# Patient Record
Sex: Female | Born: 1965
Health system: Southern US, Community
[De-identification: ages and names within clinical notes are randomized; demographics above are authoritative.]

## PROBLEM LIST (undated history)

## (undated) DIAGNOSIS — T8859XA Other complications of anesthesia, initial encounter: Secondary | ICD-10-CM

## (undated) DIAGNOSIS — F329 Major depressive disorder, single episode, unspecified: Secondary | ICD-10-CM

## (undated) DIAGNOSIS — F32A Depression, unspecified: Secondary | ICD-10-CM

## (undated) DIAGNOSIS — R519 Headache, unspecified: Secondary | ICD-10-CM

## (undated) DIAGNOSIS — I890 Lymphedema, not elsewhere classified: Secondary | ICD-10-CM

## (undated) DIAGNOSIS — J302 Other seasonal allergic rhinitis: Secondary | ICD-10-CM

## (undated) DIAGNOSIS — M1990 Unspecified osteoarthritis, unspecified site: Secondary | ICD-10-CM

## (undated) DIAGNOSIS — E119 Type 2 diabetes mellitus without complications: Secondary | ICD-10-CM

## (undated) DIAGNOSIS — T07XXXA Unspecified multiple injuries, initial encounter: Secondary | ICD-10-CM

## (undated) HISTORY — PX: TONSILLECTOMY: SUR1361

## (undated) HISTORY — PX: OTHER SURGICAL HISTORY: SHX169

## (undated) HISTORY — PX: WISDOM TOOTH EXTRACTION: SHX21

---

## 1996-06-22 HISTORY — PX: OTHER SURGICAL HISTORY: SHX169

## 1998-12-19 ENCOUNTER — Other Ambulatory Visit: Admission: RE | Admit: 1998-12-19 | Discharge: 1998-12-19 | Payer: Self-pay | Admitting: Internal Medicine

## 2001-03-16 ENCOUNTER — Other Ambulatory Visit: Admission: RE | Admit: 2001-03-16 | Discharge: 2001-03-16 | Payer: Self-pay | Admitting: *Deleted

## 2002-03-23 ENCOUNTER — Other Ambulatory Visit: Admission: RE | Admit: 2002-03-23 | Discharge: 2002-03-23 | Payer: Self-pay | Admitting: Obstetrics and Gynecology

## 2002-05-23 ENCOUNTER — Encounter: Payer: Self-pay | Admitting: Internal Medicine

## 2002-05-23 ENCOUNTER — Encounter: Admission: RE | Admit: 2002-05-23 | Discharge: 2002-05-23 | Payer: Self-pay | Admitting: Internal Medicine

## 2003-07-03 ENCOUNTER — Other Ambulatory Visit: Admission: RE | Admit: 2003-07-03 | Discharge: 2003-07-03 | Payer: Self-pay | Admitting: Obstetrics and Gynecology

## 2003-07-20 ENCOUNTER — Encounter: Admission: RE | Admit: 2003-07-20 | Discharge: 2003-07-20 | Payer: Self-pay | Admitting: Internal Medicine

## 2004-04-25 ENCOUNTER — Ambulatory Visit (HOSPITAL_COMMUNITY): Admission: RE | Admit: 2004-04-25 | Discharge: 2004-04-25 | Payer: Self-pay | Admitting: Surgery

## 2010-11-11 ENCOUNTER — Other Ambulatory Visit: Payer: Self-pay | Admitting: Obstetrics and Gynecology

## 2010-11-18 ENCOUNTER — Other Ambulatory Visit: Payer: Self-pay | Admitting: Obstetrics and Gynecology

## 2010-11-18 DIAGNOSIS — N938 Other specified abnormal uterine and vaginal bleeding: Secondary | ICD-10-CM

## 2010-11-18 DIAGNOSIS — D219 Benign neoplasm of connective and other soft tissue, unspecified: Secondary | ICD-10-CM

## 2010-11-22 ENCOUNTER — Other Ambulatory Visit: Payer: Self-pay

## 2010-11-25 ENCOUNTER — Ambulatory Visit
Admission: RE | Admit: 2010-11-25 | Discharge: 2010-11-25 | Disposition: A | Discharge status: Home / Self Care | Payer: 59 | Source: Ambulatory Visit | Attending: Obstetrics and Gynecology | Admitting: Obstetrics and Gynecology

## 2010-11-25 DIAGNOSIS — N938 Other specified abnormal uterine and vaginal bleeding: Secondary | ICD-10-CM

## 2010-11-25 DIAGNOSIS — D219 Benign neoplasm of connective and other soft tissue, unspecified: Secondary | ICD-10-CM

## 2010-11-25 MED ORDER — GADOBENATE DIMEGLUMINE 529 MG/ML IV SOLN
20.0000 mL | Freq: Once | INTRAVENOUS | Status: AC | PRN
  Administered 2010-11-25: 20 mL via INTRAVENOUS

## 2011-07-29 ENCOUNTER — Encounter (HOSPITAL_COMMUNITY): Payer: Self-pay | Admitting: Pharmacist

## 2011-07-31 ENCOUNTER — Other Ambulatory Visit: Payer: Self-pay | Admitting: Obstetrics and Gynecology

## 2011-08-04 ENCOUNTER — Encounter (HOSPITAL_COMMUNITY): Payer: Self-pay

## 2011-08-04 ENCOUNTER — Encounter (HOSPITAL_COMMUNITY)
Admission: RE | Admit: 2011-08-04 | Discharge: 2011-08-04 | Disposition: A | Discharge status: Home / Self Care | Payer: 59 | Source: Ambulatory Visit | Attending: Obstetrics and Gynecology | Admitting: Obstetrics and Gynecology

## 2011-08-04 HISTORY — DX: Unspecified osteoarthritis, unspecified site: M19.90

## 2011-08-04 HISTORY — DX: Major depressive disorder, single episode, unspecified: F32.9

## 2011-08-04 HISTORY — DX: Depression, unspecified: F32.A

## 2011-08-04 LAB — BASIC METABOLIC PANEL
BUN: 13 mg/dL (ref 6–23)
CO2: 29 mEq/L (ref 19–32)
Chloride: 102 mEq/L (ref 96–112)
Creatinine, Ser: 0.78 mg/dL (ref 0.50–1.10)
Glucose, Bld: 105 mg/dL — ABNORMAL HIGH (ref 70–99)

## 2011-08-04 LAB — CBC
HCT: 39.2 % (ref 36.0–46.0)
MCHC: 30.6 g/dL (ref 30.0–36.0)
MCV: 91 fL (ref 78.0–100.0)
RDW: 14.5 % (ref 11.5–15.5)

## 2011-08-04 NOTE — Patient Instructions (Addendum)
   Your procedure is scheduled on: Tuesday, Feb 19th  Enter through the [employer] of [facility] at: [employer] up the phone at the desk and dial [phone] and inform us of your arrival.  Please call this number if you have any problems the morning of surgery: [phone]  Remember: Do not eat food after midnight: Monday Do not drink clear liquids after: Monday Take these medicines the morning of surgery with a SIP OF WATER: None  Do not wear jewelry, make-up, or FINGER nail polish Do not wear lotions, powders, perfumes or deodorant. Do not shave 48 hours prior to surgery. Do not bring valuables to the hospital.  Leave suitcase in the car. After Surgery it may be brought to your room. For patients being admitted to the hospital, checkout time is 11:00am the day of discharge.  Home with Aunt Deggie Galbreath  cell [phone]  Patients discharged on the day of surgery will not be allowed to drive home.     Remember to use your hibiclens as instructed.Please shower with 1/2 bottle the evening before your surgery and the other 1/2 bottle the morning of surgery.

## 2011-08-04 NOTE — Anesthesia Preprocedure Evaluation (Signed)
Anesthesia Evaluation  Patient identified by MRN, date of birth, ID band Patient awake    Reviewed: Allergy & Precautions, H&P , NPO status , Patient's Chart, lab work & pertinent test results  Airway Mallampati: II TM Distance: >3 FB Neck ROM: full    Dental No notable dental hx. (+) Teeth Intact   Pulmonary neg pulmonary ROS,    Pulmonary exam normal       Cardiovascular neg cardio ROS     Neuro/Psych PSYCHIATRIC DISORDERS Depression Negative Neurological ROS     GI/Hepatic negative GI ROS, Neg liver ROS,   Endo/Other  Morbid obesity- STOP-BANG, No OSA  Renal/GU negative Renal ROS  Genitourinary negative   Musculoskeletal negative musculoskeletal ROS (+)   Abdominal (+) obese,   Peds negative pediatric ROS (+)  Hematology negative hematology ROS (+)   Anesthesia Other Findings   Reproductive/Obstetrics negative OB ROS                           Anesthesia Physical Anesthesia Plan  ASA: III  Anesthesia Plan: General   Post-op Pain Management:    Induction: Intravenous  Airway Management Planned: Oral ETT  Additional Equipment:   Intra-op Plan:   Post-operative Plan: Extubation in OR and Possible Post-op intubation/ventilation  Informed Consent: I have reviewed the patients History and Physical, chart, labs and discussed the procedure including the risks, benefits and alternatives for the proposed anesthesia with the patient or authorized representative who has indicated his/her understanding and acceptance.   Dental Advisory Given  Plan Discussed with: CRNA  Anesthesia Plan Comments: (1. Case was discussed with Dr. Malen Gauze. 2. Risk of postoperative visual loss and intubation was discussed with patient and APSF consensus conclusions on POVL was faxed to Dr. Cherly Hensen 3. Patient had been told by Dr. Cherly Hensen that her surgery may not able to be completed as contemplated. I reiterated  this by saying ventilation may not be possible given her weight.)        Anesthesia Quick Evaluation

## 2011-08-04 NOTE — Pre-Procedure Instructions (Signed)
Reviewed patient's history with Dr Arby Barrette (BMI 64).  Dr Arby Barrette to see patient.

## 2011-08-10 MED ORDER — CEFAZOLIN SODIUM-DEXTROSE 2-3 GM-% IV SOLR
2.0000 g | INTRAVENOUS | Status: AC
  Administered 2011-08-11: 2 g via INTRAVENOUS
  Filled 2011-08-10: qty 50

## 2011-08-11 ENCOUNTER — Encounter (HOSPITAL_COMMUNITY): Payer: Self-pay | Admitting: Anesthesiology

## 2011-08-11 ENCOUNTER — Encounter (HOSPITAL_COMMUNITY)
Admission: RE | Disposition: A | Discharge status: Home / Self Care | Payer: Self-pay | Source: Ambulatory Visit | Attending: Obstetrics and Gynecology

## 2011-08-11 ENCOUNTER — Ambulatory Visit (HOSPITAL_COMMUNITY)
Admission: RE | Admit: 2011-08-11 | Discharge: 2011-08-13 | Disposition: A | Discharge status: Home / Self Care | Payer: 59 | Source: Ambulatory Visit | Attending: Obstetrics and Gynecology | Admitting: Obstetrics and Gynecology

## 2011-08-11 ENCOUNTER — Other Ambulatory Visit: Payer: Self-pay | Admitting: Obstetrics and Gynecology

## 2011-08-11 ENCOUNTER — Inpatient Hospital Stay (HOSPITAL_COMMUNITY): Payer: 59 | Admitting: Anesthesiology

## 2011-08-11 ENCOUNTER — Encounter (HOSPITAL_COMMUNITY): Payer: Self-pay | Admitting: General Surgery

## 2011-08-11 DIAGNOSIS — Z90710 Acquired absence of both cervix and uterus: Secondary | ICD-10-CM

## 2011-08-11 DIAGNOSIS — M1610 Unilateral primary osteoarthritis, unspecified hip: Secondary | ICD-10-CM | POA: Insufficient documentation

## 2011-08-11 DIAGNOSIS — N920 Excessive and frequent menstruation with regular cycle: Secondary | ICD-10-CM | POA: Insufficient documentation

## 2011-08-11 DIAGNOSIS — D259 Leiomyoma of uterus, unspecified: Secondary | ICD-10-CM | POA: Insufficient documentation

## 2011-08-11 DIAGNOSIS — N83209 Unspecified ovarian cyst, unspecified side: Secondary | ICD-10-CM | POA: Insufficient documentation

## 2011-08-11 DIAGNOSIS — M1710 Unilateral primary osteoarthritis, unspecified knee: Secondary | ICD-10-CM | POA: Insufficient documentation

## 2011-08-11 DIAGNOSIS — Z01812 Encounter for preprocedural laboratory examination: Secondary | ICD-10-CM | POA: Insufficient documentation

## 2011-08-11 DIAGNOSIS — M79609 Pain in unspecified limb: Secondary | ICD-10-CM | POA: Insufficient documentation

## 2011-08-11 DIAGNOSIS — M7989 Other specified soft tissue disorders: Secondary | ICD-10-CM

## 2011-08-11 DIAGNOSIS — R209 Unspecified disturbances of skin sensation: Secondary | ICD-10-CM | POA: Insufficient documentation

## 2011-08-11 HISTORY — PX: OVARIAN CYST REMOVAL: SHX89

## 2011-08-11 SURGERY — ROBOTIC ASSISTED TOTAL HYSTERECTOMY
Anesthesia: General | Site: Abdomen | Wound class: Clean Contaminated

## 2011-08-11 MED ORDER — SODIUM CHLORIDE 0.9 % IJ SOLN
INTRAMUSCULAR | Status: DC | PRN
  Administered 2011-08-11: 10 mL via INTRAVENOUS

## 2011-08-11 MED ORDER — DIAZEPAM 5 MG/ML IJ SOLN
5.0000 mg | Freq: Once | INTRAMUSCULAR | Status: AC
  Administered 2011-08-11: 2.5 mg via INTRAVENOUS

## 2011-08-11 MED ORDER — KETOROLAC TROMETHAMINE 30 MG/ML IJ SOLN
INTRAMUSCULAR | Status: DC | PRN
  Administered 2011-08-11: 30 mg via INTRAMUSCULAR

## 2011-08-11 MED ORDER — MIDAZOLAM HCL 5 MG/5ML IJ SOLN
INTRAMUSCULAR | Status: DC | PRN
  Administered 2011-08-11: 2 mg via INTRAVENOUS

## 2011-08-11 MED ORDER — ONDANSETRON HCL 4 MG/2ML IJ SOLN: 4.0000 mg | Freq: Four times a day (QID) | INTRAMUSCULAR | Status: DC | PRN

## 2011-08-11 MED ORDER — DEXTROSE IN LACTATED RINGERS 5 % IV SOLN
INTRAVENOUS | Status: DC
  Administered 2011-08-12: 04:00:00 via INTRAVENOUS

## 2011-08-11 MED ORDER — METOCLOPRAMIDE HCL 5 MG/ML IJ SOLN: 10.0000 mg | Freq: Once | INTRAMUSCULAR | Status: DC | PRN

## 2011-08-11 MED ORDER — LIDOCAINE HCL (CARDIAC) 20 MG/ML IV SOLN
INTRAVENOUS | Status: DC | PRN
  Administered 2011-08-11: 100 mg via INTRAVENOUS

## 2011-08-11 MED ORDER — ROCURONIUM BROMIDE 50 MG/5ML IV SOLN
INTRAVENOUS | Status: AC
  Filled 2011-08-11: qty 2

## 2011-08-11 MED ORDER — PROPOFOL 10 MG/ML IV EMUL
INTRAVENOUS | Status: DC | PRN
  Administered 2011-08-11: 100 mg via INTRAVENOUS
  Administered 2011-08-11: 300 mg via INTRAVENOUS

## 2011-08-11 MED ORDER — HYDROMORPHONE HCL PF 1 MG/ML IJ SOLN
INTRAMUSCULAR | Status: AC
  Administered 2011-08-11: 0.5 mg via INTRAVENOUS
  Filled 2011-08-11: qty 1

## 2011-08-11 MED ORDER — ONDANSETRON HCL 4 MG/2ML IJ SOLN
INTRAMUSCULAR | Status: AC
  Filled 2011-08-11: qty 2

## 2011-08-11 MED ORDER — ONDANSETRON HCL 4 MG PO TABS: 4.0000 mg | ORAL_TABLET | Freq: Four times a day (QID) | ORAL | Status: DC | PRN

## 2011-08-11 MED ORDER — KETOROLAC TROMETHAMINE 30 MG/ML IJ SOLN: 30.0000 mg | Freq: Four times a day (QID) | INTRAMUSCULAR | Status: DC

## 2011-08-11 MED ORDER — PANTOPRAZOLE SODIUM 40 MG PO TBEC
40.0000 mg | DELAYED_RELEASE_TABLET | Freq: Every day | ORAL | Status: DC
  Filled 2011-08-11 (×3): qty 1

## 2011-08-11 MED ORDER — FENTANYL CITRATE 0.05 MG/ML IJ SOLN
INTRAMUSCULAR | Status: AC
  Administered 2011-08-11: 50 ug via INTRAVENOUS
  Filled 2011-08-11: qty 2

## 2011-08-11 MED ORDER — HYDROMORPHONE 0.3 MG/ML IV SOLN
INTRAVENOUS | Status: DC
  Administered 2011-08-11: 1.79 mg via INTRAVENOUS
  Administered 2011-08-11: 15:00:00 via INTRAVENOUS
  Administered 2011-08-12: 3.19 mg via INTRAVENOUS
  Administered 2011-08-12: 2.59 mg via INTRAVENOUS

## 2011-08-11 MED ORDER — FENTANYL CITRATE 0.05 MG/ML IJ SOLN
INTRAMUSCULAR | Status: AC
  Filled 2011-08-11: qty 2

## 2011-08-11 MED ORDER — ONDANSETRON HCL 4 MG/2ML IJ SOLN
INTRAMUSCULAR | Status: DC | PRN
  Administered 2011-08-11: 4 mg via INTRAVENOUS

## 2011-08-11 MED ORDER — MIDAZOLAM HCL 2 MG/2ML IJ SOLN: 0.5000 mg | INTRAMUSCULAR | Status: DC | PRN

## 2011-08-11 MED ORDER — HYDROMORPHONE HCL PF 1 MG/ML IJ SOLN: 0.2000 mg | INTRAMUSCULAR | Status: DC | PRN

## 2011-08-11 MED ORDER — DIPHENHYDRAMINE HCL 50 MG/ML IJ SOLN: 12.5000 mg | Freq: Four times a day (QID) | INTRAMUSCULAR | Status: DC | PRN

## 2011-08-11 MED ORDER — ZOLPIDEM TARTRATE 5 MG PO TABS: 5.0000 mg | ORAL_TABLET | Freq: Every evening | ORAL | Status: DC | PRN

## 2011-08-11 MED ORDER — IBUPROFEN 800 MG PO TABS
800.0000 mg | ORAL_TABLET | Freq: Three times a day (TID) | ORAL | Status: DC | PRN
  Administered 2011-08-12: 800 mg via ORAL
  Filled 2011-08-11: qty 1

## 2011-08-11 MED ORDER — CEFAZOLIN SODIUM 1-5 GM-% IV SOLN
1.0000 g | Freq: Three times a day (TID) | INTRAVENOUS | Status: AC
  Administered 2011-08-11 (×2): 1 g via INTRAVENOUS
  Filled 2011-08-11 (×2): qty 50

## 2011-08-11 MED ORDER — FENTANYL CITRATE 0.05 MG/ML IJ SOLN
INTRAMUSCULAR | Status: AC
  Filled 2011-08-11: qty 5

## 2011-08-11 MED ORDER — KETOROLAC TROMETHAMINE 30 MG/ML IJ SOLN
INTRAMUSCULAR | Status: AC
  Filled 2011-08-11: qty 2

## 2011-08-11 MED ORDER — PHENYLEPHRINE HCL 10 MG/ML IJ SOLN
INTRAMUSCULAR | Status: DC | PRN
  Administered 2011-08-11: .1 mg via INTRAVENOUS
  Administered 2011-08-11: 0.2 mg via INTRAVENOUS

## 2011-08-11 MED ORDER — DIPHENHYDRAMINE HCL 12.5 MG/5ML PO ELIX: 12.5000 mg | ORAL_SOLUTION | Freq: Four times a day (QID) | ORAL | Status: DC | PRN

## 2011-08-11 MED ORDER — NEOSTIGMINE METHYLSULFATE 1 MG/ML IJ SOLN
INTRAMUSCULAR | Status: AC
  Filled 2011-08-11: qty 10

## 2011-08-11 MED ORDER — DEXAMETHASONE SODIUM PHOSPHATE 4 MG/ML IJ SOLN
INTRAMUSCULAR | Status: DC | PRN
  Administered 2011-08-11: 10 mg via INTRAVENOUS

## 2011-08-11 MED ORDER — MIDAZOLAM HCL 2 MG/2ML IJ SOLN
INTRAMUSCULAR | Status: AC
  Filled 2011-08-11: qty 2

## 2011-08-11 MED ORDER — BUPIVACAINE HCL (PF) 0.25 % IJ SOLN
INTRAMUSCULAR | Status: DC | PRN
  Administered 2011-08-11: 20 mL

## 2011-08-11 MED ORDER — DEXAMETHASONE SODIUM PHOSPHATE 10 MG/ML IJ SOLN
INTRAMUSCULAR | Status: AC
  Filled 2011-08-11: qty 1

## 2011-08-11 MED ORDER — NALOXONE HCL 0.4 MG/ML IJ SOLN: 0.4000 mg | INTRAMUSCULAR | Status: DC | PRN

## 2011-08-11 MED ORDER — GLYCOPYRROLATE 0.2 MG/ML IJ SOLN
INTRAMUSCULAR | Status: DC | PRN
  Administered 2011-08-11: .8 mg via INTRAVENOUS

## 2011-08-11 MED ORDER — GLYCOPYRROLATE 0.2 MG/ML IJ SOLN
INTRAMUSCULAR | Status: AC
  Filled 2011-08-11: qty 1

## 2011-08-11 MED ORDER — FAMOTIDINE 20 MG PO TABS
ORAL_TABLET | ORAL | Status: AC
  Filled 2011-08-11: qty 1

## 2011-08-11 MED ORDER — MENTHOL 3 MG MT LOZG: 1.0000 | LOZENGE | OROMUCOSAL | Status: DC | PRN

## 2011-08-11 MED ORDER — KETOROLAC TROMETHAMINE 30 MG/ML IJ SOLN
30.0000 mg | Freq: Four times a day (QID) | INTRAMUSCULAR | Status: DC
  Administered 2011-08-11 – 2011-08-12 (×2): 30 mg via INTRAVENOUS
  Filled 2011-08-11 (×2): qty 1

## 2011-08-11 MED ORDER — FAMOTIDINE 20 MG PO TABS
20.0000 mg | ORAL_TABLET | Freq: Once | ORAL | Status: AC
  Administered 2011-08-11: 20 mg via ORAL

## 2011-08-11 MED ORDER — FENTANYL CITRATE 0.05 MG/ML IJ SOLN
25.0000 ug | INTRAMUSCULAR | Status: DC | PRN
  Administered 2011-08-11 (×5): 50 ug via INTRAVENOUS

## 2011-08-11 MED ORDER — FENTANYL CITRATE 0.05 MG/ML IJ SOLN
INTRAMUSCULAR | Status: DC | PRN
  Administered 2011-08-11 (×3): 50 ug via INTRAVENOUS
  Administered 2011-08-11: 150 ug via INTRAVENOUS
  Administered 2011-08-11 (×2): 50 ug via INTRAVENOUS

## 2011-08-11 MED ORDER — HYDROMORPHONE HCL PF 1 MG/ML IJ SOLN
0.2500 mg | INTRAMUSCULAR | Status: DC | PRN
  Administered 2011-08-11 (×4): 0.5 mg via INTRAVENOUS

## 2011-08-11 MED ORDER — ROCURONIUM BROMIDE 100 MG/10ML IV SOLN
INTRAVENOUS | Status: DC | PRN
  Administered 2011-08-11: 20 mg via INTRAVENOUS
  Administered 2011-08-11: 60 mg via INTRAVENOUS

## 2011-08-11 MED ORDER — LACTATED RINGERS IV SOLN
INTRAVENOUS | Status: DC
  Administered 2011-08-11: 50 mL/h via INTRAVENOUS

## 2011-08-11 MED ORDER — LACTATED RINGERS IV SOLN
INTRAVENOUS | Status: DC | PRN
  Administered 2011-08-11: 07:00:00 via INTRAVENOUS

## 2011-08-11 MED ORDER — SODIUM CHLORIDE 0.9 % IJ SOLN: 9.0000 mL | INTRAMUSCULAR | Status: DC | PRN

## 2011-08-11 MED ORDER — NEOSTIGMINE METHYLSULFATE 1 MG/ML IJ SOLN
INTRAMUSCULAR | Status: DC | PRN
  Administered 2011-08-11: 4 mg via INTRAVENOUS

## 2011-08-11 MED ORDER — BUPIVACAINE HCL (PF) 0.25 % IJ SOLN
INTRAMUSCULAR | Status: AC
  Filled 2011-08-11: qty 30

## 2011-08-11 MED ORDER — EPHEDRINE 5 MG/ML INJ
INTRAVENOUS | Status: AC
  Filled 2011-08-11: qty 10

## 2011-08-11 MED ORDER — OXYCODONE-ACETAMINOPHEN 5-325 MG PO TABS
1.0000 | ORAL_TABLET | ORAL | Status: DC | PRN
  Administered 2011-08-12 – 2011-08-13 (×5): 2 via ORAL
  Filled 2011-08-11 (×5): qty 2

## 2011-08-11 MED ORDER — PHENYLEPHRINE 40 MCG/ML (10ML) SYRINGE FOR IV PUSH (FOR BLOOD PRESSURE SUPPORT)
PREFILLED_SYRINGE | INTRAVENOUS | Status: AC
  Filled 2011-08-11: qty 10

## 2011-08-11 MED ORDER — LIDOCAINE HCL (CARDIAC) 20 MG/ML IV SOLN
INTRAVENOUS | Status: AC
  Filled 2011-08-11: qty 5

## 2011-08-11 MED ORDER — HYDROMORPHONE 0.3 MG/ML IV SOLN
INTRAVENOUS | Status: AC
  Filled 2011-08-11: qty 25

## 2011-08-11 MED ORDER — EPHEDRINE SULFATE 50 MG/ML IJ SOLN
INTRAMUSCULAR | Status: DC | PRN
  Administered 2011-08-11: 20 mg via INTRAVENOUS
  Administered 2011-08-11: 10 mg via INTRAVENOUS

## 2011-08-11 MED ORDER — LACTATED RINGERS IR SOLN
Status: DC | PRN
  Administered 2011-08-11: 3000 mL

## 2011-08-11 MED ORDER — ACETAMINOPHEN 10 MG/ML IV SOLN
1000.0000 mg | Freq: Once | INTRAVENOUS | Status: AC
  Administered 2011-08-11: 1000 mg via INTRAVENOUS
  Filled 2011-08-11: qty 100

## 2011-08-11 SURGICAL SUPPLY — 65 items
ADH SKN CLS APL DERMABOND .7 (GAUZE/BANDAGES/DRESSINGS) ×8
BAG URINE DRAINAGE (UROLOGICAL SUPPLIES) ×5 IMPLANT
BARRIER ADHS 3X4 INTERCEED (GAUZE/BANDAGES/DRESSINGS) ×5 IMPLANT
BLADE LAP MORCELLATOR 15X9.5 (ELECTROSURGICAL) ×2 IMPLANT
BRR ADH 4X3 ABS CNTRL BYND (GAUZE/BANDAGES/DRESSINGS) ×4
CABLE HIGH FREQUENCY MONO STRZ (ELECTRODE) ×5 IMPLANT
CATH FOLEY 3WAY  5CC 16FR (CATHETERS) ×1
CATH FOLEY 3WAY 5CC 16FR (CATHETERS) ×4 IMPLANT
CHLORAPREP W/TINT 26ML (MISCELLANEOUS) ×7 IMPLANT
CLOTH BEACON ORANGE TIMEOUT ST (SAFETY) ×5 IMPLANT
CONT PATH 16OZ SNAP LID 3702 (MISCELLANEOUS) ×5 IMPLANT
COVER MAYO STAND STRL (DRAPES) ×5 IMPLANT
COVER TABLE BACK 60X90 (DRAPES) ×10 IMPLANT
COVER TIP SHEARS 8 DVNC (MISCELLANEOUS) ×4 IMPLANT
COVER TIP SHEARS 8MM DA VINCI (MISCELLANEOUS) ×1
DECANTER SPIKE VIAL GLASS SM (MISCELLANEOUS) ×5 IMPLANT
DERMABOND ADVANCED (GAUZE/BANDAGES/DRESSINGS) ×2
DERMABOND ADVANCED .7 DNX12 (GAUZE/BANDAGES/DRESSINGS) ×5 IMPLANT
DRAPE HUG U DISPOSABLE (DRAPE) ×5 IMPLANT
DRAPE LG THREE QUARTER DISP (DRAPES) ×10 IMPLANT
DRAPE MONITOR DA VINCI (DRAPE) IMPLANT
DRAPE WARM FLUID 44X44 (DRAPE) ×5 IMPLANT
DRESSING TELFA 8X3 (GAUZE/BANDAGES/DRESSINGS) ×2 IMPLANT
ELECT REM PT RETURN 9FT ADLT (ELECTROSURGICAL) ×5
ELECTRODE REM PT RTRN 9FT ADLT (ELECTROSURGICAL) ×4 IMPLANT
EVACUATOR SMOKE 8.L (FILTER) ×5 IMPLANT
GAUZE VASELINE 3X9 (GAUZE/BANDAGES/DRESSINGS) IMPLANT
GLOVE BIO SURGEON STRL SZ 6.5 (GLOVE) ×10 IMPLANT
GLOVE BIOGEL PI IND STRL 7.0 (GLOVE) ×12 IMPLANT
GLOVE BIOGEL PI INDICATOR 7.0 (GLOVE) ×3
GOWN STRL REIN XL XLG (GOWN DISPOSABLE) ×30 IMPLANT
KIT ACCESSORY DA VINCI DISP (KITS) ×1
KIT ACCESSORY DVNC DISP (KITS) ×4 IMPLANT
KIT DISP ACCESSORY 4 ARM (KITS) IMPLANT
NDL INSUFFLATION 14GA 120MM (NEEDLE) ×3 IMPLANT
NEEDLE INSUFFLATION 14GA 120MM (NEEDLE) ×5 IMPLANT
NS IRRIG 1000ML POUR BTL (IV SOLUTION) ×15 IMPLANT
OCCLUDER COLPOPNEUMO (BALLOONS) ×2 IMPLANT
PACK LAVH (CUSTOM PROCEDURE TRAY) ×5 IMPLANT
PAD PREP 24X48 CUFFED NSTRL (MISCELLANEOUS) ×10 IMPLANT
PLUG CATH AND CAP STER (CATHETERS) ×5 IMPLANT
PROTECTOR NERVE ULNAR (MISCELLANEOUS) ×12 IMPLANT
SCISSORS LAP 5X35 DISP (ENDOMECHANICALS) IMPLANT
SET IRRIG TUBING LAPAROSCOPIC (IRRIGATION / IRRIGATOR) ×5 IMPLANT
SOLUTION ELECTROLUBE (MISCELLANEOUS) ×5 IMPLANT
SPONGE LAP 18X18 X RAY DECT (DISPOSABLE) ×2 IMPLANT
SUT VIC AB 0 CT1 27 (SUTURE) ×70
SUT VIC AB 0 CT1 27XBRD ANTBC (SUTURE) ×29 IMPLANT
SUT VICRYL 0 UR6 27IN ABS (SUTURE) ×7 IMPLANT
SUT VICRYL 4-0 PS2 18IN ABS (SUTURE) ×10 IMPLANT
SYR 50ML LL SCALE MARK (SYRINGE) ×5 IMPLANT
SYSTEM CONVERTIBLE TROCAR (TROCAR) IMPLANT
TIP UTERINE 5.1X6CM LAV DISP (MISCELLANEOUS) IMPLANT
TIP UTERINE 6.7X10CM GRN DISP (MISCELLANEOUS) IMPLANT
TIP UTERINE 6.7X6CM WHT DISP (MISCELLANEOUS) IMPLANT
TIP UTERINE 6.7X8CM BLUE DISP (MISCELLANEOUS) ×2 IMPLANT
TOWEL OR 17X24 6PK STRL BLUE (TOWEL DISPOSABLE) ×15 IMPLANT
TRAY FOLEY BAG SILVER LF 14FR (CATHETERS) ×5 IMPLANT
TROCAR 12M 150ML BLUNT (TROCAR) ×2 IMPLANT
TROCAR DISP BLADELESS 8 DVNC (TROCAR) ×4 IMPLANT
TROCAR DISP BLADELESS 8MM (TROCAR) ×1
TROCAR Z-THREAD 12X150 (TROCAR) ×5 IMPLANT
TROCAR Z-THREAD BLADED 12X100M (TROCAR) ×5 IMPLANT
TUBING FILTER THERMOFLATOR (ELECTROSURGICAL) ×5 IMPLANT
WARMER LAPAROSCOPE (MISCELLANEOUS) ×5 IMPLANT

## 2011-08-11 NOTE — Progress Notes (Signed)
Pt has eaten C/o numbness of both feet and legs sore. Dopplers normal  O: morbidly obese BF in NAD Foley : drainage of blood(light) urine noted.. Yellow urine at end of case  Intraop findings reviewed  IMP: Postop Davinci robotic hysterectomy Numbness of feet expect transient P) monitor urine closely. Await BMET in am. Will do retrograde eval if necessary. Reg postop care

## 2011-08-11 NOTE — Transfer of Care (Signed)
Immediate Anesthesia Transfer of Care Note  Patient: [name]  Procedure(s) Performed: Procedure(s) (LRB): ROBOTIC ASSISTED TOTAL HYSTERECTOMY (N/A) OVARIAN CYSTECTOMY (Left) BILATERAL SALPINGECTOMY (Bilateral)  Patient Location: PACU  Anesthesia Type: General  Level of Consciousness: awake  Airway & Oxygen Therapy: Patient Spontanous Breathing and Patient connected to nasal cannula oxygen  Post-op Assessment: Report given to PACU RN and Patient moving all extremities  Post vital signs: Reviewed and stable  Complications: No apparent anesthesia complications

## 2011-08-11 NOTE — Brief Op Note (Signed)
08/11/2011  12:17 PM  PATIENT:  [name]  46 y.o. female  PRE-OPERATIVE DIAGNOSIS:  Dysfunctional Uterine Bleeding, Fibroid Uterus, Morbid Obesity  POST-OPERATIVE DIAGNOSIS:  Dysfunctional Uterine Bleeding, Fibroid Uterus, Morbid Obesity  PROCEDURE:  Procedure(s) (LRB):DAVINCI ROBOTIC ASSISTED TOTAL HYSTERECTOMY (N/A)LEFT OVARIAN CYSTECTOMY (Left) BILATERAL SALPINGECTOMY (Bilateral)  SURGEON:  Surgeon(s) and Role:    * Serita Kyle, MD - Primary      PHYSICIAN ASSISTANT:   ASSISTANTS: Shea Evans ANESTHESIA:   general FINDINGS:  NL URETERS PERISTALSING BILATERALLY, FIBROID UTERUS, NL APPENDIX, NL LIVER EDGE, NL TUBES BILAT, LEFT OV CYST, NL RIGHT OV  EBL:  Total I/O In: 400 [I.V.:400] Out: 75 [Urine:75]  BLOOD ADMINISTERED:none  DRAINS: none   LOCAL MEDICATIONS USED:  MARCAINE     SPECIMEN:  Source of Specimen:  UTERUS W/ BILATERAL FALLOPIAN TUBES, LEFT OVARIAN CYST  WALL  DISPOSITION OF SPECIMEN:  PATHOLOGY  COUNTS:  YES  TOURNIQUET:  * No tourniquets in log *  DICTATION: .Other Dictation: Dictation Number  [id number] PLAN OF CARE:   PATIENT DISPOSITION:  PACU - hemodynamically stable.   Delay start of Pharmacological VTE agent (>24hrs) due to surgical blood loss or risk of bleeding: no

## 2011-08-11 NOTE — Progress Notes (Signed)
Bilateral lower extremity venous duplex completed.  Preliminary report is negative for DVT or SVT in the vessels visualized.  Negative for a Baker's cyst.  Technically difficult study due to the patient's body habitus.

## 2011-08-11 NOTE — Anesthesia Postprocedure Evaluation (Signed)
  Anesthesia Post-op Note  Patient: [name]  Procedure(s) Performed: Procedure(s) (LRB): ROBOTIC ASSISTED TOTAL HYSTERECTOMY (N/A) OVARIAN CYSTECTOMY (Left) BILATERAL SALPINGECTOMY (Bilateral)  Patient Location: PACU  Anesthesia Type: General  Level of Consciousness: awake, alert  and oriented  Airway and Oxygen Therapy: Patient Spontanous Breathing  Post-op Pain: mild. Primary complaint has been bilateral calf pain.  Post-op Assessment: Post-op Vital signs reviewed, Patient's Cardiovascular Status Stable, Respiratory Function Stable, Patent Airway, No signs of Nausea or vomiting and Pain level controlled  Post-op Vital Signs: Reviewed and stable  Complications: No apparent anesthesia complications. Bilateral calf pain in PACU. Doppler study preliminary report negative. Could possibly be related to lower back disorder or surgical procedure.

## 2011-08-11 NOTE — Progress Notes (Signed)
Dr. Malen Gauze made aware of pts pain level and bp elevation, orders received for additional medication.  Dopplers performed and results negative per Dois Davenport- vascular sonographer

## 2011-08-12 ENCOUNTER — Encounter (HOSPITAL_COMMUNITY): Payer: Self-pay | Admitting: Obstetrics and Gynecology

## 2011-08-12 LAB — BASIC METABOLIC PANEL
GFR calc Af Amer: 86 mL/min — ABNORMAL LOW (ref >90)
GFR calc non Af Amer: 74 mL/min — ABNORMAL LOW (ref >90)
Potassium: 4.3 mEq/L (ref 3.5–5.1)
Sodium: 136 mEq/L (ref 135–145)

## 2011-08-12 LAB — CBC
Hemoglobin: 12 g/dL (ref 12.0–15.0)
MCHC: 30.9 g/dL (ref 30.0–36.0)
Platelets: 241 10*3/uL (ref 150–400)
RDW: 15 % (ref 11.5–15.5)

## 2011-08-12 MED ORDER — HYDROCODONE-ACETAMINOPHEN 5-500 MG PO TABS: 1.0000 | ORAL_TABLET | ORAL | Status: DC | PRN

## 2011-08-12 MED ORDER — LACTATED RINGERS IV BOLUS (SEPSIS)
1000.0000 mL | Freq: Once | INTRAVENOUS | Status: AC
  Administered 2011-08-12: 1000 mL via INTRAVENOUS

## 2011-08-12 MED ORDER — IBUPROFEN 800 MG PO TABS: 800.0000 mg | ORAL_TABLET | Freq: Three times a day (TID) | ORAL | Status: AC | PRN

## 2011-08-12 NOTE — Progress Notes (Signed)
Spoke to rehab at [facility], states they will not be able to see patient today. Notified MD. Charge nurse notified cmgmt to see if pt can get a walker for home today. Will f/u

## 2011-08-12 NOTE — Discharge Instructions (Signed)
Call if temperature greater than equal to 100.4, nothing per vagina for 4-6 weeks or severe nausea vomiting, increased incisional pain , drainage or redness in the incision site, no straining with bowel movements, showers no bath °

## 2011-08-12 NOTE — Consult Note (Signed)
[facility] CONSULT [facility]     Reason for Consult: bilateral feet numbness    HPI:    [name] is an 46 y.o. female who recently underwent a THOCBS. Per OP note, prior to operation patient was complaining of bilateral calf pain.  Bilateral LE dopplers showed negative for DVT.  Post op day 1 patient continued to complain of bilateral foot tingling. Her paresthesia is  Subjectively located over the bottom of her feet bilaterally however, objectively during exam she shows a sensory S1 (lateralfoot and small toe only) with preserved achilles reflex.  Past Medical History  Diagnosis Date  . Arthritis     knees, hips, tx with otc med  . Depression     History in 2004, no current prob, no meds    Past Surgical History  Procedure Date  . Excision of 4 areas of hidradenitis in the right axilla  1986 x 04/25/2004    x 2  . Tonsillectomy     as 46 yrs old  . Arthroscopic knee 1998    right knee  . Wisdom tooth extraction     History reviewed. No pertinent family history.  Social History:  reports that she has been passively smoking Cigars.  She has never used smokeless tobacco. She reports that she drinks alcohol. She reports that she does not use illicit drugs.  No Known Allergies  Medications:    Prior to Admission:  Prescriptions prior to admission  Medication Sig Dispense Refill  . DISCONTD: HYDROcodone-acetaminophen (VICODIN) 5-500 MG per tablet Take 1 tablet by mouth every 4 (four) hours as needed.      Marland Kitchen DISCONTD: ibuprofen (ADVIL,MOTRIN) 200 MG tablet Take 1,200 mg by mouth 2 (two) times daily. For pain.      Marland Kitchen DISCONTD: levonorgestrel-ethinyl estradiol (AVIANE,ALESSE,LESSINA) 0.1-20 MG-MCG tablet Take 1 tablet by mouth daily.      Marland Kitchen DISCONTD: Menthol, Topical Analgesic, (STOPAIN EX) Apply 1 spray topically daily. For Knee Pain.       Scheduled:   .  ceFAZolin (ANCEF) IV  1 g Intravenous Q8H  . diazepam  5 mg Intravenous Once  .  famotidine      . HYDROmorphone PCA 0.3 mg/mL      . ketorolac  30 mg Intravenous Q6H   Or  . ketorolac  30 mg Intramuscular Q6H  . pantoprazole  40 mg Oral Q1200  . DISCONTD: HYDROmorphone PCA 0.3 mg/mL   Intravenous Q4H    Review of Systems - General ROS: negative for - chills, fatigue, fever or hot flashes Hematological and Lymphatic ROS: negative for - bruising, fatigue, jaundice or pallor Endocrine ROS: negative for - hair pattern changes, hot flashes, mood swings or skin changes Respiratory ROS: negative for - cough, hemoptysis, orthopnea or wheezing Cardiovascular ROS: negative for - dyspnea on exertion, orthopnea, palpitations or shortness of breath Gastrointestinal ROS: negative for - abdominal pain, appetite loss, blood in stools, diarrhea or hematemesis Musculoskeletal ROS: negative for - joint pain, joint stiffness, joint swelling or muscle pain Neurological ROS: positive for - numbness/tingling bilateral feet Dermatological ROS: negative for dry skin, pruritus and rash   Blood pressure 125/84, pulse 100, temperature 98.2 F (36.8 C), temperature source Oral, resp. rate 20, height 5\' 10"  (1.778 m), weight 202.304 kg (446 lb), SpO2 95.00%.   Neurologic Examination:   Mental Status: Alert, oriented, thought content appropriate.  Speech fluent without evidence of aphasia.  Able to follow 3 step commands without difficulty. Cranial Nerves: II: visual fields grossly normal, pupils equal, round, reactive to light and accommodation III,IV, VI: ptosis not present, extraocular muscles extra-ocular motions intact bilaterally V,VII: smile symmetric, facial light touch sensation normal bilaterally VIII: hearing normal bilaterally IX,X: gag reflex present XI: trapezius strength/neck flexion strength normal bilaterally XII: tongue strength normal  Motor: Right : Upper extremity    Left:     Upper extremity 5/5 deltoid       5/5 deltoid 5/5 tricep      5/5 tricep 5/5  biceps      5/5 biceps  5/5wrist flexion     5/5 wrist flexion 5/5 wrist extension     5/5 wrist extension 5/5 hand grip      5/5 hand grip  Lower extremity     Lower extremity 5/5 hip flexor      5/5 hip flexor 5/5 hip adductors     5/5 hip adductors 5/5 hip abductors     5/5 hip abductors 5/5 quadricep      5/5 quadriceps  5/5 hamstrings     5/5 hamstrings 5/5 plantar flexion       5/5 plantar flexion 5/5 plantar extension     5/5 plantar extension  Bilateral plantar eversion, inversion, PF and DF of ankle 5/5  Tone and bulk:normal tone throughout; no atrophy noted Sensory: Pinprick and light touch intact throughout, bilaterally with exception of decreased sensation over the lateral foot bilaterally in the S1 distribution.  Deep Tendon Reflexes: 2+ and symmetric throughout including bilateral achilles reflexes.  Plantars: Right: downgoing   Left: downgoing Cerebellar: normal finger-to-nose, Heel to shin unable due to body habitus  No results found for this basename: cbc, bmp, coags, chol, tri, ldl, hga1c    Results for orders placed during the hospital encounter of 08/11/11 (from the past 48 hour(s))  PREGNANCY, URINE     Status: Normal   Collection Time   08/11/11  6:28 AM      Component Value Range Comment   Preg Test, Ur NEGATIVE  NEGATIVE    CBC     Status: Abnormal   Collection Time   08/12/11  5:20 AM      Component Value Range Comment   WBC 12.5 (*) 4.0 - 10.5 (K/uL)    RBC 4.24  3.87 - 5.11 (MIL/uL)    Hemoglobin 12.0  12.0 - 15.0 (g/dL)    HCT 16.1  09.6 - 04.5 (%)    MCV 91.5  78.0 - 100.0 (fL)    MCH 28.3  26.0 - 34.0 (pg)    MCHC 30.9  30.0 - 36.0 (g/dL)    RDW 40.9  81.1 - 91.4 (%)    Platelets 241  150 - 400 (K/uL)   BASIC METABOLIC PANEL     Status: Abnormal   Collection Time   08/12/11  5:20 AM      Component Value Range Comment   Sodium 136  135 - 145 (mEq/L)    Potassium 4.3  3.5 - 5.1 (mEq/L)    Chloride 103  96 - 112 (mEq/L)    CO2 25  19 - 32  (mEq/L)    Glucose, Bld 150 (*) 70 - 99 (mg/dL)    BUN 15  6 - 23 (mg/dL)    Creatinine, Ser 7.82  0.50 - 1.10 (mg/dL)    Calcium 8.2 (*) 8.4 - 10.5 (mg/dL)    GFR  calc non Af Amer 74 (*) >90 (mL/min)    GFR calc Af Amer 86 (*) >90 (mL/min)     No results found.   Assessment/Plan:   46 YO female with S1 distribution sensory loss bilaterally S/P robotic total hysterectomy.  Patient has preserved motor strength and achilles reflex on exam.  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   Recommendation:  1) open MRI after discharge 2) EMG/NCV out patient neurology in 2 weeks 3) PT to evaluate prior to D/C for safety  No further recommendations:   Felicie Morn PA-C [facility] [phone]  08/12/2011, 10:31 AM

## 2011-08-12 NOTE — Progress Notes (Signed)
1 Day Post-Op Procedure(s) (LRB): ROBOTIC ASSISTED TOTAL HYSTERECTOMY (N/A) OVARIAN CYSTECTOMY (Left) BILATERAL SALPINGECTOMY (Bilateral)  Subjective: Patient reports tolerating PO.   C/o numbness of her feet and legs sore. Dopplers normal Objective: I have reviewed patient's vital signs, intake and output and labs.  General: alert, cooperative and no distress Resp: clear to auscultation bilaterally Cardio: regular rate and rhythm, S1, S2 normal, no murmur, click, rub or gallop GI: incision: clean, dry and well approximated Extremities: edema 1 plus edema. (+) equal pulses Vaginal Bleeding: minimal soft nontender abdomen obese  Assessment: s/p Procedure(s) (LRB):DAVINCI ROBOTIC ASSISTED TOTAL HYSTERECTOMY (N/A), LEFT OVARIAN CYSTECTOMY (Left) BILATERAL SALPINGECTOMY (Bilateral): stable and tolerating diet  Plan: Encourage ambulation d/c foley. D/C HOME D/C INSTRUCTIONS REVIEWED. F/U 2 WK neurol consult prior to d/c  LOS: 1 day    Tykia Mellone A 08/12/2011, 8:50 AM

## 2011-08-12 NOTE — Progress Notes (Signed)
08/12/11 [facility] 7:45 pm #[phone]- nurse care manager received call around 5:15 pm stating patient may have possible Home Health needs and be discharging home tonight. Nurse Care Manager recieved cal around 1800 l from RN on unit stating patient would be going home tonight with an order for PT at home. Nurse care manager came up and spoke to patient and provided patient a list of Home Health Agencies for the [facility]. Patient chose [facility] for agency at discharge.  Patient expressed concern about getting around and needing a bedside commode and also a walker for home use. Nurse Care Manager spoke to Dr Cherly Hensen on unit and MD decided to cancel discharge home tonight and have patient's Physical therapy consult done here while inpatient prior to discharging from hospital.  Plan is to see what inpatient physical therapy consult recommends and then Dr Cherly Hensen will place order for equipment and or PT at home if needed. Please call Nurse Care Manager at [phone] with discharge needs on 08/13/11 so referral can be made with [facility] [place]# [phone]).  After talking with Dr Cherly Hensen no other needs identified at this time. Nurse Care Manager will follow up in am.

## 2011-08-12 NOTE — Progress Notes (Signed)
Pt complains of bilat foot numbness. Pt having trouble getting from sitting to standing. Pt given walker for stability and able to stand and walk to bathroom and back to chair without incident. Will cont to monitor.

## 2011-08-12 NOTE — Op Note (Signed)
NAMEGAVIN, FAIVRE            ACCOUNT NO.:  [id number]  MEDICAL RECORD NO.:  [id number]  LOCATION:  9302                          FACILITY:  [facility]  PHYSICIAN:  Maxie Better, M.D.DATE OF BIRTH:  [birth year]  DATE OF PROCEDURE:  08/11/2011 DATE OF DISCHARGE:                              OPERATIVE REPORT   PREOPERATIVE DIAGNOSES:  Menorrhagia, uterine fibroids, morbid obesity.  POSTOPERATIVE DIAGNOSES:  Menorrhagia, uterine fibroids, left ovarian Cyst, morbid obesity.  PROCEDURES:  Da Vinci robotic total hysterectomy, bilateral salpingectomy, left ovarian cystectomy.  ANESTHESIA:  General.  SURGEON:  Maxie Better, MD  ASSISTANT:  Darryl Nestle, MD  PROCEDURE:  Under adequate general anesthesia, the patient was placed in the dorsal lithotomy position.  She was positioned specifically for robotic surgery.  The patient had a BMI of 64.  The patient was prepped with ChloraPrep and Betadine.  She was subsequently sterilely draped and indwelling Foley catheter was sterilely placed.  Examination under anesthesia revealed enlarged uterus, but limited by the patient's body habitus.  A weighted speculum was placed in the vagina.  Sims retractor was placed anteriorly.  The cervix was then grasped with a single-tooth Tenaculum on the anterior lip.   0 Vicryl figure-of-eight sutures was placed on the anterior and posterior lip of the cervix for traction.  The cervix serially dilated up to a #27 Pratt dilator.  The uterus sounded to 9 cm.  A #8 uterine manipulator with the RUMI cup was introduced into the cervix without incident.  A small RUMI cup was utilized.  After this was done, the weighted speculum was removed as was the retractor. Attention was then turned to the abdomen.  Given the large pannus and an anticipated enlarged uterus, 0.25% Marcaine was injected about 1.5-inch from the umbilicus basically midway between the umbilicus and the xiphoid process.  A vertical  incision was then made.  Veress needle was introduced and tested.  Good placement was felt.  CO2 was insufflated.  However, it came in intermittently and therefore, the Veress needle was removed and reinserted and retested with normal saline.  Subsequently, 3 liters of CO2 was insufflated.  The Veress needle was then removed.  A 12-mm disposable trocar with sleeve was introduced into the abdomen without incident.  The robotic camera was then placed through that port.  It was then noted that there was some degree of the carbon dioxide that had entered into the omentum. Nonetheless, the upper abdomen was seen and liver edge was normal.  Due to the bowels and omentum, the pelvis was not readily seen.  The uterine manipulation was done and just a small tip of the uterus was noted.  At that point, on the left-hand side, again staying high on the abdomen, two 8-mm ports were subsequently placed after localizing with Marcaine and one 8-mm port site was placed on the right with a 12-mm disposable trocar was placed in the right lower quadrant for the assistant port Site. The blunt probe was then used to evaluate the pelvis.  There was a large fundal fibroid, nl tubes and right ovary, left ovary contained ovarian cyst.  Once these was placed well, the robot was docked from the left side.  Subsequently, the Prograsp, the PK dissector and monopolar scissors were placed in #3, #2 and #1 respectively.Once these was placed well, the robot was docked from the left.  Once everything was in place, I went to the surgical console.  There, the uterus was manipulated such that the right ureter was noted to be peristalsing.  The right fallopian tube was then grasped and the mesosalpinx was then serially cauterized and cut followed by the round ligament, which was cauterized its sequential location and then cut.  The right utero-ovarian ligament was then serially clamped, cauterized, and then cut.  The  anterior-posterior leaf of the broad ligament was then opened.  Large prominent right uterine vessels were noted.  The anterior leaf of the bladder was then opened anteriorly and partially dissected and displaced inferiorly.  The uterine vessels were then carefully clamped, cauterized, but not cut.  Once this was done, the uterus was then turned to the patient's right.  The left ovarian cyst was noted.  The left tube was noted to be normal.  The left ureter was found deep in the pelvis on the left peristalsing as well.  With that was done, the decision was made to remove the left ovarian cyst. The overlying surface of the cyst was opened with cautery, incidental rupture noted pale yellow fluid, and ultimately, the incision was extended.  The cyst wall was then removed.  Once that was done, the left fallopian tube was grasped.  The underlying mesosalpinx was serially clamped, cauterized, and then cut, carried down to the round ligament, which was then also clamped, cauterized, and cut followed by the left utero-ovarian ligament, which was then sequentially clamped, cauterized, and cut as well with the broad leaf anteriorly and posteriorly and subsequently opened.  The uterine vessels were noted.  Anterior vesicouterine peritoneum was then opened anteriorly to meet with the contralateral side.  The bladder was then pushed down with blunt and sharp dissections.  Small bleeders were cauterized as well.  The uterine vessels were then cauterized well on the left and then subsequently cut. On the opposite side, further skeletonization of the uterine vessel was done and cauterized and cut.  Once this was done, anteriorly, the vagina was opened and the uterus was then circumferentially separated from its vaginal attachment and this was done above the uterosacral ligaments posteriorly with monopolar cutting scissors.  The uterus was then removed.  Both fallopian tubes, the cyst wall had been already  removed. The specimen was then placed in the right upper quadrant after removing the attached instruments.  The vaginal cuff was inspected.  Small bleeder was cauterized.  Care was taken to bring up the peritoneum carefully and figure-of-eight sutures of 0 Vicryl were then used to close the vaginal cuff in a transverse fashion.  Small bleeding was noted on the left anterior aspect of that area, which was cauterized. The abdomen was then irrigated and suctioned.  Good hemostasis was noted.  The decision was then made to morcellate the uterine specimen. The robotic instruments were removed and the robot was undocked.  The  right lower quadrant assistant port was replaced by the [employer]. The specimen was then morcellated and all pieces were removed.  Once that was done, pelvic inspection was notable for the left sidewall had near with the ovarian cyst had removed had some bleeding, so that was carefully cauterized.  The abdomen was then irrigated and suctioned.  Good hemostasis was noted.  At that point, the abdomen was deflated  as the ports sites were removed.  Once these were removed, the incisions were then closed with 4-0 Vicryl subcuticular stitches in the small incisions, 0 Vicryl figure-of-eight on the fascial stitches for the 12-mm sites and subcuticular stitches of 4-0 Vicryl used to those as well.  The morcellated specimen, the fibroid weighed 411 g intraoperatively, and the cyst wall was also sent to pathology.  Estimated blood loss was 50 mL.  Intraoperative fluid was 400 mL.  Urine output was 75 mL of clear yellow urine.  Sponge and instrument counts x2 were correct. Complication was none.  The patient tolerated the procedure well, was transferred to recovery in stable condition.     Maxie Better, M.D.     [place]/MEDQ  D:  08/11/2011  T:  08/12/2011  Job:  [id number]

## 2011-08-12 NOTE — Discharge Summary (Signed)
Physician Discharge Summary  Patient ID: [name] MRN: [id number] DOB/AGE: 1966/06/19 46 y.o.  Admit date: 08/11/2011 Discharge date: 08/12/2011  Admission Diagnoses: menorrhagia , morbid obesity, uterine fibroid  Discharge Diagnoses: menorrhagia, morbid obesity, uterine fibroid, left ovarian cyst, peripheral neuropathy(S1 sensory loss) Active Problems:  * No active hospital problems. *    Discharged Condition: stable  Hospital Course: s/p davinci robotic TLH, bilateral salpingectomy left ovarian cystectomy. Postop course complicated by sensory S1 loss for which the patient was eval by neurol and PT. Further outpt eval recommended. Pt had delayed voiding which resolved w/ additional IV hydration  Consults: neurology  Significant Diagnostic Studies: PT  Treatments: surgery: see above and PT eval  Discharge Exam: Blood pressure 140/75, pulse 107, temperature 98.5 F (36.9 C), temperature source Oral, resp. rate 19, height 5\' 10"  (1.778 m), weight 202.304 kg (446 lb), SpO2 95.00%. General appearance: alert, cooperative and no distress Resp: clear to auscultation bilaterally Cardio: regular rate and rhythm, S1, S2 normal, no murmur, click, rub or gallop GI: soft, non-tender; bowel sounds normal; no masses,  no organomegaly Extremities: no edema, redness or tenderness in the calves or thighs Pulses: 2+ and symmetric Incision/Wound:well approximated  Disposition: home  Discharge Orders    Future Orders Please Complete By Expires   Diet - low sodium heart healthy      Increase activity slowly      Discharge instructions      Comments:   Call if temperature greater than equal to 100.4, nothing per vagina for 4-6 weeks or severe nausea vomiting, increased incisional pain , drainage or redness in the incision site, no straining with bowel movements, showers no bath   No wound care        Medication List  As of 08/12/2011  8:59 AM   STOP taking these medications        levonorgestrel-ethinyl estradiol 0.1-20 MG-MCG tablet      STOPAIN EX         TAKE these medications         HYDROcodone-acetaminophen 5-500 MG per tablet   Commonly known as: VICODIN   Take 1 tablet by mouth every 4 (four) hours as needed.      ibuprofen 800 MG tablet   Commonly known as: ADVIL,MOTRIN   Take 1 tablet (800 mg total) by mouth every 8 (eight) hours as needed for pain (mild pain).           Follow-up Information    Follow up with Evander Macaraeg A, MD in 2 weeks.   Contact information:   [address] [place] [address] [phone]        f/u neurol 2 wk. Open MRI per neurol outpt  Signed: Welford Christmas A 08/12/2011, 8:59 AM

## 2011-08-12 NOTE — Anesthesia Postprocedure Evaluation (Signed)
  Anesthesia Post-op Note  Patient: [name]  Procedure(s) Performed: Procedure(s) (LRB): ROBOTIC ASSISTED TOTAL HYSTERECTOMY (N/A) OVARIAN CYSTECTOMY (Left) BILATERAL SALPINGECTOMY (Bilateral)  Patient Location: PACU and Women's Unit  Anesthesia Type: General  Level of Consciousness: awake, alert  and oriented  Airway and Oxygen Therapy: Patient Spontanous Breathing  Post-op Pain: none  Post-op Assessment: Post-op Vital signs reviewed, Patient's Cardiovascular Status Stable and Pain level controlled  Post-op Vital Signs: Reviewed and stable  Complications: No apparent anesthesia complications

## 2011-08-12 NOTE — Addendum Note (Signed)
Addendum  created 08/12/11 4098 by Madison Hickman, CRNA   Modules edited:Notes Section

## 2011-08-12 NOTE — Progress Notes (Signed)
S: pt  Seen by neurol. Per pt, she needs bedside commode and walker for home. Pt has not been seen by PT. Pt has not voided since foley discontinued. Pt had limited fluid in OR and was bowel prepped.Morene Antu reg diet passing gas  O;  Morbidly obese female in NAD  IMP: Prob dehydration Peripheral neuropathy  P) Hydrate pt. Await PT consult in am

## 2011-08-12 NOTE — Progress Notes (Signed)
Home Health list given to patient:  Agencies that are Medicare-Certified and are affiliated with [facility] Agency  Telephone Number Address  [facility]   The [facility] has ownership interest in this company; however, you are under no obligation to use this agency. [phone] or  [phone] [address] [place], [place] [address]   Agencies that are Medicare-Certified and are not affiliated with [facility] Agency Telephone Number Address  [facility] [phone] Fax [phone] [address] [place], [place]  [address]  [facility] [phone] or [phone] Fax [phone] [address] [place], [place] [address]  [facility] [phone] Fax [phone] [address] [place], [place] [address]  [facility] [phone] Fax [phone] [address] [place], [place]  [address]  [facility] [phone] Fax [phone] [address] [place], [place] [address]  [facility] [phone] [address] [place], [place] [address]  [facility] [phone]  [address] [place], [place] [address]  [facility] [phone] or [phone] Fax [phone] [address] [place], [place]  [address]  [facility] [phone] Fax [phone] [address] [place], [place]  [address]  [facility]  [phone] Fax [phone] [address] [place], [place] [address]               Agencies that are not Medicare-Certified and are not affiliated with [facility] Telephone Number Address  [facility] [place]  [phone] or [phone] Fax [phone] [address] [place]  [address]  [facility] [phone] Fax [phone] [address] [place], [place]  [address]  [facility]  [phone] Fax [phone] [address] [place], [place] [address]  [facility] [phone] Fax [phone] [address] [place], [place] [address]  [facility] [phone] or [phone] Fax [phone] [address] [place], [place]  [address]  [facility] [phone] or [phone] Fax [phone] [address] [place], [place]  [address]  [facility] [phone] Fax [phone] [address] [place], [place]  [address]  [facility] [phone] [address] [place], [place]  [address]  [facility] [phone] Fax [phone] [address] [place], [place]  [address]  [facility] [phone] Fax [phone] [address] [place], [place]  [address]  [facility] [phone] Fax [phone] [address] [place], [place] [address]  [facility] [phone] Fax [phone] [address] [place], [place]  [address]

## 2011-08-13 DIAGNOSIS — Z90710 Acquired absence of both cervix and uterus: Secondary | ICD-10-CM | POA: Diagnosis not present

## 2011-08-13 LAB — URINALYSIS, ROUTINE W REFLEX MICROSCOPIC
Bilirubin Urine: NEGATIVE
Glucose, UA: NEGATIVE mg/dL
Ketones, ur: NEGATIVE mg/dL
Nitrite: NEGATIVE
Protein, ur: NEGATIVE mg/dL
Specific Gravity, Urine: 1.025 (ref 1.005–1.030)
Urobilinogen, UA: 0.2 mg/dL (ref 0.0–1.0)
pH: 6 (ref 5.0–8.0)

## 2011-08-13 LAB — URINE MICROSCOPIC-ADD ON

## 2011-08-13 MED ORDER — LACTATED RINGERS IV BOLUS (SEPSIS)
1000.0000 mL | Freq: Once | INTRAVENOUS | Status: AC
  Administered 2011-08-13: 1000 mL via INTRAVENOUS

## 2011-08-13 NOTE — Progress Notes (Signed)
   CARE MANAGEMENT NOTE 08/13/2011  Patient:  [name], [name]   Account Number:  [id number]  Date Initiated:  08/12/2011  Documentation initiated by:  Emilio Math  Subjective/Objective Assessment:   s/p robotic hysterectomy; having numbness in feet bilaterally since surgery. neruo consult done and awaiting PT consult.     Action/Plan:   possible need for [facility] PT and equipment; awaiting physical therapy consult and recommendations   Anticipated DC Date:  08/13/2011   Anticipated DC Plan:  HOME W HOME HEALTH SERVICES      DC Planning Services  CM consult      Choice offered to / List presented to:  C-1 Patient      DME agency  [facility]   Status of service:  In process, will continue to follow  Discharge Disposition:  HOME W HOME HEALTH SERVICES  Comments:  08/13/11 H. Montez Morita, RN, BSN 11:00- Recieved call from [place] with PT. Patient was evaluated and determined to need a bariatric rolling walker and bariatric bedside commode at discharge. No need for home health PT. Spoke to Marlinda Mike, CNM and received orders for DME. Patient had agreed to [facility] for DME yesterday. CM called Lecretia with [facility] and advised her of need for walker to be delivered to the patient's room prior to discharge and the bedside commode to be delivered to the home. Referral put into [facility]. CM will continue to follow. Please contact CM at [phone] for further discharge needs.  UR chart review completed.   08/12/11 L. Floyce Stakes [facility] BSN [phone]- nurse care manager received call around 5:15 pm stating patient may have possible Home Health needs and be discharging home tonight ; RN awaiting call back from MD.  At 6:00pm recieved call from RN on unit stating patient would be going home tonight with an order for PT at home. Nurse care manager came up and spoke to patient and provided patient a list of Home Health Agencies for the [facility]. Patient chose [facility] for agency at discharge.   Patient expressed concern about getting around and needing a bedside commode and also a walker for home use. Nurse Care Manager spoke to Dr Cherly Hensen on unit and MD  decided to cancel discharge home tonight and have patient's Physical therapy consult done here while inpatient prior to discharging from hospital.  Plan is to see what inpatient physical therapy consult recommends and then Dr Cherly Hensen will place order for equipment and or PT at home if needed. Please call Nurse Care Manager at [phone] with discharge needs on 08/13/11 so referral can be made with [facility] [place]# [phone]).  After talking with Dr Cherly Hensen no other needs identified at this time. Nurse Care Manager will follow up in am.

## 2011-08-13 NOTE — Progress Notes (Signed)
Rolling walker delivered by [facility] to patient's room. Bedside commode to be delivered to home. No further discharge needs identified.

## 2011-08-13 NOTE — Evaluation (Signed)
Physical Therapy Evaluation Patient Details Name: [name] MRN: [id number] DOB: November 30, 1965 Today's Date: 08/13/2011  Problem List:  Patient Active Problem List  Diagnoses  . S/P hysterectomy    Past Medical History:  Past Medical History  Diagnosis Date  . Arthritis     knees, hips, tx with otc med  . Depression     History in 2004, no current prob, no meds   Past Surgical History:  Past Surgical History  Procedure Date  . Excision of 4 areas of hidradenitis in the right axilla  1986 x 04/25/2004    x 2  . Tonsillectomy     as 46 yrs old  . Arthroscopic knee 1998    right knee  . Wisdom tooth extraction   . Ovarian cyst removal 08/11/2011    Procedure: OVARIAN CYSTECTOMY;  Surgeon: Serita Kyle, MD;  Location: [facility] ORS;  Service: Gynecology;  Laterality: Left;    PT Assessment/Plan/Recommendation PT Assessment Clinical Impression Statement: Pt with adequate mobility to return home with bariatric rolling walker and bariatric 3 in 1. PT Recommendation/Assessment: Patent does not need any further PT services PT Recommendation Follow Up Recommendations: No PT follow up Equipment Recommended: 3 in 1 bedside comode;Rolling walker with 5" wheels (bariatric) PT Goals     PT Evaluation Precautions/Restrictions    Prior Functioning  Home Living Lives With: Alone (aunt will help at dc) Type of Home: Apartment Home Layout: One level Home Access: Level entry Prior Function Level of Independence: Independent with basic ADLs;Independent with transfers;Independent with homemaking with ambulation;Independent with gait Cognition Cognition Arousal/Alertness: Awake/alert Overall Cognitive Status: Appears within functional limits for tasks assessed Orientation Level: Oriented X4 Sensation/Coordination Sensation Light Touch: Impaired Detail Light Touch Impaired Details: Impaired LLE;Impaired RLE Coordination Gross Motor Movements are Fluid and Coordinated:  Yes Extremity Assessment RLE Assessment RLE Assessment: Within Functional Limits LLE Assessment LLE Assessment: Within Functional Limits Mobility (including Balance) Bed Mobility Supine to Sit: 4: Min assist;HOB flat Supine to Sit Details (indicate cue type and reason): assist to bring trunk up.  Hospital be not large enough for pt to roll.  Recommended at home in larger bed pt roll to side and then go side to sit. Sit to Supine: 4: Min assist;HOB flat Sit to Supine - Details (indicate cue type and reason): assist to bring foot up.  At home recommend sit to sidelying with larger bed. Transfers Sit to Stand: 6: Modified independent (Device/Increase time);With upper extremity assist;From bed Stand to Sit: 6: Modified independent (Device/Increase time);With upper extremity assist;To bed Ambulation/Gait Ambulation/Gait Assistance: 6: Modified independent (Device/Increase time) Ambulation Distance (Feet): 30 Feet Assistive device: Rolling walker (bariatric) Gait Pattern:  (wide base due to body habitus) Stairs:  (Explained up/down curb or threshhold with RW.)    Exercise    End of Session PT - End of Session Activity Tolerance: Patient tolerated treatment well Patient left: in bed Nurse Communication: Mobility status for ambulation  [name] 08/13/2011, 10:43 AM  [facility] PT [phone]

## 2011-08-13 NOTE — Progress Notes (Signed)
PT Note   Evaluation completed and in [facility] Flowsheets.  Unable to pull full note to this page do to technical issues.  Pt mobilizing adequately with bariatric rolling walker to return home.  Will need bariatric rolling walker and bariatric 3-n-1 commode for home.  No further PT follow-up needed.  Harrison Mons [facility] PT [phone]

## 2011-08-13 NOTE — Discharge Summary (Signed)
Pt. Was discharged in the care of  Aunt. Downstairs per wheelchair. Stable denies any pain or discomfort, pt discharged with walker and commode for home use.. No heavy vaginal bleeding.. comphrended all instructions  Well.

## 2011-08-13 NOTE — Progress Notes (Signed)
Subjective: Patient reports tolerating PO and no problems voiding.    Objective: I have reviewed patient's vital signs.  vital signs, intake and output and labs. Filed Vitals:   08/13/11 1000  BP: 128/84  Pulse: 113  Temp: 97.8 F (36.6 C)  Resp: 18   I/O last 3 completed shifts: In: 4851.9 [P.O.:2370; I.V.:1781.9; Other:700] Out: 1700 [Urine:1700]    Lab Results  Component Value Date   WBC 12.5* 08/12/2011   HGB 12.0 08/12/2011   HCT 38.8 08/12/2011   MCV 91.5 08/12/2011   PLT 241 08/12/2011   Lab Results  Component Value Date   CREATININE 0.93 08/12/2011    EXAM General: alert, cooperative and no distress Resp: clear to auscultation bilaterally Cardio: regular rate and rhythm, S1, S2 normal, no murmur, click, rub or gallop GI: soft nondistended obese. incisions well approximated Extremities: no edema, redness or tenderness in the calves or thighs Vaginal Bleeding: none  Assessment: s/p Procedure(s):Davinci ROBOTIC ASSISTED TOTAL HYSTERECTOMY, LEFT OVARIAN CYSTECTOMY BILATERAL SALPINGECTOMY: stable and peripheral neuropathy unchanged per pt. Seen by neurol and physical therapy. Bariatric bed side commode and walker  Plan: Encourage ambulation Discharge homed/c instructions reviewed yesterday. Script for Motrin,  vicodin given f/u 2 wk  LOS: 2 days    Cia Garretson A, MD 08/13/2011 1:56 PM    08/13/2011, 1:56 PM

## 2016-08-03 DIAGNOSIS — E119 Type 2 diabetes mellitus without complications: Secondary | ICD-10-CM | POA: Diagnosis not present

## 2016-08-03 DIAGNOSIS — B354 Tinea corporis: Secondary | ICD-10-CM | POA: Diagnosis not present

## 2016-08-03 DIAGNOSIS — Z6841 Body Mass Index (BMI) 40.0 and over, adult: Secondary | ICD-10-CM | POA: Diagnosis not present

## 2016-08-26 DIAGNOSIS — E119 Type 2 diabetes mellitus without complications: Secondary | ICD-10-CM | POA: Diagnosis not present

## 2016-09-22 DIAGNOSIS — L0390 Cellulitis, unspecified: Secondary | ICD-10-CM | POA: Diagnosis not present

## 2016-09-22 DIAGNOSIS — Z6841 Body Mass Index (BMI) 40.0 and over, adult: Secondary | ICD-10-CM | POA: Diagnosis not present

## 2016-09-22 DIAGNOSIS — E119 Type 2 diabetes mellitus without complications: Secondary | ICD-10-CM | POA: Diagnosis not present

## 2016-09-24 DIAGNOSIS — Z6841 Body Mass Index (BMI) 40.0 and over, adult: Secondary | ICD-10-CM | POA: Diagnosis not present

## 2016-09-24 DIAGNOSIS — L0390 Cellulitis, unspecified: Secondary | ICD-10-CM | POA: Diagnosis not present

## 2016-09-24 DIAGNOSIS — E119 Type 2 diabetes mellitus without complications: Secondary | ICD-10-CM | POA: Diagnosis not present

## 2016-10-01 ENCOUNTER — Encounter (HOSPITAL_BASED_OUTPATIENT_CLINIC_OR_DEPARTMENT_OTHER): Payer: BLUE CROSS/BLUE SHIELD | Attending: Internal Medicine

## 2016-10-01 ENCOUNTER — Other Ambulatory Visit (HOSPITAL_COMMUNITY)
Admission: RE | Admit: 2016-10-01 | Discharge: 2016-10-01 | Disposition: A | Discharge status: Home / Self Care | Payer: BLUE CROSS/BLUE SHIELD | Source: Other Acute Inpatient Hospital | Attending: Internal Medicine | Admitting: Internal Medicine

## 2016-10-01 DIAGNOSIS — L97822 Non-pressure chronic ulcer of other part of left lower leg with fat layer exposed: Secondary | ICD-10-CM | POA: Diagnosis not present

## 2016-10-01 DIAGNOSIS — M1990 Unspecified osteoarthritis, unspecified site: Secondary | ICD-10-CM | POA: Diagnosis not present

## 2016-10-01 DIAGNOSIS — E11621 Type 2 diabetes mellitus with foot ulcer: Secondary | ICD-10-CM | POA: Diagnosis not present

## 2016-10-01 DIAGNOSIS — E6609 Other obesity due to excess calories: Secondary | ICD-10-CM | POA: Diagnosis not present

## 2016-10-01 DIAGNOSIS — L8993 Pressure ulcer of unspecified site, stage 3: Secondary | ICD-10-CM | POA: Diagnosis not present

## 2016-10-01 DIAGNOSIS — Z7984 Long term (current) use of oral hypoglycemic drugs: Secondary | ICD-10-CM | POA: Diagnosis not present

## 2016-10-01 DIAGNOSIS — E11622 Type 2 diabetes mellitus with other skin ulcer: Secondary | ICD-10-CM | POA: Insufficient documentation

## 2016-10-01 DIAGNOSIS — Z6841 Body Mass Index (BMI) 40.0 and over, adult: Secondary | ICD-10-CM | POA: Insufficient documentation

## 2016-10-01 DIAGNOSIS — F329 Major depressive disorder, single episode, unspecified: Secondary | ICD-10-CM | POA: Diagnosis not present

## 2016-10-04 LAB — AEROBIC CULTURE W GRAM STAIN (SUPERFICIAL SPECIMEN)

## 2016-10-04 LAB — AEROBIC CULTURE  (SUPERFICIAL SPECIMEN)

## 2016-10-05 DIAGNOSIS — E119 Type 2 diabetes mellitus without complications: Secondary | ICD-10-CM | POA: Diagnosis not present

## 2016-10-07 DIAGNOSIS — E119 Type 2 diabetes mellitus without complications: Secondary | ICD-10-CM | POA: Diagnosis not present

## 2016-10-07 DIAGNOSIS — L0390 Cellulitis, unspecified: Secondary | ICD-10-CM | POA: Diagnosis not present

## 2016-10-07 DIAGNOSIS — Z6841 Body Mass Index (BMI) 40.0 and over, adult: Secondary | ICD-10-CM | POA: Diagnosis not present

## 2016-10-08 DIAGNOSIS — F329 Major depressive disorder, single episode, unspecified: Secondary | ICD-10-CM | POA: Diagnosis not present

## 2016-10-08 DIAGNOSIS — Z7984 Long term (current) use of oral hypoglycemic drugs: Secondary | ICD-10-CM | POA: Diagnosis not present

## 2016-10-08 DIAGNOSIS — E6609 Other obesity due to excess calories: Secondary | ICD-10-CM | POA: Diagnosis not present

## 2016-10-08 DIAGNOSIS — L97822 Non-pressure chronic ulcer of other part of left lower leg with fat layer exposed: Secondary | ICD-10-CM | POA: Diagnosis not present

## 2016-10-08 DIAGNOSIS — E11622 Type 2 diabetes mellitus with other skin ulcer: Secondary | ICD-10-CM | POA: Diagnosis not present

## 2016-10-08 DIAGNOSIS — M1990 Unspecified osteoarthritis, unspecified site: Secondary | ICD-10-CM | POA: Diagnosis not present

## 2016-10-08 DIAGNOSIS — Z6841 Body Mass Index (BMI) 40.0 and over, adult: Secondary | ICD-10-CM | POA: Diagnosis not present

## 2016-10-08 DIAGNOSIS — L8993 Pressure ulcer of unspecified site, stage 3: Secondary | ICD-10-CM | POA: Diagnosis not present

## 2016-10-09 ENCOUNTER — Other Ambulatory Visit (HOSPITAL_COMMUNITY)
Admission: RE | Admit: 2016-10-09 | Discharge: 2016-10-09 | Disposition: A | Discharge status: Home / Self Care | Payer: BLUE CROSS/BLUE SHIELD | Source: Other Acute Inpatient Hospital | Attending: Internal Medicine | Admitting: Internal Medicine

## 2016-10-09 DIAGNOSIS — L8993 Pressure ulcer of unspecified site, stage 3: Secondary | ICD-10-CM | POA: Diagnosis not present

## 2016-10-09 DIAGNOSIS — E11622 Type 2 diabetes mellitus with other skin ulcer: Secondary | ICD-10-CM | POA: Diagnosis not present

## 2016-10-12 LAB — AEROBIC CULTURE  (SUPERFICIAL SPECIMEN)

## 2016-10-12 LAB — AEROBIC CULTURE W GRAM STAIN (SUPERFICIAL SPECIMEN)

## 2016-10-15 DIAGNOSIS — F329 Major depressive disorder, single episode, unspecified: Secondary | ICD-10-CM | POA: Diagnosis not present

## 2016-10-15 DIAGNOSIS — M1990 Unspecified osteoarthritis, unspecified site: Secondary | ICD-10-CM | POA: Diagnosis not present

## 2016-10-15 DIAGNOSIS — Z7984 Long term (current) use of oral hypoglycemic drugs: Secondary | ICD-10-CM | POA: Diagnosis not present

## 2016-10-15 DIAGNOSIS — L97822 Non-pressure chronic ulcer of other part of left lower leg with fat layer exposed: Secondary | ICD-10-CM | POA: Diagnosis not present

## 2016-10-15 DIAGNOSIS — L8993 Pressure ulcer of unspecified site, stage 3: Secondary | ICD-10-CM | POA: Diagnosis not present

## 2016-10-15 DIAGNOSIS — E6609 Other obesity due to excess calories: Secondary | ICD-10-CM | POA: Diagnosis not present

## 2016-10-15 DIAGNOSIS — Z6841 Body Mass Index (BMI) 40.0 and over, adult: Secondary | ICD-10-CM | POA: Diagnosis not present

## 2016-10-15 DIAGNOSIS — E11622 Type 2 diabetes mellitus with other skin ulcer: Secondary | ICD-10-CM | POA: Diagnosis not present

## 2016-10-19 DIAGNOSIS — I890 Lymphedema, not elsewhere classified: Secondary | ICD-10-CM | POA: Diagnosis not present

## 2016-10-19 DIAGNOSIS — E11622 Type 2 diabetes mellitus with other skin ulcer: Secondary | ICD-10-CM | POA: Diagnosis not present

## 2016-10-19 DIAGNOSIS — B9689 Other specified bacterial agents as the cause of diseases classified elsewhere: Secondary | ICD-10-CM | POA: Diagnosis not present

## 2016-10-19 DIAGNOSIS — L97822 Non-pressure chronic ulcer of other part of left lower leg with fat layer exposed: Secondary | ICD-10-CM | POA: Diagnosis not present

## 2016-10-19 DIAGNOSIS — Z6841 Body Mass Index (BMI) 40.0 and over, adult: Secondary | ICD-10-CM | POA: Diagnosis not present

## 2016-10-22 ENCOUNTER — Encounter (HOSPITAL_BASED_OUTPATIENT_CLINIC_OR_DEPARTMENT_OTHER): Payer: BLUE CROSS/BLUE SHIELD | Attending: Internal Medicine

## 2016-10-22 DIAGNOSIS — I890 Lymphedema, not elsewhere classified: Secondary | ICD-10-CM | POA: Diagnosis not present

## 2016-10-22 DIAGNOSIS — L97822 Non-pressure chronic ulcer of other part of left lower leg with fat layer exposed: Secondary | ICD-10-CM | POA: Diagnosis not present

## 2016-10-22 DIAGNOSIS — Z6841 Body Mass Index (BMI) 40.0 and over, adult: Secondary | ICD-10-CM | POA: Insufficient documentation

## 2016-10-22 DIAGNOSIS — Z7984 Long term (current) use of oral hypoglycemic drugs: Secondary | ICD-10-CM | POA: Diagnosis not present

## 2016-10-22 DIAGNOSIS — E11622 Type 2 diabetes mellitus with other skin ulcer: Secondary | ICD-10-CM | POA: Diagnosis not present

## 2016-10-22 DIAGNOSIS — E6609 Other obesity due to excess calories: Secondary | ICD-10-CM | POA: Diagnosis not present

## 2016-10-22 DIAGNOSIS — M1990 Unspecified osteoarthritis, unspecified site: Secondary | ICD-10-CM | POA: Diagnosis not present

## 2016-10-22 DIAGNOSIS — L8993 Pressure ulcer of unspecified site, stage 3: Secondary | ICD-10-CM | POA: Insufficient documentation

## 2016-10-26 DIAGNOSIS — Z6841 Body Mass Index (BMI) 40.0 and over, adult: Secondary | ICD-10-CM | POA: Diagnosis not present

## 2016-10-26 DIAGNOSIS — B9689 Other specified bacterial agents as the cause of diseases classified elsewhere: Secondary | ICD-10-CM | POA: Diagnosis not present

## 2016-10-26 DIAGNOSIS — I890 Lymphedema, not elsewhere classified: Secondary | ICD-10-CM | POA: Diagnosis not present

## 2016-10-26 DIAGNOSIS — L97822 Non-pressure chronic ulcer of other part of left lower leg with fat layer exposed: Secondary | ICD-10-CM | POA: Diagnosis not present

## 2016-10-26 DIAGNOSIS — E11622 Type 2 diabetes mellitus with other skin ulcer: Secondary | ICD-10-CM | POA: Diagnosis not present

## 2016-10-29 DIAGNOSIS — Z6841 Body Mass Index (BMI) 40.0 and over, adult: Secondary | ICD-10-CM | POA: Diagnosis not present

## 2016-10-29 DIAGNOSIS — E11622 Type 2 diabetes mellitus with other skin ulcer: Secondary | ICD-10-CM | POA: Diagnosis not present

## 2016-10-29 DIAGNOSIS — L8993 Pressure ulcer of unspecified site, stage 3: Secondary | ICD-10-CM | POA: Diagnosis not present

## 2016-10-29 DIAGNOSIS — M1990 Unspecified osteoarthritis, unspecified site: Secondary | ICD-10-CM | POA: Diagnosis not present

## 2016-10-29 DIAGNOSIS — L97822 Non-pressure chronic ulcer of other part of left lower leg with fat layer exposed: Secondary | ICD-10-CM | POA: Diagnosis not present

## 2016-10-29 DIAGNOSIS — Z7984 Long term (current) use of oral hypoglycemic drugs: Secondary | ICD-10-CM | POA: Diagnosis not present

## 2016-10-29 DIAGNOSIS — I890 Lymphedema, not elsewhere classified: Secondary | ICD-10-CM | POA: Diagnosis not present

## 2016-10-29 DIAGNOSIS — E6609 Other obesity due to excess calories: Secondary | ICD-10-CM | POA: Diagnosis not present

## 2016-11-02 DIAGNOSIS — L97822 Non-pressure chronic ulcer of other part of left lower leg with fat layer exposed: Secondary | ICD-10-CM | POA: Diagnosis not present

## 2016-11-02 DIAGNOSIS — B9689 Other specified bacterial agents as the cause of diseases classified elsewhere: Secondary | ICD-10-CM | POA: Diagnosis not present

## 2016-11-02 DIAGNOSIS — I890 Lymphedema, not elsewhere classified: Secondary | ICD-10-CM | POA: Diagnosis not present

## 2016-11-02 DIAGNOSIS — Z6841 Body Mass Index (BMI) 40.0 and over, adult: Secondary | ICD-10-CM | POA: Diagnosis not present

## 2016-11-02 DIAGNOSIS — E11622 Type 2 diabetes mellitus with other skin ulcer: Secondary | ICD-10-CM | POA: Diagnosis not present

## 2016-11-05 DIAGNOSIS — L8993 Pressure ulcer of unspecified site, stage 3: Secondary | ICD-10-CM | POA: Diagnosis not present

## 2016-11-05 DIAGNOSIS — I890 Lymphedema, not elsewhere classified: Secondary | ICD-10-CM | POA: Diagnosis not present

## 2016-11-05 DIAGNOSIS — Z6841 Body Mass Index (BMI) 40.0 and over, adult: Secondary | ICD-10-CM | POA: Diagnosis not present

## 2016-11-05 DIAGNOSIS — M1990 Unspecified osteoarthritis, unspecified site: Secondary | ICD-10-CM | POA: Diagnosis not present

## 2016-11-05 DIAGNOSIS — L0390 Cellulitis, unspecified: Secondary | ICD-10-CM | POA: Diagnosis not present

## 2016-11-05 DIAGNOSIS — E11622 Type 2 diabetes mellitus with other skin ulcer: Secondary | ICD-10-CM | POA: Diagnosis not present

## 2016-11-05 DIAGNOSIS — L97822 Non-pressure chronic ulcer of other part of left lower leg with fat layer exposed: Secondary | ICD-10-CM | POA: Diagnosis not present

## 2016-11-05 DIAGNOSIS — E119 Type 2 diabetes mellitus without complications: Secondary | ICD-10-CM | POA: Diagnosis not present

## 2016-11-05 DIAGNOSIS — Z7984 Long term (current) use of oral hypoglycemic drugs: Secondary | ICD-10-CM | POA: Diagnosis not present

## 2016-11-05 DIAGNOSIS — E6609 Other obesity due to excess calories: Secondary | ICD-10-CM | POA: Diagnosis not present

## 2016-11-09 DIAGNOSIS — B9689 Other specified bacterial agents as the cause of diseases classified elsewhere: Secondary | ICD-10-CM | POA: Diagnosis not present

## 2016-11-09 DIAGNOSIS — Z6841 Body Mass Index (BMI) 40.0 and over, adult: Secondary | ICD-10-CM | POA: Diagnosis not present

## 2016-11-09 DIAGNOSIS — L97822 Non-pressure chronic ulcer of other part of left lower leg with fat layer exposed: Secondary | ICD-10-CM | POA: Diagnosis not present

## 2016-11-09 DIAGNOSIS — E11622 Type 2 diabetes mellitus with other skin ulcer: Secondary | ICD-10-CM | POA: Diagnosis not present

## 2016-11-09 DIAGNOSIS — I890 Lymphedema, not elsewhere classified: Secondary | ICD-10-CM | POA: Diagnosis not present

## 2016-11-12 DIAGNOSIS — E6609 Other obesity due to excess calories: Secondary | ICD-10-CM | POA: Diagnosis not present

## 2016-11-12 DIAGNOSIS — Z6841 Body Mass Index (BMI) 40.0 and over, adult: Secondary | ICD-10-CM | POA: Diagnosis not present

## 2016-11-12 DIAGNOSIS — Z7984 Long term (current) use of oral hypoglycemic drugs: Secondary | ICD-10-CM | POA: Diagnosis not present

## 2016-11-12 DIAGNOSIS — L8993 Pressure ulcer of unspecified site, stage 3: Secondary | ICD-10-CM | POA: Diagnosis not present

## 2016-11-12 DIAGNOSIS — L97822 Non-pressure chronic ulcer of other part of left lower leg with fat layer exposed: Secondary | ICD-10-CM | POA: Diagnosis not present

## 2016-11-12 DIAGNOSIS — E11622 Type 2 diabetes mellitus with other skin ulcer: Secondary | ICD-10-CM | POA: Diagnosis not present

## 2016-11-12 DIAGNOSIS — I890 Lymphedema, not elsewhere classified: Secondary | ICD-10-CM | POA: Diagnosis not present

## 2016-11-12 DIAGNOSIS — M1990 Unspecified osteoarthritis, unspecified site: Secondary | ICD-10-CM | POA: Diagnosis not present

## 2016-11-17 DIAGNOSIS — L97822 Non-pressure chronic ulcer of other part of left lower leg with fat layer exposed: Secondary | ICD-10-CM | POA: Diagnosis not present

## 2016-11-17 DIAGNOSIS — E11622 Type 2 diabetes mellitus with other skin ulcer: Secondary | ICD-10-CM | POA: Diagnosis not present

## 2016-11-17 DIAGNOSIS — I890 Lymphedema, not elsewhere classified: Secondary | ICD-10-CM | POA: Diagnosis not present

## 2016-11-17 DIAGNOSIS — Z6841 Body Mass Index (BMI) 40.0 and over, adult: Secondary | ICD-10-CM | POA: Diagnosis not present

## 2016-11-17 DIAGNOSIS — B9689 Other specified bacterial agents as the cause of diseases classified elsewhere: Secondary | ICD-10-CM | POA: Diagnosis not present

## 2016-11-19 DIAGNOSIS — I890 Lymphedema, not elsewhere classified: Secondary | ICD-10-CM | POA: Diagnosis not present

## 2016-11-19 DIAGNOSIS — L8993 Pressure ulcer of unspecified site, stage 3: Secondary | ICD-10-CM | POA: Diagnosis not present

## 2016-11-19 DIAGNOSIS — Z6841 Body Mass Index (BMI) 40.0 and over, adult: Secondary | ICD-10-CM | POA: Diagnosis not present

## 2016-11-19 DIAGNOSIS — M1990 Unspecified osteoarthritis, unspecified site: Secondary | ICD-10-CM | POA: Diagnosis not present

## 2016-11-19 DIAGNOSIS — E6609 Other obesity due to excess calories: Secondary | ICD-10-CM | POA: Diagnosis not present

## 2016-11-19 DIAGNOSIS — L97822 Non-pressure chronic ulcer of other part of left lower leg with fat layer exposed: Secondary | ICD-10-CM | POA: Diagnosis not present

## 2016-11-19 DIAGNOSIS — Z7984 Long term (current) use of oral hypoglycemic drugs: Secondary | ICD-10-CM | POA: Diagnosis not present

## 2016-11-19 DIAGNOSIS — E11622 Type 2 diabetes mellitus with other skin ulcer: Secondary | ICD-10-CM | POA: Diagnosis not present

## 2016-11-23 DIAGNOSIS — B9689 Other specified bacterial agents as the cause of diseases classified elsewhere: Secondary | ICD-10-CM | POA: Diagnosis not present

## 2016-11-23 DIAGNOSIS — Z6841 Body Mass Index (BMI) 40.0 and over, adult: Secondary | ICD-10-CM | POA: Diagnosis not present

## 2016-11-23 DIAGNOSIS — L97822 Non-pressure chronic ulcer of other part of left lower leg with fat layer exposed: Secondary | ICD-10-CM | POA: Diagnosis not present

## 2016-11-23 DIAGNOSIS — I890 Lymphedema, not elsewhere classified: Secondary | ICD-10-CM | POA: Diagnosis not present

## 2016-11-23 DIAGNOSIS — E11622 Type 2 diabetes mellitus with other skin ulcer: Secondary | ICD-10-CM | POA: Diagnosis not present

## 2016-11-26 ENCOUNTER — Encounter (HOSPITAL_BASED_OUTPATIENT_CLINIC_OR_DEPARTMENT_OTHER): Payer: BLUE CROSS/BLUE SHIELD | Attending: Internal Medicine

## 2016-11-26 DIAGNOSIS — L03116 Cellulitis of left lower limb: Secondary | ICD-10-CM | POA: Diagnosis not present

## 2016-11-26 DIAGNOSIS — I87322 Chronic venous hypertension (idiopathic) with inflammation of left lower extremity: Secondary | ICD-10-CM | POA: Insufficient documentation

## 2016-11-26 DIAGNOSIS — Z22321 Carrier or suspected carrier of Methicillin susceptible Staphylococcus aureus: Secondary | ICD-10-CM | POA: Insufficient documentation

## 2016-11-26 DIAGNOSIS — Z6841 Body Mass Index (BMI) 40.0 and over, adult: Secondary | ICD-10-CM | POA: Diagnosis not present

## 2016-11-26 DIAGNOSIS — E11622 Type 2 diabetes mellitus with other skin ulcer: Secondary | ICD-10-CM | POA: Insufficient documentation

## 2016-11-26 DIAGNOSIS — L97222 Non-pressure chronic ulcer of left calf with fat layer exposed: Secondary | ICD-10-CM | POA: Insufficient documentation

## 2016-11-26 DIAGNOSIS — E6609 Other obesity due to excess calories: Secondary | ICD-10-CM | POA: Diagnosis not present

## 2016-11-26 DIAGNOSIS — I890 Lymphedema, not elsewhere classified: Secondary | ICD-10-CM | POA: Diagnosis not present

## 2016-11-26 DIAGNOSIS — L89899 Pressure ulcer of other site, unspecified stage: Secondary | ICD-10-CM | POA: Diagnosis not present

## 2016-11-30 DIAGNOSIS — B9689 Other specified bacterial agents as the cause of diseases classified elsewhere: Secondary | ICD-10-CM | POA: Diagnosis not present

## 2016-11-30 DIAGNOSIS — L97822 Non-pressure chronic ulcer of other part of left lower leg with fat layer exposed: Secondary | ICD-10-CM | POA: Diagnosis not present

## 2016-11-30 DIAGNOSIS — Z6841 Body Mass Index (BMI) 40.0 and over, adult: Secondary | ICD-10-CM | POA: Diagnosis not present

## 2016-11-30 DIAGNOSIS — I890 Lymphedema, not elsewhere classified: Secondary | ICD-10-CM | POA: Diagnosis not present

## 2016-11-30 DIAGNOSIS — E11622 Type 2 diabetes mellitus with other skin ulcer: Secondary | ICD-10-CM | POA: Diagnosis not present

## 2016-12-03 DIAGNOSIS — E11622 Type 2 diabetes mellitus with other skin ulcer: Secondary | ICD-10-CM | POA: Diagnosis not present

## 2016-12-03 DIAGNOSIS — I890 Lymphedema, not elsewhere classified: Secondary | ICD-10-CM | POA: Diagnosis not present

## 2016-12-03 DIAGNOSIS — Z6841 Body Mass Index (BMI) 40.0 and over, adult: Secondary | ICD-10-CM | POA: Diagnosis not present

## 2016-12-03 DIAGNOSIS — Z22321 Carrier or suspected carrier of Methicillin susceptible Staphylococcus aureus: Secondary | ICD-10-CM | POA: Diagnosis not present

## 2016-12-03 DIAGNOSIS — L97222 Non-pressure chronic ulcer of left calf with fat layer exposed: Secondary | ICD-10-CM | POA: Diagnosis not present

## 2016-12-03 DIAGNOSIS — L03116 Cellulitis of left lower limb: Secondary | ICD-10-CM | POA: Diagnosis not present

## 2016-12-03 DIAGNOSIS — L97822 Non-pressure chronic ulcer of other part of left lower leg with fat layer exposed: Secondary | ICD-10-CM | POA: Diagnosis not present

## 2016-12-03 DIAGNOSIS — I87322 Chronic venous hypertension (idiopathic) with inflammation of left lower extremity: Secondary | ICD-10-CM | POA: Diagnosis not present

## 2016-12-03 DIAGNOSIS — E6609 Other obesity due to excess calories: Secondary | ICD-10-CM | POA: Diagnosis not present

## 2016-12-07 DIAGNOSIS — I890 Lymphedema, not elsewhere classified: Secondary | ICD-10-CM | POA: Diagnosis not present

## 2016-12-07 DIAGNOSIS — E11622 Type 2 diabetes mellitus with other skin ulcer: Secondary | ICD-10-CM | POA: Diagnosis not present

## 2016-12-07 DIAGNOSIS — Z6841 Body Mass Index (BMI) 40.0 and over, adult: Secondary | ICD-10-CM | POA: Diagnosis not present

## 2016-12-07 DIAGNOSIS — L97822 Non-pressure chronic ulcer of other part of left lower leg with fat layer exposed: Secondary | ICD-10-CM | POA: Diagnosis not present

## 2016-12-07 DIAGNOSIS — B9689 Other specified bacterial agents as the cause of diseases classified elsewhere: Secondary | ICD-10-CM | POA: Diagnosis not present

## 2016-12-10 ENCOUNTER — Other Ambulatory Visit (HOSPITAL_COMMUNITY)
Admission: RE | Admit: 2016-12-10 | Discharge: 2016-12-10 | Disposition: A | Discharge status: Home / Self Care | Payer: BLUE CROSS/BLUE SHIELD | Source: Other Acute Inpatient Hospital | Attending: Internal Medicine | Admitting: Internal Medicine

## 2016-12-10 DIAGNOSIS — Z6841 Body Mass Index (BMI) 40.0 and over, adult: Secondary | ICD-10-CM | POA: Diagnosis not present

## 2016-12-10 DIAGNOSIS — E6609 Other obesity due to excess calories: Secondary | ICD-10-CM | POA: Diagnosis not present

## 2016-12-10 DIAGNOSIS — E11622 Type 2 diabetes mellitus with other skin ulcer: Secondary | ICD-10-CM | POA: Diagnosis not present

## 2016-12-10 DIAGNOSIS — L97822 Non-pressure chronic ulcer of other part of left lower leg with fat layer exposed: Secondary | ICD-10-CM | POA: Diagnosis not present

## 2016-12-10 DIAGNOSIS — I87322 Chronic venous hypertension (idiopathic) with inflammation of left lower extremity: Secondary | ICD-10-CM | POA: Diagnosis not present

## 2016-12-10 DIAGNOSIS — L03116 Cellulitis of left lower limb: Secondary | ICD-10-CM | POA: Diagnosis not present

## 2016-12-10 DIAGNOSIS — I890 Lymphedema, not elsewhere classified: Secondary | ICD-10-CM | POA: Diagnosis not present

## 2016-12-10 DIAGNOSIS — Z22321 Carrier or suspected carrier of Methicillin susceptible Staphylococcus aureus: Secondary | ICD-10-CM | POA: Diagnosis not present

## 2016-12-10 DIAGNOSIS — L97222 Non-pressure chronic ulcer of left calf with fat layer exposed: Secondary | ICD-10-CM | POA: Diagnosis not present

## 2016-12-10 DIAGNOSIS — L97221 Non-pressure chronic ulcer of left calf limited to breakdown of skin: Secondary | ICD-10-CM | POA: Insufficient documentation

## 2016-12-14 DIAGNOSIS — B9689 Other specified bacterial agents as the cause of diseases classified elsewhere: Secondary | ICD-10-CM | POA: Diagnosis not present

## 2016-12-14 DIAGNOSIS — Z6841 Body Mass Index (BMI) 40.0 and over, adult: Secondary | ICD-10-CM | POA: Diagnosis not present

## 2016-12-14 DIAGNOSIS — E119 Type 2 diabetes mellitus without complications: Secondary | ICD-10-CM | POA: Diagnosis not present

## 2016-12-14 DIAGNOSIS — L97822 Non-pressure chronic ulcer of other part of left lower leg with fat layer exposed: Secondary | ICD-10-CM | POA: Diagnosis not present

## 2016-12-14 DIAGNOSIS — T148XXD Other injury of unspecified body region, subsequent encounter: Secondary | ICD-10-CM | POA: Diagnosis not present

## 2016-12-14 DIAGNOSIS — E11622 Type 2 diabetes mellitus with other skin ulcer: Secondary | ICD-10-CM | POA: Diagnosis not present

## 2016-12-14 DIAGNOSIS — I890 Lymphedema, not elsewhere classified: Secondary | ICD-10-CM | POA: Diagnosis not present

## 2016-12-14 LAB — AEROBIC CULTURE  (SUPERFICIAL SPECIMEN): GRAM STAIN: NONE SEEN

## 2016-12-14 LAB — AEROBIC CULTURE W GRAM STAIN (SUPERFICIAL SPECIMEN)

## 2016-12-17 DIAGNOSIS — L97222 Non-pressure chronic ulcer of left calf with fat layer exposed: Secondary | ICD-10-CM | POA: Diagnosis not present

## 2016-12-17 DIAGNOSIS — Z22321 Carrier or suspected carrier of Methicillin susceptible Staphylococcus aureus: Secondary | ICD-10-CM | POA: Diagnosis not present

## 2016-12-17 DIAGNOSIS — E6609 Other obesity due to excess calories: Secondary | ICD-10-CM | POA: Diagnosis not present

## 2016-12-17 DIAGNOSIS — Z6841 Body Mass Index (BMI) 40.0 and over, adult: Secondary | ICD-10-CM | POA: Diagnosis not present

## 2016-12-17 DIAGNOSIS — E11622 Type 2 diabetes mellitus with other skin ulcer: Secondary | ICD-10-CM | POA: Diagnosis not present

## 2016-12-17 DIAGNOSIS — L03116 Cellulitis of left lower limb: Secondary | ICD-10-CM | POA: Diagnosis not present

## 2016-12-17 DIAGNOSIS — B9561 Methicillin susceptible Staphylococcus aureus infection as the cause of diseases classified elsewhere: Secondary | ICD-10-CM | POA: Diagnosis not present

## 2016-12-17 DIAGNOSIS — I87322 Chronic venous hypertension (idiopathic) with inflammation of left lower extremity: Secondary | ICD-10-CM | POA: Diagnosis not present

## 2016-12-17 DIAGNOSIS — I890 Lymphedema, not elsewhere classified: Secondary | ICD-10-CM | POA: Diagnosis not present

## 2016-12-17 DIAGNOSIS — L97822 Non-pressure chronic ulcer of other part of left lower leg with fat layer exposed: Secondary | ICD-10-CM | POA: Diagnosis not present

## 2016-12-24 ENCOUNTER — Encounter (HOSPITAL_BASED_OUTPATIENT_CLINIC_OR_DEPARTMENT_OTHER): Payer: BLUE CROSS/BLUE SHIELD | Attending: Internal Medicine

## 2016-12-24 DIAGNOSIS — L97222 Non-pressure chronic ulcer of left calf with fat layer exposed: Secondary | ICD-10-CM | POA: Insufficient documentation

## 2016-12-24 DIAGNOSIS — E11622 Type 2 diabetes mellitus with other skin ulcer: Secondary | ICD-10-CM | POA: Diagnosis not present

## 2016-12-24 DIAGNOSIS — I87332 Chronic venous hypertension (idiopathic) with ulcer and inflammation of left lower extremity: Secondary | ICD-10-CM | POA: Diagnosis present

## 2016-12-24 DIAGNOSIS — Z6841 Body Mass Index (BMI) 40.0 and over, adult: Secondary | ICD-10-CM | POA: Diagnosis not present

## 2016-12-24 DIAGNOSIS — I890 Lymphedema, not elsewhere classified: Secondary | ICD-10-CM | POA: Diagnosis not present

## 2016-12-24 DIAGNOSIS — E6609 Other obesity due to excess calories: Secondary | ICD-10-CM | POA: Diagnosis not present

## 2016-12-31 DIAGNOSIS — E11622 Type 2 diabetes mellitus with other skin ulcer: Secondary | ICD-10-CM | POA: Diagnosis not present

## 2017-01-07 DIAGNOSIS — E11622 Type 2 diabetes mellitus with other skin ulcer: Secondary | ICD-10-CM | POA: Diagnosis not present

## 2017-01-15 DIAGNOSIS — E11622 Type 2 diabetes mellitus with other skin ulcer: Secondary | ICD-10-CM | POA: Diagnosis not present

## 2017-01-22 ENCOUNTER — Encounter (HOSPITAL_BASED_OUTPATIENT_CLINIC_OR_DEPARTMENT_OTHER): Payer: BLUE CROSS/BLUE SHIELD | Attending: Internal Medicine

## 2017-01-22 DIAGNOSIS — E6609 Other obesity due to excess calories: Secondary | ICD-10-CM | POA: Insufficient documentation

## 2017-01-22 DIAGNOSIS — Z6841 Body Mass Index (BMI) 40.0 and over, adult: Secondary | ICD-10-CM | POA: Diagnosis not present

## 2017-01-22 DIAGNOSIS — I87322 Chronic venous hypertension (idiopathic) with inflammation of left lower extremity: Secondary | ICD-10-CM | POA: Insufficient documentation

## 2017-01-22 DIAGNOSIS — L97221 Non-pressure chronic ulcer of left calf limited to breakdown of skin: Secondary | ICD-10-CM | POA: Diagnosis not present

## 2017-01-22 DIAGNOSIS — E11622 Type 2 diabetes mellitus with other skin ulcer: Secondary | ICD-10-CM | POA: Insufficient documentation

## 2017-01-22 DIAGNOSIS — I890 Lymphedema, not elsewhere classified: Secondary | ICD-10-CM | POA: Diagnosis not present

## 2017-01-28 DIAGNOSIS — E6609 Other obesity due to excess calories: Secondary | ICD-10-CM | POA: Diagnosis not present

## 2017-01-28 DIAGNOSIS — L97221 Non-pressure chronic ulcer of left calf limited to breakdown of skin: Secondary | ICD-10-CM | POA: Diagnosis not present

## 2017-01-28 DIAGNOSIS — Z6841 Body Mass Index (BMI) 40.0 and over, adult: Secondary | ICD-10-CM | POA: Diagnosis not present

## 2017-01-28 DIAGNOSIS — E11622 Type 2 diabetes mellitus with other skin ulcer: Secondary | ICD-10-CM | POA: Diagnosis not present

## 2017-01-28 DIAGNOSIS — I890 Lymphedema, not elsewhere classified: Secondary | ICD-10-CM | POA: Diagnosis not present

## 2017-01-28 DIAGNOSIS — I87322 Chronic venous hypertension (idiopathic) with inflammation of left lower extremity: Secondary | ICD-10-CM | POA: Diagnosis not present

## 2017-01-29 DIAGNOSIS — L97821 Non-pressure chronic ulcer of other part of left lower leg limited to breakdown of skin: Secondary | ICD-10-CM | POA: Diagnosis not present

## 2017-02-12 DIAGNOSIS — L97822 Non-pressure chronic ulcer of other part of left lower leg with fat layer exposed: Secondary | ICD-10-CM | POA: Diagnosis not present

## 2017-02-12 DIAGNOSIS — L97221 Non-pressure chronic ulcer of left calf limited to breakdown of skin: Secondary | ICD-10-CM | POA: Diagnosis not present

## 2017-02-12 DIAGNOSIS — E11622 Type 2 diabetes mellitus with other skin ulcer: Secondary | ICD-10-CM | POA: Diagnosis not present

## 2017-02-12 DIAGNOSIS — E6609 Other obesity due to excess calories: Secondary | ICD-10-CM | POA: Diagnosis not present

## 2017-02-12 DIAGNOSIS — I890 Lymphedema, not elsewhere classified: Secondary | ICD-10-CM | POA: Diagnosis not present

## 2017-02-12 DIAGNOSIS — I87322 Chronic venous hypertension (idiopathic) with inflammation of left lower extremity: Secondary | ICD-10-CM | POA: Diagnosis not present

## 2017-02-12 DIAGNOSIS — Z6841 Body Mass Index (BMI) 40.0 and over, adult: Secondary | ICD-10-CM | POA: Diagnosis not present

## 2017-02-15 DIAGNOSIS — L97821 Non-pressure chronic ulcer of other part of left lower leg limited to breakdown of skin: Secondary | ICD-10-CM | POA: Diagnosis not present

## 2017-02-19 DIAGNOSIS — S81002A Unspecified open wound, left knee, initial encounter: Secondary | ICD-10-CM | POA: Diagnosis not present

## 2017-02-19 DIAGNOSIS — Z6841 Body Mass Index (BMI) 40.0 and over, adult: Secondary | ICD-10-CM | POA: Diagnosis not present

## 2017-02-19 DIAGNOSIS — E6609 Other obesity due to excess calories: Secondary | ICD-10-CM | POA: Diagnosis not present

## 2017-02-19 DIAGNOSIS — E11622 Type 2 diabetes mellitus with other skin ulcer: Secondary | ICD-10-CM | POA: Diagnosis not present

## 2017-02-19 DIAGNOSIS — I890 Lymphedema, not elsewhere classified: Secondary | ICD-10-CM | POA: Diagnosis not present

## 2017-02-19 DIAGNOSIS — L97221 Non-pressure chronic ulcer of left calf limited to breakdown of skin: Secondary | ICD-10-CM | POA: Diagnosis not present

## 2017-02-19 DIAGNOSIS — I87322 Chronic venous hypertension (idiopathic) with inflammation of left lower extremity: Secondary | ICD-10-CM | POA: Diagnosis not present

## 2017-02-25 ENCOUNTER — Encounter (HOSPITAL_BASED_OUTPATIENT_CLINIC_OR_DEPARTMENT_OTHER): Payer: BLUE CROSS/BLUE SHIELD | Attending: Internal Medicine

## 2017-02-25 DIAGNOSIS — E669 Obesity, unspecified: Secondary | ICD-10-CM | POA: Diagnosis not present

## 2017-02-25 DIAGNOSIS — I890 Lymphedema, not elsewhere classified: Secondary | ICD-10-CM | POA: Insufficient documentation

## 2017-02-25 DIAGNOSIS — L89893 Pressure ulcer of other site, stage 3: Secondary | ICD-10-CM | POA: Diagnosis not present

## 2017-02-25 DIAGNOSIS — Z6841 Body Mass Index (BMI) 40.0 and over, adult: Secondary | ICD-10-CM | POA: Insufficient documentation

## 2017-02-25 DIAGNOSIS — L97822 Non-pressure chronic ulcer of other part of left lower leg with fat layer exposed: Secondary | ICD-10-CM | POA: Diagnosis not present

## 2017-02-25 DIAGNOSIS — I87322 Chronic venous hypertension (idiopathic) with inflammation of left lower extremity: Secondary | ICD-10-CM | POA: Diagnosis not present

## 2017-02-25 DIAGNOSIS — E11622 Type 2 diabetes mellitus with other skin ulcer: Secondary | ICD-10-CM | POA: Diagnosis not present

## 2017-03-11 DIAGNOSIS — I87322 Chronic venous hypertension (idiopathic) with inflammation of left lower extremity: Secondary | ICD-10-CM | POA: Diagnosis not present

## 2017-03-11 DIAGNOSIS — Z6841 Body Mass Index (BMI) 40.0 and over, adult: Secondary | ICD-10-CM | POA: Diagnosis not present

## 2017-03-11 DIAGNOSIS — I890 Lymphedema, not elsewhere classified: Secondary | ICD-10-CM | POA: Diagnosis not present

## 2017-03-11 DIAGNOSIS — L89893 Pressure ulcer of other site, stage 3: Secondary | ICD-10-CM | POA: Diagnosis not present

## 2017-03-11 DIAGNOSIS — E669 Obesity, unspecified: Secondary | ICD-10-CM | POA: Diagnosis not present

## 2017-03-11 DIAGNOSIS — E11622 Type 2 diabetes mellitus with other skin ulcer: Secondary | ICD-10-CM | POA: Diagnosis not present

## 2017-03-11 DIAGNOSIS — R600 Localized edema: Secondary | ICD-10-CM | POA: Diagnosis not present

## 2017-03-11 DIAGNOSIS — L97822 Non-pressure chronic ulcer of other part of left lower leg with fat layer exposed: Secondary | ICD-10-CM | POA: Diagnosis not present

## 2017-03-18 DIAGNOSIS — Z6841 Body Mass Index (BMI) 40.0 and over, adult: Secondary | ICD-10-CM | POA: Diagnosis not present

## 2017-03-18 DIAGNOSIS — E669 Obesity, unspecified: Secondary | ICD-10-CM | POA: Diagnosis not present

## 2017-03-18 DIAGNOSIS — E11622 Type 2 diabetes mellitus with other skin ulcer: Secondary | ICD-10-CM | POA: Diagnosis not present

## 2017-03-18 DIAGNOSIS — I890 Lymphedema, not elsewhere classified: Secondary | ICD-10-CM | POA: Diagnosis not present

## 2017-03-18 DIAGNOSIS — L97822 Non-pressure chronic ulcer of other part of left lower leg with fat layer exposed: Secondary | ICD-10-CM | POA: Diagnosis not present

## 2017-03-18 DIAGNOSIS — I87322 Chronic venous hypertension (idiopathic) with inflammation of left lower extremity: Secondary | ICD-10-CM | POA: Diagnosis not present

## 2017-03-20 DIAGNOSIS — E119 Type 2 diabetes mellitus without complications: Secondary | ICD-10-CM | POA: Diagnosis not present

## 2017-03-20 DIAGNOSIS — L309 Dermatitis, unspecified: Secondary | ICD-10-CM | POA: Diagnosis not present

## 2017-03-20 DIAGNOSIS — Z7984 Long term (current) use of oral hypoglycemic drugs: Secondary | ICD-10-CM | POA: Diagnosis not present

## 2017-03-20 DIAGNOSIS — I890 Lymphedema, not elsewhere classified: Secondary | ICD-10-CM | POA: Diagnosis not present

## 2017-03-20 DIAGNOSIS — Z9181 History of falling: Secondary | ICD-10-CM | POA: Diagnosis not present

## 2017-03-20 DIAGNOSIS — S81002D Unspecified open wound, left knee, subsequent encounter: Secondary | ICD-10-CM | POA: Diagnosis not present

## 2017-03-23 DIAGNOSIS — Z9181 History of falling: Secondary | ICD-10-CM | POA: Diagnosis not present

## 2017-03-23 DIAGNOSIS — S81002D Unspecified open wound, left knee, subsequent encounter: Secondary | ICD-10-CM | POA: Diagnosis not present

## 2017-03-23 DIAGNOSIS — Z7984 Long term (current) use of oral hypoglycemic drugs: Secondary | ICD-10-CM | POA: Diagnosis not present

## 2017-03-23 DIAGNOSIS — L309 Dermatitis, unspecified: Secondary | ICD-10-CM | POA: Diagnosis not present

## 2017-03-23 DIAGNOSIS — I890 Lymphedema, not elsewhere classified: Secondary | ICD-10-CM | POA: Diagnosis not present

## 2017-03-23 DIAGNOSIS — E119 Type 2 diabetes mellitus without complications: Secondary | ICD-10-CM | POA: Diagnosis not present

## 2017-03-27 DIAGNOSIS — Z7984 Long term (current) use of oral hypoglycemic drugs: Secondary | ICD-10-CM | POA: Diagnosis not present

## 2017-03-27 DIAGNOSIS — S81002D Unspecified open wound, left knee, subsequent encounter: Secondary | ICD-10-CM | POA: Diagnosis not present

## 2017-03-27 DIAGNOSIS — Z9181 History of falling: Secondary | ICD-10-CM | POA: Diagnosis not present

## 2017-03-27 DIAGNOSIS — I890 Lymphedema, not elsewhere classified: Secondary | ICD-10-CM | POA: Diagnosis not present

## 2017-03-27 DIAGNOSIS — L309 Dermatitis, unspecified: Secondary | ICD-10-CM | POA: Diagnosis not present

## 2017-03-27 DIAGNOSIS — E119 Type 2 diabetes mellitus without complications: Secondary | ICD-10-CM | POA: Diagnosis not present

## 2017-03-30 DIAGNOSIS — L309 Dermatitis, unspecified: Secondary | ICD-10-CM | POA: Diagnosis not present

## 2017-03-30 DIAGNOSIS — E119 Type 2 diabetes mellitus without complications: Secondary | ICD-10-CM | POA: Diagnosis not present

## 2017-03-30 DIAGNOSIS — Z7984 Long term (current) use of oral hypoglycemic drugs: Secondary | ICD-10-CM | POA: Diagnosis not present

## 2017-03-30 DIAGNOSIS — S81002D Unspecified open wound, left knee, subsequent encounter: Secondary | ICD-10-CM | POA: Diagnosis not present

## 2017-03-30 DIAGNOSIS — I890 Lymphedema, not elsewhere classified: Secondary | ICD-10-CM | POA: Diagnosis not present

## 2017-03-30 DIAGNOSIS — Z9181 History of falling: Secondary | ICD-10-CM | POA: Diagnosis not present

## 2017-04-01 ENCOUNTER — Encounter (HOSPITAL_BASED_OUTPATIENT_CLINIC_OR_DEPARTMENT_OTHER): Payer: BLUE CROSS/BLUE SHIELD | Attending: Internal Medicine

## 2017-04-01 DIAGNOSIS — E11622 Type 2 diabetes mellitus with other skin ulcer: Secondary | ICD-10-CM | POA: Insufficient documentation

## 2017-04-01 DIAGNOSIS — L98492 Non-pressure chronic ulcer of skin of other sites with fat layer exposed: Secondary | ICD-10-CM | POA: Insufficient documentation

## 2017-04-01 DIAGNOSIS — I890 Lymphedema, not elsewhere classified: Secondary | ICD-10-CM | POA: Insufficient documentation

## 2017-04-01 DIAGNOSIS — I87322 Chronic venous hypertension (idiopathic) with inflammation of left lower extremity: Secondary | ICD-10-CM | POA: Insufficient documentation

## 2017-04-01 DIAGNOSIS — E669 Obesity, unspecified: Secondary | ICD-10-CM | POA: Insufficient documentation

## 2017-04-01 DIAGNOSIS — Z6841 Body Mass Index (BMI) 40.0 and over, adult: Secondary | ICD-10-CM | POA: Insufficient documentation

## 2017-04-03 DIAGNOSIS — S81002D Unspecified open wound, left knee, subsequent encounter: Secondary | ICD-10-CM | POA: Diagnosis not present

## 2017-04-03 DIAGNOSIS — E119 Type 2 diabetes mellitus without complications: Secondary | ICD-10-CM | POA: Diagnosis not present

## 2017-04-03 DIAGNOSIS — Z7984 Long term (current) use of oral hypoglycemic drugs: Secondary | ICD-10-CM | POA: Diagnosis not present

## 2017-04-03 DIAGNOSIS — I890 Lymphedema, not elsewhere classified: Secondary | ICD-10-CM | POA: Diagnosis not present

## 2017-04-03 DIAGNOSIS — L309 Dermatitis, unspecified: Secondary | ICD-10-CM | POA: Diagnosis not present

## 2017-04-03 DIAGNOSIS — Z9181 History of falling: Secondary | ICD-10-CM | POA: Diagnosis not present

## 2017-04-06 DIAGNOSIS — Z9181 History of falling: Secondary | ICD-10-CM | POA: Diagnosis not present

## 2017-04-06 DIAGNOSIS — L309 Dermatitis, unspecified: Secondary | ICD-10-CM | POA: Diagnosis not present

## 2017-04-06 DIAGNOSIS — Z7984 Long term (current) use of oral hypoglycemic drugs: Secondary | ICD-10-CM | POA: Diagnosis not present

## 2017-04-06 DIAGNOSIS — E119 Type 2 diabetes mellitus without complications: Secondary | ICD-10-CM | POA: Diagnosis not present

## 2017-04-06 DIAGNOSIS — I890 Lymphedema, not elsewhere classified: Secondary | ICD-10-CM | POA: Diagnosis not present

## 2017-04-06 DIAGNOSIS — S81002D Unspecified open wound, left knee, subsequent encounter: Secondary | ICD-10-CM | POA: Diagnosis not present

## 2017-04-08 DIAGNOSIS — R600 Localized edema: Secondary | ICD-10-CM | POA: Diagnosis not present

## 2017-04-08 DIAGNOSIS — I890 Lymphedema, not elsewhere classified: Secondary | ICD-10-CM | POA: Diagnosis not present

## 2017-04-08 DIAGNOSIS — I87322 Chronic venous hypertension (idiopathic) with inflammation of left lower extremity: Secondary | ICD-10-CM | POA: Diagnosis not present

## 2017-04-08 DIAGNOSIS — E669 Obesity, unspecified: Secondary | ICD-10-CM | POA: Diagnosis not present

## 2017-04-08 DIAGNOSIS — L97822 Non-pressure chronic ulcer of other part of left lower leg with fat layer exposed: Secondary | ICD-10-CM | POA: Diagnosis not present

## 2017-04-08 DIAGNOSIS — L98492 Non-pressure chronic ulcer of skin of other sites with fat layer exposed: Secondary | ICD-10-CM | POA: Diagnosis not present

## 2017-04-08 DIAGNOSIS — Z6841 Body Mass Index (BMI) 40.0 and over, adult: Secondary | ICD-10-CM | POA: Diagnosis not present

## 2017-04-08 DIAGNOSIS — E11622 Type 2 diabetes mellitus with other skin ulcer: Secondary | ICD-10-CM | POA: Diagnosis not present

## 2017-04-10 DIAGNOSIS — Z9181 History of falling: Secondary | ICD-10-CM | POA: Diagnosis not present

## 2017-04-10 DIAGNOSIS — I890 Lymphedema, not elsewhere classified: Secondary | ICD-10-CM | POA: Diagnosis not present

## 2017-04-10 DIAGNOSIS — E119 Type 2 diabetes mellitus without complications: Secondary | ICD-10-CM | POA: Diagnosis not present

## 2017-04-10 DIAGNOSIS — S81002D Unspecified open wound, left knee, subsequent encounter: Secondary | ICD-10-CM | POA: Diagnosis not present

## 2017-04-10 DIAGNOSIS — Z7984 Long term (current) use of oral hypoglycemic drugs: Secondary | ICD-10-CM | POA: Diagnosis not present

## 2017-04-10 DIAGNOSIS — L309 Dermatitis, unspecified: Secondary | ICD-10-CM | POA: Diagnosis not present

## 2017-04-13 DIAGNOSIS — E119 Type 2 diabetes mellitus without complications: Secondary | ICD-10-CM | POA: Diagnosis not present

## 2017-04-13 DIAGNOSIS — S81002D Unspecified open wound, left knee, subsequent encounter: Secondary | ICD-10-CM | POA: Diagnosis not present

## 2017-04-13 DIAGNOSIS — I890 Lymphedema, not elsewhere classified: Secondary | ICD-10-CM | POA: Diagnosis not present

## 2017-04-13 DIAGNOSIS — Z9181 History of falling: Secondary | ICD-10-CM | POA: Diagnosis not present

## 2017-04-13 DIAGNOSIS — L309 Dermatitis, unspecified: Secondary | ICD-10-CM | POA: Diagnosis not present

## 2017-04-13 DIAGNOSIS — Z7984 Long term (current) use of oral hypoglycemic drugs: Secondary | ICD-10-CM | POA: Diagnosis not present

## 2017-04-15 DIAGNOSIS — Z6841 Body Mass Index (BMI) 40.0 and over, adult: Secondary | ICD-10-CM | POA: Diagnosis not present

## 2017-04-15 DIAGNOSIS — I87322 Chronic venous hypertension (idiopathic) with inflammation of left lower extremity: Secondary | ICD-10-CM | POA: Diagnosis not present

## 2017-04-15 DIAGNOSIS — L98492 Non-pressure chronic ulcer of skin of other sites with fat layer exposed: Secondary | ICD-10-CM | POA: Diagnosis not present

## 2017-04-15 DIAGNOSIS — L97822 Non-pressure chronic ulcer of other part of left lower leg with fat layer exposed: Secondary | ICD-10-CM | POA: Diagnosis not present

## 2017-04-15 DIAGNOSIS — E11622 Type 2 diabetes mellitus with other skin ulcer: Secondary | ICD-10-CM | POA: Diagnosis not present

## 2017-04-15 DIAGNOSIS — E669 Obesity, unspecified: Secondary | ICD-10-CM | POA: Diagnosis not present

## 2017-04-15 DIAGNOSIS — I890 Lymphedema, not elsewhere classified: Secondary | ICD-10-CM | POA: Diagnosis not present

## 2017-04-17 DIAGNOSIS — S81002D Unspecified open wound, left knee, subsequent encounter: Secondary | ICD-10-CM | POA: Diagnosis not present

## 2017-04-17 DIAGNOSIS — Z9181 History of falling: Secondary | ICD-10-CM | POA: Diagnosis not present

## 2017-04-17 DIAGNOSIS — Z7984 Long term (current) use of oral hypoglycemic drugs: Secondary | ICD-10-CM | POA: Diagnosis not present

## 2017-04-17 DIAGNOSIS — L309 Dermatitis, unspecified: Secondary | ICD-10-CM | POA: Diagnosis not present

## 2017-04-17 DIAGNOSIS — E119 Type 2 diabetes mellitus without complications: Secondary | ICD-10-CM | POA: Diagnosis not present

## 2017-04-17 DIAGNOSIS — I890 Lymphedema, not elsewhere classified: Secondary | ICD-10-CM | POA: Diagnosis not present

## 2017-04-20 DIAGNOSIS — L309 Dermatitis, unspecified: Secondary | ICD-10-CM | POA: Diagnosis not present

## 2017-04-20 DIAGNOSIS — S81002D Unspecified open wound, left knee, subsequent encounter: Secondary | ICD-10-CM | POA: Diagnosis not present

## 2017-04-20 DIAGNOSIS — Z9181 History of falling: Secondary | ICD-10-CM | POA: Diagnosis not present

## 2017-04-20 DIAGNOSIS — Z7984 Long term (current) use of oral hypoglycemic drugs: Secondary | ICD-10-CM | POA: Diagnosis not present

## 2017-04-20 DIAGNOSIS — I890 Lymphedema, not elsewhere classified: Secondary | ICD-10-CM | POA: Diagnosis not present

## 2017-04-20 DIAGNOSIS — E119 Type 2 diabetes mellitus without complications: Secondary | ICD-10-CM | POA: Diagnosis not present

## 2017-04-22 ENCOUNTER — Encounter (HOSPITAL_BASED_OUTPATIENT_CLINIC_OR_DEPARTMENT_OTHER): Payer: BLUE CROSS/BLUE SHIELD | Attending: Internal Medicine

## 2017-04-22 DIAGNOSIS — I890 Lymphedema, not elsewhere classified: Secondary | ICD-10-CM | POA: Insufficient documentation

## 2017-04-22 DIAGNOSIS — Z6841 Body Mass Index (BMI) 40.0 and over, adult: Secondary | ICD-10-CM | POA: Insufficient documentation

## 2017-04-22 DIAGNOSIS — B9561 Methicillin susceptible Staphylococcus aureus infection as the cause of diseases classified elsewhere: Secondary | ICD-10-CM | POA: Insufficient documentation

## 2017-04-22 DIAGNOSIS — I87312 Chronic venous hypertension (idiopathic) with ulcer of left lower extremity: Secondary | ICD-10-CM | POA: Diagnosis not present

## 2017-04-22 DIAGNOSIS — E11622 Type 2 diabetes mellitus with other skin ulcer: Secondary | ICD-10-CM | POA: Diagnosis not present

## 2017-04-22 DIAGNOSIS — E669 Obesity, unspecified: Secondary | ICD-10-CM | POA: Diagnosis not present

## 2017-04-22 DIAGNOSIS — L97822 Non-pressure chronic ulcer of other part of left lower leg with fat layer exposed: Secondary | ICD-10-CM | POA: Insufficient documentation

## 2017-04-27 DIAGNOSIS — S81002D Unspecified open wound, left knee, subsequent encounter: Secondary | ICD-10-CM | POA: Diagnosis not present

## 2017-04-27 DIAGNOSIS — Z9181 History of falling: Secondary | ICD-10-CM | POA: Diagnosis not present

## 2017-04-27 DIAGNOSIS — Z7984 Long term (current) use of oral hypoglycemic drugs: Secondary | ICD-10-CM | POA: Diagnosis not present

## 2017-04-27 DIAGNOSIS — E119 Type 2 diabetes mellitus without complications: Secondary | ICD-10-CM | POA: Diagnosis not present

## 2017-04-27 DIAGNOSIS — L309 Dermatitis, unspecified: Secondary | ICD-10-CM | POA: Diagnosis not present

## 2017-04-27 DIAGNOSIS — I890 Lymphedema, not elsewhere classified: Secondary | ICD-10-CM | POA: Diagnosis not present

## 2017-04-29 ENCOUNTER — Other Ambulatory Visit: Payer: Self-pay | Admitting: Internal Medicine

## 2017-04-29 DIAGNOSIS — L97822 Non-pressure chronic ulcer of other part of left lower leg with fat layer exposed: Secondary | ICD-10-CM | POA: Diagnosis not present

## 2017-04-29 DIAGNOSIS — E669 Obesity, unspecified: Secondary | ICD-10-CM | POA: Diagnosis not present

## 2017-04-29 DIAGNOSIS — B9561 Methicillin susceptible Staphylococcus aureus infection as the cause of diseases classified elsewhere: Secondary | ICD-10-CM | POA: Diagnosis not present

## 2017-04-29 DIAGNOSIS — L97929 Non-pressure chronic ulcer of unspecified part of left lower leg with unspecified severity: Secondary | ICD-10-CM | POA: Diagnosis not present

## 2017-04-29 DIAGNOSIS — I87312 Chronic venous hypertension (idiopathic) with ulcer of left lower extremity: Secondary | ICD-10-CM | POA: Diagnosis not present

## 2017-04-29 DIAGNOSIS — I890 Lymphedema, not elsewhere classified: Secondary | ICD-10-CM | POA: Diagnosis not present

## 2017-04-29 DIAGNOSIS — Z6841 Body Mass Index (BMI) 40.0 and over, adult: Secondary | ICD-10-CM | POA: Diagnosis not present

## 2017-04-29 DIAGNOSIS — E11622 Type 2 diabetes mellitus with other skin ulcer: Secondary | ICD-10-CM | POA: Diagnosis not present

## 2017-04-30 ENCOUNTER — Other Ambulatory Visit (HOSPITAL_COMMUNITY)
Admission: RE | Admit: 2017-04-30 | Discharge: 2017-04-30 | Disposition: A | Discharge status: Home / Self Care | Payer: BLUE CROSS/BLUE SHIELD | Source: Other Acute Inpatient Hospital | Attending: Internal Medicine | Admitting: Internal Medicine

## 2017-04-30 DIAGNOSIS — E11622 Type 2 diabetes mellitus with other skin ulcer: Secondary | ICD-10-CM | POA: Insufficient documentation

## 2017-04-30 DIAGNOSIS — L97221 Non-pressure chronic ulcer of left calf limited to breakdown of skin: Secondary | ICD-10-CM | POA: Insufficient documentation

## 2017-04-30 DIAGNOSIS — I780 Hereditary hemorrhagic telangiectasia: Secondary | ICD-10-CM | POA: Diagnosis not present

## 2017-05-01 DIAGNOSIS — Z7984 Long term (current) use of oral hypoglycemic drugs: Secondary | ICD-10-CM | POA: Diagnosis not present

## 2017-05-01 DIAGNOSIS — L309 Dermatitis, unspecified: Secondary | ICD-10-CM | POA: Diagnosis not present

## 2017-05-01 DIAGNOSIS — Z9181 History of falling: Secondary | ICD-10-CM | POA: Diagnosis not present

## 2017-05-01 DIAGNOSIS — I890 Lymphedema, not elsewhere classified: Secondary | ICD-10-CM | POA: Diagnosis not present

## 2017-05-01 DIAGNOSIS — S81002D Unspecified open wound, left knee, subsequent encounter: Secondary | ICD-10-CM | POA: Diagnosis not present

## 2017-05-01 DIAGNOSIS — E119 Type 2 diabetes mellitus without complications: Secondary | ICD-10-CM | POA: Diagnosis not present

## 2017-05-03 LAB — AEROBIC CULTURE W GRAM STAIN (SUPERFICIAL SPECIMEN)

## 2017-05-03 LAB — AEROBIC CULTURE  (SUPERFICIAL SPECIMEN)

## 2017-05-05 DIAGNOSIS — L309 Dermatitis, unspecified: Secondary | ICD-10-CM | POA: Diagnosis not present

## 2017-05-05 DIAGNOSIS — E119 Type 2 diabetes mellitus without complications: Secondary | ICD-10-CM | POA: Diagnosis not present

## 2017-05-05 DIAGNOSIS — S81002D Unspecified open wound, left knee, subsequent encounter: Secondary | ICD-10-CM | POA: Diagnosis not present

## 2017-05-05 DIAGNOSIS — I890 Lymphedema, not elsewhere classified: Secondary | ICD-10-CM | POA: Diagnosis not present

## 2017-05-05 DIAGNOSIS — Z9181 History of falling: Secondary | ICD-10-CM | POA: Diagnosis not present

## 2017-05-05 DIAGNOSIS — Z7984 Long term (current) use of oral hypoglycemic drugs: Secondary | ICD-10-CM | POA: Diagnosis not present

## 2017-05-05 LAB — ANAEROBIC CULTURE

## 2017-05-08 DIAGNOSIS — S81002D Unspecified open wound, left knee, subsequent encounter: Secondary | ICD-10-CM | POA: Diagnosis not present

## 2017-05-08 DIAGNOSIS — Z7984 Long term (current) use of oral hypoglycemic drugs: Secondary | ICD-10-CM | POA: Diagnosis not present

## 2017-05-08 DIAGNOSIS — L309 Dermatitis, unspecified: Secondary | ICD-10-CM | POA: Diagnosis not present

## 2017-05-08 DIAGNOSIS — E119 Type 2 diabetes mellitus without complications: Secondary | ICD-10-CM | POA: Diagnosis not present

## 2017-05-08 DIAGNOSIS — I890 Lymphedema, not elsewhere classified: Secondary | ICD-10-CM | POA: Diagnosis not present

## 2017-05-08 DIAGNOSIS — Z9181 History of falling: Secondary | ICD-10-CM | POA: Diagnosis not present

## 2017-05-11 DIAGNOSIS — Z7984 Long term (current) use of oral hypoglycemic drugs: Secondary | ICD-10-CM | POA: Diagnosis not present

## 2017-05-11 DIAGNOSIS — E119 Type 2 diabetes mellitus without complications: Secondary | ICD-10-CM | POA: Diagnosis not present

## 2017-05-11 DIAGNOSIS — S81002D Unspecified open wound, left knee, subsequent encounter: Secondary | ICD-10-CM | POA: Diagnosis not present

## 2017-05-11 DIAGNOSIS — Z9181 History of falling: Secondary | ICD-10-CM | POA: Diagnosis not present

## 2017-05-11 DIAGNOSIS — L309 Dermatitis, unspecified: Secondary | ICD-10-CM | POA: Diagnosis not present

## 2017-05-11 DIAGNOSIS — I890 Lymphedema, not elsewhere classified: Secondary | ICD-10-CM | POA: Diagnosis not present

## 2017-05-20 DIAGNOSIS — E669 Obesity, unspecified: Secondary | ICD-10-CM | POA: Diagnosis not present

## 2017-05-20 DIAGNOSIS — B9561 Methicillin susceptible Staphylococcus aureus infection as the cause of diseases classified elsewhere: Secondary | ICD-10-CM | POA: Diagnosis not present

## 2017-05-20 DIAGNOSIS — L97822 Non-pressure chronic ulcer of other part of left lower leg with fat layer exposed: Secondary | ICD-10-CM | POA: Diagnosis not present

## 2017-05-20 DIAGNOSIS — E11622 Type 2 diabetes mellitus with other skin ulcer: Secondary | ICD-10-CM | POA: Diagnosis not present

## 2017-05-20 DIAGNOSIS — I87312 Chronic venous hypertension (idiopathic) with ulcer of left lower extremity: Secondary | ICD-10-CM | POA: Diagnosis not present

## 2017-05-20 DIAGNOSIS — I890 Lymphedema, not elsewhere classified: Secondary | ICD-10-CM | POA: Diagnosis not present

## 2017-05-20 DIAGNOSIS — Z6841 Body Mass Index (BMI) 40.0 and over, adult: Secondary | ICD-10-CM | POA: Diagnosis not present

## 2017-05-25 DIAGNOSIS — E118 Type 2 diabetes mellitus with unspecified complications: Secondary | ICD-10-CM | POA: Diagnosis not present

## 2017-05-25 DIAGNOSIS — M25562 Pain in left knee: Secondary | ICD-10-CM | POA: Diagnosis not present

## 2017-05-25 DIAGNOSIS — F331 Major depressive disorder, recurrent, moderate: Secondary | ICD-10-CM | POA: Diagnosis not present

## 2017-05-27 ENCOUNTER — Encounter (HOSPITAL_BASED_OUTPATIENT_CLINIC_OR_DEPARTMENT_OTHER): Payer: BLUE CROSS/BLUE SHIELD | Attending: Internal Medicine

## 2017-05-27 DIAGNOSIS — E11622 Type 2 diabetes mellitus with other skin ulcer: Secondary | ICD-10-CM | POA: Insufficient documentation

## 2017-05-27 DIAGNOSIS — L97221 Non-pressure chronic ulcer of left calf limited to breakdown of skin: Secondary | ICD-10-CM | POA: Insufficient documentation

## 2017-05-27 DIAGNOSIS — Z9181 History of falling: Secondary | ICD-10-CM | POA: Diagnosis not present

## 2017-05-27 DIAGNOSIS — Z6841 Body Mass Index (BMI) 40.0 and over, adult: Secondary | ICD-10-CM | POA: Insufficient documentation

## 2017-05-27 DIAGNOSIS — L309 Dermatitis, unspecified: Secondary | ICD-10-CM | POA: Diagnosis not present

## 2017-05-27 DIAGNOSIS — S81002D Unspecified open wound, left knee, subsequent encounter: Secondary | ICD-10-CM | POA: Diagnosis not present

## 2017-05-27 DIAGNOSIS — Z7984 Long term (current) use of oral hypoglycemic drugs: Secondary | ICD-10-CM | POA: Diagnosis not present

## 2017-05-27 DIAGNOSIS — I890 Lymphedema, not elsewhere classified: Secondary | ICD-10-CM | POA: Diagnosis not present

## 2017-05-27 DIAGNOSIS — E119 Type 2 diabetes mellitus without complications: Secondary | ICD-10-CM | POA: Diagnosis not present

## 2017-05-27 DIAGNOSIS — E6609 Other obesity due to excess calories: Secondary | ICD-10-CM | POA: Insufficient documentation

## 2017-05-27 DIAGNOSIS — I87322 Chronic venous hypertension (idiopathic) with inflammation of left lower extremity: Secondary | ICD-10-CM | POA: Insufficient documentation

## 2017-05-28 DIAGNOSIS — R269 Unspecified abnormalities of gait and mobility: Secondary | ICD-10-CM | POA: Diagnosis not present

## 2017-05-28 DIAGNOSIS — R2689 Other abnormalities of gait and mobility: Secondary | ICD-10-CM | POA: Diagnosis not present

## 2017-06-10 ENCOUNTER — Emergency Department (HOSPITAL_COMMUNITY): Payer: BLUE CROSS/BLUE SHIELD

## 2017-06-10 ENCOUNTER — Emergency Department (HOSPITAL_COMMUNITY)
Admission: EM | Admit: 2017-06-10 | Discharge: 2017-06-11 | Disposition: A | Discharge status: Home / Self Care | Payer: BLUE CROSS/BLUE SHIELD | Attending: Emergency Medicine | Admitting: Emergency Medicine

## 2017-06-10 ENCOUNTER — Encounter (HOSPITAL_COMMUNITY): Payer: Self-pay | Admitting: Emergency Medicine

## 2017-06-10 ENCOUNTER — Other Ambulatory Visit: Payer: Self-pay

## 2017-06-10 ENCOUNTER — Other Ambulatory Visit: Payer: Self-pay | Admitting: Internal Medicine

## 2017-06-10 DIAGNOSIS — N3000 Acute cystitis without hematuria: Secondary | ICD-10-CM | POA: Diagnosis not present

## 2017-06-10 DIAGNOSIS — E6609 Other obesity due to excess calories: Secondary | ICD-10-CM | POA: Diagnosis not present

## 2017-06-10 DIAGNOSIS — E119 Type 2 diabetes mellitus without complications: Secondary | ICD-10-CM | POA: Insufficient documentation

## 2017-06-10 DIAGNOSIS — Y999 Unspecified external cause status: Secondary | ICD-10-CM | POA: Diagnosis not present

## 2017-06-10 DIAGNOSIS — I890 Lymphedema, not elsewhere classified: Secondary | ICD-10-CM | POA: Insufficient documentation

## 2017-06-10 DIAGNOSIS — Z6841 Body Mass Index (BMI) 40.0 and over, adult: Secondary | ICD-10-CM | POA: Diagnosis not present

## 2017-06-10 DIAGNOSIS — Y929 Unspecified place or not applicable: Secondary | ICD-10-CM | POA: Insufficient documentation

## 2017-06-10 DIAGNOSIS — S81802A Unspecified open wound, left lower leg, initial encounter: Secondary | ICD-10-CM | POA: Diagnosis not present

## 2017-06-10 DIAGNOSIS — I87322 Chronic venous hypertension (idiopathic) with inflammation of left lower extremity: Secondary | ICD-10-CM | POA: Diagnosis not present

## 2017-06-10 DIAGNOSIS — L97822 Non-pressure chronic ulcer of other part of left lower leg with fat layer exposed: Secondary | ICD-10-CM | POA: Diagnosis not present

## 2017-06-10 DIAGNOSIS — Z79899 Other long term (current) drug therapy: Secondary | ICD-10-CM | POA: Insufficient documentation

## 2017-06-10 DIAGNOSIS — L2489 Irritant contact dermatitis due to other agents: Secondary | ICD-10-CM | POA: Diagnosis not present

## 2017-06-10 DIAGNOSIS — X58XXXA Exposure to other specified factors, initial encounter: Secondary | ICD-10-CM | POA: Diagnosis not present

## 2017-06-10 DIAGNOSIS — Y939 Activity, unspecified: Secondary | ICD-10-CM | POA: Insufficient documentation

## 2017-06-10 DIAGNOSIS — L97221 Non-pressure chronic ulcer of left calf limited to breakdown of skin: Secondary | ICD-10-CM | POA: Diagnosis not present

## 2017-06-10 DIAGNOSIS — Z7984 Long term (current) use of oral hypoglycemic drugs: Secondary | ICD-10-CM | POA: Insufficient documentation

## 2017-06-10 DIAGNOSIS — I872 Venous insufficiency (chronic) (peripheral): Secondary | ICD-10-CM | POA: Diagnosis not present

## 2017-06-10 DIAGNOSIS — E11622 Type 2 diabetes mellitus with other skin ulcer: Secondary | ICD-10-CM | POA: Diagnosis not present

## 2017-06-10 DIAGNOSIS — L308 Other specified dermatitis: Secondary | ICD-10-CM | POA: Diagnosis not present

## 2017-06-10 LAB — COMPREHENSIVE METABOLIC PANEL
ALT: 14 U/L (ref 14–54)
ANION GAP: 9 (ref 5–15)
AST: 19 U/L (ref 15–41)
Albumin: 3.7 g/dL (ref 3.5–5.0)
Alkaline Phosphatase: 88 U/L (ref 38–126)
BUN: 14 mg/dL (ref 6–20)
CHLORIDE: 104 mmol/L (ref 101–111)
CO2: 24 mmol/L (ref 22–32)
Calcium: 8.9 mg/dL (ref 8.9–10.3)
Creatinine, Ser: 0.72 mg/dL (ref 0.44–1.00)
GFR calc Af Amer: >60 mL/min (ref >60)
GFR calc non Af Amer: >60 mL/min (ref >60)
GLUCOSE: 119 mg/dL — AB (ref 65–99)
POTASSIUM: 3.5 mmol/L (ref 3.5–5.1)
SODIUM: 137 mmol/L (ref 135–145)
Total Bilirubin: 0.8 mg/dL (ref 0.3–1.2)
Total Protein: 8.4 g/dL — ABNORMAL HIGH (ref 6.5–8.1)

## 2017-06-10 LAB — URINALYSIS, ROUTINE W REFLEX MICROSCOPIC
BILIRUBIN URINE: NEGATIVE
Glucose, UA: NEGATIVE mg/dL
Ketones, ur: NEGATIVE mg/dL
Nitrite: POSITIVE — AB
PROTEIN: 30 mg/dL — AB
Specific Gravity, Urine: 1.033 — ABNORMAL HIGH (ref 1.005–1.030)
pH: 5 (ref 5.0–8.0)

## 2017-06-10 LAB — CBC WITH DIFFERENTIAL/PLATELET
Basophils Absolute: 0 10*3/uL (ref 0.0–0.1)
Basophils Relative: 0 %
EOS PCT: 4 %
Eosinophils Absolute: 0.3 10*3/uL (ref 0.0–0.7)
HEMATOCRIT: 37.8 % (ref 36.0–46.0)
Hemoglobin: 11.5 g/dL — ABNORMAL LOW (ref 12.0–15.0)
LYMPHS ABS: 2.1 10*3/uL (ref 0.7–4.0)
LYMPHS PCT: 26 %
MCH: 28 pg (ref 26.0–34.0)
MCHC: 30.4 g/dL (ref 30.0–36.0)
MCV: 92.2 fL (ref 78.0–100.0)
MONO ABS: 0.5 10*3/uL (ref 0.1–1.0)
MONOS PCT: 6 %
NEUTROS ABS: 5.3 10*3/uL (ref 1.7–7.7)
Neutrophils Relative %: 64 %
Platelets: 363 10*3/uL (ref 150–400)
RBC: 4.1 MIL/uL (ref 3.87–5.11)
RDW: 16.7 % — AB (ref 11.5–15.5)
WBC: 8.2 10*3/uL (ref 4.0–10.5)

## 2017-06-10 MED ORDER — DULOXETINE HCL 30 MG PO CPEP
60.0000 mg | ORAL_CAPSULE | Freq: Every day | ORAL | Status: DC
  Administered 2017-06-10 – 2017-06-11 (×2): 60 mg via ORAL
  Filled 2017-06-10 (×2): qty 2

## 2017-06-10 MED ORDER — ACETAMINOPHEN 500 MG PO TABS
1000.0000 mg | ORAL_TABLET | Freq: Once | ORAL | Status: AC
  Administered 2017-06-10: 1000 mg via ORAL
  Filled 2017-06-10: qty 2

## 2017-06-10 MED ORDER — MELATONIN 10 MG PO TABS: 10.0000 mg | ORAL_TABLET | Freq: Every day | ORAL | Status: DC

## 2017-06-10 MED ORDER — DOXYCYCLINE HYCLATE 100 MG PO TABS
100.0000 mg | ORAL_TABLET | Freq: Two times a day (BID) | ORAL | Status: DC
  Administered 2017-06-10 – 2017-06-11 (×2): 100 mg via ORAL
  Filled 2017-06-10 (×2): qty 1

## 2017-06-10 MED ORDER — METRONIDAZOLE IN NACL 5-0.79 MG/ML-% IV SOLN
500.0000 mg | Freq: Three times a day (TID) | INTRAVENOUS | Status: DC
  Administered 2017-06-10 – 2017-06-11 (×2): 500 mg via INTRAVENOUS
  Filled 2017-06-10 (×2): qty 100

## 2017-06-10 MED ORDER — METFORMIN HCL ER 500 MG PO TB24
1000.0000 mg | ORAL_TABLET | Freq: Two times a day (BID) | ORAL | Status: DC
  Filled 2017-06-10 (×3): qty 2

## 2017-06-10 MED ORDER — OXYCODONE HCL 5 MG PO TABS
5.0000 mg | ORAL_TABLET | Freq: Once | ORAL | Status: AC
  Administered 2017-06-10: 5 mg via ORAL
  Filled 2017-06-10: qty 1

## 2017-06-10 MED ORDER — DEXTROSE 5 % IV SOLN
2.0000 g | INTRAVENOUS | Status: DC
  Administered 2017-06-10: 2 g via INTRAVENOUS
  Filled 2017-06-10: qty 2

## 2017-06-10 NOTE — Progress Notes (Addendum)
Consult request has been received. CSW attempting to follow up at present time.   CSW received consult for "access to meds" which is not a CSW issue and CSW notified ED CM.  ED CM noted this and will forward that need to CM on medical floor.  Please reconsult if future social work needs arise.  CSW signing off, as social work intervention is no longer needed.  Alphonse Guild. Avon Mergenthaler, LCSW, LCAS, CSI Clinical Social Worker Ph: [phone]

## 2017-06-10 NOTE — ED Provider Notes (Addendum)
[name] DEPT Provider Note   CSN: [id number] Arrival date & time: 06/10/17  1322     History   Chief Complaint Chief Complaint  Patient presents with  . Wound Check    HPI [name] is a 51 y.o. female.  51 yo F with a chief complaint of a wound to the left posterior leg.  This is been going on for over a year.  She has been seen at the wound care center.  She saw them today and they were concerned that it was much worse than 3 weeks ago and she is now unable to take care of herself so they sent her to the hospital.  The patient has had trouble getting out of bed due to leg pain.  She has had some subjective fevers and chills.  She has lymphedema but was unable to wrap her legs because she was worried it would make her urinate more in with her unable to get up she was worried about urinating on herself.  She has stopped eating because she is worried that she is getting soiled herself.  She denies nausea or vomiting.   The history is provided by the patient.  Wound Check  This is a chronic problem. The current episode started more than 1 week ago. The problem occurs constantly. The problem has been gradually worsening. Pertinent negatives include no chest pain, no headaches and no shortness of breath. Nothing aggravates the symptoms. Nothing relieves the symptoms. She has tried nothing for the symptoms. The treatment provided no relief.    Past Medical History:  Diagnosis Date  . Arthritis    knees, hips, tx with otc med  . Depression    History in 2004, no current prob, no meds    Patient Active Problem List   Diagnosis Date Noted  . S/P hysterectomy 08/13/2011    Past Surgical History:  Procedure Laterality Date  . arthroscopic knee  1998   right knee  . Excision of 4 areas of hidradenitis in the right axilla   1986 x 04/25/2004   x 2  . OVARIAN CYST REMOVAL  08/11/2011   Procedure: OVARIAN CYSTECTOMY;  Surgeon: Marvene Staff, MD;  Location: [facility];  Service: Gynecology;  Laterality: Left;  . TONSILLECTOMY     as 51 yrs old  . WISDOM TOOTH EXTRACTION      OB History    No data available       Home Medications    Prior to Admission medications   Medication Sig Start Date End Date Taking? Authorizing Provider  acetaminophen (TYLENOL) 650 MG CR tablet Take 2,600 mg by mouth 2 (two) times daily.   Yes [provider]  DULoxetine (CYMBALTA) 60 MG capsule Take 60 mg by mouth daily.   Yes [provider]  Melatonin 10 MG TABS Take 10 mg by mouth at bedtime.   Yes [provider]  metFORMIN (GLUCOPHAGE-XR) 500 MG 24 hr tablet Take 1,000 mg by mouth 2 (two) times daily with a meal. 05/25/17  Yes [provider]  naproxen (NAPROSYN) 500 MG tablet Take 500 mg by mouth 2 (two) times daily. 05/25/17  Yes [provider]  naproxen sodium (ALEVE) 220 MG tablet Take 660 mg by mouth 2 (two) times daily.   Yes [provider]  OVER THE COUNTER MEDICATION Apply 1 application topically daily as needed (DRY SKIN). DIABETIC CREAM    Yes [provider]  HYDROcodone-acetaminophen (VICODIN) 5-500  MG per tablet Take 1 tablet by mouth every 4 (four) hours as needed. Patient not taking: Reported on 06/10/2017 08/12/11   Servando Salina, MD    Family History No family history on file.  Social History Social History   Tobacco Use  . Smoking status: Passive Smoke Exposure - Never Smoker  . Smokeless tobacco: Never Used  Substance Use Topics  . Alcohol use: Yes    Comment: pt states drinks " 2-3 bottles of wine per week"  . Drug use: No     Allergies   Patient has no known allergies.   Review of Systems Review of Systems  Constitutional: Negative for chills and fever.  HENT: Negative for congestion and rhinorrhea.   Eyes: Negative for redness and visual disturbance.  Respiratory: Negative for shortness of breath and wheezing.   Cardiovascular:  Negative for chest pain and palpitations.  Gastrointestinal: Negative for nausea and vomiting.  Genitourinary: Negative for dysuria and urgency.  Musculoskeletal: Negative for arthralgias and myalgias.  Skin: Positive for wound. Negative for pallor.  Neurological: Negative for dizziness and headaches.     Physical Exam Updated Vital Signs BP (!) 161/85 (BP Location: Left Arm)   Pulse 88   Temp 98.5 F (36.9 C) (Oral)   Resp 18   SpO2 98%   Physical Exam  Constitutional: She is oriented to person, place, and time. She appears well-developed and well-nourished. No distress.  Morbidly obese  HENT:  Head: Normocephalic and atraumatic.  Eyes: EOM are normal. Pupils are equal, round, and reactive to light.  Neck: Normal range of motion. Neck supple.  Cardiovascular: Normal rate and regular rhythm. Exam reveals no gallop and no friction rub.  No murmur heard. Pulmonary/Chest: Effort normal. She has no wheezes. She has no rales.  Abdominal: Soft. She exhibits no distension and no mass. There is no tenderness. There is no guarding.  Musculoskeletal: She exhibits no edema or tenderness.  Wound to left posterior knee.  Large erosion, trace purulent drainage. Mild surrounding erythema.    Neurological: She is alert and oriented to person, place, and time.  Skin: Skin is warm and dry. She is not diaphoretic.  Psychiatric: She has a normal mood and affect. Her behavior is normal.  Nursing note and vitals reviewed.    ED Treatments / Results  Labs (all labs ordered are listed, but only abnormal results are displayed) Labs Reviewed  COMPREHENSIVE METABOLIC PANEL - Abnormal; Notable for the following components:      Result Value   Glucose, Bld 119 (*)    Total Protein 8.4 (*)    All other components within normal limits  CBC WITH DIFFERENTIAL/PLATELET - Abnormal; Notable for the following components:   Hemoglobin 11.5 (*)    RDW 16.7 (*)    All other components within normal limits    URINALYSIS, ROUTINE W REFLEX MICROSCOPIC - Abnormal; Notable for the following components:   Color, Urine AMBER (*)    APPearance HAZY (*)    Specific Gravity, Urine 1.033 (*)    Hgb urine dipstick LARGE (*)    Protein, ur 30 (*)    Nitrite POSITIVE (*)    Leukocytes, UA LARGE (*)    Bacteria, UA RARE (*)    Squamous Epithelial / LPF 0-5 (*)    All other components within normal limits  CULTURE, BLOOD (ROUTINE X 2)  CULTURE, BLOOD (ROUTINE X 2)  HEMOGLOBIN A1C    EKG  EKG Interpretation None  Radiology Dg Knee Complete 4 Views Left  Result Date: 06/10/2017 CLINICAL DATA:  Wound check.  No injury. EXAM: LEFT KNEE - COMPLETE 4+ VIEW COMPARISON:  None. FINDINGS: Examination demonstrates prominent overlying soft tissues. There is diffuse osteopenia. There is mild to moderate tricompartmental osteoarthritic change. Possible soft tissue ulceration over the popliteal fossa. IMPRESSION: Possible soft tissue ulceration over the popliteal fossa. Recommend clinical correlation. Mild-to-moderate tricompartmental osteoarthritic change. Electronically Signed   By: Marin Olp M.D.   On: 06/10/2017 17:26    Procedures Procedures (including critical care time)  Medications Ordered in ED Medications  cefTRIAXone (ROCEPHIN) 2 g in dextrose 5 % 50 mL IVPB (0 g Intravenous Stopped 06/10/17 1905)    And  metroNIDAZOLE (FLAGYL) IVPB 500 mg (500 mg Intravenous New Bag/Given 06/10/17 1933)  DULoxetine (CYMBALTA) DR capsule 60 mg (not administered)  Melatonin TABS 10 mg (not administered)  metFORMIN (GLUCOPHAGE-XR) 24 hr tablet 1,000 mg (not administered)  acetaminophen (TYLENOL) tablet 1,000 mg (1,000 mg Oral Given 06/10/17 2045)  oxyCODONE (Oxy IR/ROXICODONE) immediate release tablet 5 mg (5 mg Oral Given 06/10/17 2045)     Initial Impression / Assessment and Plan / ED Course  I have reviewed the triage vital signs and the nursing notes.  Pertinent labs & imaging results that were  available during my care of the patient were reviewed by me and considered in my medical decision making (see chart for details).     51 yo F with a chief complaint of left leg pain going on for the past year.  Significantly worsening in the past month where she is now having trouble getting out of bed.  She has been soiling herself in the bed.  She saw wound care who was concerned for acute worsening of the wound.  She has had multiple biopsies of it and is not deemed to be cancerous.  She also has morbid obesity diabetes and lymphedema complicating her wound care management.  On my exam I am concerned that the wound may be infected.  Will start on IV antibiotics.  Will discuss with the hospitalist.  Discussed with Dr. Olevia Bowens, he did not feel that this patient met criteria for inpatient admission.  Patient does not have leukocytosis is afebrile.  She has not had a trial of outpatient antibiotics yet.  We will have social work evaluate for possible placement.  Patient likely to be placed in the morning.  [facility] social worker to handle case in morning as no social worker will be here in the morning.   The patients results and plan were reviewed and discussed.   Any x-rays performed were independently reviewed by myself.   Differential diagnosis were considered with the presenting HPI.  Medications  cefTRIAXone (ROCEPHIN) 2 g in dextrose 5 % 50 mL IVPB (0 g Intravenous Stopped 06/10/17 1905)    And  metroNIDAZOLE (FLAGYL) IVPB 500 mg (500 mg Intravenous New Bag/Given 06/10/17 1933)  DULoxetine (CYMBALTA) DR capsule 60 mg (not administered)  Melatonin TABS 10 mg (not administered)  metFORMIN (GLUCOPHAGE-XR) 24 hr tablet 1,000 mg (not administered)  acetaminophen (TYLENOL) tablet 1,000 mg (1,000 mg Oral Given 06/10/17 2045)  oxyCODONE (Oxy IR/ROXICODONE) immediate release tablet 5 mg (5 mg Oral Given 06/10/17 2045)    Vitals:   06/10/17 1344 06/10/17 1802 06/10/17 1936  BP: (!) 179/104 (!)  149/88 (!) 161/85  Pulse: 86 82 88  Resp: '18 16 18  ' Temp: 98.5 F (36.9 C) 98.5 F (36.9 C)   TempSrc: Oral  Oral   SpO2: 100% 99% 98%    Final diagnoses:  Wound of left lower extremity, initial encounter      Final Clinical Impressions(s) / ED Diagnoses   Final diagnoses:  Wound of left lower extremity, initial encounter    ED Discharge Orders    None       Deno Etienne, DO 06/10/17 2051    Deno Etienne, DO 06/10/17 2306

## 2017-06-10 NOTE — Progress Notes (Addendum)
Consult request has been received. CSW attempting to follow up at present time.  CSW will speak to the EDP and pt.  7:12 PM 12/20  CSW staffed with CSW Director who requested CSW see what supports in the community the pt has and whether there are any SNF's that will accept an LOG (Christmas Day is 7 days from today).  CSW called Santiago Glad (with a HIPPA-compliant request for info) at [employer] who stated pt's admission, per this writers description, would be too risky for [facility] and it's ability to get an authorization, especially with pt having [place] and it being the holidays.  CSW attempted to call Freda Munro at [facility] and is awaiting a call back at this time.  CSW spoke to Mathis at [facility] who stated she would review the pt's case once the CSW has permission to from the pt to send the pt's information/referral via the hub and that at [facility] are reviewed on a case-by-case basis and that [place] "rarely takes an  LOG".  CSW stated he would seek permission from the pt is a referral is appropriate and place an initial referral on the hub for [place] to review in the AM and [facility] was in agreement.  8:53 PM CSW met with EDP who stated pt is not appropriate for inpatient admission, per the hospitalist.  CSW met with pt who's only support is her elderly aunt.    CSW explored with the pt/family barriers to the pt returning home with assistance from the family with SNF placement through the pt's PCP.  Per the pt, she lies on the floor on top of a "pad" due to her being unable to ambulate to the bathroom and that when she can't "make it to the bathroom" she is unable to void herself without urination or defecation resulting in further infection of  pt's wound.  In addition, the pt states she avoids eating if possible to avoid this process and the wound has grown steadily worse, infection-wise.  In addition pt states she no longer has HHS and also woundcare through [facility] because  [facility] [employer] was to "recertify" with [employer] of [place] (insurance) by getting the necessary documentation from the pt's PCP but pt states "[facility] has been dragging their feet and is no longer caring for the pt (woundcare) due to [facility] no longer having insurance authorization from [place].  [place] received verbal permission from the pt to create an FL-2 and send referrals out via the hub on the pt's behalf.    In addition the pt agreed to D/C to the SNF that the pt is referred to and accepted by even if it means the SNF is outside of the [place] area ([place]) and that regardless of the location the pt agrees to D/C there if placement is needed outside of these areas.  Pt's aunt assured to pt she can drive to see the pt regardless of where the pt is D/C'd to.  CSW repeated this request for pt's agreement to D/C to a SNF placement found by theh CSW DEPT regardless of the location and the pt agreed.  CSW staffed with CSW Director who states CSW is to complete FL-2 and send referrals out to facilities via the hub and then leave a handoff or the 1st shift ED CSW at [facility] to seek admission for the pt ASAP to a SNF facility in the area and then if needed in areas outside of [place] by the end of 12/21  or the Medical Director is to be notified.  CSW Director is willing to provide a 7 day (and possibly more if necessary - see CSW Director) LOG to a SNF facility that is [place] to accept pt while BCBS authorizes pt for SNF.  This longer LOG offer (more than 5 days) is due to the holidays and need for BCBS to authorize with weekend/holiday days.  IF PLACEMENT IS NOT MADE BY THE END OF THE DAY ON 12/21 THE MEDICAL DIRECTOR MUST BE NOTIFIED by the 1st shift ED CSW at [facility], per the [place] Director.   Alphonse Guild. [place] Stell, LCSW, LCAS, CSI Clinical Social Worker Ph: [phone]

## 2017-06-10 NOTE — ED Triage Notes (Signed)
Pt sent from wound care for evaluation of worsening ambulatory/pt not able to ambulate; seen for left wound behind knee.

## 2017-06-10 NOTE — ED Notes (Signed)
Portable equipment called for bariatric bed

## 2017-06-10 NOTE — NC FL2 (Signed)
[place] LEVEL OF CARE SCREENING TOOL     IDENTIFICATION  Patient Name: [name] Birthdate: 1965-12-28 Sex: female Admission Date (Current Location): 06/10/2017  [facility] and [place] Number:  Herbalist and Address:  [facility], [place] [address] [place],  [address]      Provider Number: [id number]  Attending Physician Name and Address:  Deno Etienne, DO  Relative Name and Phone Number:       Current Level of Care: Hospital Recommended Level of Care: [place] Prior Approval Number:    Date Approved/Denied: 06/10/17 PASRR Number: [id number]  Discharge Plan: SNF    Current Diagnoses: Patient Active Problem List   Diagnosis Date Noted  . S/P hysterectomy 08/13/2011    Orientation RESPIRATION BLADDER Height & Weight     Self, Time, Situation, Place  Normal Continent Weight:   Height:     BEHAVIORAL SYMPTOMS/MOOD NEUROLOGICAL BOWEL NUTRITION STATUS      Continent Diet(Regular)  AMBULATORY STATUS COMMUNICATION OF NEEDS Skin   Extensive Assist Verbally (Pt has a deep wound to the left posterior leg with irritated and raw skin around the wound likely due to prior bandaging.                       Personal Care Assistance Level of Assistance  Bathing, Dressing Bathing Assistance: Maximum assistance   Dressing Assistance: Maximum assistance     Functional Limitations Info             SPECIAL CARE FACTORS FREQUENCY  PT (By licensed PT), OT (By licensed OT)     PT Frequency: 5 OT Frequency: 5            Contractures Contractures Info: Not present    Additional Factors Info  Code Status, Allergies Code Status Info: Prior Allergies Info: No Known Allergies           Current Medications (06/10/2017):  This is the current hospital active medication list Current Facility-Administered Medications  Medication Dose Route Frequency Provider Last Rate Last Dose  .  cefTRIAXone (ROCEPHIN) 2 g in dextrose 5 % 50 mL IVPB  2 g Intravenous Q24H Deno Etienne, DO   Stopped at 06/10/17 1905   And  . metroNIDAZOLE (FLAGYL) IVPB 500 mg  500 mg Intravenous Q8H Deno Etienne, DO 100 mL/hr at 06/10/17 1933 500 mg at 06/10/17 1933  . doxycycline (VIBRA-TABS) tablet 100 mg  100 mg Oral Q12H Deno Etienne, DO      . DULoxetine (CYMBALTA) DR capsule 60 mg  60 mg Oral Daily Deno Etienne, DO      . [START ON 06/11/2017] metFORMIN (GLUCOPHAGE-XR) 24 hr tablet 1,000 mg  1,000 mg Oral BID WC Deno Etienne, DO       Current Outpatient Medications  Medication Sig Dispense Refill  . acetaminophen (TYLENOL) 650 MG CR tablet Take 2,600 mg by mouth 2 (two) times daily.    . DULoxetine (CYMBALTA) 60 MG capsule Take 60 mg by mouth daily.    . Melatonin 10 MG TABS Take 10 mg by mouth at bedtime.    . metFORMIN (GLUCOPHAGE-XR) 500 MG 24 hr tablet Take 1,000 mg by mouth 2 (two) times daily with a meal.  0  . naproxen (NAPROSYN) 500 MG tablet Take 500 mg by mouth 2 (two) times daily.  2  . naproxen sodium (ALEVE) 220 MG tablet Take 660 mg by mouth 2 (two) times daily.    Marland Kitchen  OVER THE COUNTER MEDICATION Apply 1 application topically daily as needed (DRY SKIN). DIABETIC CREAM     . HYDROcodone-acetaminophen (VICODIN) 5-500 MG per tablet Take 1 tablet by mouth every 4 (four) hours as needed. (Patient not taking: Reported on 06/10/2017) 30 tablet 0     Discharge Medications: Please see discharge summary for a list of discharge medications.  Relevant Imaging Results:  Relevant Lab Results:   Additional Information [id number]  Alphonse Guild Trysta Showman, LCSWA

## 2017-06-10 NOTE — ED Notes (Signed)
Pt placed on bariatric bed

## 2017-06-10 NOTE — ED Notes (Signed)
Attempted blood draw in triage but was unsuccessful. 

## 2017-06-10 NOTE — Progress Notes (Signed)
CSW applied for and received PASSR from [employer].    PASSR: [id number]  CSW will continue to follow for D/C needs.  Alphonse Guild. Camren Lipsett, LCSW, LCAS, CSI Clinical Social Worker Ph: [phone]

## 2017-06-10 NOTE — ED Notes (Signed)
Pt in X-Ray ?

## 2017-06-11 DIAGNOSIS — S8990XA Unspecified injury of unspecified lower leg, initial encounter: Secondary | ICD-10-CM | POA: Diagnosis not present

## 2017-06-11 DIAGNOSIS — L8992 Pressure ulcer of unspecified site, stage 2: Secondary | ICD-10-CM | POA: Diagnosis not present

## 2017-06-11 LAB — HEMOGLOBIN A1C
HEMOGLOBIN A1C: 6 % — AB (ref 4.8–5.6)
Mean Plasma Glucose: 125.5 mg/dL

## 2017-06-11 MED ORDER — HYDROCODONE-ACETAMINOPHEN 5-325 MG PO TABS: 1.0000 | ORAL_TABLET | Freq: Four times a day (QID) | ORAL | Status: DC | PRN

## 2017-06-11 MED ORDER — CEPHALEXIN 500 MG PO CAPS: 500.0000 mg | ORAL_CAPSULE | Freq: Four times a day (QID) | ORAL | 0 refills | Status: DC

## 2017-06-11 MED ORDER — COLLAGENASE 250 UNIT/GM EX OINT
TOPICAL_OINTMENT | Freq: Every day | CUTANEOUS | Status: DC
  Administered 2017-06-11: 11:00:00 via TOPICAL
  Filled 2017-06-11: qty 90

## 2017-06-11 MED ORDER — HYDROCODONE-ACETAMINOPHEN 5-325 MG PO TABS
1.0000 | ORAL_TABLET | Freq: Four times a day (QID) | ORAL | 0 refills | Status: DC | PRN
Start: 2017-06-11 — End: 2020-10-10

## 2017-06-11 NOTE — Progress Notes (Signed)
Physical Therapy Treatment Patient Details Name: [name] MRN: [id number] DOB: 12-24-1965 Today's Date: 06/11/2017    History of Present Illness 51 yo F with a chief complaint of a wound to the left posterior leg.   She was seen at the wound care center 12/20 with  were concern that it was much worse than 3 weeks ago and she is now unable to take care of herself so they sent her to the hospital.      PT Comments    Assisted patient with rolling for bedpan with CNA assisting. Continue mobility efforts with PT.    Follow Up Recommendations  SNF     Equipment Recommendations  Wheelchair (measurements PT);Wheelchair cushion (measurements PT)    Recommendations for Other Services       Precautions / Restrictions Precautions Precautions: Fall Precaution Comments: wound on left leg    Mobility  Bed Mobility Overal bed mobility: Needs Assistance Bed Mobility: Rolling;Supine to Sit Rolling: Total assist;+2 for physical assistance;+2 for safety/equipment   Supine to sit: +2 for physical assistance;Total assist;+2 for safety/equipment     General bed mobility comments: patient requesting Bedpan. Assisted to roll in both directions with2 assist and use of bed rails. Assist with each leg to cross over to facilitate rolling.The patient puts forth the effort to self assist.  Transfers                    Ambulation/Gait                 Stairs            Wheelchair Mobility    Modified Rankin (Stroke Patients Only)       Balance Overall balance assessment: Needs assistance Sitting-balance support: Bilateral upper extremity supported Sitting balance-Leahy Scale: Poor Sitting balance - Comments: relies on upper body for strength to sit upright in bed. Unable to sit at bed edge.                                     Cognition Arousal/Alertness: Awake/alert Behavior During Therapy: WFL for tasks assessed/performed Overall  Cognitive Status: Within Functional Limits for tasks assessed                                        Exercises      General Comments        Pertinent Vitals/Pain Pain Assessment: 0-10 Pain Score: 7  Pain Location: left leg and back Pain Descriptors / Indicators: Cramping;Discomfort;Grimacing;Guarding Pain Intervention(s): Monitored during session    Home Living Family/patient expects to be discharged to:: Private residence Living Arrangements: Other relatives   Type of Home: Apartment Home Access: Level entry   Home Layout: One level Home Equipment: Environmental consultant - 4 wheels Additional Comments: has been unable to ambulate x 1 month, required 6 assist from [facility] to bed in ED    Prior Function Level of Independence: Needs assistance  Gait / Transfers Assistance Needed: has been using pads on floor for B/B       PT Goals (current goals can now be found in the care plan section) Acute Rehab PT Goals Patient Stated Goal: to get back on my feet and walk, lose weight. PT Goal Formulation: With patient/family Time For Goal Achievement: 06/11/17 Potential to Achieve Goals: Good Progress  towards PT goals: Progressing toward goals    Frequency    Min 2X/week      PT Plan Current plan remains appropriate    Co-evaluation              AM-PAC PT "6 Clicks" Daily Activity  Outcome Measure  Difficulty turning over in bed (including adjusting bedclothes, sheets and blankets)?: Unable Difficulty moving from lying on back to sitting on the side of the bed? : Unable Difficulty sitting down on and standing up from a chair with arms (e.g., wheelchair, bedside commode, etc,.)?: Unable Help needed moving to and from a bed to chair (including a wheelchair)?: Total Help needed walking in hospital room?: Total Help needed climbing 3-5 steps with a railing? : Total 6 Click Score: 6    End of Session   Activity Tolerance: Patient tolerated treatment well Patient  left: in bed;with call bell/phone within reach;with family/visitor present Nurse Communication: Mobility status PT Visit Diagnosis: Difficulty in walking, not elsewhere classified (R26.2)     Time: 6440-3474 PT Time Calculation (min) (ACUTE ONLY): 16 min  Charges:  $Self Care/Home Management: 8-22                    G Codes:  Functional Assessment Tool Used: AM-PAC 6 Clicks Basic Mobility Functional Limitation: Mobility: Walking and moving around Mobility: Walking and Moving Around Current Status (Q5956): 100 percent impaired, limited or restricted Mobility: Walking and Moving Around Goal Status (L8756): At least 20 percent but less than 40 percent impaired, limited or restricted    [facility] PT [phone]  [name] 06/11/2017, 9:37 AM

## 2017-06-11 NOTE — ED Notes (Signed)
Patient wound cleansed and dried with guaze. Santyl applied per order. Saline moistened guaze applied. ABD pad applied. Bandage secured with kerlex wrap. Patient educated on dressing changes at home and provided with supplies for 3 days of dressing changes. Patient and patient aunt verbalize understanding.

## 2017-06-11 NOTE — Progress Notes (Addendum)
CSW spoke with Rikki Spearing to have doctor place order for pt's pump to be used at [facility] as [facility]a Setters from the facility reports that this is the only way that the facility would be able to use pt's pump from home. In doing this CSW spoke with pt via phone and was informed that pt would like to go home because pt feels that [facility] may not be able to meet needs (due to the pump). Pt also chose to go home due to not being able to afford the out of pocket deductible that [facility] is asking for. CSW has update RN and MD at this time. CSW asked that MD set pt up with [facility] so that pt can still receive some services once home. At this time CSW has updated Sheri from [facility] of pt's decision.CSW has updated both Market researcher (Dr. Loni Muse) as well as [place] Director of pt's choice. RN to call [facility] for transport.  CSW signing off.      Virgie Dad Harvest Stanco, MSW, [place] [facility] Clinical Social Worker [phone]

## 2017-06-11 NOTE — Discharge Instructions (Signed)
Make sure to take your antibiotic for urinary tract infection.  Home health will be contacting for some help at home.  Follow up with wound care and your regular doctor

## 2017-06-11 NOTE — Discharge Planning (Signed)
Stevie Charter J. Clydene Laming, RN, BSN, [employer] [phone] Spoke with pt at bedside regarding discharge planning for [facility]. Offered pt list of home health agencies to choose from.  Pt chose [facility] to render services. Lovett Sox of [facility] notified. Patient made aware that [facility] will be in contact in 24-48 hours.  No DME needs identified at this time.

## 2017-06-11 NOTE — ED Notes (Signed)
Recliner brought in room for patients visitor

## 2017-06-11 NOTE — Progress Notes (Addendum)
11:35: CSW has routed over information to [employer] for review at this time. CSW spoke with pt via phone and pt sought information on what other facilities would take pt at this time. CSW explained to pt that she has been reaching out to facilities and have received verbal declines due to facility not being able to meet pt's need. CSW explained that CSW has been in contact with Sheri from [facility] and that they can potentially take pt today. Pt mentioned that she has spoken with [facility] and that [facility] informed her that pt's aunt can not stay at the facility with pt, and that is what pt is wanting at this time. CSW explained to pt that this is very rare at most facilities that pt's are allowed to have family members stay with them. CSW has explained to pt that at this time this is the only option that CSW has available for pt. CSW spoke with [facility] and was informed that If pt is not wanting this placement pt is to be discharge home with [facility] services if agreeable to receive them. CSW will check back in with pt at 12pm today for a decision.   10:53am: CSW received call from [place] at [facility]. Sheri asked that CSW speak with pt about [employer] benefits and make pt aware that pt would receive a bill from the facility at some point in time. CSW was made aware that pt wouldn't have to pay this up front but pt would get a bill at the end of stay at facility. CSW asked that [facility] call and speak with pt about this and then reach back out to CSW.   10:34am: CSW spoke with admissions at [facility] and was informed that they do not take LOG's so they would be unable to meet pt's need. CSW received call from [place] at [facility] and was informed that they are not in contract with [employer] so they are unable to take pt as well.    10:16am: CSW spoke with [place] at [facility] and was informed that she is looking over pt's chart and would call CSW back. CSW spoke with Barbera Setters from [facility]  and was informed that they are making sure that they have a bed for pt at this time.   10:03am: CSW received call from [place] at [facility] and was informed that they are reviewing pt at this time and will call CSW back with an answer.   9:39:am:  CSW spoke with Con Memos from [facility] and was informed that she would have Sheri review the pt information and call CSW back shortly with a decision. CSW awaits call at this tim.e   9:22am: [facility]. CSW reached out to [employer] and left a message for [employer] to call CSW back regarding pt. CSW reached out to [employer] and spoke with [employer] and was informed that she would have to speak with DON and call CSW back about. CSW also reached out to [place] at [facility] and left VM asking that she call CSW back as well. CSW contacted [employer] and [facility] admission and left VM for call to be return.   8:48am: CSW has reached out [place] at [facility] to see if they are able to meet pt needs. CSW is awaiting returned call from [place] at this time.   8:07am: CSW spoke with [place] from [facility] after making an  offer on pt. CSW was informed by Juliann Pulse that [facility] CAN NOT take pt even after making the offer. CSW will continue to follow for other SNF options.   7:35am: CSW made aware that pt is in need of placement. CSW has reached out to a representative from [facility] to see if they are able to meet pt's need. CSW made aware that facility will need PT evalution as well as Wound Care notes as well. CSW is awaiting response at this time and will continue to follow for pt needs.    [name], MSW, [place] [facility] Clinical Social Worker [phone]

## 2017-06-11 NOTE — Evaluation (Signed)
Physical Therapy Evaluation Patient Details Name: [name] MRN: [id number] DOB: [birth year] Today's Date: 06/11/2017   History of Present Illness  51 yo F with a chief complaint of a wound to the left posterior leg.   She was seen at the wound care center 12/20 with  were concern that it was much worse than 3 weeks ago and she is now unable to take care of herself so they sent her to the hospital.    Clinical Impression  The patient is very pleasant and motivated to mobilize. She does report inability to ambulate x ~ 1 month. Has barely been able to stand from RW. She reports injuries to both knees in regards to cartilage and ligaments which she reports causes difficulty to ambulate. The patient uses lymphadema pumps at home to LE's. Currently has left leg wound. Pt admitted with above diagnosis. Pt currently with functional limitations due to the deficits listed below (see PT Problem List).  Pt will benefit from skilled PT to increase their independence and safety with mobility to allow discharge to the venue listed below.       Follow Up Recommendations SNF    Equipment Recommendations  Wheelchair (measurements PT);Wheelchair cushion (measurements PT)    Recommendations for Other Services       Precautions / Restrictions Precautions Precautions: Fall Precaution Comments: wound on left leg      Mobility  Bed Mobility Overal bed mobility: Needs Assistance Bed Mobility: Rolling;Supine to Sit Rolling: Total assist;+2 for physical assistance;+2 for safety/equipment   Supine to sit: +2 for physical assistance;Total assist;+2 for safety/equipment     General bed mobility comments: patient requires assist to  roll parially until reaches rail, then can self assist upper body, needs assist with the legs. Patient rolled in both directions for pericare and WOCRN to inspect the back side. The patient is able to reach Headboard and assist sliding up in the bed.  the patient is able  to use both lower bed rails and pull self into long sitting upright in bed for a few seconds at a time. Encouraged patient to perform this multiple times a day.  Transfers                    Ambulation/Gait                Stairs            Wheelchair Mobility    Modified Rankin (Stroke Patients Only)       Balance Overall balance assessment: Needs assistance Sitting-balance support: Bilateral upper extremity supported Sitting balance-Leahy Scale: Poor Sitting balance - Comments: relies on upper body for strength to sit upright in bed. Unable to sit at bed edge.                                     Pertinent Vitals/Pain Pain Assessment: 0-10 Pain Score: 7  Pain Location: left leg and back Pain Descriptors / Indicators: Cramping;Discomfort;Grimacing;Guarding Pain Intervention(s): Repositioned;Monitored during session;Limited activity within patient's tolerance    Home Living Family/patient expects to be discharged to:: Private residence Living Arrangements: Other relatives   Type of Home: Apartment Home Access: Level entry     Home Layout: One level Home Equipment: Walker - 4 wheels Additional Comments: has been unable to ambulate x 1 month, required 6 assist from [facility] to bed in ED    Prior Function  Level of Independence: Needs assistance   Gait / Transfers Assistance Needed: has been using pads on floor for B/B           Hand Dominance        Extremity/Trunk Assessment   Upper Extremity Assessment Upper Extremity Assessment: Overall WFL for tasks assessed    Lower Extremity Assessment Lower Extremity Assessment: RLE deficits/detail;LLE deficits/detail RLE Deficits / Details: marked edema and  skin changes. able to lift leg from bed ~ 3 ". reports  torn cartilage. LLE Deficits / Details: marked  edema and skin changes. unable to pick the leg up.       Communication      Cognition Arousal/Alertness:  Awake/alert Behavior During Therapy: WFL for tasks assessed/performed Overall Cognitive Status: Within Functional Limits for tasks assessed                                        General Comments      Exercises     Assessment/Plan    PT Assessment Patient needs continued PT services  PT Problem List Decreased strength;Decreased range of motion;Decreased knowledge of use of DME;Obesity;Decreased activity tolerance;Decreased skin integrity;Decreased safety awareness;Decreased knowledge of precautions;Decreased mobility       PT Treatment Interventions DME instruction;Gait training;Functional mobility training;Therapeutic activities;Therapeutic exercise;Patient/family education    PT Goals (Current goals can be found in the Care Plan section)  Acute Rehab PT Goals Patient Stated Goal: to get back on my feet and walk, lose weight. PT Goal Formulation: With patient/family Time For Goal Achievement: 06/11/17 Potential to Achieve Goals: Good    Frequency Min 2X/week   Barriers to discharge Decreased caregiver support      Co-evaluation               AM-PAC PT "6 Clicks" Daily Activity  Outcome Measure Difficulty turning over in bed (including adjusting bedclothes, sheets and blankets)?: Unable Difficulty moving from lying on back to sitting on the side of the bed? : Unable Difficulty sitting down on and standing up from a chair with arms (e.g., wheelchair, bedside commode, etc,.)?: Unable Help needed moving to and from a bed to chair (including a wheelchair)?: Total Help needed walking in hospital room?: Total Help needed climbing 3-5 steps with a railing? : Total 6 Click Score: 6    End of Session   Activity Tolerance: Patient tolerated treatment well Patient left: in bed;with call bell/phone within reach;with family/visitor present Nurse Communication: Mobility status PT Visit Diagnosis: Difficulty in walking, not elsewhere classified (R26.2)     Time: 9323-5573 PT Time Calculation (min) (ACUTE ONLY): 21 min   Charges:   PT Evaluation $PT Eval High Complexity: 1 High     PT G Codes:   PT G-Codes **NOT FOR INPATIENT CLASS** Functional Assessment Tool Used: AM-PAC 6 Clicks Basic Mobility Functional Limitation: Mobility: Walking and moving around Mobility: Walking and Moving Around Current Status (U2025): 100 percent impaired, limited or restricted Mobility: Walking and Moving Around Goal Status (K2706): At least 20 percent but less than 40 percent impaired, limited or restricted    [facility] PT [phone]   [name] 06/11/2017, 9:30 AM

## 2017-06-11 NOTE — ED Notes (Signed)
[facility] called for patient transport 

## 2017-06-11 NOTE — Discharge Planning (Signed)
Clinical Social Work is seeking post-discharge placement for this patient at the following level of care: Skilled Nursing Facility.    

## 2017-06-11 NOTE — ED Provider Notes (Signed)
Patient initially was waiting for placement in a facility.  There is to put facilities that were willing to take the patient however they did not have access to a lymphedema pump which the patient has at home but they were unwilling to let her bring her own device.  Also they had extremely high deductible and patient and family have chosen for her to go home.  Will put an order in for evaluation for a face-to-face with home health services which patient is appreciative of.  Based on labs patient also appears to have a urinary tract infection.  She was given Rocephin but will discharge with a prescription for Keflex.  Also encouraged her to continue with the wound care center.   Blanchie Dessert, MD 06/11/17 1230

## 2017-06-11 NOTE — Consult Note (Addendum)
[facility] Nurse wound consult note Reason for Consult: venous stasis wound LLE, partial thickness wounds bilateral posterior thighs and buttocks from incontinence Wound type: venous insufficiency, IAD Pressure Injury POA:NA Measurement: LLE behind knee in skin fold 5cm x 7cm x 0.1cm with 70% pink wound bed, 30% tan loosely adhering slough, moderate tan exudate, malodorous. Bilateral posterior thighs and buttocks have more than a dozen partial thickness wounds from IAD, each measuring around 1cm, all with pink wound beds. Wound bed:see above Drainage (amount, consistency, odor) see above Periwound:intact Dressing procedure/placement/frequency: I have provided nurses with orders for LLE wound, Santyl enzymatic debriding cream dressing changes daily.  Ordered perineal cleanser, Purple top Barrier Cream to IAD areas on buttocks and posterior thighs. Patient has intertrigenous moisture, InterDry ordered for under panus and skin folds in legs.  Patient has not been using her lymphedema pumps because they cause her to urinate more often. Educated on the need for using some type of device to help with the edema while she is in a facility and will have more assistance. Explained if can get a decreased amount of edema she could ambulate better. Patient currently with PureWick urinary device which would be helpful if that could be continued at transfer to another facility. Unsure if other facility would have supplies. If not, the use of a female urinal may be sufficient. Patient is not incontinent, she urinates on herself because she can not get up. Patient unaware that she has a UTI. Patient needs to either use her lymphedema pumps, SCDs or ACE wraps to facilitate reducing edema in BLE. Please order upon discharge to facility today if you agree.  Surprisingly her albumin and protein levels are not low. Patient has been educated on above information. We will not follow, but will remain available to this patient, to nursing,  and the medical and/or surgical teams.  Please re-consult if we need to assist further prior to discharging.   Fara Olden, RN-C, WTA-C, [place] Wound Treatment Associate Ostomy Care Associate

## 2017-06-12 LAB — URINE CULTURE: CULTURE: NO GROWTH

## 2017-06-15 LAB — CULTURE, BLOOD (ROUTINE X 2)
Culture: NO GROWTH
Special Requests: ADEQUATE

## 2017-07-01 ENCOUNTER — Encounter (HOSPITAL_BASED_OUTPATIENT_CLINIC_OR_DEPARTMENT_OTHER): Payer: BLUE CROSS/BLUE SHIELD | Attending: Internal Medicine

## 2017-07-01 DIAGNOSIS — L97822 Non-pressure chronic ulcer of other part of left lower leg with fat layer exposed: Secondary | ICD-10-CM | POA: Diagnosis not present

## 2017-07-01 DIAGNOSIS — I87322 Chronic venous hypertension (idiopathic) with inflammation of left lower extremity: Secondary | ICD-10-CM | POA: Diagnosis not present

## 2017-07-01 DIAGNOSIS — Z7984 Long term (current) use of oral hypoglycemic drugs: Secondary | ICD-10-CM | POA: Diagnosis not present

## 2017-07-01 DIAGNOSIS — E11622 Type 2 diabetes mellitus with other skin ulcer: Secondary | ICD-10-CM | POA: Diagnosis not present

## 2017-07-01 DIAGNOSIS — I890 Lymphedema, not elsewhere classified: Secondary | ICD-10-CM | POA: Insufficient documentation

## 2017-07-15 DIAGNOSIS — L97822 Non-pressure chronic ulcer of other part of left lower leg with fat layer exposed: Secondary | ICD-10-CM | POA: Diagnosis not present

## 2017-07-15 DIAGNOSIS — I890 Lymphedema, not elsewhere classified: Secondary | ICD-10-CM | POA: Diagnosis not present

## 2017-07-15 DIAGNOSIS — I87322 Chronic venous hypertension (idiopathic) with inflammation of left lower extremity: Secondary | ICD-10-CM | POA: Diagnosis not present

## 2017-07-15 DIAGNOSIS — Z7984 Long term (current) use of oral hypoglycemic drugs: Secondary | ICD-10-CM | POA: Diagnosis not present

## 2017-07-15 DIAGNOSIS — E11622 Type 2 diabetes mellitus with other skin ulcer: Secondary | ICD-10-CM | POA: Diagnosis not present

## 2017-07-29 ENCOUNTER — Encounter (HOSPITAL_BASED_OUTPATIENT_CLINIC_OR_DEPARTMENT_OTHER): Payer: BLUE CROSS/BLUE SHIELD | Attending: Internal Medicine

## 2017-07-29 DIAGNOSIS — L97822 Non-pressure chronic ulcer of other part of left lower leg with fat layer exposed: Secondary | ICD-10-CM | POA: Insufficient documentation

## 2017-07-29 DIAGNOSIS — E6609 Other obesity due to excess calories: Secondary | ICD-10-CM | POA: Diagnosis not present

## 2017-07-29 DIAGNOSIS — B9561 Methicillin susceptible Staphylococcus aureus infection as the cause of diseases classified elsewhere: Secondary | ICD-10-CM | POA: Diagnosis not present

## 2017-07-29 DIAGNOSIS — E11622 Type 2 diabetes mellitus with other skin ulcer: Secondary | ICD-10-CM | POA: Diagnosis not present

## 2017-07-29 DIAGNOSIS — I87322 Chronic venous hypertension (idiopathic) with inflammation of left lower extremity: Secondary | ICD-10-CM | POA: Diagnosis not present

## 2017-07-29 DIAGNOSIS — M1990 Unspecified osteoarthritis, unspecified site: Secondary | ICD-10-CM | POA: Diagnosis not present

## 2017-07-29 DIAGNOSIS — I890 Lymphedema, not elsewhere classified: Secondary | ICD-10-CM | POA: Insufficient documentation

## 2017-07-29 DIAGNOSIS — Z6841 Body Mass Index (BMI) 40.0 and over, adult: Secondary | ICD-10-CM | POA: Insufficient documentation

## 2017-07-29 DIAGNOSIS — L03116 Cellulitis of left lower limb: Secondary | ICD-10-CM | POA: Diagnosis not present

## 2017-08-04 DIAGNOSIS — M25562 Pain in left knee: Secondary | ICD-10-CM | POA: Diagnosis not present

## 2017-08-04 DIAGNOSIS — M1990 Unspecified osteoarthritis, unspecified site: Secondary | ICD-10-CM | POA: Diagnosis not present

## 2017-08-04 DIAGNOSIS — M25561 Pain in right knee: Secondary | ICD-10-CM | POA: Diagnosis not present

## 2017-08-04 DIAGNOSIS — F329 Major depressive disorder, single episode, unspecified: Secondary | ICD-10-CM | POA: Diagnosis not present

## 2017-08-04 DIAGNOSIS — Z6841 Body Mass Index (BMI) 40.0 and over, adult: Secondary | ICD-10-CM | POA: Diagnosis not present

## 2017-08-04 DIAGNOSIS — E119 Type 2 diabetes mellitus without complications: Secondary | ICD-10-CM | POA: Diagnosis not present

## 2017-08-04 DIAGNOSIS — G8929 Other chronic pain: Secondary | ICD-10-CM | POA: Diagnosis not present

## 2017-08-20 DIAGNOSIS — Z6841 Body Mass Index (BMI) 40.0 and over, adult: Secondary | ICD-10-CM | POA: Diagnosis not present

## 2017-08-20 DIAGNOSIS — M25562 Pain in left knee: Secondary | ICD-10-CM | POA: Diagnosis not present

## 2017-08-20 DIAGNOSIS — G8929 Other chronic pain: Secondary | ICD-10-CM | POA: Diagnosis not present

## 2017-08-20 DIAGNOSIS — M1990 Unspecified osteoarthritis, unspecified site: Secondary | ICD-10-CM | POA: Diagnosis not present

## 2017-08-20 DIAGNOSIS — E119 Type 2 diabetes mellitus without complications: Secondary | ICD-10-CM | POA: Diagnosis not present

## 2017-08-20 DIAGNOSIS — F329 Major depressive disorder, single episode, unspecified: Secondary | ICD-10-CM | POA: Diagnosis not present

## 2017-08-20 DIAGNOSIS — M25561 Pain in right knee: Secondary | ICD-10-CM | POA: Diagnosis not present

## 2017-08-26 ENCOUNTER — Encounter (HOSPITAL_BASED_OUTPATIENT_CLINIC_OR_DEPARTMENT_OTHER): Payer: BLUE CROSS/BLUE SHIELD | Attending: Internal Medicine

## 2017-08-26 DIAGNOSIS — I890 Lymphedema, not elsewhere classified: Secondary | ICD-10-CM | POA: Insufficient documentation

## 2017-08-26 DIAGNOSIS — L97822 Non-pressure chronic ulcer of other part of left lower leg with fat layer exposed: Secondary | ICD-10-CM | POA: Insufficient documentation

## 2017-08-26 DIAGNOSIS — E11622 Type 2 diabetes mellitus with other skin ulcer: Secondary | ICD-10-CM | POA: Insufficient documentation

## 2017-09-01 DIAGNOSIS — M25562 Pain in left knee: Secondary | ICD-10-CM | POA: Diagnosis not present

## 2017-09-01 DIAGNOSIS — M25561 Pain in right knee: Secondary | ICD-10-CM | POA: Diagnosis not present

## 2017-09-01 DIAGNOSIS — G8929 Other chronic pain: Secondary | ICD-10-CM | POA: Diagnosis not present

## 2017-09-02 DIAGNOSIS — L97822 Non-pressure chronic ulcer of other part of left lower leg with fat layer exposed: Secondary | ICD-10-CM | POA: Diagnosis not present

## 2017-09-02 DIAGNOSIS — I890 Lymphedema, not elsewhere classified: Secondary | ICD-10-CM | POA: Diagnosis not present

## 2017-09-02 DIAGNOSIS — E11622 Type 2 diabetes mellitus with other skin ulcer: Secondary | ICD-10-CM | POA: Diagnosis not present

## 2017-09-08 DIAGNOSIS — Z6841 Body Mass Index (BMI) 40.0 and over, adult: Secondary | ICD-10-CM | POA: Diagnosis not present

## 2017-09-08 DIAGNOSIS — M1990 Unspecified osteoarthritis, unspecified site: Secondary | ICD-10-CM | POA: Diagnosis not present

## 2017-09-08 DIAGNOSIS — F329 Major depressive disorder, single episode, unspecified: Secondary | ICD-10-CM | POA: Diagnosis not present

## 2017-09-08 DIAGNOSIS — G8929 Other chronic pain: Secondary | ICD-10-CM | POA: Diagnosis not present

## 2017-09-08 DIAGNOSIS — E119 Type 2 diabetes mellitus without complications: Secondary | ICD-10-CM | POA: Diagnosis not present

## 2017-09-08 DIAGNOSIS — M25562 Pain in left knee: Secondary | ICD-10-CM | POA: Diagnosis not present

## 2017-09-08 DIAGNOSIS — M25561 Pain in right knee: Secondary | ICD-10-CM | POA: Diagnosis not present

## 2017-09-16 DIAGNOSIS — M1712 Unilateral primary osteoarthritis, left knee: Secondary | ICD-10-CM | POA: Diagnosis not present

## 2017-09-16 DIAGNOSIS — L97822 Non-pressure chronic ulcer of other part of left lower leg with fat layer exposed: Secondary | ICD-10-CM | POA: Diagnosis not present

## 2017-09-16 DIAGNOSIS — M1711 Unilateral primary osteoarthritis, right knee: Secondary | ICD-10-CM | POA: Diagnosis not present

## 2017-09-16 DIAGNOSIS — E11622 Type 2 diabetes mellitus with other skin ulcer: Secondary | ICD-10-CM | POA: Diagnosis not present

## 2017-09-16 DIAGNOSIS — I890 Lymphedema, not elsewhere classified: Secondary | ICD-10-CM | POA: Diagnosis not present

## 2017-09-23 DIAGNOSIS — E119 Type 2 diabetes mellitus without complications: Secondary | ICD-10-CM | POA: Diagnosis not present

## 2017-09-23 DIAGNOSIS — M1990 Unspecified osteoarthritis, unspecified site: Secondary | ICD-10-CM | POA: Diagnosis not present

## 2017-09-23 DIAGNOSIS — M25561 Pain in right knee: Secondary | ICD-10-CM | POA: Diagnosis not present

## 2017-09-23 DIAGNOSIS — M25562 Pain in left knee: Secondary | ICD-10-CM | POA: Diagnosis not present

## 2017-09-23 DIAGNOSIS — F329 Major depressive disorder, single episode, unspecified: Secondary | ICD-10-CM | POA: Diagnosis not present

## 2017-09-23 DIAGNOSIS — G8929 Other chronic pain: Secondary | ICD-10-CM | POA: Diagnosis not present

## 2017-09-23 DIAGNOSIS — Z6841 Body Mass Index (BMI) 40.0 and over, adult: Secondary | ICD-10-CM | POA: Diagnosis not present

## 2017-09-24 DIAGNOSIS — L97821 Non-pressure chronic ulcer of other part of left lower leg limited to breakdown of skin: Secondary | ICD-10-CM | POA: Diagnosis not present

## 2017-09-28 DIAGNOSIS — E119 Type 2 diabetes mellitus without complications: Secondary | ICD-10-CM | POA: Diagnosis not present

## 2017-09-28 DIAGNOSIS — M25561 Pain in right knee: Secondary | ICD-10-CM | POA: Diagnosis not present

## 2017-09-28 DIAGNOSIS — M1990 Unspecified osteoarthritis, unspecified site: Secondary | ICD-10-CM | POA: Diagnosis not present

## 2017-09-28 DIAGNOSIS — Z6841 Body Mass Index (BMI) 40.0 and over, adult: Secondary | ICD-10-CM | POA: Diagnosis not present

## 2017-09-28 DIAGNOSIS — M25562 Pain in left knee: Secondary | ICD-10-CM | POA: Diagnosis not present

## 2017-09-28 DIAGNOSIS — G8929 Other chronic pain: Secondary | ICD-10-CM | POA: Diagnosis not present

## 2017-09-28 DIAGNOSIS — F329 Major depressive disorder, single episode, unspecified: Secondary | ICD-10-CM | POA: Diagnosis not present

## 2017-09-30 ENCOUNTER — Encounter (HOSPITAL_BASED_OUTPATIENT_CLINIC_OR_DEPARTMENT_OTHER): Payer: BLUE CROSS/BLUE SHIELD | Attending: Internal Medicine

## 2017-09-30 DIAGNOSIS — E6609 Other obesity due to excess calories: Secondary | ICD-10-CM | POA: Insufficient documentation

## 2017-09-30 DIAGNOSIS — Z6841 Body Mass Index (BMI) 40.0 and over, adult: Secondary | ICD-10-CM | POA: Insufficient documentation

## 2017-09-30 DIAGNOSIS — L97222 Non-pressure chronic ulcer of left calf with fat layer exposed: Secondary | ICD-10-CM | POA: Diagnosis not present

## 2017-09-30 DIAGNOSIS — I87322 Chronic venous hypertension (idiopathic) with inflammation of left lower extremity: Secondary | ICD-10-CM | POA: Diagnosis not present

## 2017-09-30 DIAGNOSIS — L97822 Non-pressure chronic ulcer of other part of left lower leg with fat layer exposed: Secondary | ICD-10-CM | POA: Diagnosis not present

## 2017-09-30 DIAGNOSIS — I890 Lymphedema, not elsewhere classified: Secondary | ICD-10-CM | POA: Diagnosis not present

## 2017-09-30 DIAGNOSIS — E11622 Type 2 diabetes mellitus with other skin ulcer: Secondary | ICD-10-CM | POA: Insufficient documentation

## 2017-10-06 DIAGNOSIS — M170 Bilateral primary osteoarthritis of knee: Secondary | ICD-10-CM | POA: Diagnosis not present

## 2017-10-06 DIAGNOSIS — L97222 Non-pressure chronic ulcer of left calf with fat layer exposed: Secondary | ICD-10-CM | POA: Diagnosis not present

## 2017-10-06 DIAGNOSIS — I87322 Chronic venous hypertension (idiopathic) with inflammation of left lower extremity: Secondary | ICD-10-CM | POA: Diagnosis not present

## 2017-10-06 DIAGNOSIS — I890 Lymphedema, not elsewhere classified: Secondary | ICD-10-CM | POA: Diagnosis not present

## 2017-10-06 DIAGNOSIS — Z6841 Body Mass Index (BMI) 40.0 and over, adult: Secondary | ICD-10-CM | POA: Diagnosis not present

## 2017-10-06 DIAGNOSIS — E119 Type 2 diabetes mellitus without complications: Secondary | ICD-10-CM | POA: Diagnosis not present

## 2017-10-11 DIAGNOSIS — L97222 Non-pressure chronic ulcer of left calf with fat layer exposed: Secondary | ICD-10-CM | POA: Diagnosis not present

## 2017-10-11 DIAGNOSIS — E119 Type 2 diabetes mellitus without complications: Secondary | ICD-10-CM | POA: Diagnosis not present

## 2017-10-11 DIAGNOSIS — I890 Lymphedema, not elsewhere classified: Secondary | ICD-10-CM | POA: Diagnosis not present

## 2017-10-11 DIAGNOSIS — I87322 Chronic venous hypertension (idiopathic) with inflammation of left lower extremity: Secondary | ICD-10-CM | POA: Diagnosis not present

## 2017-10-11 DIAGNOSIS — Z6841 Body Mass Index (BMI) 40.0 and over, adult: Secondary | ICD-10-CM | POA: Diagnosis not present

## 2017-10-11 DIAGNOSIS — M170 Bilateral primary osteoarthritis of knee: Secondary | ICD-10-CM | POA: Diagnosis not present

## 2017-10-14 DIAGNOSIS — L97822 Non-pressure chronic ulcer of other part of left lower leg with fat layer exposed: Secondary | ICD-10-CM | POA: Diagnosis not present

## 2017-10-14 DIAGNOSIS — E11622 Type 2 diabetes mellitus with other skin ulcer: Secondary | ICD-10-CM | POA: Diagnosis not present

## 2017-10-20 DIAGNOSIS — M1990 Unspecified osteoarthritis, unspecified site: Secondary | ICD-10-CM | POA: Diagnosis not present

## 2017-10-20 DIAGNOSIS — E118 Type 2 diabetes mellitus with unspecified complications: Secondary | ICD-10-CM | POA: Diagnosis not present

## 2017-10-21 DIAGNOSIS — M170 Bilateral primary osteoarthritis of knee: Secondary | ICD-10-CM | POA: Diagnosis not present

## 2017-10-21 DIAGNOSIS — Z6841 Body Mass Index (BMI) 40.0 and over, adult: Secondary | ICD-10-CM | POA: Diagnosis not present

## 2017-10-21 DIAGNOSIS — L97222 Non-pressure chronic ulcer of left calf with fat layer exposed: Secondary | ICD-10-CM | POA: Diagnosis not present

## 2017-10-21 DIAGNOSIS — I87322 Chronic venous hypertension (idiopathic) with inflammation of left lower extremity: Secondary | ICD-10-CM | POA: Diagnosis not present

## 2017-10-21 DIAGNOSIS — E119 Type 2 diabetes mellitus without complications: Secondary | ICD-10-CM | POA: Diagnosis not present

## 2017-10-21 DIAGNOSIS — I890 Lymphedema, not elsewhere classified: Secondary | ICD-10-CM | POA: Diagnosis not present

## 2017-10-25 DIAGNOSIS — E119 Type 2 diabetes mellitus without complications: Secondary | ICD-10-CM | POA: Diagnosis not present

## 2017-10-25 DIAGNOSIS — L97222 Non-pressure chronic ulcer of left calf with fat layer exposed: Secondary | ICD-10-CM | POA: Diagnosis not present

## 2017-10-25 DIAGNOSIS — I890 Lymphedema, not elsewhere classified: Secondary | ICD-10-CM | POA: Diagnosis not present

## 2017-10-25 DIAGNOSIS — I87322 Chronic venous hypertension (idiopathic) with inflammation of left lower extremity: Secondary | ICD-10-CM | POA: Diagnosis not present

## 2017-10-25 DIAGNOSIS — Z6841 Body Mass Index (BMI) 40.0 and over, adult: Secondary | ICD-10-CM | POA: Diagnosis not present

## 2017-10-25 DIAGNOSIS — M170 Bilateral primary osteoarthritis of knee: Secondary | ICD-10-CM | POA: Diagnosis not present

## 2017-10-28 ENCOUNTER — Encounter (HOSPITAL_BASED_OUTPATIENT_CLINIC_OR_DEPARTMENT_OTHER): Payer: BLUE CROSS/BLUE SHIELD | Attending: Internal Medicine

## 2017-10-28 DIAGNOSIS — L97822 Non-pressure chronic ulcer of other part of left lower leg with fat layer exposed: Secondary | ICD-10-CM | POA: Diagnosis not present

## 2017-10-28 DIAGNOSIS — E6609 Other obesity due to excess calories: Secondary | ICD-10-CM | POA: Diagnosis not present

## 2017-10-28 DIAGNOSIS — I87322 Chronic venous hypertension (idiopathic) with inflammation of left lower extremity: Secondary | ICD-10-CM | POA: Insufficient documentation

## 2017-10-28 DIAGNOSIS — L98492 Non-pressure chronic ulcer of skin of other sites with fat layer exposed: Secondary | ICD-10-CM | POA: Insufficient documentation

## 2017-10-28 DIAGNOSIS — E1140 Type 2 diabetes mellitus with diabetic neuropathy, unspecified: Secondary | ICD-10-CM | POA: Diagnosis not present

## 2017-10-28 DIAGNOSIS — Z6841 Body Mass Index (BMI) 40.0 and over, adult: Secondary | ICD-10-CM | POA: Insufficient documentation

## 2017-10-28 DIAGNOSIS — E11622 Type 2 diabetes mellitus with other skin ulcer: Secondary | ICD-10-CM | POA: Diagnosis not present

## 2017-10-28 DIAGNOSIS — I890 Lymphedema, not elsewhere classified: Secondary | ICD-10-CM | POA: Insufficient documentation

## 2017-10-28 DIAGNOSIS — Z7984 Long term (current) use of oral hypoglycemic drugs: Secondary | ICD-10-CM | POA: Insufficient documentation

## 2017-11-03 DIAGNOSIS — L97222 Non-pressure chronic ulcer of left calf with fat layer exposed: Secondary | ICD-10-CM | POA: Diagnosis not present

## 2017-11-03 DIAGNOSIS — M170 Bilateral primary osteoarthritis of knee: Secondary | ICD-10-CM | POA: Diagnosis not present

## 2017-11-03 DIAGNOSIS — I87322 Chronic venous hypertension (idiopathic) with inflammation of left lower extremity: Secondary | ICD-10-CM | POA: Diagnosis not present

## 2017-11-03 DIAGNOSIS — I890 Lymphedema, not elsewhere classified: Secondary | ICD-10-CM | POA: Diagnosis not present

## 2017-11-03 DIAGNOSIS — Z6841 Body Mass Index (BMI) 40.0 and over, adult: Secondary | ICD-10-CM | POA: Diagnosis not present

## 2017-11-03 DIAGNOSIS — E119 Type 2 diabetes mellitus without complications: Secondary | ICD-10-CM | POA: Diagnosis not present

## 2017-11-08 DIAGNOSIS — L97822 Non-pressure chronic ulcer of other part of left lower leg with fat layer exposed: Secondary | ICD-10-CM | POA: Diagnosis not present

## 2017-11-08 DIAGNOSIS — I87322 Chronic venous hypertension (idiopathic) with inflammation of left lower extremity: Secondary | ICD-10-CM | POA: Diagnosis not present

## 2017-11-08 DIAGNOSIS — E6609 Other obesity due to excess calories: Secondary | ICD-10-CM | POA: Diagnosis not present

## 2017-11-08 DIAGNOSIS — Z6841 Body Mass Index (BMI) 40.0 and over, adult: Secondary | ICD-10-CM | POA: Diagnosis not present

## 2017-11-08 DIAGNOSIS — Z7984 Long term (current) use of oral hypoglycemic drugs: Secondary | ICD-10-CM | POA: Diagnosis not present

## 2017-11-08 DIAGNOSIS — I890 Lymphedema, not elsewhere classified: Secondary | ICD-10-CM | POA: Diagnosis not present

## 2017-11-08 DIAGNOSIS — E11622 Type 2 diabetes mellitus with other skin ulcer: Secondary | ICD-10-CM | POA: Diagnosis not present

## 2017-11-08 DIAGNOSIS — L98492 Non-pressure chronic ulcer of skin of other sites with fat layer exposed: Secondary | ICD-10-CM | POA: Diagnosis not present

## 2017-11-09 ENCOUNTER — Other Ambulatory Visit (HOSPITAL_COMMUNITY)
Admit: 2017-11-09 | Discharge: 2017-11-09 | Disposition: A | Discharge status: Home / Self Care | Payer: BLUE CROSS/BLUE SHIELD | Source: Ambulatory Visit | Attending: Internal Medicine | Admitting: Internal Medicine

## 2017-11-09 DIAGNOSIS — I87322 Chronic venous hypertension (idiopathic) with inflammation of left lower extremity: Secondary | ICD-10-CM | POA: Insufficient documentation

## 2017-11-12 DIAGNOSIS — I87322 Chronic venous hypertension (idiopathic) with inflammation of left lower extremity: Secondary | ICD-10-CM | POA: Diagnosis not present

## 2017-11-12 DIAGNOSIS — M170 Bilateral primary osteoarthritis of knee: Secondary | ICD-10-CM | POA: Diagnosis not present

## 2017-11-12 DIAGNOSIS — E119 Type 2 diabetes mellitus without complications: Secondary | ICD-10-CM | POA: Diagnosis not present

## 2017-11-12 DIAGNOSIS — L97222 Non-pressure chronic ulcer of left calf with fat layer exposed: Secondary | ICD-10-CM | POA: Diagnosis not present

## 2017-11-12 DIAGNOSIS — Z6841 Body Mass Index (BMI) 40.0 and over, adult: Secondary | ICD-10-CM | POA: Diagnosis not present

## 2017-11-12 DIAGNOSIS — I890 Lymphedema, not elsewhere classified: Secondary | ICD-10-CM | POA: Diagnosis not present

## 2017-11-12 LAB — AEROBIC CULTURE W GRAM STAIN (SUPERFICIAL SPECIMEN)

## 2017-11-12 LAB — AEROBIC CULTURE  (SUPERFICIAL SPECIMEN)

## 2017-11-19 DIAGNOSIS — I890 Lymphedema, not elsewhere classified: Secondary | ICD-10-CM | POA: Diagnosis not present

## 2017-11-19 DIAGNOSIS — I87322 Chronic venous hypertension (idiopathic) with inflammation of left lower extremity: Secondary | ICD-10-CM | POA: Diagnosis not present

## 2017-11-19 DIAGNOSIS — L97222 Non-pressure chronic ulcer of left calf with fat layer exposed: Secondary | ICD-10-CM | POA: Diagnosis not present

## 2017-11-19 DIAGNOSIS — M170 Bilateral primary osteoarthritis of knee: Secondary | ICD-10-CM | POA: Diagnosis not present

## 2017-11-19 DIAGNOSIS — Z6841 Body Mass Index (BMI) 40.0 and over, adult: Secondary | ICD-10-CM | POA: Diagnosis not present

## 2017-11-19 DIAGNOSIS — E119 Type 2 diabetes mellitus without complications: Secondary | ICD-10-CM | POA: Diagnosis not present

## 2017-11-22 DIAGNOSIS — M170 Bilateral primary osteoarthritis of knee: Secondary | ICD-10-CM | POA: Diagnosis not present

## 2017-11-22 DIAGNOSIS — Z6841 Body Mass Index (BMI) 40.0 and over, adult: Secondary | ICD-10-CM | POA: Diagnosis not present

## 2017-11-22 DIAGNOSIS — L97222 Non-pressure chronic ulcer of left calf with fat layer exposed: Secondary | ICD-10-CM | POA: Diagnosis not present

## 2017-11-22 DIAGNOSIS — I890 Lymphedema, not elsewhere classified: Secondary | ICD-10-CM | POA: Diagnosis not present

## 2017-11-22 DIAGNOSIS — I87322 Chronic venous hypertension (idiopathic) with inflammation of left lower extremity: Secondary | ICD-10-CM | POA: Diagnosis not present

## 2017-11-22 DIAGNOSIS — E119 Type 2 diabetes mellitus without complications: Secondary | ICD-10-CM | POA: Diagnosis not present

## 2017-11-25 ENCOUNTER — Encounter (HOSPITAL_BASED_OUTPATIENT_CLINIC_OR_DEPARTMENT_OTHER): Payer: BLUE CROSS/BLUE SHIELD | Attending: Internal Medicine

## 2017-11-25 DIAGNOSIS — I87322 Chronic venous hypertension (idiopathic) with inflammation of left lower extremity: Secondary | ICD-10-CM | POA: Insufficient documentation

## 2017-11-25 DIAGNOSIS — I890 Lymphedema, not elsewhere classified: Secondary | ICD-10-CM | POA: Insufficient documentation

## 2017-11-25 DIAGNOSIS — E669 Obesity, unspecified: Secondary | ICD-10-CM | POA: Insufficient documentation

## 2017-11-25 DIAGNOSIS — E11622 Type 2 diabetes mellitus with other skin ulcer: Secondary | ICD-10-CM | POA: Insufficient documentation

## 2017-11-25 DIAGNOSIS — B9561 Methicillin susceptible Staphylococcus aureus infection as the cause of diseases classified elsewhere: Secondary | ICD-10-CM | POA: Insufficient documentation

## 2017-11-25 DIAGNOSIS — L97221 Non-pressure chronic ulcer of left calf limited to breakdown of skin: Secondary | ICD-10-CM | POA: Insufficient documentation

## 2017-11-25 DIAGNOSIS — Z6841 Body Mass Index (BMI) 40.0 and over, adult: Secondary | ICD-10-CM | POA: Insufficient documentation

## 2017-11-30 DIAGNOSIS — E119 Type 2 diabetes mellitus without complications: Secondary | ICD-10-CM | POA: Diagnosis not present

## 2017-11-30 DIAGNOSIS — Z6841 Body Mass Index (BMI) 40.0 and over, adult: Secondary | ICD-10-CM | POA: Diagnosis not present

## 2017-11-30 DIAGNOSIS — I890 Lymphedema, not elsewhere classified: Secondary | ICD-10-CM | POA: Diagnosis not present

## 2017-11-30 DIAGNOSIS — M170 Bilateral primary osteoarthritis of knee: Secondary | ICD-10-CM | POA: Diagnosis not present

## 2017-11-30 DIAGNOSIS — I87322 Chronic venous hypertension (idiopathic) with inflammation of left lower extremity: Secondary | ICD-10-CM | POA: Diagnosis not present

## 2017-11-30 DIAGNOSIS — L97222 Non-pressure chronic ulcer of left calf with fat layer exposed: Secondary | ICD-10-CM | POA: Diagnosis not present

## 2017-12-02 DIAGNOSIS — I87322 Chronic venous hypertension (idiopathic) with inflammation of left lower extremity: Secondary | ICD-10-CM | POA: Diagnosis not present

## 2017-12-02 DIAGNOSIS — E669 Obesity, unspecified: Secondary | ICD-10-CM | POA: Diagnosis not present

## 2017-12-02 DIAGNOSIS — L97221 Non-pressure chronic ulcer of left calf limited to breakdown of skin: Secondary | ICD-10-CM | POA: Diagnosis not present

## 2017-12-02 DIAGNOSIS — Z6841 Body Mass Index (BMI) 40.0 and over, adult: Secondary | ICD-10-CM | POA: Diagnosis not present

## 2017-12-02 DIAGNOSIS — B9561 Methicillin susceptible Staphylococcus aureus infection as the cause of diseases classified elsewhere: Secondary | ICD-10-CM | POA: Diagnosis not present

## 2017-12-02 DIAGNOSIS — I890 Lymphedema, not elsewhere classified: Secondary | ICD-10-CM | POA: Diagnosis not present

## 2017-12-02 DIAGNOSIS — E11622 Type 2 diabetes mellitus with other skin ulcer: Secondary | ICD-10-CM | POA: Diagnosis not present

## 2017-12-02 DIAGNOSIS — L97222 Non-pressure chronic ulcer of left calf with fat layer exposed: Secondary | ICD-10-CM | POA: Diagnosis not present

## 2017-12-07 DIAGNOSIS — Z6841 Body Mass Index (BMI) 40.0 and over, adult: Secondary | ICD-10-CM | POA: Diagnosis not present

## 2017-12-07 DIAGNOSIS — E119 Type 2 diabetes mellitus without complications: Secondary | ICD-10-CM | POA: Diagnosis not present

## 2017-12-07 DIAGNOSIS — I890 Lymphedema, not elsewhere classified: Secondary | ICD-10-CM | POA: Diagnosis not present

## 2017-12-07 DIAGNOSIS — I87322 Chronic venous hypertension (idiopathic) with inflammation of left lower extremity: Secondary | ICD-10-CM | POA: Diagnosis not present

## 2017-12-07 DIAGNOSIS — M170 Bilateral primary osteoarthritis of knee: Secondary | ICD-10-CM | POA: Diagnosis not present

## 2017-12-07 DIAGNOSIS — L97222 Non-pressure chronic ulcer of left calf with fat layer exposed: Secondary | ICD-10-CM | POA: Diagnosis not present

## 2017-12-10 DIAGNOSIS — M170 Bilateral primary osteoarthritis of knee: Secondary | ICD-10-CM | POA: Diagnosis not present

## 2017-12-10 DIAGNOSIS — E119 Type 2 diabetes mellitus without complications: Secondary | ICD-10-CM | POA: Diagnosis not present

## 2017-12-10 DIAGNOSIS — Z6841 Body Mass Index (BMI) 40.0 and over, adult: Secondary | ICD-10-CM | POA: Diagnosis not present

## 2017-12-10 DIAGNOSIS — I890 Lymphedema, not elsewhere classified: Secondary | ICD-10-CM | POA: Diagnosis not present

## 2017-12-10 DIAGNOSIS — L97222 Non-pressure chronic ulcer of left calf with fat layer exposed: Secondary | ICD-10-CM | POA: Diagnosis not present

## 2017-12-10 DIAGNOSIS — I87322 Chronic venous hypertension (idiopathic) with inflammation of left lower extremity: Secondary | ICD-10-CM | POA: Diagnosis not present

## 2017-12-14 DIAGNOSIS — M170 Bilateral primary osteoarthritis of knee: Secondary | ICD-10-CM | POA: Diagnosis not present

## 2017-12-14 DIAGNOSIS — L97222 Non-pressure chronic ulcer of left calf with fat layer exposed: Secondary | ICD-10-CM | POA: Diagnosis not present

## 2017-12-14 DIAGNOSIS — E119 Type 2 diabetes mellitus without complications: Secondary | ICD-10-CM | POA: Diagnosis not present

## 2017-12-14 DIAGNOSIS — I890 Lymphedema, not elsewhere classified: Secondary | ICD-10-CM | POA: Diagnosis not present

## 2017-12-14 DIAGNOSIS — I87322 Chronic venous hypertension (idiopathic) with inflammation of left lower extremity: Secondary | ICD-10-CM | POA: Diagnosis not present

## 2017-12-14 DIAGNOSIS — Z6841 Body Mass Index (BMI) 40.0 and over, adult: Secondary | ICD-10-CM | POA: Diagnosis not present

## 2017-12-17 DIAGNOSIS — I890 Lymphedema, not elsewhere classified: Secondary | ICD-10-CM | POA: Diagnosis not present

## 2017-12-17 DIAGNOSIS — E119 Type 2 diabetes mellitus without complications: Secondary | ICD-10-CM | POA: Diagnosis not present

## 2017-12-17 DIAGNOSIS — L97222 Non-pressure chronic ulcer of left calf with fat layer exposed: Secondary | ICD-10-CM | POA: Diagnosis not present

## 2017-12-17 DIAGNOSIS — Z6841 Body Mass Index (BMI) 40.0 and over, adult: Secondary | ICD-10-CM | POA: Diagnosis not present

## 2017-12-17 DIAGNOSIS — I87322 Chronic venous hypertension (idiopathic) with inflammation of left lower extremity: Secondary | ICD-10-CM | POA: Diagnosis not present

## 2017-12-17 DIAGNOSIS — M170 Bilateral primary osteoarthritis of knee: Secondary | ICD-10-CM | POA: Diagnosis not present

## 2017-12-20 DIAGNOSIS — I87322 Chronic venous hypertension (idiopathic) with inflammation of left lower extremity: Secondary | ICD-10-CM | POA: Diagnosis not present

## 2017-12-20 DIAGNOSIS — Z6841 Body Mass Index (BMI) 40.0 and over, adult: Secondary | ICD-10-CM | POA: Diagnosis not present

## 2017-12-20 DIAGNOSIS — E119 Type 2 diabetes mellitus without complications: Secondary | ICD-10-CM | POA: Diagnosis not present

## 2017-12-20 DIAGNOSIS — M170 Bilateral primary osteoarthritis of knee: Secondary | ICD-10-CM | POA: Diagnosis not present

## 2017-12-20 DIAGNOSIS — I890 Lymphedema, not elsewhere classified: Secondary | ICD-10-CM | POA: Diagnosis not present

## 2017-12-20 DIAGNOSIS — L97222 Non-pressure chronic ulcer of left calf with fat layer exposed: Secondary | ICD-10-CM | POA: Diagnosis not present

## 2017-12-22 DIAGNOSIS — I890 Lymphedema, not elsewhere classified: Secondary | ICD-10-CM | POA: Diagnosis not present

## 2017-12-22 DIAGNOSIS — Z6841 Body Mass Index (BMI) 40.0 and over, adult: Secondary | ICD-10-CM | POA: Diagnosis not present

## 2017-12-22 DIAGNOSIS — I87322 Chronic venous hypertension (idiopathic) with inflammation of left lower extremity: Secondary | ICD-10-CM | POA: Diagnosis not present

## 2017-12-22 DIAGNOSIS — E119 Type 2 diabetes mellitus without complications: Secondary | ICD-10-CM | POA: Diagnosis not present

## 2017-12-22 DIAGNOSIS — M170 Bilateral primary osteoarthritis of knee: Secondary | ICD-10-CM | POA: Diagnosis not present

## 2017-12-22 DIAGNOSIS — L97222 Non-pressure chronic ulcer of left calf with fat layer exposed: Secondary | ICD-10-CM | POA: Diagnosis not present

## 2017-12-24 ENCOUNTER — Encounter (HOSPITAL_BASED_OUTPATIENT_CLINIC_OR_DEPARTMENT_OTHER): Payer: BLUE CROSS/BLUE SHIELD | Attending: Internal Medicine

## 2017-12-24 DIAGNOSIS — E11622 Type 2 diabetes mellitus with other skin ulcer: Secondary | ICD-10-CM | POA: Insufficient documentation

## 2017-12-24 DIAGNOSIS — L97822 Non-pressure chronic ulcer of other part of left lower leg with fat layer exposed: Secondary | ICD-10-CM | POA: Diagnosis not present

## 2017-12-24 DIAGNOSIS — E6609 Other obesity due to excess calories: Secondary | ICD-10-CM | POA: Diagnosis not present

## 2017-12-24 DIAGNOSIS — Z6841 Body Mass Index (BMI) 40.0 and over, adult: Secondary | ICD-10-CM | POA: Insufficient documentation

## 2017-12-24 DIAGNOSIS — I890 Lymphedema, not elsewhere classified: Secondary | ICD-10-CM | POA: Diagnosis not present

## 2017-12-24 DIAGNOSIS — I87322 Chronic venous hypertension (idiopathic) with inflammation of left lower extremity: Secondary | ICD-10-CM | POA: Insufficient documentation

## 2017-12-28 DIAGNOSIS — M170 Bilateral primary osteoarthritis of knee: Secondary | ICD-10-CM | POA: Diagnosis not present

## 2017-12-28 DIAGNOSIS — Z6841 Body Mass Index (BMI) 40.0 and over, adult: Secondary | ICD-10-CM | POA: Diagnosis not present

## 2017-12-28 DIAGNOSIS — I87322 Chronic venous hypertension (idiopathic) with inflammation of left lower extremity: Secondary | ICD-10-CM | POA: Diagnosis not present

## 2017-12-28 DIAGNOSIS — E119 Type 2 diabetes mellitus without complications: Secondary | ICD-10-CM | POA: Diagnosis not present

## 2017-12-28 DIAGNOSIS — I890 Lymphedema, not elsewhere classified: Secondary | ICD-10-CM | POA: Diagnosis not present

## 2017-12-28 DIAGNOSIS — L97222 Non-pressure chronic ulcer of left calf with fat layer exposed: Secondary | ICD-10-CM | POA: Diagnosis not present

## 2017-12-30 DIAGNOSIS — M170 Bilateral primary osteoarthritis of knee: Secondary | ICD-10-CM | POA: Diagnosis not present

## 2017-12-30 DIAGNOSIS — I890 Lymphedema, not elsewhere classified: Secondary | ICD-10-CM | POA: Diagnosis not present

## 2017-12-30 DIAGNOSIS — I87322 Chronic venous hypertension (idiopathic) with inflammation of left lower extremity: Secondary | ICD-10-CM | POA: Diagnosis not present

## 2017-12-30 DIAGNOSIS — Z6841 Body Mass Index (BMI) 40.0 and over, adult: Secondary | ICD-10-CM | POA: Diagnosis not present

## 2017-12-30 DIAGNOSIS — L97222 Non-pressure chronic ulcer of left calf with fat layer exposed: Secondary | ICD-10-CM | POA: Diagnosis not present

## 2017-12-30 DIAGNOSIS — E119 Type 2 diabetes mellitus without complications: Secondary | ICD-10-CM | POA: Diagnosis not present

## 2018-01-04 DIAGNOSIS — E119 Type 2 diabetes mellitus without complications: Secondary | ICD-10-CM | POA: Diagnosis not present

## 2018-01-04 DIAGNOSIS — I87322 Chronic venous hypertension (idiopathic) with inflammation of left lower extremity: Secondary | ICD-10-CM | POA: Diagnosis not present

## 2018-01-04 DIAGNOSIS — L97222 Non-pressure chronic ulcer of left calf with fat layer exposed: Secondary | ICD-10-CM | POA: Diagnosis not present

## 2018-01-04 DIAGNOSIS — I890 Lymphedema, not elsewhere classified: Secondary | ICD-10-CM | POA: Diagnosis not present

## 2018-01-04 DIAGNOSIS — M170 Bilateral primary osteoarthritis of knee: Secondary | ICD-10-CM | POA: Diagnosis not present

## 2018-01-04 DIAGNOSIS — Z6841 Body Mass Index (BMI) 40.0 and over, adult: Secondary | ICD-10-CM | POA: Diagnosis not present

## 2018-01-05 DIAGNOSIS — M170 Bilateral primary osteoarthritis of knee: Secondary | ICD-10-CM | POA: Diagnosis not present

## 2018-01-05 DIAGNOSIS — I87322 Chronic venous hypertension (idiopathic) with inflammation of left lower extremity: Secondary | ICD-10-CM | POA: Diagnosis not present

## 2018-01-05 DIAGNOSIS — L97222 Non-pressure chronic ulcer of left calf with fat layer exposed: Secondary | ICD-10-CM | POA: Diagnosis not present

## 2018-01-05 DIAGNOSIS — E119 Type 2 diabetes mellitus without complications: Secondary | ICD-10-CM | POA: Diagnosis not present

## 2018-01-05 DIAGNOSIS — I890 Lymphedema, not elsewhere classified: Secondary | ICD-10-CM | POA: Diagnosis not present

## 2018-01-05 DIAGNOSIS — Z6841 Body Mass Index (BMI) 40.0 and over, adult: Secondary | ICD-10-CM | POA: Diagnosis not present

## 2018-01-10 DIAGNOSIS — I87322 Chronic venous hypertension (idiopathic) with inflammation of left lower extremity: Secondary | ICD-10-CM | POA: Diagnosis not present

## 2018-01-10 DIAGNOSIS — M170 Bilateral primary osteoarthritis of knee: Secondary | ICD-10-CM | POA: Diagnosis not present

## 2018-01-10 DIAGNOSIS — E119 Type 2 diabetes mellitus without complications: Secondary | ICD-10-CM | POA: Diagnosis not present

## 2018-01-10 DIAGNOSIS — I890 Lymphedema, not elsewhere classified: Secondary | ICD-10-CM | POA: Diagnosis not present

## 2018-01-10 DIAGNOSIS — L97222 Non-pressure chronic ulcer of left calf with fat layer exposed: Secondary | ICD-10-CM | POA: Diagnosis not present

## 2018-01-10 DIAGNOSIS — Z6841 Body Mass Index (BMI) 40.0 and over, adult: Secondary | ICD-10-CM | POA: Diagnosis not present

## 2018-01-14 DIAGNOSIS — I890 Lymphedema, not elsewhere classified: Secondary | ICD-10-CM | POA: Diagnosis not present

## 2018-01-14 DIAGNOSIS — I87322 Chronic venous hypertension (idiopathic) with inflammation of left lower extremity: Secondary | ICD-10-CM | POA: Diagnosis not present

## 2018-01-14 DIAGNOSIS — E119 Type 2 diabetes mellitus without complications: Secondary | ICD-10-CM | POA: Diagnosis not present

## 2018-01-14 DIAGNOSIS — Z6841 Body Mass Index (BMI) 40.0 and over, adult: Secondary | ICD-10-CM | POA: Diagnosis not present

## 2018-01-14 DIAGNOSIS — M170 Bilateral primary osteoarthritis of knee: Secondary | ICD-10-CM | POA: Diagnosis not present

## 2018-01-14 DIAGNOSIS — L97222 Non-pressure chronic ulcer of left calf with fat layer exposed: Secondary | ICD-10-CM | POA: Diagnosis not present

## 2018-01-20 ENCOUNTER — Encounter (HOSPITAL_BASED_OUTPATIENT_CLINIC_OR_DEPARTMENT_OTHER): Payer: BLUE CROSS/BLUE SHIELD | Attending: Internal Medicine

## 2018-01-20 ENCOUNTER — Other Ambulatory Visit (HOSPITAL_COMMUNITY)
Admission: RE | Admit: 2018-01-20 | Discharge: 2018-01-20 | Disposition: A | Discharge status: Home / Self Care | Payer: BLUE CROSS/BLUE SHIELD | Source: Ambulatory Visit | Attending: Internal Medicine | Admitting: Internal Medicine

## 2018-01-20 DIAGNOSIS — I87332 Chronic venous hypertension (idiopathic) with ulcer and inflammation of left lower extremity: Secondary | ICD-10-CM | POA: Insufficient documentation

## 2018-01-20 DIAGNOSIS — S81002A Unspecified open wound, left knee, initial encounter: Secondary | ICD-10-CM | POA: Diagnosis not present

## 2018-01-20 DIAGNOSIS — I890 Lymphedema, not elsewhere classified: Secondary | ICD-10-CM | POA: Insufficient documentation

## 2018-01-20 DIAGNOSIS — E11622 Type 2 diabetes mellitus with other skin ulcer: Secondary | ICD-10-CM | POA: Diagnosis not present

## 2018-01-20 DIAGNOSIS — Z6841 Body Mass Index (BMI) 40.0 and over, adult: Secondary | ICD-10-CM | POA: Diagnosis not present

## 2018-01-20 DIAGNOSIS — X58XXXA Exposure to other specified factors, initial encounter: Secondary | ICD-10-CM | POA: Insufficient documentation

## 2018-01-20 DIAGNOSIS — L97822 Non-pressure chronic ulcer of other part of left lower leg with fat layer exposed: Secondary | ICD-10-CM | POA: Insufficient documentation

## 2018-01-20 DIAGNOSIS — E669 Obesity, unspecified: Secondary | ICD-10-CM | POA: Diagnosis not present

## 2018-01-23 LAB — AEROBIC CULTURE  (SUPERFICIAL SPECIMEN)

## 2018-01-23 LAB — AEROBIC CULTURE W GRAM STAIN (SUPERFICIAL SPECIMEN)

## 2018-01-28 DIAGNOSIS — I890 Lymphedema, not elsewhere classified: Secondary | ICD-10-CM | POA: Diagnosis not present

## 2018-01-28 DIAGNOSIS — I87322 Chronic venous hypertension (idiopathic) with inflammation of left lower extremity: Secondary | ICD-10-CM | POA: Diagnosis not present

## 2018-01-28 DIAGNOSIS — L97222 Non-pressure chronic ulcer of left calf with fat layer exposed: Secondary | ICD-10-CM | POA: Diagnosis not present

## 2018-01-28 DIAGNOSIS — Z6841 Body Mass Index (BMI) 40.0 and over, adult: Secondary | ICD-10-CM | POA: Diagnosis not present

## 2018-01-28 DIAGNOSIS — E119 Type 2 diabetes mellitus without complications: Secondary | ICD-10-CM | POA: Diagnosis not present

## 2018-01-28 DIAGNOSIS — M170 Bilateral primary osteoarthritis of knee: Secondary | ICD-10-CM | POA: Diagnosis not present

## 2018-01-31 DIAGNOSIS — I890 Lymphedema, not elsewhere classified: Secondary | ICD-10-CM | POA: Diagnosis not present

## 2018-01-31 DIAGNOSIS — Z6841 Body Mass Index (BMI) 40.0 and over, adult: Secondary | ICD-10-CM | POA: Diagnosis not present

## 2018-01-31 DIAGNOSIS — L97222 Non-pressure chronic ulcer of left calf with fat layer exposed: Secondary | ICD-10-CM | POA: Diagnosis not present

## 2018-01-31 DIAGNOSIS — M170 Bilateral primary osteoarthritis of knee: Secondary | ICD-10-CM | POA: Diagnosis not present

## 2018-01-31 DIAGNOSIS — Z7984 Long term (current) use of oral hypoglycemic drugs: Secondary | ICD-10-CM | POA: Diagnosis not present

## 2018-01-31 DIAGNOSIS — E119 Type 2 diabetes mellitus without complications: Secondary | ICD-10-CM | POA: Diagnosis not present

## 2018-01-31 DIAGNOSIS — I87322 Chronic venous hypertension (idiopathic) with inflammation of left lower extremity: Secondary | ICD-10-CM | POA: Diagnosis not present

## 2018-02-04 DIAGNOSIS — Z7984 Long term (current) use of oral hypoglycemic drugs: Secondary | ICD-10-CM | POA: Diagnosis not present

## 2018-02-04 DIAGNOSIS — M170 Bilateral primary osteoarthritis of knee: Secondary | ICD-10-CM | POA: Diagnosis not present

## 2018-02-04 DIAGNOSIS — Z6841 Body Mass Index (BMI) 40.0 and over, adult: Secondary | ICD-10-CM | POA: Diagnosis not present

## 2018-02-04 DIAGNOSIS — E119 Type 2 diabetes mellitus without complications: Secondary | ICD-10-CM | POA: Diagnosis not present

## 2018-02-04 DIAGNOSIS — I87322 Chronic venous hypertension (idiopathic) with inflammation of left lower extremity: Secondary | ICD-10-CM | POA: Diagnosis not present

## 2018-02-04 DIAGNOSIS — I890 Lymphedema, not elsewhere classified: Secondary | ICD-10-CM | POA: Diagnosis not present

## 2018-02-04 DIAGNOSIS — L97222 Non-pressure chronic ulcer of left calf with fat layer exposed: Secondary | ICD-10-CM | POA: Diagnosis not present

## 2018-02-07 DIAGNOSIS — Z7984 Long term (current) use of oral hypoglycemic drugs: Secondary | ICD-10-CM | POA: Diagnosis not present

## 2018-02-07 DIAGNOSIS — I890 Lymphedema, not elsewhere classified: Secondary | ICD-10-CM | POA: Diagnosis not present

## 2018-02-07 DIAGNOSIS — I87322 Chronic venous hypertension (idiopathic) with inflammation of left lower extremity: Secondary | ICD-10-CM | POA: Diagnosis not present

## 2018-02-07 DIAGNOSIS — Z6841 Body Mass Index (BMI) 40.0 and over, adult: Secondary | ICD-10-CM | POA: Diagnosis not present

## 2018-02-07 DIAGNOSIS — L97222 Non-pressure chronic ulcer of left calf with fat layer exposed: Secondary | ICD-10-CM | POA: Diagnosis not present

## 2018-02-07 DIAGNOSIS — M170 Bilateral primary osteoarthritis of knee: Secondary | ICD-10-CM | POA: Diagnosis not present

## 2018-02-07 DIAGNOSIS — E119 Type 2 diabetes mellitus without complications: Secondary | ICD-10-CM | POA: Diagnosis not present

## 2018-02-10 DIAGNOSIS — L97822 Non-pressure chronic ulcer of other part of left lower leg with fat layer exposed: Secondary | ICD-10-CM | POA: Diagnosis not present

## 2018-02-10 DIAGNOSIS — E11622 Type 2 diabetes mellitus with other skin ulcer: Secondary | ICD-10-CM | POA: Diagnosis not present

## 2018-02-10 DIAGNOSIS — R609 Edema, unspecified: Secondary | ICD-10-CM | POA: Diagnosis not present

## 2018-02-10 DIAGNOSIS — I890 Lymphedema, not elsewhere classified: Secondary | ICD-10-CM | POA: Diagnosis not present

## 2018-02-10 DIAGNOSIS — E669 Obesity, unspecified: Secondary | ICD-10-CM | POA: Diagnosis not present

## 2018-02-10 DIAGNOSIS — I87332 Chronic venous hypertension (idiopathic) with ulcer and inflammation of left lower extremity: Secondary | ICD-10-CM | POA: Diagnosis not present

## 2018-02-10 DIAGNOSIS — Z6841 Body Mass Index (BMI) 40.0 and over, adult: Secondary | ICD-10-CM | POA: Diagnosis not present

## 2018-02-15 DIAGNOSIS — Z6841 Body Mass Index (BMI) 40.0 and over, adult: Secondary | ICD-10-CM | POA: Diagnosis not present

## 2018-02-15 DIAGNOSIS — I890 Lymphedema, not elsewhere classified: Secondary | ICD-10-CM | POA: Diagnosis not present

## 2018-02-15 DIAGNOSIS — M170 Bilateral primary osteoarthritis of knee: Secondary | ICD-10-CM | POA: Diagnosis not present

## 2018-02-15 DIAGNOSIS — Z7984 Long term (current) use of oral hypoglycemic drugs: Secondary | ICD-10-CM | POA: Diagnosis not present

## 2018-02-15 DIAGNOSIS — E119 Type 2 diabetes mellitus without complications: Secondary | ICD-10-CM | POA: Diagnosis not present

## 2018-02-15 DIAGNOSIS — I87322 Chronic venous hypertension (idiopathic) with inflammation of left lower extremity: Secondary | ICD-10-CM | POA: Diagnosis not present

## 2018-02-15 DIAGNOSIS — L97222 Non-pressure chronic ulcer of left calf with fat layer exposed: Secondary | ICD-10-CM | POA: Diagnosis not present

## 2018-02-23 DIAGNOSIS — L97222 Non-pressure chronic ulcer of left calf with fat layer exposed: Secondary | ICD-10-CM | POA: Diagnosis not present

## 2018-02-23 DIAGNOSIS — M170 Bilateral primary osteoarthritis of knee: Secondary | ICD-10-CM | POA: Diagnosis not present

## 2018-02-23 DIAGNOSIS — I890 Lymphedema, not elsewhere classified: Secondary | ICD-10-CM | POA: Diagnosis not present

## 2018-02-23 DIAGNOSIS — I87322 Chronic venous hypertension (idiopathic) with inflammation of left lower extremity: Secondary | ICD-10-CM | POA: Diagnosis not present

## 2018-02-23 DIAGNOSIS — Z7984 Long term (current) use of oral hypoglycemic drugs: Secondary | ICD-10-CM | POA: Diagnosis not present

## 2018-02-23 DIAGNOSIS — Z6841 Body Mass Index (BMI) 40.0 and over, adult: Secondary | ICD-10-CM | POA: Diagnosis not present

## 2018-02-23 DIAGNOSIS — E119 Type 2 diabetes mellitus without complications: Secondary | ICD-10-CM | POA: Diagnosis not present

## 2018-03-02 DIAGNOSIS — M170 Bilateral primary osteoarthritis of knee: Secondary | ICD-10-CM | POA: Diagnosis not present

## 2018-03-02 DIAGNOSIS — L97222 Non-pressure chronic ulcer of left calf with fat layer exposed: Secondary | ICD-10-CM | POA: Diagnosis not present

## 2018-03-02 DIAGNOSIS — I87322 Chronic venous hypertension (idiopathic) with inflammation of left lower extremity: Secondary | ICD-10-CM | POA: Diagnosis not present

## 2018-03-02 DIAGNOSIS — I890 Lymphedema, not elsewhere classified: Secondary | ICD-10-CM | POA: Diagnosis not present

## 2018-03-02 DIAGNOSIS — Z7984 Long term (current) use of oral hypoglycemic drugs: Secondary | ICD-10-CM | POA: Diagnosis not present

## 2018-03-02 DIAGNOSIS — E119 Type 2 diabetes mellitus without complications: Secondary | ICD-10-CM | POA: Diagnosis not present

## 2018-03-02 DIAGNOSIS — Z6841 Body Mass Index (BMI) 40.0 and over, adult: Secondary | ICD-10-CM | POA: Diagnosis not present

## 2018-03-08 DIAGNOSIS — Z7984 Long term (current) use of oral hypoglycemic drugs: Secondary | ICD-10-CM | POA: Diagnosis not present

## 2018-03-08 DIAGNOSIS — I87322 Chronic venous hypertension (idiopathic) with inflammation of left lower extremity: Secondary | ICD-10-CM | POA: Diagnosis not present

## 2018-03-08 DIAGNOSIS — E119 Type 2 diabetes mellitus without complications: Secondary | ICD-10-CM | POA: Diagnosis not present

## 2018-03-08 DIAGNOSIS — L97222 Non-pressure chronic ulcer of left calf with fat layer exposed: Secondary | ICD-10-CM | POA: Diagnosis not present

## 2018-03-08 DIAGNOSIS — I890 Lymphedema, not elsewhere classified: Secondary | ICD-10-CM | POA: Diagnosis not present

## 2018-03-08 DIAGNOSIS — M170 Bilateral primary osteoarthritis of knee: Secondary | ICD-10-CM | POA: Diagnosis not present

## 2018-03-08 DIAGNOSIS — Z6841 Body Mass Index (BMI) 40.0 and over, adult: Secondary | ICD-10-CM | POA: Diagnosis not present

## 2018-03-10 ENCOUNTER — Encounter (HOSPITAL_BASED_OUTPATIENT_CLINIC_OR_DEPARTMENT_OTHER): Payer: BLUE CROSS/BLUE SHIELD | Attending: Internal Medicine

## 2018-03-10 DIAGNOSIS — L97822 Non-pressure chronic ulcer of other part of left lower leg with fat layer exposed: Secondary | ICD-10-CM | POA: Insufficient documentation

## 2018-03-10 DIAGNOSIS — E11622 Type 2 diabetes mellitus with other skin ulcer: Secondary | ICD-10-CM | POA: Insufficient documentation

## 2018-03-10 DIAGNOSIS — Z6841 Body Mass Index (BMI) 40.0 and over, adult: Secondary | ICD-10-CM | POA: Insufficient documentation

## 2018-03-10 DIAGNOSIS — E669 Obesity, unspecified: Secondary | ICD-10-CM | POA: Insufficient documentation

## 2018-03-10 DIAGNOSIS — I87332 Chronic venous hypertension (idiopathic) with ulcer and inflammation of left lower extremity: Secondary | ICD-10-CM | POA: Insufficient documentation

## 2018-03-10 DIAGNOSIS — I890 Lymphedema, not elsewhere classified: Secondary | ICD-10-CM | POA: Insufficient documentation

## 2018-03-18 DIAGNOSIS — L97222 Non-pressure chronic ulcer of left calf with fat layer exposed: Secondary | ICD-10-CM | POA: Diagnosis not present

## 2018-03-18 DIAGNOSIS — M170 Bilateral primary osteoarthritis of knee: Secondary | ICD-10-CM | POA: Diagnosis not present

## 2018-03-18 DIAGNOSIS — I87322 Chronic venous hypertension (idiopathic) with inflammation of left lower extremity: Secondary | ICD-10-CM | POA: Diagnosis not present

## 2018-03-18 DIAGNOSIS — I890 Lymphedema, not elsewhere classified: Secondary | ICD-10-CM | POA: Diagnosis not present

## 2018-03-18 DIAGNOSIS — Z7984 Long term (current) use of oral hypoglycemic drugs: Secondary | ICD-10-CM | POA: Diagnosis not present

## 2018-03-18 DIAGNOSIS — Z6841 Body Mass Index (BMI) 40.0 and over, adult: Secondary | ICD-10-CM | POA: Diagnosis not present

## 2018-03-18 DIAGNOSIS — E119 Type 2 diabetes mellitus without complications: Secondary | ICD-10-CM | POA: Diagnosis not present

## 2018-03-31 DIAGNOSIS — I890 Lymphedema, not elsewhere classified: Secondary | ICD-10-CM | POA: Diagnosis not present

## 2018-03-31 DIAGNOSIS — Z6841 Body Mass Index (BMI) 40.0 and over, adult: Secondary | ICD-10-CM | POA: Diagnosis not present

## 2018-03-31 DIAGNOSIS — M170 Bilateral primary osteoarthritis of knee: Secondary | ICD-10-CM | POA: Diagnosis not present

## 2018-03-31 DIAGNOSIS — I87312 Chronic venous hypertension (idiopathic) with ulcer of left lower extremity: Secondary | ICD-10-CM | POA: Diagnosis not present

## 2018-03-31 DIAGNOSIS — E119 Type 2 diabetes mellitus without complications: Secondary | ICD-10-CM | POA: Diagnosis not present

## 2018-03-31 DIAGNOSIS — L97221 Non-pressure chronic ulcer of left calf limited to breakdown of skin: Secondary | ICD-10-CM | POA: Diagnosis not present

## 2018-03-31 DIAGNOSIS — Z7984 Long term (current) use of oral hypoglycemic drugs: Secondary | ICD-10-CM | POA: Diagnosis not present

## 2018-03-31 DIAGNOSIS — Z4800 Encounter for change or removal of nonsurgical wound dressing: Secondary | ICD-10-CM | POA: Diagnosis not present

## 2018-03-31 DIAGNOSIS — E6609 Other obesity due to excess calories: Secondary | ICD-10-CM | POA: Diagnosis not present

## 2018-04-12 ENCOUNTER — Encounter (HOSPITAL_BASED_OUTPATIENT_CLINIC_OR_DEPARTMENT_OTHER): Payer: BLUE CROSS/BLUE SHIELD | Attending: Internal Medicine

## 2018-04-12 DIAGNOSIS — L97822 Non-pressure chronic ulcer of other part of left lower leg with fat layer exposed: Secondary | ICD-10-CM | POA: Diagnosis not present

## 2018-04-12 DIAGNOSIS — R600 Localized edema: Secondary | ICD-10-CM | POA: Diagnosis not present

## 2018-04-12 DIAGNOSIS — Z7984 Long term (current) use of oral hypoglycemic drugs: Secondary | ICD-10-CM | POA: Diagnosis not present

## 2018-04-12 DIAGNOSIS — I890 Lymphedema, not elsewhere classified: Secondary | ICD-10-CM | POA: Insufficient documentation

## 2018-04-12 DIAGNOSIS — E11622 Type 2 diabetes mellitus with other skin ulcer: Secondary | ICD-10-CM | POA: Insufficient documentation

## 2018-04-21 DIAGNOSIS — E119 Type 2 diabetes mellitus without complications: Secondary | ICD-10-CM | POA: Diagnosis not present

## 2018-04-21 DIAGNOSIS — M170 Bilateral primary osteoarthritis of knee: Secondary | ICD-10-CM | POA: Diagnosis not present

## 2018-04-21 DIAGNOSIS — I890 Lymphedema, not elsewhere classified: Secondary | ICD-10-CM | POA: Diagnosis not present

## 2018-04-21 DIAGNOSIS — Z7984 Long term (current) use of oral hypoglycemic drugs: Secondary | ICD-10-CM | POA: Diagnosis not present

## 2018-04-21 DIAGNOSIS — Z6841 Body Mass Index (BMI) 40.0 and over, adult: Secondary | ICD-10-CM | POA: Diagnosis not present

## 2018-04-21 DIAGNOSIS — L97221 Non-pressure chronic ulcer of left calf limited to breakdown of skin: Secondary | ICD-10-CM | POA: Diagnosis not present

## 2018-04-21 DIAGNOSIS — E6609 Other obesity due to excess calories: Secondary | ICD-10-CM | POA: Diagnosis not present

## 2018-04-21 DIAGNOSIS — Z4800 Encounter for change or removal of nonsurgical wound dressing: Secondary | ICD-10-CM | POA: Diagnosis not present

## 2018-04-21 DIAGNOSIS — I87312 Chronic venous hypertension (idiopathic) with ulcer of left lower extremity: Secondary | ICD-10-CM | POA: Diagnosis not present

## 2018-04-26 DIAGNOSIS — M1711 Unilateral primary osteoarthritis, right knee: Secondary | ICD-10-CM | POA: Diagnosis not present

## 2018-04-26 DIAGNOSIS — M1712 Unilateral primary osteoarthritis, left knee: Secondary | ICD-10-CM | POA: Diagnosis not present

## 2018-04-27 DIAGNOSIS — Z4800 Encounter for change or removal of nonsurgical wound dressing: Secondary | ICD-10-CM | POA: Diagnosis not present

## 2018-04-27 DIAGNOSIS — M170 Bilateral primary osteoarthritis of knee: Secondary | ICD-10-CM | POA: Diagnosis not present

## 2018-04-27 DIAGNOSIS — Z6841 Body Mass Index (BMI) 40.0 and over, adult: Secondary | ICD-10-CM | POA: Diagnosis not present

## 2018-04-27 DIAGNOSIS — E6609 Other obesity due to excess calories: Secondary | ICD-10-CM | POA: Diagnosis not present

## 2018-04-27 DIAGNOSIS — I87312 Chronic venous hypertension (idiopathic) with ulcer of left lower extremity: Secondary | ICD-10-CM | POA: Diagnosis not present

## 2018-04-27 DIAGNOSIS — E119 Type 2 diabetes mellitus without complications: Secondary | ICD-10-CM | POA: Diagnosis not present

## 2018-04-27 DIAGNOSIS — L97221 Non-pressure chronic ulcer of left calf limited to breakdown of skin: Secondary | ICD-10-CM | POA: Diagnosis not present

## 2018-04-27 DIAGNOSIS — I890 Lymphedema, not elsewhere classified: Secondary | ICD-10-CM | POA: Diagnosis not present

## 2018-04-27 DIAGNOSIS — Z7984 Long term (current) use of oral hypoglycemic drugs: Secondary | ICD-10-CM | POA: Diagnosis not present

## 2018-05-03 ENCOUNTER — Encounter (HOSPITAL_BASED_OUTPATIENT_CLINIC_OR_DEPARTMENT_OTHER): Payer: BLUE CROSS/BLUE SHIELD | Attending: Internal Medicine

## 2018-05-03 ENCOUNTER — Encounter (HOSPITAL_BASED_OUTPATIENT_CLINIC_OR_DEPARTMENT_OTHER): Payer: Self-pay

## 2018-05-03 DIAGNOSIS — Z6841 Body Mass Index (BMI) 40.0 and over, adult: Secondary | ICD-10-CM | POA: Diagnosis not present

## 2018-05-03 DIAGNOSIS — I890 Lymphedema, not elsewhere classified: Secondary | ICD-10-CM | POA: Insufficient documentation

## 2018-05-03 DIAGNOSIS — E11622 Type 2 diabetes mellitus with other skin ulcer: Secondary | ICD-10-CM | POA: Insufficient documentation

## 2018-05-03 DIAGNOSIS — I87332 Chronic venous hypertension (idiopathic) with ulcer and inflammation of left lower extremity: Secondary | ICD-10-CM | POA: Insufficient documentation

## 2018-05-03 DIAGNOSIS — L97822 Non-pressure chronic ulcer of other part of left lower leg with fat layer exposed: Secondary | ICD-10-CM | POA: Diagnosis not present

## 2018-05-03 DIAGNOSIS — E668 Other obesity: Secondary | ICD-10-CM | POA: Insufficient documentation

## 2018-05-03 DIAGNOSIS — M25562 Pain in left knee: Secondary | ICD-10-CM | POA: Diagnosis not present

## 2018-05-05 DIAGNOSIS — I87312 Chronic venous hypertension (idiopathic) with ulcer of left lower extremity: Secondary | ICD-10-CM | POA: Diagnosis not present

## 2018-05-05 DIAGNOSIS — M170 Bilateral primary osteoarthritis of knee: Secondary | ICD-10-CM | POA: Diagnosis not present

## 2018-05-05 DIAGNOSIS — E6609 Other obesity due to excess calories: Secondary | ICD-10-CM | POA: Diagnosis not present

## 2018-05-05 DIAGNOSIS — E119 Type 2 diabetes mellitus without complications: Secondary | ICD-10-CM | POA: Diagnosis not present

## 2018-05-05 DIAGNOSIS — Z7984 Long term (current) use of oral hypoglycemic drugs: Secondary | ICD-10-CM | POA: Diagnosis not present

## 2018-05-05 DIAGNOSIS — Z6841 Body Mass Index (BMI) 40.0 and over, adult: Secondary | ICD-10-CM | POA: Diagnosis not present

## 2018-05-05 DIAGNOSIS — Z4800 Encounter for change or removal of nonsurgical wound dressing: Secondary | ICD-10-CM | POA: Diagnosis not present

## 2018-05-05 DIAGNOSIS — L97221 Non-pressure chronic ulcer of left calf limited to breakdown of skin: Secondary | ICD-10-CM | POA: Diagnosis not present

## 2018-05-05 DIAGNOSIS — I890 Lymphedema, not elsewhere classified: Secondary | ICD-10-CM | POA: Diagnosis not present

## 2018-05-12 DIAGNOSIS — I87312 Chronic venous hypertension (idiopathic) with ulcer of left lower extremity: Secondary | ICD-10-CM | POA: Diagnosis not present

## 2018-05-12 DIAGNOSIS — L97221 Non-pressure chronic ulcer of left calf limited to breakdown of skin: Secondary | ICD-10-CM | POA: Diagnosis not present

## 2018-05-12 DIAGNOSIS — Z4800 Encounter for change or removal of nonsurgical wound dressing: Secondary | ICD-10-CM | POA: Diagnosis not present

## 2018-05-12 DIAGNOSIS — Z6841 Body Mass Index (BMI) 40.0 and over, adult: Secondary | ICD-10-CM | POA: Diagnosis not present

## 2018-05-12 DIAGNOSIS — E119 Type 2 diabetes mellitus without complications: Secondary | ICD-10-CM | POA: Diagnosis not present

## 2018-05-12 DIAGNOSIS — Z7984 Long term (current) use of oral hypoglycemic drugs: Secondary | ICD-10-CM | POA: Diagnosis not present

## 2018-05-12 DIAGNOSIS — M170 Bilateral primary osteoarthritis of knee: Secondary | ICD-10-CM | POA: Diagnosis not present

## 2018-05-12 DIAGNOSIS — I890 Lymphedema, not elsewhere classified: Secondary | ICD-10-CM | POA: Diagnosis not present

## 2018-05-12 DIAGNOSIS — E6609 Other obesity due to excess calories: Secondary | ICD-10-CM | POA: Diagnosis not present

## 2018-05-17 DIAGNOSIS — I87312 Chronic venous hypertension (idiopathic) with ulcer of left lower extremity: Secondary | ICD-10-CM | POA: Diagnosis not present

## 2018-05-17 DIAGNOSIS — E6609 Other obesity due to excess calories: Secondary | ICD-10-CM | POA: Diagnosis not present

## 2018-05-17 DIAGNOSIS — Z6841 Body Mass Index (BMI) 40.0 and over, adult: Secondary | ICD-10-CM | POA: Diagnosis not present

## 2018-05-17 DIAGNOSIS — E119 Type 2 diabetes mellitus without complications: Secondary | ICD-10-CM | POA: Diagnosis not present

## 2018-05-17 DIAGNOSIS — Z4800 Encounter for change or removal of nonsurgical wound dressing: Secondary | ICD-10-CM | POA: Diagnosis not present

## 2018-05-17 DIAGNOSIS — L97221 Non-pressure chronic ulcer of left calf limited to breakdown of skin: Secondary | ICD-10-CM | POA: Diagnosis not present

## 2018-05-17 DIAGNOSIS — M170 Bilateral primary osteoarthritis of knee: Secondary | ICD-10-CM | POA: Diagnosis not present

## 2018-05-17 DIAGNOSIS — Z7984 Long term (current) use of oral hypoglycemic drugs: Secondary | ICD-10-CM | POA: Diagnosis not present

## 2018-05-17 DIAGNOSIS — I890 Lymphedema, not elsewhere classified: Secondary | ICD-10-CM | POA: Diagnosis not present

## 2018-05-24 DIAGNOSIS — I890 Lymphedema, not elsewhere classified: Secondary | ICD-10-CM | POA: Diagnosis not present

## 2018-05-24 DIAGNOSIS — L97221 Non-pressure chronic ulcer of left calf limited to breakdown of skin: Secondary | ICD-10-CM | POA: Diagnosis not present

## 2018-05-24 DIAGNOSIS — Z6841 Body Mass Index (BMI) 40.0 and over, adult: Secondary | ICD-10-CM | POA: Diagnosis not present

## 2018-05-24 DIAGNOSIS — Z4800 Encounter for change or removal of nonsurgical wound dressing: Secondary | ICD-10-CM | POA: Diagnosis not present

## 2018-05-24 DIAGNOSIS — I87312 Chronic venous hypertension (idiopathic) with ulcer of left lower extremity: Secondary | ICD-10-CM | POA: Diagnosis not present

## 2018-05-24 DIAGNOSIS — Z7984 Long term (current) use of oral hypoglycemic drugs: Secondary | ICD-10-CM | POA: Diagnosis not present

## 2018-05-24 DIAGNOSIS — E119 Type 2 diabetes mellitus without complications: Secondary | ICD-10-CM | POA: Diagnosis not present

## 2018-05-24 DIAGNOSIS — E6609 Other obesity due to excess calories: Secondary | ICD-10-CM | POA: Diagnosis not present

## 2018-05-24 DIAGNOSIS — M170 Bilateral primary osteoarthritis of knee: Secondary | ICD-10-CM | POA: Diagnosis not present

## 2018-05-26 ENCOUNTER — Other Ambulatory Visit (HOSPITAL_BASED_OUTPATIENT_CLINIC_OR_DEPARTMENT_OTHER): Payer: Self-pay | Admitting: Internal Medicine

## 2018-05-26 ENCOUNTER — Encounter (HOSPITAL_BASED_OUTPATIENT_CLINIC_OR_DEPARTMENT_OTHER): Payer: BLUE CROSS/BLUE SHIELD | Attending: Internal Medicine

## 2018-05-26 DIAGNOSIS — E11622 Type 2 diabetes mellitus with other skin ulcer: Secondary | ICD-10-CM | POA: Insufficient documentation

## 2018-05-26 DIAGNOSIS — E669 Obesity, unspecified: Secondary | ICD-10-CM | POA: Insufficient documentation

## 2018-05-26 DIAGNOSIS — I890 Lymphedema, not elsewhere classified: Secondary | ICD-10-CM | POA: Diagnosis not present

## 2018-05-26 DIAGNOSIS — L97822 Non-pressure chronic ulcer of other part of left lower leg with fat layer exposed: Secondary | ICD-10-CM | POA: Insufficient documentation

## 2018-05-26 DIAGNOSIS — Z6841 Body Mass Index (BMI) 40.0 and over, adult: Secondary | ICD-10-CM | POA: Diagnosis not present

## 2018-05-26 DIAGNOSIS — L97829 Non-pressure chronic ulcer of other part of left lower leg with unspecified severity: Secondary | ICD-10-CM | POA: Diagnosis not present

## 2018-05-26 DIAGNOSIS — I87322 Chronic venous hypertension (idiopathic) with inflammation of left lower extremity: Secondary | ICD-10-CM | POA: Insufficient documentation

## 2018-05-30 DIAGNOSIS — I890 Lymphedema, not elsewhere classified: Secondary | ICD-10-CM | POA: Diagnosis not present

## 2018-05-30 DIAGNOSIS — Z7984 Long term (current) use of oral hypoglycemic drugs: Secondary | ICD-10-CM | POA: Diagnosis not present

## 2018-05-30 DIAGNOSIS — L97221 Non-pressure chronic ulcer of left calf limited to breakdown of skin: Secondary | ICD-10-CM | POA: Diagnosis not present

## 2018-05-30 DIAGNOSIS — I87312 Chronic venous hypertension (idiopathic) with ulcer of left lower extremity: Secondary | ICD-10-CM | POA: Diagnosis not present

## 2018-05-30 DIAGNOSIS — Z4800 Encounter for change or removal of nonsurgical wound dressing: Secondary | ICD-10-CM | POA: Diagnosis not present

## 2018-05-30 DIAGNOSIS — Z6841 Body Mass Index (BMI) 40.0 and over, adult: Secondary | ICD-10-CM | POA: Diagnosis not present

## 2018-05-30 DIAGNOSIS — E6609 Other obesity due to excess calories: Secondary | ICD-10-CM | POA: Diagnosis not present

## 2018-05-30 DIAGNOSIS — E119 Type 2 diabetes mellitus without complications: Secondary | ICD-10-CM | POA: Diagnosis not present

## 2018-05-30 DIAGNOSIS — M170 Bilateral primary osteoarthritis of knee: Secondary | ICD-10-CM | POA: Diagnosis not present

## 2018-06-09 ENCOUNTER — Other Ambulatory Visit (HOSPITAL_COMMUNITY)
Admission: RE | Admit: 2018-06-09 | Discharge: 2018-06-09 | Disposition: A | Discharge status: Home / Self Care | Payer: BLUE CROSS/BLUE SHIELD | Source: Other Acute Inpatient Hospital | Attending: Internal Medicine | Admitting: Internal Medicine

## 2018-06-09 DIAGNOSIS — L97221 Non-pressure chronic ulcer of left calf limited to breakdown of skin: Secondary | ICD-10-CM | POA: Insufficient documentation

## 2018-06-09 DIAGNOSIS — Z6841 Body Mass Index (BMI) 40.0 and over, adult: Secondary | ICD-10-CM | POA: Diagnosis not present

## 2018-06-09 DIAGNOSIS — I890 Lymphedema, not elsewhere classified: Secondary | ICD-10-CM | POA: Diagnosis not present

## 2018-06-09 DIAGNOSIS — E669 Obesity, unspecified: Secondary | ICD-10-CM | POA: Diagnosis not present

## 2018-06-09 DIAGNOSIS — L97822 Non-pressure chronic ulcer of other part of left lower leg with fat layer exposed: Secondary | ICD-10-CM | POA: Diagnosis not present

## 2018-06-09 DIAGNOSIS — E11622 Type 2 diabetes mellitus with other skin ulcer: Secondary | ICD-10-CM | POA: Diagnosis not present

## 2018-06-09 DIAGNOSIS — L89899 Pressure ulcer of other site, unspecified stage: Secondary | ICD-10-CM | POA: Diagnosis not present

## 2018-06-09 DIAGNOSIS — I87322 Chronic venous hypertension (idiopathic) with inflammation of left lower extremity: Secondary | ICD-10-CM | POA: Diagnosis not present

## 2018-06-10 DIAGNOSIS — Z6841 Body Mass Index (BMI) 40.0 and over, adult: Secondary | ICD-10-CM | POA: Diagnosis not present

## 2018-06-10 DIAGNOSIS — L97221 Non-pressure chronic ulcer of left calf limited to breakdown of skin: Secondary | ICD-10-CM | POA: Diagnosis not present

## 2018-06-10 DIAGNOSIS — E119 Type 2 diabetes mellitus without complications: Secondary | ICD-10-CM | POA: Diagnosis not present

## 2018-06-10 DIAGNOSIS — I890 Lymphedema, not elsewhere classified: Secondary | ICD-10-CM | POA: Diagnosis not present

## 2018-06-10 DIAGNOSIS — E6609 Other obesity due to excess calories: Secondary | ICD-10-CM | POA: Diagnosis not present

## 2018-06-10 DIAGNOSIS — I87312 Chronic venous hypertension (idiopathic) with ulcer of left lower extremity: Secondary | ICD-10-CM | POA: Diagnosis not present

## 2018-06-14 DIAGNOSIS — L97221 Non-pressure chronic ulcer of left calf limited to breakdown of skin: Secondary | ICD-10-CM | POA: Diagnosis not present

## 2018-06-14 DIAGNOSIS — I890 Lymphedema, not elsewhere classified: Secondary | ICD-10-CM | POA: Diagnosis not present

## 2018-06-14 DIAGNOSIS — E119 Type 2 diabetes mellitus without complications: Secondary | ICD-10-CM | POA: Diagnosis not present

## 2018-06-14 DIAGNOSIS — I87312 Chronic venous hypertension (idiopathic) with ulcer of left lower extremity: Secondary | ICD-10-CM | POA: Diagnosis not present

## 2018-06-14 DIAGNOSIS — Z6841 Body Mass Index (BMI) 40.0 and over, adult: Secondary | ICD-10-CM | POA: Diagnosis not present

## 2018-06-14 DIAGNOSIS — E6609 Other obesity due to excess calories: Secondary | ICD-10-CM | POA: Diagnosis not present

## 2018-06-16 DIAGNOSIS — I890 Lymphedema, not elsewhere classified: Secondary | ICD-10-CM | POA: Diagnosis not present

## 2018-06-16 DIAGNOSIS — E119 Type 2 diabetes mellitus without complications: Secondary | ICD-10-CM | POA: Diagnosis not present

## 2018-06-16 DIAGNOSIS — L97221 Non-pressure chronic ulcer of left calf limited to breakdown of skin: Secondary | ICD-10-CM | POA: Diagnosis not present

## 2018-06-16 DIAGNOSIS — I87312 Chronic venous hypertension (idiopathic) with ulcer of left lower extremity: Secondary | ICD-10-CM | POA: Diagnosis not present

## 2018-06-16 LAB — AEROBIC/ANAEROBIC CULTURE (SURGICAL/DEEP WOUND): CULTURE: NORMAL

## 2018-06-16 LAB — AEROBIC/ANAEROBIC CULTURE W GRAM STAIN (SURGICAL/DEEP WOUND)

## 2018-06-21 DIAGNOSIS — I87312 Chronic venous hypertension (idiopathic) with ulcer of left lower extremity: Secondary | ICD-10-CM | POA: Diagnosis not present

## 2018-06-21 DIAGNOSIS — E119 Type 2 diabetes mellitus without complications: Secondary | ICD-10-CM | POA: Diagnosis not present

## 2018-06-21 DIAGNOSIS — L97221 Non-pressure chronic ulcer of left calf limited to breakdown of skin: Secondary | ICD-10-CM | POA: Diagnosis not present

## 2018-06-21 DIAGNOSIS — E6609 Other obesity due to excess calories: Secondary | ICD-10-CM | POA: Diagnosis not present

## 2018-06-21 DIAGNOSIS — Z6841 Body Mass Index (BMI) 40.0 and over, adult: Secondary | ICD-10-CM | POA: Diagnosis not present

## 2018-06-21 DIAGNOSIS — I890 Lymphedema, not elsewhere classified: Secondary | ICD-10-CM | POA: Diagnosis not present

## 2018-06-30 ENCOUNTER — Encounter (HOSPITAL_BASED_OUTPATIENT_CLINIC_OR_DEPARTMENT_OTHER): Payer: BLUE CROSS/BLUE SHIELD | Attending: Internal Medicine

## 2018-06-30 DIAGNOSIS — I890 Lymphedema, not elsewhere classified: Secondary | ICD-10-CM | POA: Insufficient documentation

## 2018-06-30 DIAGNOSIS — E11622 Type 2 diabetes mellitus with other skin ulcer: Secondary | ICD-10-CM | POA: Insufficient documentation

## 2018-06-30 DIAGNOSIS — E6609 Other obesity due to excess calories: Secondary | ICD-10-CM | POA: Diagnosis not present

## 2018-06-30 DIAGNOSIS — Z6841 Body Mass Index (BMI) 40.0 and over, adult: Secondary | ICD-10-CM | POA: Insufficient documentation

## 2018-06-30 DIAGNOSIS — L89899 Pressure ulcer of other site, unspecified stage: Secondary | ICD-10-CM | POA: Diagnosis not present

## 2018-06-30 DIAGNOSIS — L97822 Non-pressure chronic ulcer of other part of left lower leg with fat layer exposed: Secondary | ICD-10-CM | POA: Diagnosis not present

## 2018-07-05 DIAGNOSIS — L97221 Non-pressure chronic ulcer of left calf limited to breakdown of skin: Secondary | ICD-10-CM | POA: Diagnosis not present

## 2018-07-05 DIAGNOSIS — Z6841 Body Mass Index (BMI) 40.0 and over, adult: Secondary | ICD-10-CM | POA: Diagnosis not present

## 2018-07-05 DIAGNOSIS — I890 Lymphedema, not elsewhere classified: Secondary | ICD-10-CM | POA: Diagnosis not present

## 2018-07-05 DIAGNOSIS — I87312 Chronic venous hypertension (idiopathic) with ulcer of left lower extremity: Secondary | ICD-10-CM | POA: Diagnosis not present

## 2018-07-05 DIAGNOSIS — E1151 Type 2 diabetes mellitus with diabetic peripheral angiopathy without gangrene: Secondary | ICD-10-CM | POA: Diagnosis not present

## 2018-07-05 DIAGNOSIS — Z4800 Encounter for change or removal of nonsurgical wound dressing: Secondary | ICD-10-CM | POA: Diagnosis not present

## 2018-07-12 DIAGNOSIS — L97221 Non-pressure chronic ulcer of left calf limited to breakdown of skin: Secondary | ICD-10-CM | POA: Diagnosis not present

## 2018-07-12 DIAGNOSIS — Z4800 Encounter for change or removal of nonsurgical wound dressing: Secondary | ICD-10-CM | POA: Diagnosis not present

## 2018-07-12 DIAGNOSIS — I890 Lymphedema, not elsewhere classified: Secondary | ICD-10-CM | POA: Diagnosis not present

## 2018-07-12 DIAGNOSIS — Z6841 Body Mass Index (BMI) 40.0 and over, adult: Secondary | ICD-10-CM | POA: Diagnosis not present

## 2018-07-12 DIAGNOSIS — I87312 Chronic venous hypertension (idiopathic) with ulcer of left lower extremity: Secondary | ICD-10-CM | POA: Diagnosis not present

## 2018-07-12 DIAGNOSIS — E1151 Type 2 diabetes mellitus with diabetic peripheral angiopathy without gangrene: Secondary | ICD-10-CM | POA: Diagnosis not present

## 2018-07-19 DIAGNOSIS — I87312 Chronic venous hypertension (idiopathic) with ulcer of left lower extremity: Secondary | ICD-10-CM | POA: Diagnosis not present

## 2018-07-19 DIAGNOSIS — I890 Lymphedema, not elsewhere classified: Secondary | ICD-10-CM | POA: Diagnosis not present

## 2018-07-19 DIAGNOSIS — Z4800 Encounter for change or removal of nonsurgical wound dressing: Secondary | ICD-10-CM | POA: Diagnosis not present

## 2018-07-19 DIAGNOSIS — E1151 Type 2 diabetes mellitus with diabetic peripheral angiopathy without gangrene: Secondary | ICD-10-CM | POA: Diagnosis not present

## 2018-07-19 DIAGNOSIS — L97221 Non-pressure chronic ulcer of left calf limited to breakdown of skin: Secondary | ICD-10-CM | POA: Diagnosis not present

## 2018-07-19 DIAGNOSIS — Z6841 Body Mass Index (BMI) 40.0 and over, adult: Secondary | ICD-10-CM | POA: Diagnosis not present

## 2018-07-21 DIAGNOSIS — I890 Lymphedema, not elsewhere classified: Secondary | ICD-10-CM | POA: Diagnosis not present

## 2018-07-21 DIAGNOSIS — Z6841 Body Mass Index (BMI) 40.0 and over, adult: Secondary | ICD-10-CM | POA: Diagnosis not present

## 2018-07-21 DIAGNOSIS — E6609 Other obesity due to excess calories: Secondary | ICD-10-CM | POA: Diagnosis not present

## 2018-07-21 DIAGNOSIS — E11622 Type 2 diabetes mellitus with other skin ulcer: Secondary | ICD-10-CM | POA: Diagnosis not present

## 2018-07-21 DIAGNOSIS — L97822 Non-pressure chronic ulcer of other part of left lower leg with fat layer exposed: Secondary | ICD-10-CM | POA: Diagnosis not present

## 2018-07-26 DIAGNOSIS — Z4800 Encounter for change or removal of nonsurgical wound dressing: Secondary | ICD-10-CM | POA: Diagnosis not present

## 2018-07-26 DIAGNOSIS — I87312 Chronic venous hypertension (idiopathic) with ulcer of left lower extremity: Secondary | ICD-10-CM | POA: Diagnosis not present

## 2018-07-26 DIAGNOSIS — Z6841 Body Mass Index (BMI) 40.0 and over, adult: Secondary | ICD-10-CM | POA: Diagnosis not present

## 2018-07-26 DIAGNOSIS — E1151 Type 2 diabetes mellitus with diabetic peripheral angiopathy without gangrene: Secondary | ICD-10-CM | POA: Diagnosis not present

## 2018-07-26 DIAGNOSIS — L97221 Non-pressure chronic ulcer of left calf limited to breakdown of skin: Secondary | ICD-10-CM | POA: Diagnosis not present

## 2018-07-26 DIAGNOSIS — I890 Lymphedema, not elsewhere classified: Secondary | ICD-10-CM | POA: Diagnosis not present

## 2018-08-02 DIAGNOSIS — E1151 Type 2 diabetes mellitus with diabetic peripheral angiopathy without gangrene: Secondary | ICD-10-CM | POA: Diagnosis not present

## 2018-08-02 DIAGNOSIS — I87312 Chronic venous hypertension (idiopathic) with ulcer of left lower extremity: Secondary | ICD-10-CM | POA: Diagnosis not present

## 2018-08-02 DIAGNOSIS — Z6841 Body Mass Index (BMI) 40.0 and over, adult: Secondary | ICD-10-CM | POA: Diagnosis not present

## 2018-08-02 DIAGNOSIS — I890 Lymphedema, not elsewhere classified: Secondary | ICD-10-CM | POA: Diagnosis not present

## 2018-08-02 DIAGNOSIS — L97221 Non-pressure chronic ulcer of left calf limited to breakdown of skin: Secondary | ICD-10-CM | POA: Diagnosis not present

## 2018-08-02 DIAGNOSIS — Z4800 Encounter for change or removal of nonsurgical wound dressing: Secondary | ICD-10-CM | POA: Diagnosis not present

## 2018-08-11 DIAGNOSIS — Z4800 Encounter for change or removal of nonsurgical wound dressing: Secondary | ICD-10-CM | POA: Diagnosis not present

## 2018-08-11 DIAGNOSIS — I890 Lymphedema, not elsewhere classified: Secondary | ICD-10-CM | POA: Diagnosis not present

## 2018-08-11 DIAGNOSIS — L97221 Non-pressure chronic ulcer of left calf limited to breakdown of skin: Secondary | ICD-10-CM | POA: Diagnosis not present

## 2018-08-11 DIAGNOSIS — I87312 Chronic venous hypertension (idiopathic) with ulcer of left lower extremity: Secondary | ICD-10-CM | POA: Diagnosis not present

## 2018-08-11 DIAGNOSIS — Z6841 Body Mass Index (BMI) 40.0 and over, adult: Secondary | ICD-10-CM | POA: Diagnosis not present

## 2018-08-11 DIAGNOSIS — E1151 Type 2 diabetes mellitus with diabetic peripheral angiopathy without gangrene: Secondary | ICD-10-CM | POA: Diagnosis not present

## 2018-08-16 DIAGNOSIS — Z4800 Encounter for change or removal of nonsurgical wound dressing: Secondary | ICD-10-CM | POA: Diagnosis not present

## 2018-08-16 DIAGNOSIS — I890 Lymphedema, not elsewhere classified: Secondary | ICD-10-CM | POA: Diagnosis not present

## 2018-08-16 DIAGNOSIS — Z6841 Body Mass Index (BMI) 40.0 and over, adult: Secondary | ICD-10-CM | POA: Diagnosis not present

## 2018-08-16 DIAGNOSIS — L97221 Non-pressure chronic ulcer of left calf limited to breakdown of skin: Secondary | ICD-10-CM | POA: Diagnosis not present

## 2018-08-16 DIAGNOSIS — I87312 Chronic venous hypertension (idiopathic) with ulcer of left lower extremity: Secondary | ICD-10-CM | POA: Diagnosis not present

## 2018-08-16 DIAGNOSIS — E1151 Type 2 diabetes mellitus with diabetic peripheral angiopathy without gangrene: Secondary | ICD-10-CM | POA: Diagnosis not present

## 2018-08-18 ENCOUNTER — Encounter (HOSPITAL_BASED_OUTPATIENT_CLINIC_OR_DEPARTMENT_OTHER): Payer: BLUE CROSS/BLUE SHIELD | Attending: Internal Medicine

## 2018-08-22 DIAGNOSIS — I890 Lymphedema, not elsewhere classified: Secondary | ICD-10-CM | POA: Diagnosis not present

## 2018-08-22 DIAGNOSIS — I87312 Chronic venous hypertension (idiopathic) with ulcer of left lower extremity: Secondary | ICD-10-CM | POA: Diagnosis not present

## 2018-08-22 DIAGNOSIS — Z6841 Body Mass Index (BMI) 40.0 and over, adult: Secondary | ICD-10-CM | POA: Diagnosis not present

## 2018-08-22 DIAGNOSIS — Z4800 Encounter for change or removal of nonsurgical wound dressing: Secondary | ICD-10-CM | POA: Diagnosis not present

## 2018-08-22 DIAGNOSIS — L97221 Non-pressure chronic ulcer of left calf limited to breakdown of skin: Secondary | ICD-10-CM | POA: Diagnosis not present

## 2018-08-22 DIAGNOSIS — E1151 Type 2 diabetes mellitus with diabetic peripheral angiopathy without gangrene: Secondary | ICD-10-CM | POA: Diagnosis not present

## 2018-08-25 ENCOUNTER — Encounter (HOSPITAL_BASED_OUTPATIENT_CLINIC_OR_DEPARTMENT_OTHER): Payer: Self-pay

## 2018-08-25 ENCOUNTER — Encounter (HOSPITAL_BASED_OUTPATIENT_CLINIC_OR_DEPARTMENT_OTHER): Payer: BLUE CROSS/BLUE SHIELD | Attending: Internal Medicine

## 2018-08-25 DIAGNOSIS — I87332 Chronic venous hypertension (idiopathic) with ulcer and inflammation of left lower extremity: Secondary | ICD-10-CM | POA: Diagnosis not present

## 2018-08-25 DIAGNOSIS — I890 Lymphedema, not elsewhere classified: Secondary | ICD-10-CM | POA: Diagnosis not present

## 2018-08-25 DIAGNOSIS — E11622 Type 2 diabetes mellitus with other skin ulcer: Secondary | ICD-10-CM | POA: Diagnosis not present

## 2018-08-25 DIAGNOSIS — L97822 Non-pressure chronic ulcer of other part of left lower leg with fat layer exposed: Secondary | ICD-10-CM | POA: Diagnosis not present

## 2018-08-25 DIAGNOSIS — Z6841 Body Mass Index (BMI) 40.0 and over, adult: Secondary | ICD-10-CM | POA: Insufficient documentation

## 2018-08-25 DIAGNOSIS — E669 Obesity, unspecified: Secondary | ICD-10-CM | POA: Insufficient documentation

## 2018-09-01 DIAGNOSIS — I87312 Chronic venous hypertension (idiopathic) with ulcer of left lower extremity: Secondary | ICD-10-CM | POA: Diagnosis not present

## 2018-09-01 DIAGNOSIS — L97221 Non-pressure chronic ulcer of left calf limited to breakdown of skin: Secondary | ICD-10-CM | POA: Diagnosis not present

## 2018-09-01 DIAGNOSIS — I890 Lymphedema, not elsewhere classified: Secondary | ICD-10-CM | POA: Diagnosis not present

## 2018-09-01 DIAGNOSIS — Z4800 Encounter for change or removal of nonsurgical wound dressing: Secondary | ICD-10-CM | POA: Diagnosis not present

## 2018-09-01 DIAGNOSIS — E1151 Type 2 diabetes mellitus with diabetic peripheral angiopathy without gangrene: Secondary | ICD-10-CM | POA: Diagnosis not present

## 2018-09-01 DIAGNOSIS — Z6841 Body Mass Index (BMI) 40.0 and over, adult: Secondary | ICD-10-CM | POA: Diagnosis not present

## 2018-09-06 DIAGNOSIS — L97221 Non-pressure chronic ulcer of left calf limited to breakdown of skin: Secondary | ICD-10-CM | POA: Diagnosis not present

## 2018-09-06 DIAGNOSIS — I890 Lymphedema, not elsewhere classified: Secondary | ICD-10-CM | POA: Diagnosis not present

## 2018-09-06 DIAGNOSIS — I87312 Chronic venous hypertension (idiopathic) with ulcer of left lower extremity: Secondary | ICD-10-CM | POA: Diagnosis not present

## 2018-09-06 DIAGNOSIS — E1151 Type 2 diabetes mellitus with diabetic peripheral angiopathy without gangrene: Secondary | ICD-10-CM | POA: Diagnosis not present

## 2018-09-06 DIAGNOSIS — Z6841 Body Mass Index (BMI) 40.0 and over, adult: Secondary | ICD-10-CM | POA: Diagnosis not present

## 2018-09-15 DIAGNOSIS — Z6841 Body Mass Index (BMI) 40.0 and over, adult: Secondary | ICD-10-CM | POA: Diagnosis not present

## 2018-09-15 DIAGNOSIS — I87312 Chronic venous hypertension (idiopathic) with ulcer of left lower extremity: Secondary | ICD-10-CM | POA: Diagnosis not present

## 2018-09-15 DIAGNOSIS — I890 Lymphedema, not elsewhere classified: Secondary | ICD-10-CM | POA: Diagnosis not present

## 2018-09-15 DIAGNOSIS — L97221 Non-pressure chronic ulcer of left calf limited to breakdown of skin: Secondary | ICD-10-CM | POA: Diagnosis not present

## 2018-09-15 DIAGNOSIS — E1151 Type 2 diabetes mellitus with diabetic peripheral angiopathy without gangrene: Secondary | ICD-10-CM | POA: Diagnosis not present

## 2018-09-20 DIAGNOSIS — I87312 Chronic venous hypertension (idiopathic) with ulcer of left lower extremity: Secondary | ICD-10-CM | POA: Diagnosis not present

## 2018-09-20 DIAGNOSIS — L97221 Non-pressure chronic ulcer of left calf limited to breakdown of skin: Secondary | ICD-10-CM | POA: Diagnosis not present

## 2018-09-20 DIAGNOSIS — Z6841 Body Mass Index (BMI) 40.0 and over, adult: Secondary | ICD-10-CM | POA: Diagnosis not present

## 2018-09-20 DIAGNOSIS — I890 Lymphedema, not elsewhere classified: Secondary | ICD-10-CM | POA: Diagnosis not present

## 2018-09-20 DIAGNOSIS — E1151 Type 2 diabetes mellitus with diabetic peripheral angiopathy without gangrene: Secondary | ICD-10-CM | POA: Diagnosis not present

## 2018-09-22 ENCOUNTER — Encounter (HOSPITAL_BASED_OUTPATIENT_CLINIC_OR_DEPARTMENT_OTHER): Payer: BLUE CROSS/BLUE SHIELD | Attending: Internal Medicine

## 2018-09-22 ENCOUNTER — Other Ambulatory Visit: Payer: Self-pay

## 2018-09-22 DIAGNOSIS — Z6841 Body Mass Index (BMI) 40.0 and over, adult: Secondary | ICD-10-CM | POA: Diagnosis not present

## 2018-09-22 DIAGNOSIS — L97222 Non-pressure chronic ulcer of left calf with fat layer exposed: Secondary | ICD-10-CM | POA: Insufficient documentation

## 2018-09-22 DIAGNOSIS — I890 Lymphedema, not elsewhere classified: Secondary | ICD-10-CM | POA: Insufficient documentation

## 2018-09-22 DIAGNOSIS — Z7984 Long term (current) use of oral hypoglycemic drugs: Secondary | ICD-10-CM | POA: Insufficient documentation

## 2018-09-22 DIAGNOSIS — E11622 Type 2 diabetes mellitus with other skin ulcer: Secondary | ICD-10-CM | POA: Insufficient documentation

## 2018-09-22 DIAGNOSIS — L97822 Non-pressure chronic ulcer of other part of left lower leg with fat layer exposed: Secondary | ICD-10-CM | POA: Diagnosis not present

## 2018-09-22 DIAGNOSIS — E6609 Other obesity due to excess calories: Secondary | ICD-10-CM | POA: Diagnosis not present

## 2018-09-27 DIAGNOSIS — I87312 Chronic venous hypertension (idiopathic) with ulcer of left lower extremity: Secondary | ICD-10-CM | POA: Diagnosis not present

## 2018-09-27 DIAGNOSIS — Z6841 Body Mass Index (BMI) 40.0 and over, adult: Secondary | ICD-10-CM | POA: Diagnosis not present

## 2018-09-27 DIAGNOSIS — I890 Lymphedema, not elsewhere classified: Secondary | ICD-10-CM | POA: Diagnosis not present

## 2018-09-27 DIAGNOSIS — L97221 Non-pressure chronic ulcer of left calf limited to breakdown of skin: Secondary | ICD-10-CM | POA: Diagnosis not present

## 2018-09-27 DIAGNOSIS — E1151 Type 2 diabetes mellitus with diabetic peripheral angiopathy without gangrene: Secondary | ICD-10-CM | POA: Diagnosis not present

## 2018-10-04 DIAGNOSIS — Z6841 Body Mass Index (BMI) 40.0 and over, adult: Secondary | ICD-10-CM | POA: Diagnosis not present

## 2018-10-04 DIAGNOSIS — I87312 Chronic venous hypertension (idiopathic) with ulcer of left lower extremity: Secondary | ICD-10-CM | POA: Diagnosis not present

## 2018-10-04 DIAGNOSIS — I890 Lymphedema, not elsewhere classified: Secondary | ICD-10-CM | POA: Diagnosis not present

## 2018-10-04 DIAGNOSIS — L97221 Non-pressure chronic ulcer of left calf limited to breakdown of skin: Secondary | ICD-10-CM | POA: Diagnosis not present

## 2018-10-04 DIAGNOSIS — E1151 Type 2 diabetes mellitus with diabetic peripheral angiopathy without gangrene: Secondary | ICD-10-CM | POA: Diagnosis not present

## 2018-10-14 DIAGNOSIS — Z6841 Body Mass Index (BMI) 40.0 and over, adult: Secondary | ICD-10-CM | POA: Diagnosis not present

## 2018-10-14 DIAGNOSIS — I87312 Chronic venous hypertension (idiopathic) with ulcer of left lower extremity: Secondary | ICD-10-CM | POA: Diagnosis not present

## 2018-10-14 DIAGNOSIS — L97221 Non-pressure chronic ulcer of left calf limited to breakdown of skin: Secondary | ICD-10-CM | POA: Diagnosis not present

## 2018-10-14 DIAGNOSIS — E1151 Type 2 diabetes mellitus with diabetic peripheral angiopathy without gangrene: Secondary | ICD-10-CM | POA: Diagnosis not present

## 2018-10-14 DIAGNOSIS — I890 Lymphedema, not elsewhere classified: Secondary | ICD-10-CM | POA: Diagnosis not present

## 2018-10-18 DIAGNOSIS — Z6841 Body Mass Index (BMI) 40.0 and over, adult: Secondary | ICD-10-CM | POA: Diagnosis not present

## 2018-10-18 DIAGNOSIS — L97221 Non-pressure chronic ulcer of left calf limited to breakdown of skin: Secondary | ICD-10-CM | POA: Diagnosis not present

## 2018-10-18 DIAGNOSIS — I890 Lymphedema, not elsewhere classified: Secondary | ICD-10-CM | POA: Diagnosis not present

## 2018-10-18 DIAGNOSIS — I87312 Chronic venous hypertension (idiopathic) with ulcer of left lower extremity: Secondary | ICD-10-CM | POA: Diagnosis not present

## 2018-10-18 DIAGNOSIS — E1151 Type 2 diabetes mellitus with diabetic peripheral angiopathy without gangrene: Secondary | ICD-10-CM | POA: Diagnosis not present

## 2018-10-26 DIAGNOSIS — Z6841 Body Mass Index (BMI) 40.0 and over, adult: Secondary | ICD-10-CM | POA: Diagnosis not present

## 2018-10-26 DIAGNOSIS — E1151 Type 2 diabetes mellitus with diabetic peripheral angiopathy without gangrene: Secondary | ICD-10-CM | POA: Diagnosis not present

## 2018-10-26 DIAGNOSIS — L97221 Non-pressure chronic ulcer of left calf limited to breakdown of skin: Secondary | ICD-10-CM | POA: Diagnosis not present

## 2018-10-26 DIAGNOSIS — I87312 Chronic venous hypertension (idiopathic) with ulcer of left lower extremity: Secondary | ICD-10-CM | POA: Diagnosis not present

## 2018-10-26 DIAGNOSIS — I890 Lymphedema, not elsewhere classified: Secondary | ICD-10-CM | POA: Diagnosis not present

## 2018-10-28 ENCOUNTER — Encounter (HOSPITAL_BASED_OUTPATIENT_CLINIC_OR_DEPARTMENT_OTHER): Payer: BLUE CROSS/BLUE SHIELD | Attending: Internal Medicine

## 2018-10-28 DIAGNOSIS — Z6841 Body Mass Index (BMI) 40.0 and over, adult: Secondary | ICD-10-CM | POA: Insufficient documentation

## 2018-10-28 DIAGNOSIS — E6609 Other obesity due to excess calories: Secondary | ICD-10-CM | POA: Diagnosis not present

## 2018-10-28 DIAGNOSIS — L97822 Non-pressure chronic ulcer of other part of left lower leg with fat layer exposed: Secondary | ICD-10-CM | POA: Insufficient documentation

## 2018-10-28 DIAGNOSIS — I890 Lymphedema, not elsewhere classified: Secondary | ICD-10-CM | POA: Diagnosis not present

## 2018-10-28 DIAGNOSIS — E11622 Type 2 diabetes mellitus with other skin ulcer: Secondary | ICD-10-CM | POA: Insufficient documentation

## 2018-11-01 DIAGNOSIS — Z6841 Body Mass Index (BMI) 40.0 and over, adult: Secondary | ICD-10-CM | POA: Diagnosis not present

## 2018-11-01 DIAGNOSIS — L97221 Non-pressure chronic ulcer of left calf limited to breakdown of skin: Secondary | ICD-10-CM | POA: Diagnosis not present

## 2018-11-01 DIAGNOSIS — I890 Lymphedema, not elsewhere classified: Secondary | ICD-10-CM | POA: Diagnosis not present

## 2018-11-01 DIAGNOSIS — I87312 Chronic venous hypertension (idiopathic) with ulcer of left lower extremity: Secondary | ICD-10-CM | POA: Diagnosis not present

## 2018-11-01 DIAGNOSIS — E1151 Type 2 diabetes mellitus with diabetic peripheral angiopathy without gangrene: Secondary | ICD-10-CM | POA: Diagnosis not present

## 2018-11-08 DIAGNOSIS — L97221 Non-pressure chronic ulcer of left calf limited to breakdown of skin: Secondary | ICD-10-CM | POA: Diagnosis not present

## 2018-11-08 DIAGNOSIS — E1151 Type 2 diabetes mellitus with diabetic peripheral angiopathy without gangrene: Secondary | ICD-10-CM | POA: Diagnosis not present

## 2018-11-08 DIAGNOSIS — I890 Lymphedema, not elsewhere classified: Secondary | ICD-10-CM | POA: Diagnosis not present

## 2018-11-08 DIAGNOSIS — I87312 Chronic venous hypertension (idiopathic) with ulcer of left lower extremity: Secondary | ICD-10-CM | POA: Diagnosis not present

## 2018-11-08 DIAGNOSIS — Z6841 Body Mass Index (BMI) 40.0 and over, adult: Secondary | ICD-10-CM | POA: Diagnosis not present

## 2018-11-16 DIAGNOSIS — I890 Lymphedema, not elsewhere classified: Secondary | ICD-10-CM | POA: Diagnosis not present

## 2018-11-16 DIAGNOSIS — Z6841 Body Mass Index (BMI) 40.0 and over, adult: Secondary | ICD-10-CM | POA: Diagnosis not present

## 2018-11-16 DIAGNOSIS — E1151 Type 2 diabetes mellitus with diabetic peripheral angiopathy without gangrene: Secondary | ICD-10-CM | POA: Diagnosis not present

## 2018-11-16 DIAGNOSIS — I87312 Chronic venous hypertension (idiopathic) with ulcer of left lower extremity: Secondary | ICD-10-CM | POA: Diagnosis not present

## 2018-11-16 DIAGNOSIS — L97221 Non-pressure chronic ulcer of left calf limited to breakdown of skin: Secondary | ICD-10-CM | POA: Diagnosis not present

## 2018-11-22 DIAGNOSIS — I87312 Chronic venous hypertension (idiopathic) with ulcer of left lower extremity: Secondary | ICD-10-CM | POA: Diagnosis not present

## 2018-11-22 DIAGNOSIS — I890 Lymphedema, not elsewhere classified: Secondary | ICD-10-CM | POA: Diagnosis not present

## 2018-11-22 DIAGNOSIS — E1151 Type 2 diabetes mellitus with diabetic peripheral angiopathy without gangrene: Secondary | ICD-10-CM | POA: Diagnosis not present

## 2018-11-22 DIAGNOSIS — L97221 Non-pressure chronic ulcer of left calf limited to breakdown of skin: Secondary | ICD-10-CM | POA: Diagnosis not present

## 2018-11-22 DIAGNOSIS — Z6841 Body Mass Index (BMI) 40.0 and over, adult: Secondary | ICD-10-CM | POA: Diagnosis not present

## 2018-11-24 ENCOUNTER — Encounter (HOSPITAL_BASED_OUTPATIENT_CLINIC_OR_DEPARTMENT_OTHER): Payer: BLUE CROSS/BLUE SHIELD | Attending: Internal Medicine

## 2018-11-24 ENCOUNTER — Other Ambulatory Visit: Payer: Self-pay

## 2018-11-24 DIAGNOSIS — E11622 Type 2 diabetes mellitus with other skin ulcer: Secondary | ICD-10-CM | POA: Diagnosis not present

## 2018-11-24 DIAGNOSIS — E6609 Other obesity due to excess calories: Secondary | ICD-10-CM | POA: Insufficient documentation

## 2018-11-24 DIAGNOSIS — Z6841 Body Mass Index (BMI) 40.0 and over, adult: Secondary | ICD-10-CM | POA: Insufficient documentation

## 2018-11-24 DIAGNOSIS — I890 Lymphedema, not elsewhere classified: Secondary | ICD-10-CM | POA: Diagnosis not present

## 2018-11-24 DIAGNOSIS — S81002A Unspecified open wound, left knee, initial encounter: Secondary | ICD-10-CM | POA: Diagnosis not present

## 2018-11-24 DIAGNOSIS — L97822 Non-pressure chronic ulcer of other part of left lower leg with fat layer exposed: Secondary | ICD-10-CM | POA: Insufficient documentation

## 2018-11-29 DIAGNOSIS — I890 Lymphedema, not elsewhere classified: Secondary | ICD-10-CM | POA: Diagnosis not present

## 2018-11-29 DIAGNOSIS — E1151 Type 2 diabetes mellitus with diabetic peripheral angiopathy without gangrene: Secondary | ICD-10-CM | POA: Diagnosis not present

## 2018-11-29 DIAGNOSIS — L97221 Non-pressure chronic ulcer of left calf limited to breakdown of skin: Secondary | ICD-10-CM | POA: Diagnosis not present

## 2018-11-29 DIAGNOSIS — Z6841 Body Mass Index (BMI) 40.0 and over, adult: Secondary | ICD-10-CM | POA: Diagnosis not present

## 2018-11-29 DIAGNOSIS — I87312 Chronic venous hypertension (idiopathic) with ulcer of left lower extremity: Secondary | ICD-10-CM | POA: Diagnosis not present

## 2018-12-06 DIAGNOSIS — Z6841 Body Mass Index (BMI) 40.0 and over, adult: Secondary | ICD-10-CM | POA: Diagnosis not present

## 2018-12-06 DIAGNOSIS — E1151 Type 2 diabetes mellitus with diabetic peripheral angiopathy without gangrene: Secondary | ICD-10-CM | POA: Diagnosis not present

## 2018-12-06 DIAGNOSIS — I87312 Chronic venous hypertension (idiopathic) with ulcer of left lower extremity: Secondary | ICD-10-CM | POA: Diagnosis not present

## 2018-12-06 DIAGNOSIS — I890 Lymphedema, not elsewhere classified: Secondary | ICD-10-CM | POA: Diagnosis not present

## 2018-12-06 DIAGNOSIS — L97221 Non-pressure chronic ulcer of left calf limited to breakdown of skin: Secondary | ICD-10-CM | POA: Diagnosis not present

## 2018-12-13 DIAGNOSIS — E1151 Type 2 diabetes mellitus with diabetic peripheral angiopathy without gangrene: Secondary | ICD-10-CM | POA: Diagnosis not present

## 2018-12-13 DIAGNOSIS — I87312 Chronic venous hypertension (idiopathic) with ulcer of left lower extremity: Secondary | ICD-10-CM | POA: Diagnosis not present

## 2018-12-13 DIAGNOSIS — L97221 Non-pressure chronic ulcer of left calf limited to breakdown of skin: Secondary | ICD-10-CM | POA: Diagnosis not present

## 2018-12-13 DIAGNOSIS — I890 Lymphedema, not elsewhere classified: Secondary | ICD-10-CM | POA: Diagnosis not present

## 2018-12-13 DIAGNOSIS — Z6841 Body Mass Index (BMI) 40.0 and over, adult: Secondary | ICD-10-CM | POA: Diagnosis not present

## 2018-12-21 DIAGNOSIS — Z6841 Body Mass Index (BMI) 40.0 and over, adult: Secondary | ICD-10-CM | POA: Diagnosis not present

## 2018-12-21 DIAGNOSIS — I87312 Chronic venous hypertension (idiopathic) with ulcer of left lower extremity: Secondary | ICD-10-CM | POA: Diagnosis not present

## 2018-12-21 DIAGNOSIS — L97221 Non-pressure chronic ulcer of left calf limited to breakdown of skin: Secondary | ICD-10-CM | POA: Diagnosis not present

## 2018-12-21 DIAGNOSIS — I890 Lymphedema, not elsewhere classified: Secondary | ICD-10-CM | POA: Diagnosis not present

## 2018-12-21 DIAGNOSIS — E1151 Type 2 diabetes mellitus with diabetic peripheral angiopathy without gangrene: Secondary | ICD-10-CM | POA: Diagnosis not present

## 2018-12-27 DIAGNOSIS — Z6841 Body Mass Index (BMI) 40.0 and over, adult: Secondary | ICD-10-CM | POA: Diagnosis not present

## 2018-12-27 DIAGNOSIS — I87312 Chronic venous hypertension (idiopathic) with ulcer of left lower extremity: Secondary | ICD-10-CM | POA: Diagnosis not present

## 2018-12-27 DIAGNOSIS — E1151 Type 2 diabetes mellitus with diabetic peripheral angiopathy without gangrene: Secondary | ICD-10-CM | POA: Diagnosis not present

## 2018-12-27 DIAGNOSIS — I890 Lymphedema, not elsewhere classified: Secondary | ICD-10-CM | POA: Diagnosis not present

## 2018-12-27 DIAGNOSIS — L97221 Non-pressure chronic ulcer of left calf limited to breakdown of skin: Secondary | ICD-10-CM | POA: Diagnosis not present

## 2018-12-29 ENCOUNTER — Encounter (HOSPITAL_BASED_OUTPATIENT_CLINIC_OR_DEPARTMENT_OTHER): Payer: BLUE CROSS/BLUE SHIELD | Attending: Internal Medicine

## 2018-12-29 DIAGNOSIS — L97822 Non-pressure chronic ulcer of other part of left lower leg with fat layer exposed: Secondary | ICD-10-CM | POA: Diagnosis not present

## 2018-12-29 DIAGNOSIS — E11622 Type 2 diabetes mellitus with other skin ulcer: Secondary | ICD-10-CM | POA: Insufficient documentation

## 2018-12-29 DIAGNOSIS — I890 Lymphedema, not elsewhere classified: Secondary | ICD-10-CM | POA: Insufficient documentation

## 2018-12-29 DIAGNOSIS — Z6841 Body Mass Index (BMI) 40.0 and over, adult: Secondary | ICD-10-CM | POA: Diagnosis not present

## 2018-12-29 DIAGNOSIS — I87332 Chronic venous hypertension (idiopathic) with ulcer and inflammation of left lower extremity: Secondary | ICD-10-CM | POA: Insufficient documentation

## 2018-12-29 DIAGNOSIS — E669 Obesity, unspecified: Secondary | ICD-10-CM | POA: Diagnosis not present

## 2018-12-29 DIAGNOSIS — S81002A Unspecified open wound, left knee, initial encounter: Secondary | ICD-10-CM | POA: Diagnosis not present

## 2019-01-03 DIAGNOSIS — Z6841 Body Mass Index (BMI) 40.0 and over, adult: Secondary | ICD-10-CM | POA: Diagnosis not present

## 2019-01-03 DIAGNOSIS — Z7984 Long term (current) use of oral hypoglycemic drugs: Secondary | ICD-10-CM | POA: Diagnosis not present

## 2019-01-03 DIAGNOSIS — Z4800 Encounter for change or removal of nonsurgical wound dressing: Secondary | ICD-10-CM | POA: Diagnosis not present

## 2019-01-03 DIAGNOSIS — L97221 Non-pressure chronic ulcer of left calf limited to breakdown of skin: Secondary | ICD-10-CM | POA: Diagnosis not present

## 2019-01-03 DIAGNOSIS — I890 Lymphedema, not elsewhere classified: Secondary | ICD-10-CM | POA: Diagnosis not present

## 2019-01-03 DIAGNOSIS — I87312 Chronic venous hypertension (idiopathic) with ulcer of left lower extremity: Secondary | ICD-10-CM | POA: Diagnosis not present

## 2019-01-03 DIAGNOSIS — E1151 Type 2 diabetes mellitus with diabetic peripheral angiopathy without gangrene: Secondary | ICD-10-CM | POA: Diagnosis not present

## 2019-01-03 DIAGNOSIS — M170 Bilateral primary osteoarthritis of knee: Secondary | ICD-10-CM | POA: Diagnosis not present

## 2019-01-12 DIAGNOSIS — M170 Bilateral primary osteoarthritis of knee: Secondary | ICD-10-CM | POA: Diagnosis not present

## 2019-01-12 DIAGNOSIS — Z4800 Encounter for change or removal of nonsurgical wound dressing: Secondary | ICD-10-CM | POA: Diagnosis not present

## 2019-01-12 DIAGNOSIS — Z6841 Body Mass Index (BMI) 40.0 and over, adult: Secondary | ICD-10-CM | POA: Diagnosis not present

## 2019-01-12 DIAGNOSIS — I87312 Chronic venous hypertension (idiopathic) with ulcer of left lower extremity: Secondary | ICD-10-CM | POA: Diagnosis not present

## 2019-01-12 DIAGNOSIS — E1151 Type 2 diabetes mellitus with diabetic peripheral angiopathy without gangrene: Secondary | ICD-10-CM | POA: Diagnosis not present

## 2019-01-12 DIAGNOSIS — I890 Lymphedema, not elsewhere classified: Secondary | ICD-10-CM | POA: Diagnosis not present

## 2019-01-12 DIAGNOSIS — L97221 Non-pressure chronic ulcer of left calf limited to breakdown of skin: Secondary | ICD-10-CM | POA: Diagnosis not present

## 2019-01-12 DIAGNOSIS — Z7984 Long term (current) use of oral hypoglycemic drugs: Secondary | ICD-10-CM | POA: Diagnosis not present

## 2019-01-17 DIAGNOSIS — E1151 Type 2 diabetes mellitus with diabetic peripheral angiopathy without gangrene: Secondary | ICD-10-CM | POA: Diagnosis not present

## 2019-01-17 DIAGNOSIS — Z7984 Long term (current) use of oral hypoglycemic drugs: Secondary | ICD-10-CM | POA: Diagnosis not present

## 2019-01-17 DIAGNOSIS — I890 Lymphedema, not elsewhere classified: Secondary | ICD-10-CM | POA: Diagnosis not present

## 2019-01-17 DIAGNOSIS — Z6841 Body Mass Index (BMI) 40.0 and over, adult: Secondary | ICD-10-CM | POA: Diagnosis not present

## 2019-01-17 DIAGNOSIS — M170 Bilateral primary osteoarthritis of knee: Secondary | ICD-10-CM | POA: Diagnosis not present

## 2019-01-17 DIAGNOSIS — I87312 Chronic venous hypertension (idiopathic) with ulcer of left lower extremity: Secondary | ICD-10-CM | POA: Diagnosis not present

## 2019-01-17 DIAGNOSIS — L97221 Non-pressure chronic ulcer of left calf limited to breakdown of skin: Secondary | ICD-10-CM | POA: Diagnosis not present

## 2019-01-17 DIAGNOSIS — Z4800 Encounter for change or removal of nonsurgical wound dressing: Secondary | ICD-10-CM | POA: Diagnosis not present

## 2019-01-24 DIAGNOSIS — M170 Bilateral primary osteoarthritis of knee: Secondary | ICD-10-CM | POA: Diagnosis not present

## 2019-01-24 DIAGNOSIS — Z4800 Encounter for change or removal of nonsurgical wound dressing: Secondary | ICD-10-CM | POA: Diagnosis not present

## 2019-01-24 DIAGNOSIS — Z6841 Body Mass Index (BMI) 40.0 and over, adult: Secondary | ICD-10-CM | POA: Diagnosis not present

## 2019-01-24 DIAGNOSIS — Z7984 Long term (current) use of oral hypoglycemic drugs: Secondary | ICD-10-CM | POA: Diagnosis not present

## 2019-01-24 DIAGNOSIS — I890 Lymphedema, not elsewhere classified: Secondary | ICD-10-CM | POA: Diagnosis not present

## 2019-01-24 DIAGNOSIS — L97221 Non-pressure chronic ulcer of left calf limited to breakdown of skin: Secondary | ICD-10-CM | POA: Diagnosis not present

## 2019-01-24 DIAGNOSIS — I87312 Chronic venous hypertension (idiopathic) with ulcer of left lower extremity: Secondary | ICD-10-CM | POA: Diagnosis not present

## 2019-01-24 DIAGNOSIS — E1151 Type 2 diabetes mellitus with diabetic peripheral angiopathy without gangrene: Secondary | ICD-10-CM | POA: Diagnosis not present

## 2019-01-26 ENCOUNTER — Encounter (HOSPITAL_BASED_OUTPATIENT_CLINIC_OR_DEPARTMENT_OTHER): Payer: BLUE CROSS/BLUE SHIELD | Attending: Internal Medicine

## 2019-01-26 ENCOUNTER — Other Ambulatory Visit: Payer: Self-pay

## 2019-01-26 DIAGNOSIS — E11622 Type 2 diabetes mellitus with other skin ulcer: Secondary | ICD-10-CM | POA: Insufficient documentation

## 2019-01-26 DIAGNOSIS — Z6841 Body Mass Index (BMI) 40.0 and over, adult: Secondary | ICD-10-CM | POA: Insufficient documentation

## 2019-01-26 DIAGNOSIS — I87332 Chronic venous hypertension (idiopathic) with ulcer and inflammation of left lower extremity: Secondary | ICD-10-CM | POA: Insufficient documentation

## 2019-01-26 DIAGNOSIS — E669 Obesity, unspecified: Secondary | ICD-10-CM | POA: Diagnosis not present

## 2019-01-26 DIAGNOSIS — I890 Lymphedema, not elsewhere classified: Secondary | ICD-10-CM | POA: Insufficient documentation

## 2019-01-26 DIAGNOSIS — L97822 Non-pressure chronic ulcer of other part of left lower leg with fat layer exposed: Secondary | ICD-10-CM | POA: Insufficient documentation

## 2019-01-26 DIAGNOSIS — S81802A Unspecified open wound, left lower leg, initial encounter: Secondary | ICD-10-CM | POA: Diagnosis not present

## 2019-01-26 DIAGNOSIS — L89899 Pressure ulcer of other site, unspecified stage: Secondary | ICD-10-CM | POA: Diagnosis not present

## 2019-01-31 DIAGNOSIS — M170 Bilateral primary osteoarthritis of knee: Secondary | ICD-10-CM | POA: Diagnosis not present

## 2019-01-31 DIAGNOSIS — Z4800 Encounter for change or removal of nonsurgical wound dressing: Secondary | ICD-10-CM | POA: Diagnosis not present

## 2019-01-31 DIAGNOSIS — I87312 Chronic venous hypertension (idiopathic) with ulcer of left lower extremity: Secondary | ICD-10-CM | POA: Diagnosis not present

## 2019-01-31 DIAGNOSIS — E1151 Type 2 diabetes mellitus with diabetic peripheral angiopathy without gangrene: Secondary | ICD-10-CM | POA: Diagnosis not present

## 2019-01-31 DIAGNOSIS — Z7984 Long term (current) use of oral hypoglycemic drugs: Secondary | ICD-10-CM | POA: Diagnosis not present

## 2019-01-31 DIAGNOSIS — I890 Lymphedema, not elsewhere classified: Secondary | ICD-10-CM | POA: Diagnosis not present

## 2019-01-31 DIAGNOSIS — L97221 Non-pressure chronic ulcer of left calf limited to breakdown of skin: Secondary | ICD-10-CM | POA: Diagnosis not present

## 2019-01-31 DIAGNOSIS — Z6841 Body Mass Index (BMI) 40.0 and over, adult: Secondary | ICD-10-CM | POA: Diagnosis not present

## 2019-02-07 DIAGNOSIS — Z4800 Encounter for change or removal of nonsurgical wound dressing: Secondary | ICD-10-CM | POA: Diagnosis not present

## 2019-02-07 DIAGNOSIS — M170 Bilateral primary osteoarthritis of knee: Secondary | ICD-10-CM | POA: Diagnosis not present

## 2019-02-07 DIAGNOSIS — Z6841 Body Mass Index (BMI) 40.0 and over, adult: Secondary | ICD-10-CM | POA: Diagnosis not present

## 2019-02-07 DIAGNOSIS — L97221 Non-pressure chronic ulcer of left calf limited to breakdown of skin: Secondary | ICD-10-CM | POA: Diagnosis not present

## 2019-02-07 DIAGNOSIS — Z7984 Long term (current) use of oral hypoglycemic drugs: Secondary | ICD-10-CM | POA: Diagnosis not present

## 2019-02-07 DIAGNOSIS — I890 Lymphedema, not elsewhere classified: Secondary | ICD-10-CM | POA: Diagnosis not present

## 2019-02-07 DIAGNOSIS — I87312 Chronic venous hypertension (idiopathic) with ulcer of left lower extremity: Secondary | ICD-10-CM | POA: Diagnosis not present

## 2019-02-07 DIAGNOSIS — E1151 Type 2 diabetes mellitus with diabetic peripheral angiopathy without gangrene: Secondary | ICD-10-CM | POA: Diagnosis not present

## 2019-02-14 DIAGNOSIS — E1151 Type 2 diabetes mellitus with diabetic peripheral angiopathy without gangrene: Secondary | ICD-10-CM | POA: Diagnosis not present

## 2019-02-14 DIAGNOSIS — I87312 Chronic venous hypertension (idiopathic) with ulcer of left lower extremity: Secondary | ICD-10-CM | POA: Diagnosis not present

## 2019-02-14 DIAGNOSIS — L97221 Non-pressure chronic ulcer of left calf limited to breakdown of skin: Secondary | ICD-10-CM | POA: Diagnosis not present

## 2019-02-14 DIAGNOSIS — Z4800 Encounter for change or removal of nonsurgical wound dressing: Secondary | ICD-10-CM | POA: Diagnosis not present

## 2019-02-14 DIAGNOSIS — I890 Lymphedema, not elsewhere classified: Secondary | ICD-10-CM | POA: Diagnosis not present

## 2019-02-14 DIAGNOSIS — M170 Bilateral primary osteoarthritis of knee: Secondary | ICD-10-CM | POA: Diagnosis not present

## 2019-02-14 DIAGNOSIS — Z7984 Long term (current) use of oral hypoglycemic drugs: Secondary | ICD-10-CM | POA: Diagnosis not present

## 2019-02-14 DIAGNOSIS — Z6841 Body Mass Index (BMI) 40.0 and over, adult: Secondary | ICD-10-CM | POA: Diagnosis not present

## 2019-02-21 DIAGNOSIS — Z7984 Long term (current) use of oral hypoglycemic drugs: Secondary | ICD-10-CM | POA: Diagnosis not present

## 2019-02-21 DIAGNOSIS — Z4800 Encounter for change or removal of nonsurgical wound dressing: Secondary | ICD-10-CM | POA: Diagnosis not present

## 2019-02-21 DIAGNOSIS — I890 Lymphedema, not elsewhere classified: Secondary | ICD-10-CM | POA: Diagnosis not present

## 2019-02-21 DIAGNOSIS — Z6841 Body Mass Index (BMI) 40.0 and over, adult: Secondary | ICD-10-CM | POA: Diagnosis not present

## 2019-02-21 DIAGNOSIS — L97221 Non-pressure chronic ulcer of left calf limited to breakdown of skin: Secondary | ICD-10-CM | POA: Diagnosis not present

## 2019-02-21 DIAGNOSIS — M170 Bilateral primary osteoarthritis of knee: Secondary | ICD-10-CM | POA: Diagnosis not present

## 2019-02-21 DIAGNOSIS — E1151 Type 2 diabetes mellitus with diabetic peripheral angiopathy without gangrene: Secondary | ICD-10-CM | POA: Diagnosis not present

## 2019-02-21 DIAGNOSIS — I87312 Chronic venous hypertension (idiopathic) with ulcer of left lower extremity: Secondary | ICD-10-CM | POA: Diagnosis not present

## 2019-02-28 DIAGNOSIS — M170 Bilateral primary osteoarthritis of knee: Secondary | ICD-10-CM | POA: Diagnosis not present

## 2019-02-28 DIAGNOSIS — Z7984 Long term (current) use of oral hypoglycemic drugs: Secondary | ICD-10-CM | POA: Diagnosis not present

## 2019-02-28 DIAGNOSIS — I87312 Chronic venous hypertension (idiopathic) with ulcer of left lower extremity: Secondary | ICD-10-CM | POA: Diagnosis not present

## 2019-02-28 DIAGNOSIS — E1151 Type 2 diabetes mellitus with diabetic peripheral angiopathy without gangrene: Secondary | ICD-10-CM | POA: Diagnosis not present

## 2019-02-28 DIAGNOSIS — Z6841 Body Mass Index (BMI) 40.0 and over, adult: Secondary | ICD-10-CM | POA: Diagnosis not present

## 2019-02-28 DIAGNOSIS — L97221 Non-pressure chronic ulcer of left calf limited to breakdown of skin: Secondary | ICD-10-CM | POA: Diagnosis not present

## 2019-02-28 DIAGNOSIS — I890 Lymphedema, not elsewhere classified: Secondary | ICD-10-CM | POA: Diagnosis not present

## 2019-02-28 DIAGNOSIS — Z4800 Encounter for change or removal of nonsurgical wound dressing: Secondary | ICD-10-CM | POA: Diagnosis not present

## 2019-03-02 ENCOUNTER — Encounter (HOSPITAL_BASED_OUTPATIENT_CLINIC_OR_DEPARTMENT_OTHER): Payer: BLUE CROSS/BLUE SHIELD | Attending: Internal Medicine

## 2019-03-02 DIAGNOSIS — L97222 Non-pressure chronic ulcer of left calf with fat layer exposed: Secondary | ICD-10-CM | POA: Diagnosis not present

## 2019-03-02 DIAGNOSIS — E11622 Type 2 diabetes mellitus with other skin ulcer: Secondary | ICD-10-CM | POA: Diagnosis not present

## 2019-03-02 DIAGNOSIS — L97822 Non-pressure chronic ulcer of other part of left lower leg with fat layer exposed: Secondary | ICD-10-CM | POA: Diagnosis not present

## 2019-03-08 DIAGNOSIS — M170 Bilateral primary osteoarthritis of knee: Secondary | ICD-10-CM | POA: Diagnosis not present

## 2019-03-08 DIAGNOSIS — I87312 Chronic venous hypertension (idiopathic) with ulcer of left lower extremity: Secondary | ICD-10-CM | POA: Diagnosis not present

## 2019-03-08 DIAGNOSIS — L97221 Non-pressure chronic ulcer of left calf limited to breakdown of skin: Secondary | ICD-10-CM | POA: Diagnosis not present

## 2019-03-08 DIAGNOSIS — I890 Lymphedema, not elsewhere classified: Secondary | ICD-10-CM | POA: Diagnosis not present

## 2019-03-08 DIAGNOSIS — Z6841 Body Mass Index (BMI) 40.0 and over, adult: Secondary | ICD-10-CM | POA: Diagnosis not present

## 2019-03-08 DIAGNOSIS — E1151 Type 2 diabetes mellitus with diabetic peripheral angiopathy without gangrene: Secondary | ICD-10-CM | POA: Diagnosis not present

## 2019-03-08 DIAGNOSIS — Z4800 Encounter for change or removal of nonsurgical wound dressing: Secondary | ICD-10-CM | POA: Diagnosis not present

## 2019-03-14 DIAGNOSIS — I87312 Chronic venous hypertension (idiopathic) with ulcer of left lower extremity: Secondary | ICD-10-CM | POA: Diagnosis not present

## 2019-03-14 DIAGNOSIS — E1151 Type 2 diabetes mellitus with diabetic peripheral angiopathy without gangrene: Secondary | ICD-10-CM | POA: Diagnosis not present

## 2019-03-14 DIAGNOSIS — I890 Lymphedema, not elsewhere classified: Secondary | ICD-10-CM | POA: Diagnosis not present

## 2019-03-14 DIAGNOSIS — Z6841 Body Mass Index (BMI) 40.0 and over, adult: Secondary | ICD-10-CM | POA: Diagnosis not present

## 2019-03-14 DIAGNOSIS — L97221 Non-pressure chronic ulcer of left calf limited to breakdown of skin: Secondary | ICD-10-CM | POA: Diagnosis not present

## 2019-03-14 DIAGNOSIS — M170 Bilateral primary osteoarthritis of knee: Secondary | ICD-10-CM | POA: Diagnosis not present

## 2019-03-14 DIAGNOSIS — Z4800 Encounter for change or removal of nonsurgical wound dressing: Secondary | ICD-10-CM | POA: Diagnosis not present

## 2019-03-21 DIAGNOSIS — I890 Lymphedema, not elsewhere classified: Secondary | ICD-10-CM | POA: Diagnosis not present

## 2019-03-21 DIAGNOSIS — I87312 Chronic venous hypertension (idiopathic) with ulcer of left lower extremity: Secondary | ICD-10-CM | POA: Diagnosis not present

## 2019-03-21 DIAGNOSIS — M170 Bilateral primary osteoarthritis of knee: Secondary | ICD-10-CM | POA: Diagnosis not present

## 2019-03-21 DIAGNOSIS — Z6841 Body Mass Index (BMI) 40.0 and over, adult: Secondary | ICD-10-CM | POA: Diagnosis not present

## 2019-03-21 DIAGNOSIS — L97221 Non-pressure chronic ulcer of left calf limited to breakdown of skin: Secondary | ICD-10-CM | POA: Diagnosis not present

## 2019-03-21 DIAGNOSIS — Z4800 Encounter for change or removal of nonsurgical wound dressing: Secondary | ICD-10-CM | POA: Diagnosis not present

## 2019-03-21 DIAGNOSIS — E1151 Type 2 diabetes mellitus with diabetic peripheral angiopathy without gangrene: Secondary | ICD-10-CM | POA: Diagnosis not present

## 2019-04-04 ENCOUNTER — Encounter (HOSPITAL_BASED_OUTPATIENT_CLINIC_OR_DEPARTMENT_OTHER): Payer: BLUE CROSS/BLUE SHIELD | Attending: Internal Medicine | Admitting: Internal Medicine

## 2019-04-04 ENCOUNTER — Other Ambulatory Visit: Payer: Self-pay

## 2019-04-04 DIAGNOSIS — Z6841 Body Mass Index (BMI) 40.0 and over, adult: Secondary | ICD-10-CM | POA: Diagnosis not present

## 2019-04-04 DIAGNOSIS — E11622 Type 2 diabetes mellitus with other skin ulcer: Secondary | ICD-10-CM | POA: Insufficient documentation

## 2019-04-04 DIAGNOSIS — L97822 Non-pressure chronic ulcer of other part of left lower leg with fat layer exposed: Secondary | ICD-10-CM | POA: Insufficient documentation

## 2019-04-04 DIAGNOSIS — E6609 Other obesity due to excess calories: Secondary | ICD-10-CM | POA: Diagnosis not present

## 2019-04-04 DIAGNOSIS — I890 Lymphedema, not elsewhere classified: Secondary | ICD-10-CM | POA: Diagnosis not present

## 2019-04-04 DIAGNOSIS — L97222 Non-pressure chronic ulcer of left calf with fat layer exposed: Secondary | ICD-10-CM | POA: Diagnosis not present

## 2019-05-10 NOTE — Progress Notes (Addendum)
MIKILA, MAINES ([id number]) Visit Report for 03/02/2019 HPI Details Patient Name: Date of Service: SHANTERRA, MOYER 03/02/2019 11:00 AM Medical Record [id number] Patient Account Number: [id number] Date of Birth/Sex: Treating RN: 1966-01-04 (53 y.o. 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 She states that she noticed the area in October 2017. She is unaware of actual causative factor, states she has a history of eczema and uses a brush for bathing. She was being treated by her PCP, using Aquaphor since then, and stopped using last week. She denies ever being on an antibiotic for this ulcer. She states that this drains excessively and soaks through her clothes, she is unable to visualize this area and has a friend that helps with the dressing changes. She was recently diagnosed with diabetes; A1c in December was 9; down to 7.5-8 in February. She is being managed on oral agents. She is a non-smoker. She has never been evaluated for venous reflux or lymphedema. 10/08/16- She is here in follow up regarding her left popliteal fossa pressure ulcer. She states that she did not receive any supplies as ordered. She states that the dressing material that was ordered and placed last week was in place for several days. She states that the odor and drainage is unchanged. She states that her PCP has written for 1 month off work, starting Monday 4/23. The culture obtained last week was mixed skin flora, no identifiable pathogen. 10/15/16;  difficult wound in the left popliteal fossa in the setting of morbid obesity and lymphedema. We have been using silver alginate although it is not really clear to me that this stays on for very long. She changes it once. Covering with AVD pads to try and separate wound from the surrounding pannus. A culture of this area showed both enterococcus and Enterobacter and I have her on a combination of amoxicillin and cipro. Although most Gram positives will be covered by Cipro, a big exception is Enterobacter particularly Enterobacter faecalis which generally requires ampicillin if indeed it is sensitive. A final issue is that the patient is limited by the co-pay on her insurance with regards to actual wound care supplies. I didn't get into this in much detail although he crossed my mind that we may need to bring her back more than once a week for dressing changes. 10/22/16; difficult wound in the left popliteal fossa. I think this is a lymphedema type issue in the middle lobe folds in the back of her knee. We have been using silver alginate, ABD pads. Dimensions today are improved, base of the wound is improved. I had some thoughts about biopsying this however overall this is improved today and all put that off for now 10/29/16; difficult wound in the left popliteal fossa which I think is a pressure wound from associated large folds of lymphedema. We have been using silver alginate. 11/05/16; difficult wound in the left popliteal fossa. Using silver alginate change 3x/wk. Wound measuring slightly smaller 11/12/16; difficult wound in the setting of chronic lymphedema. Left popliteal fossa. Using so over alginate change 3 times a week, once here and wants by home health and/or the patient's friend who  is a Marine scientist. The dimensions of this appear to be improving, the surface is certainly improved [id number]; Patient now has lymphedema pumps. wound is slightly reduced in width. Still using silver alginate 11/26/16;  patient has lymphedema pumps although she is having trouble with them in terms of fit. Nevertheless she tells me she is using them. She is still using silver alginate as the primary dressing 12/03/16; patient has lymphedema pumps and there is been significant improvement in the swelling in the leg. We've been using silver alginate but really only making minimal improvements if any in the overall wound area although the base of the wound is a lot better than when she first came here. 12/10/16; patient has increasing pain, drainage and odor this week. States this began to get worse over the weekend. She is been using her pumps at home and the edema is certainly better in the leg. We changed to [facility] Blue last week. She has home health coming out once a week to change the dressing 12/17/16; culture I did last week showed a few MSSA. We called her in cephalexin at 500 4 times a day. She admits to less than 100% compliance with the 4 times a day dosing. I have encouraged her today. The surrounding tenderness and erythema in this wound is a lot better. Nursing reports that the diameter of the wound is down a centimeter. Healthy-looking wound base no surrounding tenderness. We continue to use silver alginate 12/24/16; she is still completing her antibiotics apparently not taking them exactly as prescribed. The wound is perhaps marginally smaller. Surface of this continues to look not 100% viable. We have been using silver collagen. Consider changing to Iodoflex 12/31/16; she is finished her antibiotics. There is no evidence to suspect ongoing infection in the wound. We have looked back over the pictures of the wound in the left popliteal fossa. She came in with a thick adherent necrotic eschar and now we are dealing with slight surface slough. No debridement will be planned today. I'm going to try [facility] in lieu of the silver alginate starting today 01/07/17; patient continues to do well using  Hydrofera Blue. Reliably using her external compression pumps twice a day 01/15/17; the patient has been using [facility] with a general improvement in her wound condition and size. However she went back to work yesterday it would seem that her use of the compression pumps this week is been intermittent and at work she is unable to change the dressings. Per our intake nurse arrived with a macerated dressing in place. 01/22/17 on evaluation today patient appears to be doing a little bit worse. She is having more discomfort and there's a erythema surrounding the wound bed behind her left knee. She states this discomfort often is associated with infection and in fact this wound looks to likely be infected. She has no fevers, chills, nausea, vomiting, or diarrhea. 02/12/17; the patient's wound is not as good as I thought I would find it. She was making a good improvement when I saw her a month ago. She went back to work and largely she blames difficulties at work for this. Apparently during my absence she was treated with oral antibiotics starting on 8/3. I don't believe there are any cultures 02/19/17; patient arrives in clinic today with the wound is essentially unchanged in terms of dimensions and wound appearance. We have been using silver alginate. She claims compliance with the compression pumps twice a day 02/25/17; no major change in the  condition of the wound. We used Iodo flex last week with the purpose of trying to get a better-looking surface. She could not tolerate this because of discomfort. She found this especially bad when she used her compression pumps. Before this we use silver alginate again with not a lot of success. I changed her to [employer] which will certainly not have much debridement activity however I will probably change to weekly mechanical debridements. This will help with the drainage. She is now at home/on short-term disability. 03/11/17; the patient is now on  disability. She is been using PolyMem AG wound for the first time looks a little better this week and dimensions have improved. She is compliant with her external compression pumps 03/18/16; the patient is now on disability at home using her palms. We've been using PolyMen AG. Wound certainly looks smaller. Arrives today with an adherent surface requiring debridement. 04/08/17; patient has not been here in 3 weeks. Using Anasept with PolyMem AG. She arrives today with an adherent surface over the wound. She did not wish debridement, there was edema fluid dripping through the wound surface even though she states she is using her external compression pumps 04/15/17; patient is using Anasept with PolyMem and AG. She arrives today telling us that her leg is been hurting since a home care nurse staff her dressing into her on Sunday therefore she could not use her compression pumps in spite of this her dimensions appear to be better although this is a very difficult wound to measure in the popliteal fossa of the left knee 04/22/17; arrives today in clinic using Anasept and PolyMem AG. She is using her compression pumps per her description. The wound surface however once again is not going to allow healing. She still has weeping edema fluid coming through the wound. No major change in dimensions 04/29/17; arrives today with the major deterioration in the wound both in terms of dimensions and condition of the wound surface. She reports increasing pain since earlier in the week at our intake nurse reports purulent drainage on the ABD pads.this is not going in the right direction. We put Hydrofera Blue on this last week but I'm going to change back to [employer] because of the drainage. I reviewed previous cultures last done in June showed methicillin sensitive staph aureus. We had been making progress with this wound presenting initially with a necrotic wound on the popliteal fossa on the right. We  managed to get a viable surface and some reduction in wound dimensions however in the past 4 weeks this is been getting gradually worse and today much worse in terms of both size and condition of the wound surface. The patient is having pain and will require empiric antibiotics because of the purulent drainage and deterioration in the wound 05/20/17; I haven't seen this patient in 3 weeks. Biopsy of this wound I did last time suggested extensive granulation tissue with collections of neutrophils lymphocytes plasma cells histiocytes and multinucleated giant cells. There was no evidence of malignancy. Deep tissue culture I did showed MSSA and I gave her Keflex when she was here last time and renewed this for a week however she is still taking the medication I'm not certain she actually took it properly.the pathologist suggested the possibility of pyoderma gangrenosum, a vasculopathy. PAS stains and acid-fast stains were negative She also tells me that she is having mobility problems secondary to pain in both knees. She uses a walker but can barely make it to  the bathroom. It sounds as though she has a history of chronic osteoarthritis and she tells me that she had a left anterior cruciate ligament tear at some time in the past 06/10/17; this is a patient that I haven't seen in 3 weeks. She is completed her antibiotics. Biopsy suggested the possibility of infection versus pyoderma gangrenosum. I gave her 2 weeks of Keflex directed at [facility] she is repeated this.she called last week to request more antibiotics but I wanted to see her. She arrives today with her aunt. Apparently the patient has deteriorated in terms of function. According the patient and her aunt she can no longer ambulate or easily transfer. She is incontinent because she has urge incontinence etc. They requested admission to a rehabilitation facility or the hospital and then a rehabilitation facility. I was not aware that the  patient was declining in terms a shin but according to our staff that's been the case they haven't seen her walk in the him any months that she's been here. I wasn't really aware of this. In fact the patient was working up until the end of August. In any case she arrives with the wound a lot worse 07/01/17; this is a patient I haven't seen in 3 weeks. The last time she was here at the request of her family member we sent her to the ER. She apparently was kept overnight. She was treated for UTI. Attempts were made to find her long-term care although there was insurance and cost issues and she is back home. She now has her aunt living with her. I am not clear what they are using to the wound on the posterior left knee/popliteal fossa. Last biopsy I did of this area did not document pyoderma which had been raised by another/previous biopsy. We are not clear today what they're actually putting on the wound 07/15/17; she arrives today with improvement in her wound dimensions although there is still weeping edema fluid. She claims she is changing the dressing [silver alginate] twice daily. I've suggested increasing this to 3 times a day. She is changing the silver alginate twice a day 07/29/17; patient complains of pain drainage and odor coming from the wound which was different from 2 weeks ago.. We have been using silver alginate she has been compliant with her compression pumps 09/02/17; the patient continues to complain of pain and drainage. I gave her empiric antibiotics when she was last here 5 weeks ago but she says she only completed them recently. She seems confused about the time frame of her last visit here.she states she was concerned about an odor and stopped using the [facility] Blue on the wound in her left popliteal fossa about a week ago. I don't think she is dressing this with anything since. She is using her lymphedema pumps once a day.previous biopsy of done of this area showed  inflammation associated with stasis dermatitis or trauma She is still not walking apparently because of severe osteoarthritis in both knees. She thinks she'll be going back to work in June and she needs to be ambulatory although I really can't see this happening. 09/16/17; the patient is been using a mixture of Hydrofera Blue and PolyMen that she had left over. I'm not really sure how reliable this is. She tells me she is using her external compression pumps once a day. She had a steroid shot in her left knee this morning 09/30/17; 2 week follow-up for this patient with a difficult wound behind her left  popliteal fossa. She has home health. They're using Hydrofera Blue. Measurement slightly smaller. She states she is using her compression pumps once a day 10/14/17; 2 week follow-up for this woman with a difficult wound with find her left popliteal fossa. She has home health. Using Hydrofera Blue. Measurements again today slightly smaller. Most of the surface of this covered in a nonviable tightly green adherent material. Some of this may be retained Hydrofera Blue nevertheless was debrided with great difficulty. She asked me about her upcoming disability which I think happens in June. I told her that from my point of view the wound itself does not qualify her to be disabled by itself. Now I'm aware that she can no longer stand and walk. This really deteriorated quite a bit during the course of the time she is been here. I suspect this is due to severe osteoarthritis of both knees and she is seen orthopedics for this. I think this is a source of her disability but I don't really feel comfortable with this given the fact that I don't precisely know the exact diagnosis here. 10/28/17; 2 week follow-up for this woman with a difficult wound in her left popliteal fossa. This is complicated by lymphedema. She is episodic in using her compression pumps we obtained. We've been using Hydrofera Blue. Dimensions  of the wound are not any different however surface of this looks a lot better than last time. Prior to last visit at which time I had the debridement the wound, dimensions were slightly smaller. The patient came in last time talking to me about disability. I sent her to see her primary physician who I thought would do a proper evaluation for this patient with regards to her disability thoughts. The patient is able to stand and transfer but is no longer ambulatory. She has a wheelchair at home.Marland Kitchen She works as a Freight forwarder for [employer]. She can no longer drive. 11/08/17; this is a 2 week follow-up for patient with a difficult wound in the left popliteal fossa complicated by lymphedema. I am not sure about her compliance with the compression pumps we've been using Hydrofera Blue for 2 months now and we have been making decent progress however once again today she arrives with 50% of the overall wound area covered in tightly adherent black/green necrotic tissue. 12/02/17; difficult wound in the left popliteal fossa complicated by severe lymphedema. We've been using silver alginate although the patient ran out of this and she's been using Hydrofera Blue. Last time she is here she had a considerable amount of tightly adherent green necrotic tissue. Post debridement culture showed MSSA, I gave her 7 days worth of Septra DS which seems to have helped 12/24/17; almost palliative follow-up for this difficult area on the left popliteal fossa complicated by severe lymphedema. We've been using silver alginate although the patient tells me she's been using Hydrofera Blue. She does not separate the folds of this wound. She has been using her lymphedema pumps but not every day. 01/20/18 This is a difficult wound in the left popliteal fossa. Intake nurse noted some purulent drainage and necrotic material. She has severe lymphedema including the posterior thigh and the upper calf surrounding this wound and there is continued  leaking edema fluid out of the wound surface. This is not going to heal like this. She states she is being compliant with the lymphedema pumps once sometimes twice a day. However this is not controlling the edema around the wound either in the calf or  the posterior thigh 02/10/18; really not making any progress here. The wound is actually larger. There is no purulent drainage. Culture I did last time was negative/multiple organisms 03/10/18; 1 month follow-up. The wound may be measures slightly smaller. We've been using care max directly on the wound surface. The area definitely looks less moist. She tells me she is also you been using her pumps religiously twice a day. In fact the area actually looks dry today. 04/12/2018; 1 month follow-up. She states after I used silver nitrate on this wound area a month ago that the pain was intolerable and continued all month. This made it too painful for her to use the care of max reliably or to use her compression pumps. She arrives in clinic today with her wound probably twice the size. There is no purulent drainage 05/03/2018; the patient has been using her pumps reliably twice a day over the last week but she finds the combination of the pump and a dressing on the wound to be unduly painful therefore there is not been a dressing in the last week. She arrives in clinic today with the wound continuing to enlarge. She did not tolerate silver nitrate nor Kerramax directly on the wound 05/26/2018; the patient has not been using her pumps. Her wound in the left posterior popliteal fossa continues to expand. We are using silver alginate although the options for primary dressings are limited since she is paying out- of-pocket for this. Culture I did last time which was a PCR culture showed a scattered number of anaerobes low quantities. I did not think this was significant. 06/09/2018 the patient has not been using her pumps. She puts care of max in the area on  the posterior popliteal fossa. She has no one to help her clean this area. Home health is apparently coming by once a week. For some reason she has to purchase a lot of her supplies herself online. She does not have a lot of financial resources. She states the pumps hurt her and caused the wound to bleed. She is continuing to deteriorate Biopsy I did last visit did not show malignancy/fungus or atypical Mycobacterium. This is a second time I have biopsied this 06/30/2018; the patient states she is using her pumps at least once a day. She put silver alginate and a rolled towel in the area. She occasionally uses kerramax and/or ABD pads. She is decided that a application of silver nitrate I did because of very friable mucosa on this sometime in September is the reason that she could not use the pumps. I did not actually research this. This is clearly not the case 1/30; the patient states she is using her pumps once a day but sometimes for as long as 2 hours. I told her that she still needs to do this and additional time a day. She is using silver alginate. She states the [facility] hurts when she uses her pumps therefore she is not using this. She has ABD pads that she is getting from home health. Dimensions perhaps slightly improved 3/5; the patient uses her pumps once a day but she states for is sometimes as long as 2 hours. She has not managed to do this twice a day. She is using silver alginate. Separating these areas with ABDs and rolled towels. There is no change in dimensions of this wound if anything slightly larger although the surfaces look better 4/2; I do not see too much difference 1 way or the other in  the area on the left popliteal fossa. She is not using her pumps. She did describe an episode of pain and odor a week or 2 ago however she said it is mostly subsided. Still using silver alginate. 5/8; monthly follow-up for this large area in the left popliteal fossa. She is not using her  compression pumps there is surrounding lymphedema from tissue in the posterior thigh in the posterior calf. Her home health nurse had called 2 weeks ago to report black drainage on the surface, she used silver alginate after that and it seems to have cleared up. 6/4; Patient is not using pumps. Finds silver alginate painful. I think she is just using ABD's in the area. She has a new wound laterally to the original wound. Leaking copious lymphedema 7/9; monthly follow-up. Patient is using her pumps about once a week finds it painful. She uses some combination of silver calcium alginate. She has to buy her own supplies [place] and she uses an ABD. She has home health but her co-pay is such that it is cheaper for her to buy her supplies online 8/6-Patient is here for her monthly follow-up, she has started using her pumps again, apparently a couple of weeks ago she had a lot of pain and could not use the pumps, she is now using silver alginate, she states her left posterior calf wound had some drainage that was greenish now converted to serous 9/10- Here for monthly follow up Electronic Signature(s) Signed: 05/10/2019 2:31:47 PM By: Tobi Bastos MD, MBA Entered By: Tobi Bastos on 05/10/2019 14:31:47 -------------------------------------------------------------------------------- Physical Exam Details Patient Name: Date of Service: [name] 03/02/2019 11:00 AM Medical Record [id number] Patient Account Number: [id number] Date of Birth/Sex: Treating RN: 04-05-1966 (53 y.o. [name] Primary Care Provider: Fanny Bien Other Clinician: Referring Provider: Treating Provider/Extender:Deandria Klute, Allayne Gitelman, Rojelio Brenner in Treatment: 126 Constitutional alert and oriented x 3. sitting or standing blood pressure is within target range for patient.. supine blood pressure is within target range for patient.. pulse regular and within target range for  patient.Marland Kitchen respirations regular, non-labored and within target range for patient.Marland Kitchen temperature within target range for patient.. . . Well-nourished and well-hydrated in no acute distress. Notes Noted Left posterior Calf wound, stable Electronic Signature(s) Signed: 05/10/2019 2:33:31 PM By: Tobi Bastos MD, MBA Entered By: Tobi Bastos on 05/10/2019 14:33:30 -------------------------------------------------------------------------------- Physician Orders Details Patient Name: Date of Service: [name] 03/02/2019 11:00 AM Medical Record [id number] Patient Account Number: [id number] Date of Birth/Sex: Treating RN: Dec 16, 1965 (53 y.o. Helene Shoe, Meta.Reding Primary Care Provider: Fanny Bien Other Clinician: Referring Provider: Treating Provider/Extender:Ezmae Speers, Allayne Gitelman, Rojelio Brenner in Treatment: [phone] Verbal / Phone Orders: No Diagnosis Coding ICD-10 Coding Code Description I87.322 Chronic venous hypertension (idiopathic) with inflammation of left lower extremity I89.0 Lymphedema, not elsewhere classified L97.221 Non-pressure chronic ulcer of left calf limited to breakdown of skin E11.622 Type 2 diabetes mellitus with other skin ulcer E66.09 Other obesity due to excess calories Follow-up Appointments Return appointment in 1 month. Dressing Change Frequency Wound #1 Left,Posterior Knee Change dressing every day. Wound Cleansing May shower and wash wound with soap and water. - also cleanse with Vashe solution Primary Wound Dressing Wound #1 Left,Posterior Knee Calcium Alginate with Silver Wound #3 Left,Medial Lower Leg Calcium Alginate with Silver Secondary Dressing Wound #1 Left,Posterior Knee Kerlix/Rolled Gauze ABD pad - or incontinence pads secure with transpore tape Edema Control Avoid standing for long periods of time Elevate legs to  the level of the heart or above for 30 minutes daily and/or when sitting, a frequency of: -  throughout the day Exercise regularly Segmental Compressive Device. - lymphedema pumps 60 minutes 2 times per day! Off-Loading Other: - place rolled towel or washcloth in skin fold behind left knee to separate skin in fold Additional Orders / Instructions Follow Nutritious Diet - increase protein and vegetables to aid in wound healing. [facility] for wound care. - [place]. 2 times per week; patient is homebound. Electronic Signature(s) Signed: 03/02/2019 4:47:54 PM By: Tobi Bastos MD, MBA Signed: 03/02/2019 6:15:18 PM By: Deon Pilling Entered By: Deon Pilling on 03/02/2019 12:17:50 -------------------------------------------------------------------------------- Problem List Details Patient Name: Date of Service: [name] 03/02/2019 11:00 AM Medical Record [id number] Patient Account Number: [id number] Date of Birth/Sex: Treating RN: [birth year] (53 y.o. Helene Shoe, Meta.Reding Primary Care Provider: Fanny Bien Other Clinician: Referring Provider: Treating Provider/Extender:Talik Casique, Allayne Gitelman, Rojelio Brenner in Treatment: 126 Active Problems ICD-10 Evaluated Encounter Code Description Active Date Today Diagnosis I87.322 Chronic venous hypertension (idiopathic) with 11/05/2016 No Yes inflammation of left lower extremity I89.0 Lymphedema, not elsewhere classified 10/09/2016 No Yes L97.221 Non-pressure chronic ulcer of left calf limited to 11/05/2016 No Yes breakdown of skin E11.622 Type 2 diabetes mellitus with other skin ulcer 10/01/2016 No Yes E66.09 Other obesity due to excess calories 10/01/2016 No Yes Inactive Problems Resolved Problems Electronic Signature(s) Signed: 03/02/2019 4:47:54 PM By: Tobi Bastos MD, MBA Signed: 03/02/2019 6:15:18 PM By: Deon Pilling Entered By: Deon Pilling on 03/02/2019 11:56:01 -------------------------------------------------------------------------------- Progress Note  Details Patient Name: Date of Service: [name] 03/02/2019 11:00 AM Medical Record [id number] Patient Account Number: [id number] Date of Birth/Sex: Treating RN: [birth year] (53 y.o. [name] Primary Care Provider: Fanny Bien Other Clinician: Referring Provider: Treating Provider/Extender:Darlisha Kelm, Allayne Gitelman, Rojelio Brenner in Treatment: 126 Subjective History of Present Illness (HPI) The following HPI elements were documented for the patient's wound: Location: Left popliteal fossa Duration: She was aware of the wound October 2017 Context: The development/occurrence is unclear Modifying Factors: Chronic pressure secondary to thigh pannus 10/01/16- She is here for initial evaluation of her left popliteal fossa ulcer. She states that she noticed the area in October 2017. She is unaware of actual causative factor, states she has a history of eczema and uses a brush for bathing. She was being treated by her PCP, using Aquaphor since then, and stopped using last week. She denies ever being on an antibiotic for this ulcer. She states that this drains excessively and soaks through her clothes, she is unable to visualize this area and has a friend that helps with the dressing changes. She was recently diagnosed with diabetes; A1c in December was 9; down to 7.5-8 in February. She is being managed on oral agents. She is a non-smoker. She has never been evaluated for venous reflux or lymphedema. 10/08/16- She is here in follow up regarding her left popliteal fossa pressure ulcer. She states that she did not receive any supplies as ordered. She states that the dressing material that was ordered and placed last week was in place for several days. She states that the odor and drainage is unchanged. She states that her PCP has written for 1 month off work, starting Monday 4/23. The culture obtained last week was mixed skin flora, no identifiable pathogen. 10/15/16;  difficult wound in the left popliteal fossa in the setting of morbid obesity and lymphedema. We have been  using silver alginate although it is not really clear to me that this stays on for very long. She changes it once. Covering with AVD pads to try and separate wound from the surrounding pannus. A culture of this area showed both enterococcus and Enterobacter and I have her on a combination of amoxicillin and cipro. Although most Gram positives will be covered by Cipro, a big exception is Enterobacter particularly Enterobacter faecalis which generally requires ampicillin if indeed it is sensitive. A final issue is that the patient is limited by the co-pay on her insurance with regards to actual wound care supplies. I didn't get into this in much detail although he crossed my mind that we may need to bring her back more than once a week for dressing changes. 10/22/16; difficult wound in the left popliteal fossa. I think this is a lymphedema type issue in the middle lobe folds in the back of her knee. We have been using silver alginate, ABD pads. Dimensions today are improved, base of the wound is improved. I had some thoughts about biopsying this however overall this is improved today and all put that off for now 10/29/16; difficult wound in the left popliteal fossa which I think is a pressure wound from associated large folds of lymphedema. We have been using silver alginate. 11/05/16; difficult wound in the left popliteal fossa. Using silver alginate change 3x/wk. Wound measuring slightly smaller 11/12/16; difficult wound in the setting of chronic lymphedema. Left popliteal fossa. Using so over alginate change 3 times a week, once here and wants by home health and/or the patient's friend who is a Marine scientist. The dimensions of this appear to be improving, the surface is certainly improved [id number]; Patient now has lymphedema pumps. wound is slightly reduced in width. Still using silver alginate 11/26/16;  patient has lymphedema pumps although she is having trouble with them in terms of fit. Nevertheless she tells me she is using them. She is still using silver alginate as the primary dressing 12/03/16; patient has lymphedema pumps and there is been significant improvement in the swelling in the leg. We've been using silver alginate but really only making minimal improvements if any in the overall wound area although the base of the wound is a lot better than when she first came here. 12/10/16; patient has increasing pain, drainage and odor this week. States this began to get worse over the weekend. She is been using her pumps at home and the edema is certainly better in the leg. We changed to [facility] Blue last week. She has home health coming out once a week to change the dressing 12/17/16; culture I did last week showed a few MSSA. We called her in cephalexin at 500 4 times a day. She admits to less than 100% compliance with the 4 times a day dosing. I have encouraged her today. The surrounding tenderness and erythema in this wound is a lot better. Nursing reports that the diameter of the wound is down a centimeter. Healthy-looking wound base no surrounding tenderness. We continue to use silver alginate 12/24/16; she is still completing her antibiotics apparently not taking them exactly as prescribed. The wound is perhaps marginally smaller. Surface of this continues to look not 100% viable. We have been using silver collagen. Consider changing to Iodoflex 12/31/16; she is finished her antibiotics. There is no evidence to suspect ongoing infection in the wound. We have looked back over the pictures of the wound in the left popliteal fossa. She came in with  a thick adherent necrotic eschar and now we are dealing with slight surface slough. No debridement will be planned today. I'm going to try [facility] in lieu of the silver alginate starting today 01/07/17; patient continues to do well using  Hydrofera Blue. Reliably using her external compression pumps twice a day 01/15/17; the patient has been using [facility] with a general improvement in her wound condition and size. However she went back to work yesterday it would seem that her use of the compression pumps this week is been intermittent and at work she is unable to change the dressings. Per our intake nurse arrived with a macerated dressing in place. 01/22/17 on evaluation today patient appears to be doing a little bit worse. She is having more discomfort and there's a erythema surrounding the wound bed behind her left knee. She states this discomfort often is associated with infection and in fact this wound looks to likely be infected. She has no fevers, chills, nausea, vomiting, or diarrhea. 02/12/17; the patient's wound is not as good as I thought I would find it. She was making a good improvement when I saw her a month ago. She went back to work and largely she blames difficulties at work for this. Apparently during my absence she was treated with oral antibiotics starting on 8/3. I don't believe there are any cultures 02/19/17; patient arrives in clinic today with the wound is essentially unchanged in terms of dimensions and wound appearance. We have been using silver alginate. She claims compliance with the compression pumps twice a day 02/25/17; no major change in the condition of the wound. We used Iodo flex last week with the purpose of trying to get a better-looking surface. She could not tolerate this because of discomfort. She found this especially bad when she used her compression pumps. Before this we use silver alginate again with not a lot of success. I changed her to [employer] which will certainly not have much debridement activity however I will probably change to weekly mechanical debridements. This will help with the drainage. She is now at home/on short-term disability. 03/11/17; the patient is now on  disability. She is been using PolyMem AG wound for the first time looks a little better this week and dimensions have improved. She is compliant with her external compression pumps 03/18/16; the patient is now on disability at home using her palms. We've been using PolyMen AG. Wound certainly looks smaller. Arrives today with an adherent surface requiring debridement. 04/08/17; patient has not been here in 3 weeks. Using Anasept with PolyMem AG. She arrives today with an adherent surface over the wound. She did not wish debridement, there was edema fluid dripping through the wound surface even though she states she is using her external compression pumps 04/15/17; patient is using Anasept with PolyMem and AG. She arrives today telling us that her leg is been hurting since a home care nurse staff her dressing into her on Sunday therefore she could not use her compression pumps in spite of this her dimensions appear to be better although this is a very difficult wound to measure in the popliteal fossa of the left knee 04/22/17; arrives today in clinic using Anasept and PolyMem AG. She is using her compression pumps per her description. The wound surface however once again is not going to allow healing. She still has weeping edema fluid coming through the wound. No major change in dimensions 04/29/17; arrives today with the major deterioration in the  wound both in terms of dimensions and condition of the wound surface. She reports increasing pain since earlier in the week at our intake nurse reports purulent drainage on the ABD pads.this is not going in the right direction. We put Hydrofera Blue on this last week but I'm going to change back to [employer] because of the drainage. I reviewed previous cultures last done in June showed methicillin sensitive staph aureus. We had been making progress with this wound presenting initially with a necrotic wound on the popliteal fossa on the right. We  managed to get a viable surface and some reduction in wound dimensions however in the past 4 weeks this is been getting gradually worse and today much worse in terms of both size and condition of the wound surface. The patient is having pain and will require empiric antibiotics because of the purulent drainage and deterioration in the wound 05/20/17; I haven't seen this patient in 3 weeks. Biopsy of this wound I did last time suggested extensive granulation tissue with collections of neutrophils lymphocytes plasma cells histiocytes and multinucleated giant cells. There was no evidence of malignancy. Deep tissue culture I did showed MSSA and I gave her Keflex when she was here last time and renewed this for a week however she is still taking the medication I'm not certain she actually took it properly.the pathologist suggested the possibility of pyoderma gangrenosum, a vasculopathy. PAS stains and acid-fast stains were negative She also tells me that she is having mobility problems secondary to pain in both knees. She uses a walker but can barely make it to the bathroom. It sounds as though she has a history of chronic osteoarthritis and she tells me that she had a left anterior cruciate ligament tear at some time in the past 06/10/17; this is a patient that I haven't seen in 3 weeks. She is completed her antibiotics. Biopsy suggested the possibility of infection versus pyoderma gangrenosum. I gave her 2 weeks of Keflex directed at [facility] she is repeated this.she called last week to request more antibiotics but I wanted to see her. She arrives today with her aunt. Apparently the patient has deteriorated in terms of function. According the patient and her aunt she can no longer ambulate or easily transfer. She is incontinent because she has urge incontinence etc. They requested admission to a rehabilitation facility or the hospital and then a rehabilitation facility. I was not aware that the  patient was declining in terms a shin but according to our staff that's been the case they haven't seen her walk in the him any months that she's been here. I wasn't really aware of this. In fact the patient was working up until the end of August. In any case she arrives with the wound a lot worse 07/01/17; this is a patient I haven't seen in 3 weeks. The last time she was here at the request of her family member we sent her to the ER. She apparently was kept overnight. She was treated for UTI. Attempts were made to find her long-term care although there was insurance and cost issues and she is back home. She now has her aunt living with her. I am not clear what they are using to the wound on the posterior left knee/popliteal fossa. Last biopsy I did of this area did not document pyoderma which had been raised by another/previous biopsy. We are not clear today what they're actually putting on the wound 07/15/17; she arrives today with improvement  in her wound dimensions although there is still weeping edema fluid. She claims she is changing the dressing [silver alginate] twice daily. I've suggested increasing this to 3 times a day. She is changing the silver alginate twice a day 07/29/17; patient complains of pain drainage and odor coming from the wound which was different from 2 weeks ago.. We have been using silver alginate she has been compliant with her compression pumps 09/02/17; the patient continues to complain of pain and drainage. I gave her empiric antibiotics when she was last here 5 weeks ago but she says she only completed them recently. She seems confused about the time frame of her last visit here.she states she was concerned about an odor and stopped using the [facility] Blue on the wound in her left popliteal fossa about a week ago. I don't think she is dressing this with anything since. She is using her lymphedema pumps once a day.previous biopsy of done of this area showed  inflammation associated with stasis dermatitis or trauma She is still not walking apparently because of severe osteoarthritis in both knees. She thinks she'll be going back to work in June and she needs to be ambulatory although I really can't see this happening. 09/16/17; the patient is been using a mixture of Hydrofera Blue and PolyMen that she had left over. I'm not really sure how reliable this is. She tells me she is using her external compression pumps once a day. She had a steroid shot in her left knee this morning 09/30/17; 2 week follow-up for this patient with a difficult wound behind her left popliteal fossa. She has home health. They're using Hydrofera Blue. Measurement slightly smaller. She states she is using her compression pumps once a day 10/14/17; 2 week follow-up for this woman with a difficult wound with find her left popliteal fossa. She has home health. Using Hydrofera Blue. Measurements again today slightly smaller. Most of the surface of this covered in a nonviable tightly green adherent material. Some of this may be retained Hydrofera Blue nevertheless was debrided with great difficulty. She asked me about her upcoming disability which I think happens in June. I told her that from my point of view the wound itself does not qualify her to be disabled by itself. Now I'm aware that she can no longer stand and walk. This really deteriorated quite a bit during the course of the time she is been here. I suspect this is due to severe osteoarthritis of both knees and she is seen orthopedics for this. I think this is a source of her disability but I don't really feel comfortable with this given the fact that I don't precisely know the exact diagnosis here. 10/28/17; 2 week follow-up for this woman with a difficult wound in her left popliteal fossa. This is complicated by lymphedema. She is episodic in using her compression pumps we obtained. We've been using Hydrofera Blue. Dimensions  of the wound are not any different however surface of this looks a lot better than last time. Prior to last visit at which time I had the debridement the wound, dimensions were slightly smaller. The patient came in last time talking to me about disability. I sent her to see her primary physician who I thought would do a proper evaluation for this patient with regards to her disability thoughts. The patient is able to stand and transfer but is no longer ambulatory. She has a wheelchair at home.Marland Kitchen She works as a Freight forwarder for [employer]. She  can no longer drive. 11/08/17; this is a 2 week follow-up for patient with a difficult wound in the left popliteal fossa complicated by lymphedema. I am not sure about her compliance with the compression pumps we've been using Hydrofera Blue for 2 months now and we have been making decent progress however once again today she arrives with 50% of the overall wound area covered in tightly adherent black/green necrotic tissue. 12/02/17; difficult wound in the left popliteal fossa complicated by severe lymphedema. We've been using silver alginate although the patient ran out of this and she's been using Hydrofera Blue. Last time she is here she had a considerable amount of tightly adherent green necrotic tissue. Post debridement culture showed MSSA, I gave her 7 days worth of Septra DS which seems to have helped 12/24/17; almost palliative follow-up for this difficult area on the left popliteal fossa complicated by severe lymphedema. We've been using silver alginate although the patient tells me she's been using Hydrofera Blue. She does not separate the folds of this wound. She has been using her lymphedema pumps but not every day. 01/20/18 This is a difficult wound in the left popliteal fossa. Intake nurse noted some purulent drainage and necrotic material. She has severe lymphedema including the posterior thigh and the upper calf surrounding this wound and there is continued  leaking edema fluid out of the wound surface. This is not going to heal like this. She states she is being compliant with the lymphedema pumps once sometimes twice a day. However this is not controlling the edema around the wound either in the calf or the posterior thigh 02/10/18; really not making any progress here. The wound is actually larger. There is no purulent drainage. Culture I did last time was negative/multiple organisms 03/10/18; 1 month follow-up. The wound may be measures slightly smaller. We've been using care max directly on the wound surface. The area definitely looks less moist. She tells me she is also you been using her pumps religiously twice a day. In fact the area actually looks dry today. 04/12/2018; 1 month follow-up. She states after I used silver nitrate on this wound area a month ago that the pain was intolerable and continued all month. This made it too painful for her to use the care of max reliably or to use her compression pumps. She arrives in clinic today with her wound probably twice the size. There is no purulent drainage 05/03/2018; the patient has been using her pumps reliably twice a day over the last week but she finds the combination of the pump and a dressing on the wound to be unduly painful therefore there is not been a dressing in the last week. She arrives in clinic today with the wound continuing to enlarge. She did not tolerate silver nitrate nor Kerramax directly on the wound 05/26/2018; the patient has not been using her pumps. Her wound in the left posterior popliteal fossa continues to expand. We are using silver alginate although the options for primary dressings are limited since she is paying out- of-pocket for this. Culture I did last time which was a PCR culture showed a scattered number of anaerobes low quantities. I did not think this was significant. 06/09/2018 the patient has not been using her pumps. She puts care of max in the area on  the posterior popliteal fossa. She has no one to help her clean this area. Home health is apparently coming by once a week. For some reason she has to purchase  a lot of her supplies herself online. She does not have a lot of financial resources. She states the pumps hurt her and caused the wound to bleed. She is continuing to deteriorate Biopsy I did last visit did not show malignancy/fungus or atypical Mycobacterium. This is a second time I have biopsied this 06/30/2018; the patient states she is using her pumps at least once a day. She put silver alginate and a rolled towel in the area. She occasionally uses kerramax and/or ABD pads. She is decided that a application of silver nitrate I did because of very friable mucosa on this sometime in September is the reason that she could not use the pumps. I did not actually research this. This is clearly not the case 1/30; the patient states she is using her pumps once a day but sometimes for as long as 2 hours. I told her that she still needs to do this and additional time a day. She is using silver alginate. She states the [facility] hurts when she uses her pumps therefore she is not using this. She has ABD pads that she is getting from home health. Dimensions perhaps slightly improved 3/5; the patient uses her pumps once a day but she states for is sometimes as long as 2 hours. She has not managed to do this twice a day. She is using silver alginate. Separating these areas with ABDs and rolled towels. There is no change in dimensions of this wound if anything slightly larger although the surfaces look better 4/2; I do not see too much difference 1 way or the other in the area on the left popliteal fossa. She is not using her pumps. She did describe an episode of pain and odor a week or 2 ago however she said it is mostly subsided. Still using silver alginate. 5/8; monthly follow-up for this large area in the left popliteal fossa. She is not using her  compression pumps there is surrounding lymphedema from tissue in the posterior thigh in the posterior calf. Her home health nurse had called 2 weeks ago to report black drainage on the surface, she used silver alginate after that and it seems to have cleared up. 6/4; Patient is not using pumps. Finds silver alginate painful. I think she is just using ABD's in the area. She has a new wound laterally to the original wound. Leaking copious lymphedema 7/9; monthly follow-up. Patient is using her pumps about once a week finds it painful. She uses some combination of silver calcium alginate. She has to [place] her [place] and she uses an ABD. She has home health but her co-pay is such that it is cheaper for her to buy her supplies online 8/6-Patient is here for her monthly follow-up, she has started using her pumps again, apparently a couple of weeks ago she had a lot of pain and could not use the pumps, she is now using silver alginate, she states her left posterior calf wound had some drainage that was greenish now converted to serous 9/10- Here for monthly follow up Objective Constitutional alert and oriented x 3. sitting or standing blood pressure is within target range for patient.. supine blood pressure is within target range for patient.. pulse regular and within target range for patient.Marland Kitchen respirations regular, non-labored and within target range for patient.Marland Kitchen temperature within target range for patient.. Well-nourished and well-hydrated in no acute distress. Vitals Time Taken: 11:57 AM, Height: 70 in, Weight: 484 lbs, BMI: 69.4, Temperature: 98.3 F,  Pulse: 106 bpm, Respiratory Rate: 22 breaths/min, Blood Pressure: 161/56 mmHg. General Notes: Noted Left posterior Calf wound, stable Integumentary (Hair, Skin) Wound #1 status is Open. Original cause of wound was Gradually Appeared. The wound is located on the Left,Posterior Knee. The wound measures 5cm length x 6cm width x 0.1cm  depth; 23.562cm^2 area and 2.356cm^3 volume. There is Fat Layer (Subcutaneous Tissue) Exposed exposed. There is no tunneling or undermining noted. There is a large amount of serosanguineous drainage noted. The wound margin is flat and intact. There is large (67- 100%) **red granulation within the wound bed. There is no necrotic tissue within the wound bed. Wound #3 status is Open. Original cause of wound was Gradually Appeared. The wound is located on the Left,Medial Lower Leg. The wound measures 1cm length x 2cm width x 0.1cm depth; 1.571cm^2 area and 0.157cm^3 volume. There is Fat Layer (Subcutaneous Tissue) Exposed exposed. There is no tunneling or undermining noted. There is a small amount of serous drainage noted. The wound margin is thickened. There is large (67-100%) pink granulation within the wound bed. There is no necrotic tissue within the wound bed. Assessment Active Problems ICD-10 Chronic venous hypertension (idiopathic) with inflammation of left lower extremity Lymphedema, not elsewhere classified Non-pressure chronic ulcer of left calf limited to breakdown of skin Type 2 diabetes mellitus with other skin ulcer Other obesity due to excess calories Plan Follow-up Appointments: Return appointment in 1 month. Dressing Change Frequency: Wound #1 Left,Posterior Knee: Change dressing every day. Wound Cleansing: May shower and wash wound with soap and water. - also cleanse with Vashe solution Primary Wound Dressing: Wound #1 Left,Posterior Knee: Calcium Alginate with Silver Wound #3 Left,Medial Lower Leg: Calcium Alginate with Silver Secondary Dressing: Wound #1 Left,Posterior Knee: Kerlix/Rolled Gauze ABD pad - or incontinence pads secure with transpore tape Edema Control: Avoid standing for long periods of time Elevate legs to the level of the heart or above for 30 minutes daily and/or when sitting, a frequency of: - throughout the day Exercise regularly Segmental  Compressive Device. - lymphedema pumps 60 minutes 2 times per day! Off-Loading: Other: - place rolled towel or washcloth in skin fold behind left knee to separate skin in fold Additional Orders / Instructions: Follow Nutritious Diet - increase protein and vegetables to aid in wound healing. Home Health: [facility] for [facility]. - [place]. 2 times per week; patient is homebound. Continue Dressing changes every day Left posterior knee, continue using Silver alginate return to clinic 1 month Electronic Signature(s) Signed: 05/10/2019 2:34:38 PM By: Tobi Bastos MD, MBA Entered By: Tobi Bastos on 05/10/2019 14:34:38 -------------------------------------------------------------------------------- SuperBill Details Patient Name: Date of Service: Danna Hefty 03/02/2019 Medical Record [id number] Patient Account Number: [id number] Date of Birth/Sex: Treating RN: Oct 30, 1965 (53 y.o. Helene Shoe, Meta.Reding Primary Care Provider: Fanny Bien Other Clinician: Referring Provider: Treating Provider/Extender:Brant Peets, Allayne Gitelman, Rojelio Brenner in Treatment: 126 Diagnosis Coding ICD-10 Codes Code Description I87.322 Chronic venous hypertension (idiopathic) with inflammation of left lower extremity I89.0 Lymphedema, not elsewhere classified L97.221 Non-pressure chronic ulcer of left calf limited to breakdown of skin E11.622 Type 2 diabetes mellitus with other skin ulcer E66.09 Other obesity due to excess calories Facility Procedures CPT4 Code: [id number] Description: 99214 - WOUND CARE VISIT-LEV 4 EST PT Modifier: Quantity: 1 Physician Procedures CPT4 Code Description: [id number] - WC PHYS LEVEL 2 - EST PT ICD-10 Diagnosis Description L97.221 Non-pressure chronic ulcer of left calf limited to breakd Modifier: own of  skin Quantity: 1 Electronic Signature(s) Signed: 05/10/2019 2:35:54 PM By: Tobi Bastos MD, MBA Previous  Signature: 03/02/2019 4:47:54 PM Version By: Tobi Bastos MD, MBA Previous Signature: 03/02/2019 6:15:18 PM Version By: Deon Pilling Entered By: Tobi Bastos on 05/10/2019 14:35:51

## 2019-05-30 ENCOUNTER — Encounter (HOSPITAL_BASED_OUTPATIENT_CLINIC_OR_DEPARTMENT_OTHER): Payer: BLUE CROSS/BLUE SHIELD | Attending: Internal Medicine | Admitting: Internal Medicine

## 2019-05-30 ENCOUNTER — Other Ambulatory Visit: Payer: Self-pay

## 2019-05-30 DIAGNOSIS — N3941 Urge incontinence: Secondary | ICD-10-CM | POA: Diagnosis not present

## 2019-05-30 DIAGNOSIS — Z6841 Body Mass Index (BMI) 40.0 and over, adult: Secondary | ICD-10-CM | POA: Diagnosis not present

## 2019-05-30 DIAGNOSIS — S81802A Unspecified open wound, left lower leg, initial encounter: Secondary | ICD-10-CM | POA: Diagnosis not present

## 2019-05-30 DIAGNOSIS — M170 Bilateral primary osteoarthritis of knee: Secondary | ICD-10-CM | POA: Insufficient documentation

## 2019-05-30 DIAGNOSIS — L97221 Non-pressure chronic ulcer of left calf limited to breakdown of skin: Secondary | ICD-10-CM | POA: Diagnosis not present

## 2019-05-30 DIAGNOSIS — I890 Lymphedema, not elsewhere classified: Secondary | ICD-10-CM | POA: Diagnosis not present

## 2019-05-30 DIAGNOSIS — E11622 Type 2 diabetes mellitus with other skin ulcer: Secondary | ICD-10-CM | POA: Diagnosis not present

## 2019-05-30 DIAGNOSIS — S81002A Unspecified open wound, left knee, initial encounter: Secondary | ICD-10-CM | POA: Diagnosis not present

## 2019-05-30 NOTE — Progress Notes (Signed)
MEREDETH, Sierra ([id number]) Visit Report for 04/04/2019 HPI Details Patient Name: Date of Service: [name], [name] 04/04/2019 2:15 PM Medical Record [id number] Patient Account Number: [id number] Date of Birth/Sex: Treating RN: [birth year] (53 y.o. [name] Primary Care Provider: Fanny Bien Other Clinician: Referring Provider: Treating Provider/Extender:Danell Vazquez, Gennette Pac, Rojelio Brenner in Treatment: 130 History of Present Illness Location: Left popliteal fossa Duration: She was aware of the wound October 2017 Context: The development/occurrence is unclear Modifying Factors: Chronic pressure secondary to thigh pannus HPI Description: 10/01/16- She is here for initial evaluation of her left popliteal fossa ulcer. She states that she noticed the area in October 2017. She is unaware of actual causative factor, states she has a history of eczema and uses a brush for bathing. She was being treated by her PCP, using Aquaphor since then, and stopped using last week. She denies ever being on an antibiotic for this ulcer. She states that this drains excessively and soaks through her clothes, she is unable to visualize this area and has a friend that helps with the dressing changes. She was recently diagnosed with diabetes; A1c in December was 9; down to 7.5-8 in February. She is being managed on oral agents. She is a non-smoker. She has never been evaluated for venous reflux or lymphedema. 10/08/16- She is here in follow up regarding her left popliteal fossa pressure ulcer. She states that she did not receive any supplies as ordered. She states that the dressing material that was ordered and placed last week was in place for several days. She states that the odor and drainage is unchanged. She states that her PCP has written for 1 month off work, starting Monday 4/23. The culture obtained last week was mixed skin flora, no identifiable pathogen. 10/15/16;  difficult wound in the left popliteal fossa in the setting of morbid obesity and lymphedema. We have been using silver alginate although it is not really clear to me that this stays on for very long. She changes it once. Covering with AVD pads to try and separate wound from the surrounding pannus. A culture of this area showed both enterococcus and Enterobacter and I have her on a combination of amoxicillin and cipro. Although most Gram positives will be covered by Cipro, a big exception is Enterobacter particularly Enterobacter faecalis which generally requires ampicillin if indeed it is sensitive. A final issue is that the patient is limited by the co-pay on her insurance with regards to actual wound care supplies. I didn't get into this in much detail although he crossed my mind that we may need to bring her back more than once a week for dressing changes. 10/22/16; difficult wound in the left popliteal fossa. I think this is a lymphedema type issue in the middle lobe folds in the back of her knee. We have been using silver alginate, ABD pads. Dimensions today are improved, base of the wound is improved. I had some thoughts about biopsying this however overall this is improved today and all put that off for now 10/29/16; difficult wound in the left popliteal fossa which I think is a pressure wound from associated large folds of lymphedema. We have been using silver alginate. 11/05/16; difficult wound in the left popliteal fossa. Using silver alginate change 3x/wk. Wound measuring slightly smaller 11/12/16; difficult wound in the setting of chronic lymphedema. Left popliteal fossa. Using so over alginate change 3 times a week, once here and wants by home health and/or the patient's friend who  is a Marine scientist. The dimensions of this appear to be improving, the surface is certainly improved [id number]; Patient now has lymphedema pumps. wound is slightly reduced in width. Still using silver alginate 11/26/16;  patient has lymphedema pumps although she is having trouble with them in terms of fit. Nevertheless she tells me she is using them. She is still using silver alginate as the primary dressing 12/03/16; patient has lymphedema pumps and there is been significant improvement in the swelling in the leg. We've been using silver alginate but really only making minimal improvements if any in the overall wound area although the base of the wound is a lot better than when she first came here. 12/10/16; patient has increasing pain, drainage and odor this week. States this began to get worse over the weekend. She is been using her pumps at home and the edema is certainly better in the leg. We changed to [facility] Blue last week. She has home health coming out once a week to change the dressing 12/17/16; culture I did last week showed a few MSSA. We called her in cephalexin at 500 4 times a day. She admits to less than 100% compliance with the 4 times a day dosing. I have encouraged her today. The surrounding tenderness and erythema in this wound is a lot better. Nursing reports that the diameter of the wound is down a centimeter. Healthy-looking wound base no surrounding tenderness. We continue to use silver alginate 12/24/16; she is still completing her antibiotics apparently not taking them exactly as prescribed. The wound is perhaps marginally smaller. Surface of this continues to look not 100% viable. We have been using silver collagen. Consider changing to Iodoflex 12/31/16; she is finished her antibiotics. There is no evidence to suspect ongoing infection in the wound. We have looked back over the pictures of the wound in the left popliteal fossa. She came in with a thick adherent necrotic eschar and now we are dealing with slight surface slough. No debridement will be planned today. I'm going to try [facility] in lieu of the silver alginate starting today 01/07/17; patient continues to do well using  Hydrofera Blue. Reliably using her external compression pumps twice a day 01/15/17; the patient has been using [facility] with a general improvement in her wound condition and size. However she went back to work yesterday it would seem that her use of the compression pumps this week is been intermittent and at work she is unable to change the dressings. Per our intake nurse arrived with a macerated dressing in place. 01/22/17 on evaluation today patient appears to be doing a little bit worse. She is having more discomfort and there's a erythema surrounding the wound bed behind her left knee. She states this discomfort often is associated with infection and in fact this wound looks to likely be infected. She has no fevers, chills, nausea, vomiting, or diarrhea. 02/12/17; the patient's wound is not as good as I thought I would find it. She was making a good improvement when I saw her a month ago. She went back to work and largely she blames difficulties at work for this. Apparently during my absence she was treated with oral antibiotics starting on 8/3. I don't believe there are any cultures 02/19/17; patient arrives in clinic today with the wound is essentially unchanged in terms of dimensions and wound appearance. We have been using silver alginate. She claims compliance with the compression pumps twice a day 02/25/17; no major change in the  condition of the wound. We used Iodo flex last week with the purpose of trying to get a better-looking surface. She could not tolerate this because of discomfort. She found this especially bad when she used her compression pumps. Before this we use silver alginate again with not a lot of success. I changed her to [employer] which will certainly not have much debridement activity however I will probably change to weekly mechanical debridements. This will help with the drainage. She is now at home/on short-term disability. 03/11/17; the patient is now on  disability. She is been using PolyMem AG wound for the first time looks a little better this week and dimensions have improved. She is compliant with her external compression pumps 03/18/16; the patient is now on disability at home using her palms. We've been using PolyMen AG. Wound certainly looks smaller. Arrives today with an adherent surface requiring debridement. 04/08/17; patient has not been here in 3 weeks. Using Anasept with PolyMem AG. She arrives today with an adherent surface over the wound. She did not wish debridement, there was edema fluid dripping through the wound surface even though she states she is using her external compression pumps 04/15/17; patient is using Anasept with PolyMem and AG. She arrives today telling us that her leg is been hurting since a home care nurse staff her dressing into her on Sunday therefore she could not use her compression pumps in spite of this her dimensions appear to be better although this is a very difficult wound to measure in the popliteal fossa of the left knee 04/22/17; arrives today in clinic using Anasept and PolyMem AG. She is using her compression pumps per her description. The wound surface however once again is not going to allow healing. She still has weeping edema fluid coming through the wound. No major change in dimensions 04/29/17; arrives today with the major deterioration in the wound both in terms of dimensions and condition of the wound surface. She reports increasing pain since earlier in the week at our intake nurse reports purulent drainage on the ABD pads.this is not going in the right direction. We put Hydrofera Blue on this last week but I'm going to change back to [employer] because of the drainage. I reviewed previous cultures last done in June showed methicillin sensitive staph aureus. We had been making progress with this wound presenting initially with a necrotic wound on the popliteal fossa on the right. We  managed to get a viable surface and some reduction in wound dimensions however in the past 4 weeks this is been getting gradually worse and today much worse in terms of both size and condition of the wound surface. The patient is having pain and will require empiric antibiotics because of the purulent drainage and deterioration in the wound 05/20/17; I haven't seen this patient in 3 weeks. Biopsy of this wound I did last time suggested extensive granulation tissue with collections of neutrophils lymphocytes plasma cells histiocytes and multinucleated giant cells. There was no evidence of malignancy. Deep tissue culture I did showed MSSA and I gave her Keflex when she was here last time and renewed this for a week however she is still taking the medication I'm not certain she actually took it properly.the pathologist suggested the possibility of pyoderma gangrenosum, a vasculopathy. PAS stains and acid-fast stains were negative She also tells me that she is having mobility problems secondary to pain in both knees. She uses a walker but can barely make it to  the bathroom. It sounds as though she has a history of chronic osteoarthritis and she tells me that she had a left anterior cruciate ligament tear at some time in the past 06/10/17; this is a patient that I haven't seen in 3 weeks. She is completed her antibiotics. Biopsy suggested the possibility of infection versus pyoderma gangrenosum. I gave her 2 weeks of Keflex directed at [facility] she is repeated this.she called last week to request more antibiotics but I wanted to see her. She arrives today with her aunt. Apparently the patient has deteriorated in terms of function. According the patient and her aunt she can no longer ambulate or easily transfer. She is incontinent because she has urge incontinence etc. They requested admission to a rehabilitation facility or the hospital and then a rehabilitation facility. I was not aware that the  patient was declining in terms a shin but according to our staff that's been the case they haven't seen her walk in the him any months that she's been here. I wasn't really aware of this. In fact the patient was working up until the end of August. In any case she arrives with the wound a lot worse 07/01/17; this is a patient I haven't seen in 3 weeks. The last time she was here at the request of her family member we sent her to the ER. She apparently was kept overnight. She was treated for UTI. Attempts were made to find her long-term care although there was insurance and cost issues and she is back home. She now has her aunt living with her. I am not clear what they are using to the wound on the posterior left knee/popliteal fossa. Last biopsy I did of this area did not document pyoderma which had been raised by another/previous biopsy. We are not clear today what they're actually putting on the wound 07/15/17; she arrives today with improvement in her wound dimensions although there is still weeping edema fluid. She claims she is changing the dressing [silver alginate] twice daily. I've suggested increasing this to 3 times a day. She is changing the silver alginate twice a day 07/29/17; patient complains of pain drainage and odor coming from the wound which was different from 2 weeks ago.. We have been using silver alginate she has been compliant with her compression pumps 09/02/17; the patient continues to complain of pain and drainage. I gave her empiric antibiotics when she was last here 5 weeks ago but she says she only completed them recently. She seems confused about the time frame of her last visit here.she states she was concerned about an odor and stopped using the [facility] Blue on the wound in her left popliteal fossa about a week ago. I don't think she is dressing this with anything since. She is using her lymphedema pumps once a day.previous biopsy of done of this area showed  inflammation associated with stasis dermatitis or trauma She is still not walking apparently because of severe osteoarthritis in both knees. She thinks she'll be going back to work in June and she needs to be ambulatory although I really can't see this happening. 09/16/17; the patient is been using a mixture of Hydrofera Blue and PolyMen that she had left over. I'm not really sure how reliable this is. She tells me she is using her external compression pumps once a day. She had a steroid shot in her left knee this morning 09/30/17; 2 week follow-up for this patient with a difficult wound behind her left  popliteal fossa. She has home health. They're using Hydrofera Blue. Measurement slightly smaller. She states she is using her compression pumps once a day 10/14/17; 2 week follow-up for this woman with a difficult wound with find her left popliteal fossa. She has home health. Using Hydrofera Blue. Measurements again today slightly smaller. Most of the surface of this covered in a nonviable tightly green adherent material. Some of this may be retained Hydrofera Blue nevertheless was debrided with great difficulty. She asked me about her upcoming disability which I think happens in June. I told her that from my point of view the wound itself does not qualify her to be disabled by itself. Now I'm aware that she can no longer stand and walk. This really deteriorated quite a bit during the course of the time she is been here. I suspect this is due to severe osteoarthritis of both knees and she is seen orthopedics for this. I think this is a source of her disability but I don't really feel comfortable with this given the fact that I don't precisely know the exact diagnosis here. 10/28/17; 2 week follow-up for this woman with a difficult wound in her left popliteal fossa. This is complicated by lymphedema. She is episodic in using her compression pumps we obtained. We've been using Hydrofera Blue. Dimensions  of the wound are not any different however surface of this looks a lot better than last time. Prior to last visit at which time I had the debridement the wound, dimensions were slightly smaller. The patient came in last time talking to me about disability. I sent her to see her primary physician who I thought would do a proper evaluation for this patient with regards to her disability thoughts. The patient is able to stand and transfer but is no longer ambulatory. She has a wheelchair at home.Marland Kitchen She works as a Freight forwarder for [employer]. She can no longer drive. 11/08/17; this is a 2 week follow-up for patient with a difficult wound in the left popliteal fossa complicated by lymphedema. I am not sure about her compliance with the compression pumps we've been using Hydrofera Blue for 2 months now and we have been making decent progress however once again today she arrives with 50% of the overall wound area covered in tightly adherent black/green necrotic tissue. 12/02/17; difficult wound in the left popliteal fossa complicated by severe lymphedema. We've been using silver alginate although the patient ran out of this and she's been using Hydrofera Blue. Last time she is here she had a considerable amount of tightly adherent green necrotic tissue. Post debridement culture showed MSSA, I gave her 7 days worth of Septra DS which seems to have helped 12/24/17; almost palliative follow-up for this difficult area on the left popliteal fossa complicated by severe lymphedema. We've been using silver alginate although the patient tells me she's been using Hydrofera Blue. She does not separate the folds of this wound. She has been using her lymphedema pumps but not every day. 01/20/18 This is a difficult wound in the left popliteal fossa. Intake nurse noted some purulent drainage and necrotic material. She has severe lymphedema including the posterior thigh and the upper calf surrounding this wound and there is continued  leaking edema fluid out of the wound surface. This is not going to heal like this. She states she is being compliant with the lymphedema pumps once sometimes twice a day. However this is not controlling the edema around the wound either in the calf or  the posterior thigh 02/10/18; really not making any progress here. The wound is actually larger. There is no purulent drainage. Culture I did last time was negative/multiple organisms 03/10/18; 1 month follow-up. The wound may be measures slightly smaller. We've been using care max directly on the wound surface. The area definitely looks less moist. She tells me she is also you been using her pumps religiously twice a day. In fact the area actually looks dry today. 04/12/2018; 1 month follow-up. She states after I used silver nitrate on this wound area a month ago that the pain was intolerable and continued all month. This made it too painful for her to use the care of max reliably or to use her compression pumps. She arrives in clinic today with her wound probably twice the size. There is no purulent drainage 05/03/2018; the patient has been using her pumps reliably twice a day over the last week but she finds the combination of the pump and a dressing on the wound to be unduly painful therefore there is not been a dressing in the last week. She arrives in clinic today with the wound continuing to enlarge. She did not tolerate silver nitrate nor Kerramax directly on the wound 05/26/2018; the patient has not been using her pumps. Her wound in the left posterior popliteal fossa continues to expand. We are using silver alginate although the options for primary dressings are limited since she is paying out- of-pocket for this. Culture I did last time which was a PCR culture showed a scattered number of anaerobes low quantities. I did not think this was significant. 06/09/2018 the patient has not been using her pumps. She puts care of max in the area on  the posterior popliteal fossa. She has no one to help her clean this area. Home health is apparently coming by once a week. For some reason she has to purchase a lot of her supplies herself online. She does not have a lot of financial resources. She states the pumps hurt her and caused the wound to bleed. She is continuing to deteriorate Biopsy I did last visit did not show malignancy/fungus or atypical Mycobacterium. This is a second time I have biopsied this 06/30/2018; the patient states she is using her pumps at least once a day. She put silver alginate and a rolled towel in the area. She occasionally uses kerramax and/or ABD pads. She is decided that a application of silver nitrate I did because of very friable mucosa on this sometime in September is the reason that she could not use the pumps. I did not actually research this. This is clearly not the case 1/30; the patient states she is using her pumps once a day but sometimes for as long as 2 hours. I told her that she still needs to do this and additional time a day. She is using silver alginate. She states the [facility] hurts when she uses her pumps therefore she is not using this. She has ABD pads that she is getting from home health. Dimensions perhaps slightly improved 3/5; the patient uses her pumps once a day but she states for is sometimes as long as 2 hours. She has not managed to do this twice a day. She is using silver alginate. Separating these areas with ABDs and rolled towels. There is no change in dimensions of this wound if anything slightly larger although the surfaces look better 4/2; I do not see too much difference 1 way or the other in  the area on the left popliteal fossa. She is not using her pumps. She did describe an episode of pain and odor a week or 2 ago however she said it is mostly subsided. Still using silver alginate. 5/8; monthly follow-up for this large area in the left popliteal fossa. She is not using her  compression pumps there is surrounding lymphedema from tissue in the posterior thigh in the posterior calf. Her home health nurse had called 2 weeks ago to report black drainage on the surface, she used silver alginate after that and it seems to have cleared up. 6/4; Patient is not using pumps. Finds silver alginate painful. I think she is just using ABD's in the area. She has a new wound laterally to the original wound. Leaking copious lymphedema 7/9; monthly follow-up. Patient is using her pumps about once a week finds it painful. She uses some combination of silver calcium alginate. She has to buy her own supplies [place] and she uses an ABD. She has home health but her co-pay is such that it is cheaper for her to buy her supplies online 8/6-Patient is here for her monthly follow-up, she has started using her pumps again, apparently a couple of weeks ago she had a lot of pain and could not use the pumps, she is now using silver alginate, she states her left posterior calf wound had some drainage that was greenish now converted to serous 10/13; 47-month follow-up. The patient's wound is on the left popliteal fossa. Fairly substantial area in the middle of 2 pannus folds the lower part of her thigh and the upper part of her calf. She has been using silver alginate. As usual she presents with greenish drainage but the wound does not look infected Electronic Signature(s) Signed: 04/04/2019 5:42:33 PM By: Linton Ham MD Entered By: Linton Ham on 04/04/2019 17:04:17 -------------------------------------------------------------------------------- Physical Exam Details Patient Name: Date of Service: [name] 04/04/2019 2:15 PM Medical Record [id number] Patient Account Number: [id number] Date of Birth/Sex: Treating RN: June 15, 1966 (53 y.o. [name] Primary Care Provider: Fanny Bien Other Clinician: Referring Provider: Treating Provider/Extender:Haniah Penny,  Gennette Pac, Rojelio Brenner in Treatment: 130 Constitutional Patient is hypertensive.. Pulse regular and within target range for patient.Marland Kitchen Respirations regular, non-labored and within target range.. Temperature is normal and within the target range for the patient.Marland Kitchen Appears in no distress. Eyes Conjunctivae clear. No discharge.no icterus. Respiratory work of breathing is normal. Cardiovascular It will pulses are palpable. Musculoskeletal Osteoarthritis of both knees. Integumentary (Hair, Skin) Severe bilateral lymphedema. Changes of chronic hemosiderin deposition. Psychiatric appears at normal baseline. Notes Wound exam; substantial area of the left popliteal fossa. There is no evidence of infection here. The wound is certainly not smaller. No gross infection no tenderness. She has poor control of lymphedema around this. There is no evidence of infection Electronic Signature(s) Signed: 04/04/2019 5:42:33 PM By: Linton Ham MD Entered By: Linton Ham on 04/04/2019 17:05:48 -------------------------------------------------------------------------------- Physician Orders Details Patient Name: Date of Service: [name] 04/04/2019 2:15 PM Medical Record [id number] Patient Account Number: [id number] Date of Birth/Sex: Treating RN: 09/09/1965 (53 y.o. [name] Primary Care Provider: Fanny Bien Other Clinician: Referring Provider: Treating Provider/Extender:Amer Alcindor, Gennette Pac, Rojelio Brenner in Treatment: [phone] Verbal / Phone Orders: No Diagnosis Coding ICD-10 Coding Code Description I87.322 Chronic venous hypertension (idiopathic) with inflammation of left lower extremity I89.0 Lymphedema, not elsewhere classified L97.221 Non-pressure chronic ulcer of left calf limited to breakdown of skin E11.622 Type 2 diabetes  mellitus with other skin ulcer E66.09 Other obesity due to excess calories Follow-up Appointments Other: - 2  months Dressing Change Frequency Wound #1 Left,Posterior Knee Change dressing every day. Wound Cleansing May shower and wash wound with soap and water. - also cleanse with Vashe solution Primary Wound Dressing Wound #1 Left,Posterior Knee Calcium Alginate with Silver Wound #3 Left,Medial Lower Leg Calcium Alginate with Silver Secondary Dressing Wound #1 Left,Posterior Knee Kerlix/Rolled Gauze ABD pad - or incontinence pads secure with transpore tape Edema Control Avoid standing for long periods of time Elevate legs to the level of the heart or above for 30 minutes daily and/or when sitting, a frequency of: - throughout the day Exercise regularly Segmental Compressive Device. - lymphedema pumps 60 minutes 2 times per day! Off-Loading Other: - place rolled towel or washcloth in skin fold behind left knee to separate skin in fold Additional Orders / Instructions Follow Nutritious Diet - increase protein and vegetables to aid in wound healing. [place] skilled nursing for wound care. - [place]. 2 times per week; patient is homebound. Electronic Signature(s) Signed: 04/04/2019 5:42:33 PM By: Linton Ham MD Signed: 05/30/2019 3:01:15 PM By: Carlene Coria RN Entered By: Carlene Coria on 04/04/2019 16:14:11 -------------------------------------------------------------------------------- Problem List Details Patient Name: Date of Service: [name] 04/04/2019 2:15 PM Medical Record [id number] Patient Account Number: [id number] Date of Birth/Sex: Treating RN: Jan 03, 1966 (53 y.o. [name] Primary Care Provider: Fanny Bien Other Clinician: Referring Provider: Treating Provider/Extender:Joplin Canty, Gennette Pac, Rojelio Brenner in Treatment: 130 Active Problems ICD-10 Evaluated Encounter Code Description Active Date Today Diagnosis I87.322 Chronic venous hypertension (idiopathic) with 11/05/2016 No Yes inflammation  of left lower extremity I89.0 Lymphedema, not elsewhere classified 10/09/2016 No Yes L97.221 Non-pressure chronic ulcer of left calf limited to 11/05/2016 No Yes breakdown of skin E11.622 Type 2 diabetes mellitus with other skin ulcer 10/01/2016 No Yes E66.09 Other obesity due to excess calories 10/01/2016 No Yes Inactive Problems Resolved Problems Electronic Signature(s) Signed: 04/04/2019 5:42:33 PM By: Linton Ham MD Entered By: Linton Ham on 04/04/2019 17:00:45 -------------------------------------------------------------------------------- Progress Note Details Patient Name: Date of Service: [name] 04/04/2019 2:15 PM Medical Record [id number] Patient Account Number: [id number] Date of Birth/Sex: Treating RN: [birth year] (53 y.o. [name] Primary Care Provider: Fanny Bien Other Clinician: Referring Provider: Treating Provider/Extender:Enola Siebers, Gennette Pac, Rojelio Brenner in Treatment: 130 Subjective History of Present Illness (HPI) The following HPI elements were documented for the patient's wound: Location: Left popliteal fossa Duration: She was aware of the wound October 2017 Context: The development/occurrence is unclear Modifying Factors: Chronic pressure secondary to thigh pannus 10/01/16- She is here for initial evaluation of her left popliteal fossa ulcer. She states that she noticed the area in October 2017. She is unaware of actual causative factor, states she has a history of eczema and uses a brush for bathing. She was being treated by her PCP, using Aquaphor since then, and stopped using last week. She denies ever being on an antibiotic for this ulcer. She states that this drains excessively and soaks through her clothes, she is unable to visualize this area and has a friend that helps with the dressing changes. She was recently diagnosed with diabetes; A1c in December was 9; down to 7.5-8 in February. She is being managed  on oral agents. She is a non-smoker. She has never been evaluated for venous reflux or lymphedema. 10/08/16- She is here in follow up regarding her left popliteal  fossa pressure ulcer. She states that she did not receive any supplies as ordered. She states that the dressing material that was ordered and placed last week was in place for several days. She states that the odor and drainage is unchanged. She states that her PCP has written for 1 month off work, starting Monday 4/23. The culture obtained last week was mixed skin flora, no identifiable pathogen. 10/15/16; difficult wound in the left popliteal fossa in the setting of morbid obesity and lymphedema. We have been using silver alginate although it is not really clear to me that this stays on for very long. She changes it once. Covering with AVD pads to try and separate wound from the surrounding pannus. A culture of this area showed both enterococcus and Enterobacter and I have her on a combination of amoxicillin and cipro. Although most Gram positives will be covered by Cipro, a big exception is Enterobacter particularly Enterobacter faecalis which generally requires ampicillin if indeed it is sensitive. A final issue is that the patient is limited by the co-pay on her insurance with regards to actual wound care supplies. I didn't get into this in much detail although he crossed my mind that we may need to bring her back more than once a week for dressing changes. 10/22/16; difficult wound in the left popliteal fossa. I think this is a lymphedema type issue in the middle lobe folds in the back of her knee. We have been using silver alginate, ABD pads. Dimensions today are improved, base of the wound is improved. I had some thoughts about biopsying this however overall this is improved today and all put that off for now 10/29/16; difficult wound in the left popliteal fossa which I think is a pressure wound from associated large folds  of lymphedema. We have been using silver alginate. 11/05/16; difficult wound in the left popliteal fossa. Using silver alginate change 3x/wk. Wound measuring slightly smaller 11/12/16; difficult wound in the setting of chronic lymphedema. Left popliteal fossa. Using so over alginate change 3 times a week, once here and wants by home health and/or the patient's friend who is a Marine scientist. The dimensions of this appear to be improving, the surface is certainly improved [id number]; Patient now has lymphedema pumps. wound is slightly reduced in width. Still using silver alginate 11/26/16; patient has lymphedema pumps although she is having trouble with them in terms of fit. Nevertheless she tells me she is using them. She is still using silver alginate as the primary dressing 12/03/16; patient has lymphedema pumps and there is been significant improvement in the swelling in the leg. We've been using silver alginate but really only making minimal improvements if any in the overall wound area although the base of the wound is a lot better than when she first came here. 12/10/16; patient has increasing pain, drainage and odor this week. States this began to get worse over the weekend. She is been using her pumps at home and the edema is certainly better in the leg. We changed to [facility] Blue last week. She has home health coming out once a week to change the dressing 12/17/16; culture I did last week showed a few MSSA. We called her in cephalexin at 500 4 times a day. She admits to less than 100% compliance with the 4 times a day dosing. I have encouraged her today. The surrounding tenderness and erythema in this wound is a lot better. Nursing reports that the diameter of the wound is down a  centimeter. Healthy-looking wound base no surrounding tenderness. We continue to use silver alginate 12/24/16; she is still completing her antibiotics apparently not taking them exactly as prescribed. The wound is perhaps  marginally smaller. Surface of this continues to look not 100% viable. We have been using silver collagen. Consider changing to Iodoflex 12/31/16; she is finished her antibiotics. There is no evidence to suspect ongoing infection in the wound. We have looked back over the pictures of the wound in the left popliteal fossa. She came in with a thick adherent necrotic eschar and now we are dealing with slight surface slough. No debridement will be planned today. I'm going to try [facility] in lieu of the silver alginate starting today 01/07/17; patient continues to do well using Hydrofera Blue. Reliably using her external compression pumps twice a day 01/15/17; the patient has been using [facility] with a general improvement in her wound condition and size. However she went back to work yesterday it would seem that her use of the compression pumps this week is been intermittent and at work she is unable to change the dressings. Per our intake nurse arrived with a macerated dressing in place. 01/22/17 on evaluation today patient appears to be doing a little bit worse. She is having more discomfort and there's a erythema surrounding the wound bed behind her left knee. She states this discomfort often is associated with infection and in fact this wound looks to likely be infected. She has no fevers, chills, nausea, vomiting, or diarrhea. 02/12/17; the patient's wound is not as good as I thought I would find it. She was making a good improvement when I saw her a month ago. She went back to work and largely she blames difficulties at work for this. Apparently during my absence she was treated with oral antibiotics starting on 8/3. I don't believe there are any cultures 02/19/17; patient arrives in clinic today with the wound is essentially unchanged in terms of dimensions and wound appearance. We have been using silver alginate. She claims compliance with the compression pumps twice a day 02/25/17; no  major change in the condition of the wound. We used Iodo flex last week with the purpose of trying to get a better-looking surface. She could not tolerate this because of discomfort. She found this especially bad when she used her compression pumps. Before this we use silver alginate again with not a lot of success. I changed her to [employer] which will certainly not have much debridement activity however I will probably change to weekly mechanical debridements. This will help with the drainage. She is now at home/on short-term disability. 03/11/17; the patient is now on disability. She is been using PolyMem AG wound for the first time looks a little better this week and dimensions have improved. She is compliant with her external compression pumps 03/18/16; the patient is now on disability at home using her palms. We've been using PolyMen AG. Wound certainly looks smaller. Arrives today with an adherent surface requiring debridement. 04/08/17; patient has not been here in 3 weeks. Using Anasept with PolyMem AG. She arrives today with an adherent surface over the wound. She did not wish debridement, there was edema fluid dripping through the wound surface even though she states she is using her external compression pumps 04/15/17; patient is using Anasept with PolyMem and AG. She arrives today telling us that her leg is been hurting since a home care nurse staff her dressing into her on Sunday therefore she  could not use her compression pumps in spite of this her dimensions appear to be better although this is a very difficult wound to measure in the popliteal fossa of the left knee 04/22/17; arrives today in clinic using Anasept and PolyMem AG. She is using her compression pumps per her description. The wound surface however once again is not going to allow healing. She still has weeping edema fluid coming through the wound. No major change in dimensions 04/29/17; arrives today with the major  deterioration in the wound both in terms of dimensions and condition of the wound surface. She reports increasing pain since earlier in the week at our intake nurse reports purulent drainage on the ABD pads.this is not going in the right direction. We put Hydrofera Blue on this last week but I'm going to change back to [employer] because of the drainage. I reviewed previous cultures last done in June showed methicillin sensitive staph aureus. We had been making progress with this wound presenting initially with a necrotic wound on the popliteal fossa on the right. We managed to get a viable surface and some reduction in wound dimensions however in the past 4 weeks this is been getting gradually worse and today much worse in terms of both size and condition of the wound surface. The patient is having pain and will require empiric antibiotics because of the purulent drainage and deterioration in the wound 05/20/17; I haven't seen this patient in 3 weeks. Biopsy of this wound I did last time suggested extensive granulation tissue with collections of neutrophils lymphocytes plasma cells histiocytes and multinucleated giant cells. There was no evidence of malignancy. Deep tissue culture I did showed MSSA and I gave her Keflex when she was here last time and renewed this for a week however she is still taking the medication I'm not certain she actually took it properly.the pathologist suggested the possibility of pyoderma gangrenosum, a vasculopathy. PAS stains and acid-fast stains were negative She also tells me that she is having mobility problems secondary to pain in both knees. She uses a walker but can barely make it to the bathroom. It sounds as though she has a history of chronic osteoarthritis and she tells me that she had a left anterior cruciate ligament tear at some time in the past 06/10/17; this is a patient that I haven't seen in 3 weeks. She is completed her antibiotics. Biopsy suggested  the possibility of infection versus pyoderma gangrenosum. I gave her 2 weeks of Keflex directed at [facility] she is repeated this.she called last week to request more antibiotics but I wanted to see her. She arrives today with her aunt. Apparently the patient has deteriorated in terms of function. According the patient and her aunt she can no longer ambulate or easily transfer. She is incontinent because she has urge incontinence etc. They requested admission to a rehabilitation facility or the hospital and then a rehabilitation facility. I was not aware that the patient was declining in terms a shin but according to our staff that's been the case they haven't seen her walk in the him any months that she's been here. I wasn't really aware of this. In fact the patient was working up until the end of August. In any case she arrives with the wound a lot worse 07/01/17; this is a patient I haven't seen in 3 weeks. The last time she was here at the request of her family member we sent her to the ER. She apparently was  kept overnight. She was treated for UTI. Attempts were made to find her long-term care although there was insurance and cost issues and she is back home. She now has her aunt living with her. I am not clear what they are using to the wound on the posterior left knee/popliteal fossa. Last biopsy I did of this area did not document pyoderma which had been raised by another/previous biopsy. We are not clear today what they're actually putting on the wound 07/15/17; she arrives today with improvement in her wound dimensions although there is still weeping edema fluid. She claims she is changing the dressing [silver alginate] twice daily. I've suggested increasing this to 3 times a day. She is changing the silver alginate twice a day 07/29/17; patient complains of pain drainage and odor coming from the wound which was different from 2 weeks ago.. We have been using silver alginate she has been  compliant with her compression pumps 09/02/17; the patient continues to complain of pain and drainage. I gave her empiric antibiotics when she was last here 5 weeks ago but she says she only completed them recently. She seems confused about the time frame of her last visit here.she states she was concerned about an odor and stopped using the [facility] Blue on the wound in her left popliteal fossa about a week ago. I don't think she is dressing this with anything since. She is using her lymphedema pumps once a day.previous biopsy of done of this area showed inflammation associated with stasis dermatitis or trauma She is still not walking apparently because of severe osteoarthritis in both knees. She thinks she'll be going back to work in June and she needs to be ambulatory although I really can't see this happening. 09/16/17; the patient is been using a mixture of Hydrofera Blue and PolyMen that she had left over. I'm not really sure how reliable this is. She tells me she is using her external compression pumps once a day. She had a steroid shot in her left knee this morning 09/30/17; 2 week follow-up for this patient with a difficult wound behind her left popliteal fossa. She has home health. They're using Hydrofera Blue. Measurement slightly smaller. She states she is using her compression pumps once a day 10/14/17; 2 week follow-up for this woman with a difficult wound with find her left popliteal fossa. She has home health. Using Hydrofera Blue. Measurements again today slightly smaller. Most of the surface of this covered in a nonviable tightly green adherent material. Some of this may be retained Hydrofera Blue nevertheless was debrided with great difficulty. She asked me about her upcoming disability which I think happens in June. I told her that from my point of view the wound itself does not qualify her to be disabled by itself. Now I'm aware that she can no longer stand and walk.  This really deteriorated quite a bit during the course of the time she is been here. I suspect this is due to severe osteoarthritis of both knees and she is seen orthopedics for this. I think this is a source of her disability but I don't really feel comfortable with this given the fact that I don't precisely know the exact diagnosis here. 10/28/17; 2 week follow-up for this woman with a difficult wound in her left popliteal fossa. This is complicated by lymphedema. She is episodic in using her compression pumps we obtained. We've been using Hydrofera Blue. Dimensions of the wound are not any different however surface of  this looks a lot better than last time. Prior to last visit at which time I had the debridement the wound, dimensions were slightly smaller. The patient came in last time talking to me about disability. I sent her to see her primary physician who I thought would do a proper evaluation for this patient with regards to her disability thoughts. The patient is able to stand and transfer but is no longer ambulatory. She has a wheelchair at home.Marland Kitchen She works as a Freight forwarder for [employer]. She can no longer drive. 11/08/17; this is a 2 week follow-up for patient with a difficult wound in the left popliteal fossa complicated by lymphedema. I am not sure about her compliance with the compression pumps we've been using Hydrofera Blue for 2 months now and we have been making decent progress however once again today she arrives with 50% of the overall wound area covered in tightly adherent black/green necrotic tissue. 12/02/17; difficult wound in the left popliteal fossa complicated by severe lymphedema. We've been using silver alginate although the patient ran out of this and she's been using Hydrofera Blue. Last time she is here she had a considerable amount of tightly adherent green necrotic tissue. Post debridement culture showed MSSA, I gave her 7 days worth of Septra DS which seems to have  helped 12/24/17; almost palliative follow-up for this difficult area on the left popliteal fossa complicated by severe lymphedema. We've been using silver alginate although the patient tells me she's been using Hydrofera Blue. She does not separate the folds of this wound. She has been using her lymphedema pumps but not every day. 01/20/18 This is a difficult wound in the left popliteal fossa. Intake nurse noted some purulent drainage and necrotic material. She has severe lymphedema including the posterior thigh and the upper calf surrounding this wound and there is continued leaking edema fluid out of the wound surface. This is not going to heal like this. She states she is being compliant with the lymphedema pumps once sometimes twice a day. However this is not controlling the edema around the wound either in the calf or the posterior thigh 02/10/18; really not making any progress here. The wound is actually larger. There is no purulent drainage. Culture I did last time was negative/multiple organisms 03/10/18; 1 month follow-up. The wound may be measures slightly smaller. We've been using care max directly on the wound surface. The area definitely looks less moist. She tells me she is also you been using her pumps religiously twice a day. In fact the area actually looks dry today. 04/12/2018; 1 month follow-up. She states after I used silver nitrate on this wound area a month ago that the pain was intolerable and continued all month. This made it too painful for her to use the care of max reliably or to use her compression pumps. She arrives in clinic today with her wound probably twice the size. There is no purulent drainage 05/03/2018; the patient has been using her pumps reliably twice a day over the last week but she finds the combination of the pump and a dressing on the wound to be unduly painful therefore there is not been a dressing in the last week. She arrives in clinic today with the  wound continuing to enlarge. She did not tolerate silver nitrate nor Kerramax directly on the wound 05/26/2018; the patient has not been using her pumps. Her wound in the left posterior popliteal fossa continues to expand. We are using silver alginate although  the options for primary dressings are limited since she is paying out- of-pocket for this. Culture I did last time which was a PCR culture showed a scattered number of anaerobes low quantities. I did not think this was significant. 06/09/2018 the patient has not been using her pumps. She puts care of max in the area on the posterior popliteal fossa. She has no one to help her clean this area. Home health is apparently coming by once a week. For some reason she has to purchase a lot of her supplies herself online. She does not have a lot of financial resources. She states the pumps hurt her and caused the wound to bleed. She is continuing to deteriorate Biopsy I did last visit did not show malignancy/fungus or atypical Mycobacterium. This is a second time I have biopsied this 06/30/2018; the patient states she is using her pumps at least once a day. She put silver alginate and a rolled towel in the area. She occasionally uses kerramax and/or ABD pads. She is decided that a application of silver nitrate I did because of very friable mucosa on this sometime in September is the reason that she could not use the pumps. I did not actually research this. This is clearly not the case 1/30; the patient states she is using her pumps once a day but sometimes for as long as 2 hours. I told her that she still needs to do this and additional time a day. She is using silver alginate. She states the [facility] hurts when she uses her pumps therefore she is not using this. She has ABD pads that she is getting from home health. Dimensions perhaps slightly improved 3/5; the patient uses her pumps once a day but she states for is sometimes as long as 2 hours. She  has not managed to do this twice a day. She is using silver alginate. Separating these areas with ABDs and rolled towels. There is no change in dimensions of this wound if anything slightly larger although the surfaces look better 4/2; I do not see too much difference 1 way or the other in the area on the left popliteal fossa. She is not using her pumps. She did describe an episode of pain and odor a week or 2 ago however she said it is mostly subsided. Still using silver alginate. 5/8; monthly follow-up for this large area in the left popliteal fossa. She is not using her compression pumps there is surrounding lymphedema from tissue in the posterior thigh in the posterior calf. Her home health nurse had called 2 weeks ago to report black drainage on the surface, she used silver alginate after that and it seems to have cleared up. 6/4; Patient is not using pumps. Finds silver alginate painful. I think she is just using ABD's in the area. She has a new wound laterally to the original wound. Leaking copious lymphedema 7/9; monthly follow-up. Patient is using her pumps about once a week finds it painful. She uses some combination of silver calcium alginate. She has to buy her own [place] and she uses an ABD. She has home health but her co-pay is such that it is cheaper for her to buy her supplies online 8/6-Patient is here for her monthly follow-up, she has started using her pumps again, apparently a couple of weeks ago she had a lot of pain and could not use the pumps, she is now using silver alginate, she states her left posterior calf wound had  some drainage that was greenish now converted to serous 10/13; 61-month follow-up. The patient's wound is on the left popliteal fossa. Fairly substantial area in the middle of 2 pannus folds the lower part of her thigh and the upper part of her calf. She has been using silver alginate. As usual she presents with greenish drainage but the wound  does not look infected Objective Constitutional Patient is hypertensive.. Pulse regular and within target range for patient.Marland Kitchen Respirations regular, non-labored and within target range.. Temperature is normal and within the target range for the patient.Marland Kitchen Appears in no distress. Vitals Time Taken: 3:09 PM, Height: 70 in, Weight: 484 lbs, BMI: 69.4, Temperature: 98.4 F, Pulse: 110 bpm, Respiratory Rate: 22 breaths/min, Blood Pressure: 148/57 mmHg. Eyes Conjunctivae clear. No discharge.no icterus. Respiratory work of breathing is normal. Cardiovascular It will pulses are palpable. Musculoskeletal Osteoarthritis of both knees. Psychiatric appears at normal baseline. General Notes: Wound exam; substantial area of the left popliteal fossa. There is no evidence of infection here. The wound is certainly not smaller. No gross infection no tenderness. She has poor control of lymphedema around this. There is no evidence of infection Integumentary (Hair, Skin) Severe bilateral lymphedema. Changes of chronic hemosiderin deposition. Wound #1 status is Open. Original cause of wound was Gradually Appeared. The wound is located on the Left,Posterior Knee. The wound measures 1cm length x 2.2cm width x 0.1cm depth; 1.728cm^2 area and 0.173cm^3 volume. There is Fat Layer (Subcutaneous Tissue) Exposed exposed. There is no tunneling or undermining noted. There is a large amount of purulent drainage noted. The wound margin is flat and intact. There is large (67-100%) **red granulation within the wound bed. There is no necrotic tissue within the wound bed. Wound #3 status is Open. Original cause of wound was Gradually Appeared. The wound is located on the Left,Medial Lower Leg. The wound measures 5.8cm length x 7cm width x 0.1cm depth; 31.887cm^2 area and 3.189cm^3 volume. There is Fat Layer (Subcutaneous Tissue) Exposed exposed. There is no tunneling or undermining noted. There is a medium amount of purulent  drainage noted. The wound margin is thickened. There is large (67-100%) pink granulation within the wound bed. There is no necrotic tissue within the wound bed. Assessment Active Problems ICD-10 Chronic venous hypertension (idiopathic) with inflammation of left lower extremity Lymphedema, not elsewhere classified Non-pressure chronic ulcer of left calf limited to breakdown of skin Type 2 diabetes mellitus with other skin ulcer Other obesity due to excess calories Plan Follow-up Appointments: Other: - 2 months Dressing Change Frequency: Wound #1 Left,Posterior Knee: Change dressing every day. Wound Cleansing: May shower and wash wound with soap and water. - also cleanse with Vashe solution Primary Wound Dressing: Wound #1 Left,Posterior Knee: Calcium Alginate with Silver Wound #3 Left,Medial Lower Leg: Calcium Alginate with Silver Secondary Dressing: Wound #1 Left,Posterior Knee: Kerlix/Rolled Gauze ABD pad - or incontinence pads secure with transpore tape Edema Control: Avoid standing for long periods of time Elevate legs to the level of the heart or above for 30 minutes daily and/or when sitting, a frequency of: - throughout the day Exercise regularly Segmental Compressive Device. - lymphedema pumps 60 minutes 2 times per day! Off-Loading: Other: - place rolled towel or washcloth in skin fold behind left knee to separate skin in fold Additional Orders / Instructions: Follow Nutritious Diet - increase protein and vegetables to aid in wound healing. Home Health: [facility] for [facility] care. - [place]. 2 times per week; patient is  homebound. 1. I have no reason to change the dressing here which is silver alginate. 2. She has very poor lymphedema control. She will not use her pumps. She is not putting separation between the 2 pannus folds in the area. 3. I follow her on a palliative basis. I have no reason to believe that these areas are going  to heal. She is simply not compliant enough to do this. 4. There is no evidence of infection in spite of concerning color changes in the draining. The only thing I see is weeping clear lymphedema fluid Electronic Signature(s) Signed: 04/04/2019 5:42:33 PM By: Linton Ham MD Entered By: Linton Ham on 04/04/2019 17:07:23 -------------------------------------------------------------------------------- SuperBill Details Patient Name: Date of Service: [name] 04/04/2019 Medical Record [id number] Patient Account Number: [id number] Date of Birth/Sex: Treating RN: [birth year] (53 y.o. [name] Primary Care Provider: Fanny Bien Other Clinician: Referring Provider: Treating Provider/Extender:Salisha Bardsley, Gennette Pac, Rojelio Brenner in Treatment: 130 Diagnosis Coding ICD-10 Codes Code Description I87.322 Chronic venous hypertension (idiopathic) with inflammation of left lower extremity I89.0 Lymphedema, not elsewhere classified L97.221 Non-pressure chronic ulcer of left calf limited to breakdown of skin E11.622 Type 2 diabetes mellitus with other skin ulcer E66.09 Other obesity due to excess calories Facility Procedures CPT4 Code: [id number] Description: 99213 - WOUND CARE VISIT-LEV 3 EST PT Modifier: Quantity: 1 Physician Procedures CPT4 Code Description: [id number] - WC PHYS LEVEL 3 - EST PT ICD-10 Diagnosis Description I87.322 Chronic venous hypertension (idiopathic) with inflammation extremity I89.0 Lymphedema, not elsewhere classified L97.221 Non-pressure chronic ulcer of  left calf limited to breakdo Modifier: of left lower wn of skin Quantity: 1 Electronic Signature(s) Signed: 04/04/2019 5:42:33 PM By: Linton Ham MD Entered By: Linton Ham on 04/04/2019 17:07:40

## 2019-05-30 NOTE — Progress Notes (Signed)
[name], [name] ([id number]) Visit Report for 04/04/2019 Arrival Information Details Patient Name: Date of Service: [name], [name] 04/04/2019 2:15 PM Medical Record [id number] Patient Account Number: [id number] Date of Birth/Sex: Treating RN: 08-31-1965 (53 y.o. [name] Primary Care [name]: [name] Other Clinician: Referring [name]: Treating [name]/Extender:[name]: 68 Visit Information History Since Last Visit Walker Added or deleted any medications: No Patient Arrived: Any new allergies or adverse reactions: No Arrival Time: 15:06 Had a fall or experienced change in No Accompanied By: self activities of daily living that may affect Transfer Assistance: None risk of falls: Patient Identification Verified: Yes Signs or symptoms of abuse/neglect since last visito No Secondary Verification Process Completed: Yes Hospitalized since last visit: No Patient Requires Transmission-Based No Implantable device outside of the clinic excluding No Precautions: cellular tissue based products placed in the center Patient Has Alerts: No since last visit: Pain Present Now: No Electronic Signature(s) Signed: 04/05/2019 5:37:20 PM By: [name] on 04/04/2019 Ware -------------------------------------------------------------------------------- Clinic Level of Care Assessment Details Patient Name: Date of Service: [name] 04/04/2019 2:15 PM Medical Record [id number] Patient Account Number: [id number] Date of Birth/Sex: Treating RN: 1965-12-30 (53 y.o. [name] Primary Care [name]: [name] Other Clinician: Referring [name]: Treating [name]/Extender:[name]: 130 Clinic Level of Care Assessment Items TOOL 4 Quantity Score X - Use when only an EandM is performed on  FOLLOW-UP visit 1 0 ASSESSMENTS - Nursing Assessment / Reassessment X - Reassessment of Co-morbidities (includes updates in patient status) 1 10 X - Reassessment of Adherence to Ware Plan 1 5 ASSESSMENTS - Wound and Skin Assessment / Reassessment []  - Simple Wound Assessment / Reassessment - one wound 0 X - Complex Wound Assessment / Reassessment - multiple wounds 2 5 []  - Dermatologic / Skin Assessment (not related to wound area) 0 ASSESSMENTS - Focused Assessment []  - Circumferential Edema Measurements - multi extremities 0 []  - Nutritional Assessment / Counseling / Intervention 0 []  - Lower Extremity Assessment (monofilament, tuning fork, pulses) 0 []  - Peripheral Arterial Disease Assessment (using hand held doppler) 0 ASSESSMENTS - Ostomy and/or Continence Assessment and Care []  - Incontinence Assessment and Management 0 []  - Ostomy Care Assessment and Management (repouching, etc.) 0 PROCESS - Coordination of Care X - Simple Patient / Family Education for ongoing care 1 15 []  - Complex (extensive) Patient / Family Education for ongoing care 0 X - Staff obtains Programmer, systems, Records, Test Results / Process Orders 1 10 []  - Staff telephones HHA, Nursing Homes / Clarify orders / etc 0 []  - Routine Transfer to another Facility (non-emergent condition) 0 []  - Routine Hospital Admission (non-emergent condition) 0 []  - New Admissions / Biomedical engineer / Ordering NPWT, Apligraf, etc. 0 []  - Emergency Hospital Admission (emergent condition) 0 X - Simple Discharge Coordination 1 10 []  - Complex (extensive) Discharge Coordination 0 PROCESS - Special Needs []  - Pediatric / Minor Patient Management 0 []  - Isolation Patient Management 0 []  - Hearing / Language / Visual special needs 0 []  - Assessment of Community assistance (transportation, D/C planning, etc.) 0 []  - Additional assistance / Altered mentation 0 []  - Support Surface(s) Assessment (bed, cushion, seat, etc.)  0 INTERVENTIONS - Wound Cleansing / Measurement []  - Simple Wound Cleansing - one wound 0 X - Complex Wound Cleansing - multiple wounds 2 5 X - Wound Imaging (photographs - any number of  wounds) 1 5 []  - Wound Tracing (instead of photographs) 0 []  - Simple Wound Measurement - one wound 0 X - Complex Wound Measurement - multiple wounds 2 5 INTERVENTIONS - Wound Dressings []  - Small Wound Dressing one or multiple wounds 0 []  - Medium Wound Dressing one or multiple wounds 0 X - Large Wound Dressing one or multiple wounds 1 20 X - Application of Medications - topical 1 5 []  - Application of Medications - injection 0 INTERVENTIONS - Miscellaneous []  - External ear exam 0 []  - Specimen Collection (cultures, biopsies, blood, body fluids, etc.) 0 []  - Specimen(s) / Culture(s) sent or taken to Lab for analysis 0 []  - Patient Transfer (multiple staff / [name] Lift / Similar devices) 0 []  - Simple Staple / Suture removal (25 or less) 0 []  - Complex Staple / Suture removal (26 or more) 0 []  - Hypo / Hyperglycemic Management (close monitor of Blood Glucose) 0 []  - Ankle / Brachial Index (ABI) - do not check if billed separately 0 X - Vital Signs 1 5 Has the patient been seen at the hospital within the last three years: Yes Total Score: 115 Level Of Care: New/Established - Level 3 Electronic Signature(s) Signed: 05/30/2019 3:01:15 PM By: Sierra Coria RN Entered By: [name] on 04/04/2019 16:19:25 -------------------------------------------------------------------------------- Encounter Discharge Information Details Patient Name: Date of Service: [name] 04/04/2019 2:15 PM Medical Record [id number] Patient Account Number: [id number] Date of Birth/Sex: Treating RN: 1965/07/02 (53 y.o. [name] Primary Care Rowin Bayron: [name] Other Clinician: Referring Jnai Snellgrove: Treating Mahoganie Basher/Extender:[name]:  130 Encounter Discharge Information Items Discharge Condition: Stable Ambulatory Status: Walker Discharge Destination: Home Transportation: Private Auto Accompanied By: alone Schedule Follow-up Appointment: Yes Clinical Summary of Care: Patient Declined Electronic Signature(s) Signed: 04/04/2019 5:52:09 PM By: Levan Hurst RN, BSN Entered By: Levan Hurst on 04/04/2019 17:47:37 -------------------------------------------------------------------------------- Multi Wound Chart Details Patient Name: Date of Service: [name] 04/04/2019 2:15 PM Medical Record [id number] Patient Account Number: [id number] Date of Birth/Sex: Treating RN: 1966/03/03 (53 y.o. [name] Primary Care Leevon Upperman: [name] Other Clinician: Referring Marri Mcneff: Treating Shaketa Serafin/Extender:[name]: 130 Vital Signs Height(in): 70 Pulse(bpm): 110 Weight(lbs): 484 Blood Pressure(mmHg): 148/57 Body Mass Index(BMI): 69 Temperature(F): 98.4 Respiratory 22 Rate(breaths/min): Photos: [1:No Photos] [3:No Photos] [N/A:N/A] Wound Location: [1:Left Knee - Posterior] [3:Left Lower Leg - Medial] [N/A:N/A] Wounding Event: [1:Gradually Appeared] [3:Gradually Appeared] [N/A:N/A] Primary Etiology: [1:Diabetic Wound/Ulcer of the Lymphedema Lower Extremity] [N/A:N/A] Comorbid History: [1:Type II Diabetes, Osteoarthritis] [3:Type II Diabetes, Osteoarthritis] [N/A:N/A] Date Acquired: [1:03/23/2016] [3:01/09/2019] [N/A:N/A] Weeks of Ware: [1:130] [3:9] [N/A:N/A] Wound Status: [1:Open] [3:Open] [N/A:N/A] Measurements L x W x D 1x2.2x0.1 [3:5.8x7x0.1] [N/A:N/A] (cm) Area (cm) : [1:1.728] [3:31.887] [N/A:N/A] Volume (cm) : [1:0.173] [3:3.189] [N/A:N/A] % Reduction in Area: [1:93.70%] [3:-8182.30%] [N/A:N/A] % Reduction in Volume: 99.40% [3:-8292.10%] [N/A:N/A] Classification: [1:Grade 2] [3:Full Thickness Without Exposed Support  Structures] [N/A:N/A] Exudate Amount: [1:Large] [3:Medium] [N/A:N/A] Exudate Type: [1:Purulent] [3:Purulent] [N/A:N/A] Exudate Color: [1:yellow, brown, green] [3:yellow, brown, green] [N/A:N/A] Wound Margin: [1:Flat and Intact] [3:Thickened] [N/A:N/A] Granulation Amount: [1:Large (67-100%)] [3:Large (67-100%)] [N/A:N/A] Granulation Quality: [1:Red] [3:Pink] [N/A:N/A] Necrotic Amount: [1:None Present (0%)] [3:None Present (0%)] [N/A:N/A] Exposed Structures: [1:Fat Layer (Subcutaneous Tissue) Exposed: Yes Fascia: No Tendon: No Muscle: No Joint: No Bone: No None] [3:Fat Layer (Subcutaneous N/A Tissue) Exposed: Yes Fascia: No Tendon: No Muscle: No Joint: No Bone: No None] [N/A:N/A] Ware Notes Electronic Signature(s) Signed: 04/04/2019  5:42:33 PM By: Linton Ham MD Signed: 05/30/2019 3:01:15 PM By: Sierra Coria RN Entered By: Linton Ham on 04/04/2019 17:01:46 -------------------------------------------------------------------------------- Multi-Disciplinary Care Plan Details Patient Name: Date of Service: Sierra Ao T. 04/04/2019 2:15 PM Medical Record [id number] Patient Account Number: [id number] Date of Birth/Sex: Treating RN: 1966/04/11 (53 y.o. [name] Primary Care Qamar Aughenbaugh: [name] Other Clinician: Referring Jennefer Kopp: Treating Ayman Brull/Extender:[name]: 130 Active Inactive Necrotic Tissue Nursing Diagnoses: Impaired tissue integrity related to necrotic/devitalized tissue Goals: Necrotic/devitalized tissue will be minimized in the wound bed Date Initiated: 03/02/2019 Target Resolution Date: 04/28/2019 Goal Status: Active Interventions: Assess patient pain level pre-, during and post procedure and prior to discharge Provide education on necrotic tissue and debridement process Ware Activities: Apply topical anesthetic as ordered : 03/02/2019 Notes: Wound/Skin Impairment Nursing  Diagnoses: Impaired tissue integrity Goals: Patient/caregiver will verbalize understanding of skin care regimen Date Initiated: 09/16/2017 Target Resolution Date: 04/14/2019 Goal Status: Active Ulcer/skin breakdown will heal within 14 weeks Date Initiated: 04/12/2018 Date Inactivated: 05/03/2018 Target Resolution Date: 07/13/2018 Unmet Reason: not healed, Goal Status: Unmet comorbid morbid obesity Interventions: Assess patient/caregiver ability to perform ulcer/skin care regimen upon admission and as needed Notes: Electronic Signature(s) Signed: 05/30/2019 3:01:15 PM By: Sierra Coria RN Entered By: [name] on 04/04/2019 14:59:22 -------------------------------------------------------------------------------- Pain Assessment Details Patient Name: Date of Service: [name], [name] 04/04/2019 2:15 PM Medical Record [id number] Patient Account Number: [id number] Date of Birth/Sex: Treating RN: 07-28-1965 (52 y.o. [name] Primary Care Porcia Morganti: [name] Other Clinician: Referring Acie Custis: Treating Pietrina Jagodzinski/Extender:[name]: 130 Active Problems Location of Pain Severity and Description of Pain Patient Has Paino No Site Locations Pain Management and Medication Current Pain Management: Electronic Signature(s) Signed: 04/05/2019 5:37:20 PM By: [name] on 04/04/2019 15:09:40 -------------------------------------------------------------------------------- Patient/Caregiver Education Details Patient Name: Date of Service: [name] 10/13/2020andnbsp2:15 PM Medical Record Patient Account Number: [id number] Number: Treating RN: [name] Date of Birth/Gender: Jun 14, 1966 (53 y.o. F) Other Clinician: Primary Care Physician: [name] Treating Linton Ham Referring Physician: Physician/Extender: Carolanne Grumbling in  Ware: [phone] Education Assessment Education Provided To: Patient Education Topics Provided Wound Debridement: Methods: Explain/Verbal Responses: State content correctly Electronic Signature(s) Signed: 05/30/2019 3:01:15 PM By: Sierra Coria RN Entered By: [name] on 04/04/2019 14:59:44 -------------------------------------------------------------------------------- Wound Assessment Details Patient Name: Date of Service: [name], [name] 04/04/2019 2:15 PM Medical Record [id number] Patient Account Number: [id number] Date of Birth/Sex: Treating RN: 12/11/1965 (53 y.o. [name] Primary Care Marri Mcneff: [name] Other Clinician: Referring Ashanty Coltrane: Treating Gabryel Talamo/Extender:[name]: 130 Wound Status Wound Number: 1 Primary Diabetic Wound/Ulcer of the Lower Etiology: Extremity Wound Location: Left Knee - Posterior Wound Status: Open Wounding Event: Gradually Appeared Comorbid Type II Diabetes, Osteoarthritis Date Acquired: 03/23/2016 History: Weeks Of Ware: 130 Clustered Wound: No Photos Wound Measurements Length: (cm) 1 Width: (cm) 2.2 Depth: (cm) 0.1 Area: (cm) 1.728 Volume: (cm) 0.173 Wound Description Classification: Grade 2 Wound Margin: Flat and Intact Exudate Amount: Large Exudate Type: Purulent Exudate Color: yellow, brown, green Wound Bed Granulation Amount: Large (67-100%) Granulation Quality: Red Necrotic Amount: None Present (0%) ter Cleansing: No no Yes Exposed Structure ed: No ubcutaneous Tissue) Exposed: Yes ed: No ed: No d: No : No % Reduction in Area: 93.7% % Reduction in Volume: 99.4% Epithelialization: None Tunneling: No Undermining: No Foul Odor Af Slough/Fibri Fascia Expos Fat Layer (  S Tendon Expos Muscle Expos Joint Expose Bone Exposed Electronic Signature(s) Signed: 04/05/2019 5:37:20 PM By: [name] Signed: 04/26/2019 4:01:42 PM  By: Mikeal Hawthorne EMT/HBOT Entered By: Mikeal Hawthorne on 04/05/2019 08:29:08 -------------------------------------------------------------------------------- Wound Assessment Details Patient Name: Date of Service: [name] 04/04/2019 2:15 PM Medical Record [id number] Patient Account Number: [id number] Date of Birth/Sex: Treating RN: Sep 07, 1965 (53 y.o. [name] Primary Care Shiloh Southern: [name] Other Clinician: Referring Lurlean Kernen: Treating Elexia Friedt/Extender:[name]: 130 Wound Status Wound Number: 3 Primary Etiology: Lymphedema Wound Location: Left Lower Leg - Medial Wound Status: Open Wounding Event: Gradually Appeared Comorbid History: Type II Diabetes, Osteoarthritis Date Acquired: 01/09/2019 Weeks Of Ware: 9 Clustered Wound: No Photos Wound Measurements Length: (cm) 5.8 % Reduct Width: (cm) 7 % Reduct Depth: (cm) 0.1 Epitheli Area: (cm) 31.887 Tunneli Volume: (cm) 3.189 Undermi Wound Description Classification: Full Thickness Without Exposed Support Foul Odo Structures Slough/F Wound Thickened Margin: Exudate Medium Amount: Exudate Purulent Type: Exudate yellow, brown, green Color: Wound Bed Granulation Amount: Large (67-100%) Granulation Quality: Pink Fascia E Necrotic Amount: None Present (0%) Fat Layer ( Tendon Expo Muscle Expo Joint Expos Bone Expose r After Cleansing: No ibrino No Exposed Structure xposed: No Subcutaneous Tissue) Exposed: Yes sed: No sed: No ed: No d: No ion in Area: -8182.3% ion in Volume: -8292.1% alization: None ng: No ning: No Electronic Signature(s) Signed: 04/05/2019 5:37:20 PM By: [name] Signed: 04/26/2019 4:01:42 PM By: Mikeal Hawthorne EMT/HBOT Entered By: Mikeal Hawthorne on 04/05/2019 08:28:43 -------------------------------------------------------------------------------- Vitals Details Patient Name: Date of  Service: [name] 04/04/2019 2:15 PM Medical Record [id number] Patient Account Number: [id number] Date of Birth/Sex: Treating RN: 1966/04/21 (53 y.o. [name] Primary Care Kaleen Rochette: [name] Other Clinician: Referring Mikhi Athey: Treating Franco Duley/Extender:[name]: 130 Vital Signs Time Taken: 15:09 Temperature (F): 98.4 Height (in): 70 Pulse (bpm): 110 Weight (lbs): 484 Respiratory Rate (breaths/min): 22 Body Mass Index (BMI): 69.4 Blood Pressure (mmHg): 148/57 Reference Range: 80 - 120 mg / dl Electronic Signature(s) Signed: 04/05/2019 5:37:20 PM By: [name] on 04/04/2019 15:09:34

## 2019-05-31 NOTE — Progress Notes (Signed)
[name], [name] ([id number]) Visit Report for 05/30/2019 Arrival Information Details Patient Name: Date of Service: SOLASH, GUIST 05/30/2019 2:15 PM Medical Record [id number] Patient Account Number: [id number] Date of Birth/Sex: Treating RN: 04-12-1966 (53 y.o. Debby Bud Primary Care Deaglan Lile: Fanny Bien Other Clinician: Referring Myalynn Lingle: Treating Cyler Kappes/Extender:Robson, Gennette Pac, Rojelio Brenner in Treatment: 19 Visit Information History Since Last Visit Walker Added or deleted any medications: No Patient Arrived: Any new allergies or adverse reactions: No Arrival Time: 15:10 Had a fall or experienced change in No Accompanied By: self activities of daily living that may affect Transfer Assistance: None risk of falls: Patient Identification Verified: Yes Signs or symptoms of abuse/neglect since last No Secondary Verification Process Completed: Yes visito Patient Requires Transmission-Based No Hospitalized since last visit: No Precautions: Implantable device outside of the clinic excluding No Patient Has Alerts: No cellular tissue based products placed in the center since last visit: Has Dressing in Place as Prescribed: Yes Pain Present Now: Yes Electronic Signature(s) Signed: 05/30/2019 5:32:49 PM By: Deon Pilling Entered By: Deon Pilling on 05/30/2019 15:10:28 -------------------------------------------------------------------------------- Clinic Level of Care Assessment Details Patient Name: Date of Service: Sierra, Ware 05/30/2019 2:15 PM Medical Record [id number] Patient Account Number: [id number] Date of Birth/Sex: Treating RN: 10/03/1965 (53 y.o. [name] Primary Care Cas Tracz: Fanny Bien Other Clinician: Referring Huong Luthi: Treating Annasophia Crocker/Extender:Robson, Gennette Pac, Rojelio Brenner in Treatment: [name] TOOL 4 Quantity Score X - Use when only  an EandM is performed on FOLLOW-UP visit 1 0 ASSESSMENTS - Nursing Assessment / Reassessment X - Reassessment of Co-morbidities (includes updates in patient status) 1 10 X - Reassessment of Adherence to Treatment Plan 1 5 ASSESSMENTS - Wound and Skin Assessment / Reassessment X - Simple Wound Assessment / Reassessment - one wound 1 5 []  - Complex Wound Assessment / Reassessment - multiple wounds 0 []  - Dermatologic / Skin Assessment (not related to wound area) 0 ASSESSMENTS - Focused Assessment []  - Circumferential Edema Measurements - multi extremities 0 []  - Nutritional Assessment / Counseling / Intervention 0 []  - Lower Extremity Assessment (monofilament, tuning fork, pulses) 0 []  - Peripheral Arterial Disease Assessment (using hand held doppler) 0 ASSESSMENTS - Ostomy and/or Continence Assessment and Care []  - Incontinence Assessment and Management 0 []  - Ostomy Care Assessment and Management (repouching, etc.) 0 PROCESS - Coordination of Care X - Simple Patient / Family Education for ongoing care 1 15 []  - Complex (extensive) Patient / Family Education for ongoing care 0 X - Staff obtains Programmer, systems, Records, Test Results / Process Orders 1 10 []  - Staff telephones HHA, Nursing Homes / Clarify orders / etc 0 []  - Routine Transfer to another Facility (non-emergent condition) 0 []  - Routine Hospital Admission (non-emergent condition) 0 []  - New Admissions / Biomedical engineer / Ordering NPWT, Apligraf, etc. 0 []  - Emergency Hospital Admission (emergent condition) 0 X - Simple Discharge Coordination 1 10 []  - Complex (extensive) Discharge Coordination 0 PROCESS - Special Needs []  - Pediatric / Minor Patient Management 0 []  - Isolation Patient Management 0 []  - Hearing / Language / Visual special needs 0 []  - Assessment of Community assistance (transportation, D/C planning, etc.) 0 []  - Additional assistance / Altered mentation 0 []  - Support Surface(s) Assessment (bed, cushion,  seat, etc.) 0 INTERVENTIONS - Wound Cleansing / Measurement []  - Simple Wound Cleansing - one wound 0 X - Complex Wound Cleansing - multiple wounds 2 5 X -  Wound Imaging (photographs - any number of wounds) 1 5 []  - Wound Tracing (instead of photographs) 0 []  - Simple Wound Measurement - one wound 0 X - Complex Wound Measurement - multiple wounds 2 5 INTERVENTIONS - Wound Dressings []  - Small Wound Dressing one or multiple wounds 0 X - Medium Wound Dressing one or multiple wounds 1 15 []  - Large Wound Dressing one or multiple wounds 0 []  - Application of Medications - topical 0 []  - Application of Medications - injection 0 INTERVENTIONS - Miscellaneous []  - External ear exam 0 []  - Specimen Collection (cultures, biopsies, blood, body fluids, etc.) 0 []  - Specimen(s) / Culture(s) sent or taken to Lab for analysis 0 []  - Patient Transfer (multiple staff / Civil Service fast streamer / Similar devices) 0 []  - Simple Staple / Suture removal (25 or less) 0 []  - Complex Staple / Suture removal (26 or more) 0 []  - Hypo / Hyperglycemic Management (close monitor of Blood Glucose) 0 []  - Ankle / Brachial Index (ABI) - do not check if billed separately 0 X - Vital Signs 1 5 Has the patient been seen at the hospital within the last three years: Yes Total Score: 100 Level Of Care: New/Established - Level 3 Electronic Signature(s) Signed: 05/31/2019 12:07:41 PM By: Carlene Coria RN Entered By: Carlene Coria on 05/30/2019 15:49:49 -------------------------------------------------------------------------------- Encounter Discharge Information Details Patient Name: Date of Service: Sierra Ao T. 05/30/2019 2:15 PM Medical Record [id number] Patient Account Number: [id number] Date of Birth/Sex: Treating RN: 22-Jul-1965 (53 y.o. [name] Primary Care Layken Beg: Fanny Bien Other Clinician: Referring Kenneshia Rehm: Treating Norvil Martensen/Extender:Robson, Gennette Pac, Rojelio Brenner in  Treatment: [phone] Encounter Discharge Information Ware Discharge Condition: Stable Ambulatory Status: Walker Discharge Destination: Home Transportation: Other Accompanied By: self Schedule Follow-up Appointment: Yes Clinical Summary of Care: Patient Declined Electronic Signature(s) Signed: 05/31/2019 12:04:56 PM By: Kela Millin Entered By: Kela Millin on 05/30/2019 16:07:42 -------------------------------------------------------------------------------- Lower Extremity Assessment Details Patient Name: Date of Service: Danna Hefty 05/30/2019 2:15 PM Medical Record [id number] Patient Account Number: [id number] Date of Birth/Sex: Treating RN: 1965/09/05 (53 y.o. Debby Bud Primary Care Eddrick Dilone: Fanny Bien Other Clinician: Referring Amirr Achord: Treating Mattheu Brodersen/Extender:Robson, Gennette Pac, Rojelio Brenner in Treatment: 138 Electronic Signature(s) Signed: 05/30/2019 5:32:49 PM By: Deon Pilling Entered By: Deon Pilling on 05/30/2019 15:20:23 -------------------------------------------------------------------------------- Multi Wound Chart Details Patient Name: Date of Service: Danna Hefty 05/30/2019 2:15 PM Medical Record [id number] Patient Account Number: [id number] Date of Birth/Sex: Treating RN: [birth year] (53 y.o. 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] [N/A:N/A] Comorbid History: [1:Type II Diabetes, Osteoarthritis] [3:Type II Diabetes, Osteoarthritis] [N/A:N/A] Date Acquired: [1:03/23/2016] [3:01/09/2019] [N/A:N/A] Weeks of Treatment: [1:138] [3:17] [N/A:N/A] Wound Status: [1:Open] [3:Open] [N/A:N/A] Measurements L x W x D 1x1.3x0.1 [3:5.2x9x0.1] [N/A:N/A] (cm) Area (cm) : [1:1.021] [3:36.757] [N/A:N/A] Volume (cm) : [1:0.102] [3:3.676] [N/A:N/A] % Reduction in Area: [1:96.30%] [3:-9447.30%] [N/A:N/A] % Reduction in Volume: 99.60% [3:-9573.70%] [N/A:N/A] Classification: [1:Grade 2] [3:Full Thickness Without Exposed Support Structures] [N/A:N/A] Exudate Amount: [1:Medium] [3:Large] [N/A:N/A] Exudate Type: [1:Serosanguineous] [3:Purulent] [N/A:N/A] Exudate Color: [1:red, brown] [3:yellow, brown, green] [N/A:N/A]  Wound Margin: [1:Flat and Intact] [3:Thickened] [N/A:N/A] Granulation Amount: [1:Large (67-100%)] [3:Large (67-100%)] [N/A:N/A] Granulation Quality: [1:Red] [3:Pink] [N/A:N/A] Necrotic Amount: [1:Small (1-33%)] [3:None Present (0%)] [N/A:N/A] Exposed Structures: [1:Fat Layer (Subcutaneous Fat Layer (Subcutaneous N/A Tissue) Exposed: Yes Fascia: No Tendon: No Muscle: No Joint: No Bone: No Small (1-33%)] [3:Tissue) Exposed: Yes Fascia: No Tendon: No Muscle: No Joint: No Bone: No None] [N/A:N/A] Treatment Notes Wound #1 (Left, Posterior Knee) 1. Cleanse With Wound Cleanser 3. Primary Dressing Applied Calcium Alginate Ag 4. Secondary Dressing ABD Pad 5. Secured With Tape Wound #3 (Left, Medial Lower Leg) 1. Cleanse With Wound Cleanser 3. Primary Dressing Applied Calcium Alginate Ag 4. Secondary Dressing ABD Pad 5. Secured With Recruitment consultant) Signed: 05/30/2019 5:53:37 PM By: Linton Ham MD Signed: 05/31/2019 12:07:41 PM By: Carlene Coria RN Entered By: Linton Ham on 05/30/2019  17:33:35 -------------------------------------------------------------------------------- [place] Details Patient Name: Date of Service: Sierra Ao T. 05/30/2019 2:15 PM Medical Record [id number] Patient Account Number: [id number] Date of Birth/Sex: Treating RN: [birth year] (53 y.o. [name] Primary Care Azul Coffie: Fanny Bien Other Clinician: Referring Najee Manninen: Treating Lemar Bakos/Extender:Robson, Gennette Pac, Rojelio Brenner in Treatment: 138 Active Inactive Necrotic Tissue Nursing Diagnoses: Impaired tissue integrity related to necrotic/devitalized tissue Goals: Necrotic/devitalized tissue will be minimized in the wound bed Date Initiated: 03/02/2019 Target Resolution Date: 06/02/2019 Goal Status: Active Interventions: Assess patient pain level pre-, during and post procedure and prior to discharge Provide education on necrotic tissue and debridement process Treatment Activities: Apply topical anesthetic as ordered : 03/02/2019 Notes: Wound/Skin Impairment Nursing Diagnoses: Impaired tissue integrity Goals: Patient/caregiver will verbalize understanding of skin care regimen Date Initiated: 09/16/2017 Target Resolution Date: 04/14/2019 Goal Status: Active Ulcer/skin breakdown will heal within 14 weeks Date Initiated: 04/12/2018 Date Inactivated: 05/03/2018 Target Resolution Date: 07/13/2018 Unmet Reason: not healed, Goal Status: Unmet comorbid morbid obesity Interventions: Assess patient/caregiver ability to perform ulcer/skin care regimen upon admission and as needed Notes: Electronic Signature(s) Signed: 05/31/2019 12:07:41 PM By: Carlene Coria RN Entered By: Carlene Coria on 05/30/2019 15:36:12 -------------------------------------------------------------------------------- Pain Assessment Details Patient Name: Date of Service: HEER, MATHUS 05/30/2019 2:15 PM Medical Record [id number] Patient Account  Number: [id number] Date of Birth/Sex: Treating RN: [birth year] (53 y.o. Debby Bud Primary Care Shelvie Salsberry: Fanny Bien Other Clinician: Referring Selenia Mihok: Treating Tyyne Cliett/Extender:Robson, Gennette Pac, Rojelio Brenner in Treatment: 138 Active Problems Location of Pain Severity and Description of Pain Patient Has Paino Yes Site Locations Pain Location: Generalized Pain Rate the pain. Current Pain Level: 9 Worst Pain Level: 10 Least Pain Level: 0 Tolerable Pain Level: 8 Pain Management and Medication Current Pain Management: Medication: Yes Cold Application: No Rest: Yes Massage: No Activity: No WareE.N.S.: No Heat Application: No Leg drop or elevation: No Is the Current Pain Management Adequate: Adequate How does your wound impact your activities of daily livingo Sleep: No Bathing: No Appetite: No Relationship With Others: No Bladder Continence: No Emotions: No Bowel Continence: No Work: No Toileting: No Drive: No Dressing: No Hobbies: No Electronic Signature(s) Signed: 05/30/2019 5:32:49 PM By: Deon Pilling Entered By: Deon Pilling on 05/30/2019 15:11:45 -------------------------------------------------------------------------------- Patient/Caregiver Education Details Patient Name: Date of Service: Danna Hefty 12/8/2020andnbsp2:15 PM Medical Record [id number] Patient Account Number: [id number] Date of Birth/Gender: Treating RN: 17-Aug-1965 (53 y.o. [name] Primary Care Physician: Fanny Bien Other Clinician: Referring Physician: Treating Physician/Extender:Robson, Gennette Pac, Rojelio Brenner in Treatment: 138 Education Assessment Education Provided To: Patient Education Topics Provided Wound Debridement: Methods: Explain/Verbal Responses: State  content correctly Electronic Signature(s) Signed: 05/31/2019 12:07:41 PM By: Carlene Coria RN Entered By: Carlene Coria on 05/30/2019  15:37:25 -------------------------------------------------------------------------------- Wound Assessment Details Patient Name: Date of Service: GABRELLA, FREKING 05/30/2019 2:15 PM Medical Record [id number] Patient Account Number: [id number] Date of Birth/Sex: Treating RN: [birth year] (53 y.o. Helene Shoe, Meta.Reding Primary Care Sid Greener: Fanny Bien Other Clinician: Referring Hyland Mollenkopf: Treating Glee Lashomb/Extender:Robson, Gennette Pac, Rojelio Brenner in Treatment: 138 Wound Status Wound Number: 1 Primary Diabetic Wound/Ulcer of the Lower Etiology: Extremity Wound Location: Left Knee - Posterior Wound Status: Open Wounding Event: Gradually Appeared Comorbid Type II Diabetes, Osteoarthritis Date Acquired: 03/23/2016 History: Weeks Of Treatment: 138 Clustered Wound: No Photos Wound Measurements Length: (cm) 1 Width: (cm) 1.3 Depth: (cm) 0.1 Area: (cm) 1.021 Volume: (cm) 0.102 Wound Description Classification: Grade 2 Wound Margin: Flat and Intact Exudate Amount: Medium Exudate Type: Serosanguineous Exudate Color: red, brown Wound Bed Granulation Amount: Large (67-100%) Granulation Quality: Red Necrotic Amount: Small (1-33%) Necrotic Quality: Adherent Slough fter Cleansing: No ino Yes Exposed Structure ed: No ubcutaneous Tissue) Exposed: Yes ed: No ed: No d: No : No % Reduction in Area: 96.3% % Reduction in Volume: 99.6% Epithelialization: Small (1-33%) Tunneling: No Undermining: No Foul Odor A Slough/Fibr Fascia Expos Fat Layer (S Tendon Expos Muscle Expos Joint Expose Bone Exposed Treatment Notes Wound #1 (Left, Posterior Knee) 1. Cleanse With Wound Cleanser 3. Primary Dressing Applied Calcium Alginate Ag 4. Secondary Dressing ABD Pad 5. Secured With Recruitment consultant) Signed: 05/31/2019 11:25:39 AM By: Mikeal Hawthorne EMT/HBOT Signed: 05/31/2019 12:05:21 PM By: Deon Pilling Previous Signature: 05/30/2019 5:32:49 PM  Version By: Deon Pilling Entered By: Mikeal Hawthorne on 05/31/2019 10:20:06 -------------------------------------------------------------------------------- Wound Assessment Details Patient Name: Date of Service: Danna Hefty 05/30/2019 2:15 PM Medical Record [id number] Patient Account Number: [id number] Date of Birth/Sex: Treating RN: [birth year] (53 y.o. Helene Shoe, Meta.Reding Primary Care Bridget Bainbridge: Fanny Bien Other Clinician: Referring Aundre Hietala: Treating Ata Pecha/Extender:Robson, Gennette Pac, Rojelio Brenner in Treatment: 138 Wound Status Wound Number: 3 Primary Etiology: Lymphedema Wound Location: Left Lower Leg - Medial Wound Status: Open Wounding Event: Gradually Appeared Comorbid History: Type II Diabetes, Osteoarthritis Date Acquired: 01/09/2019 Weeks Of Treatment: 17 Clustered Wound: No Photos Wound Measurements Length: (cm) 5.2 % Reduct Width: (cm) 9 % Reduct Depth: (cm) 0.1 Epitheli Area: (cm) 36.757 Tunneli Volume: (cm) 3.676 Undermi Wound Description Classification: Full Thickness Without Exposed Support Foul Odo Structures Slough/F Wound Thickened Margin: Exudate Large Amount: Exudate Purulent Type: Exudate yellow, brown, green Color: Wound Bed Granulation Amount: Large (67-100%) Granulation Quality: Pink Fascia E Necrotic Amount: None Present (0%) Fat L Tendo Muscl Joint Bone r After Cleansing: No ibrino No Exposed Structure xposed: No ayer (Subcutaneous Tissue) Exposed: Yes n Exposed: No e Exposed: No Exposed: No Exposed: No ion in Area: -9447.3% ion in Volume: -9573.7% alization: None ng: No ning: No Treatment Notes Wound #3 (Left, Medial Lower Leg) 1. Cleanse With Wound Cleanser 3. Primary Dressing Applied Calcium Alginate Ag 4. Secondary Dressing ABD Pad 5. Secured With Recruitment consultant) Signed: 05/31/2019 11:25:39 AM By: Mikeal Hawthorne EMT/HBOT Signed: 05/31/2019 12:05:21 PM By: Deon Pilling Previous Signature: 05/30/2019 5:32:49 PM Version By: Deon Pilling Entered By: Mikeal Hawthorne on 05/31/2019 10:19:31 -------------------------------------------------------------------------------- Vitals Details Patient Name: Date of Service: Sierra Ao T. 05/30/2019 2:15 PM Medical Record [id number] Patient Account Number: [id number] Date of Birth/Sex: Treating RN: 11-16-1965 (53 y.o. Helene Shoe, Tammi Klippel Primary Care Anyelo Mccue: Fanny Bien Other Clinician: Referring Kimarie Coor: Treating Roxan Yamamoto/Extender:Robson, Gennette Pac, Benjamine Mola  R Weeks in Treatment: 138 Vital Signs Time Taken: 15:11 Temperature (F): 98.2 Height (in): 70 Pulse (bpm): 110 Weight (lbs): 484 Respiratory Rate (breaths/min): 20 Body Mass Index (BMI): 69.4 Blood Pressure (mmHg): 190/77 Reference Range: 80 - 120 mg / dl Notes rechecked x2 still 190/77. per patient nervous about being out and pain 9/10 generalized. Electronic Signature(s) Signed: 05/30/2019 5:32:49 PM By: Deon Pilling Entered By: Deon Pilling on 05/30/2019 15:12:26

## 2019-05-31 NOTE — Progress Notes (Signed)
SEVILLE, WANTZ ([id number]) Visit Report for 05/30/2019 HPI Details Patient Name: Date of Service: [name], [name] 05/30/2019 2:15 PM Medical Record [id number] Patient Account Number: [id number] Date of Birth/Sex: Treating RN: Oct 31, 1965 (53 y.o. [name] Primary Care Provider: Fanny Bien Other Clinician: Referring Provider: Treating Provider/Extender:Ivory Bail, Gennette Pac, Rojelio Brenner in Treatment: 138 History of Present Illness Location: Left popliteal fossa Duration: She was aware of the wound October 2017 Context: The development/occurrence is unclear Modifying Factors: Chronic pressure secondary to thigh pannus HPI Description: 10/01/16- She is here for initial evaluation of her left popliteal fossa ulcer. She states that she noticed the area in October 2017. She is unaware of actual causative factor, states she has a history of eczema and uses a brush for bathing. She was being treated by her PCP, using Aquaphor since then, and stopped using last week. She denies ever being on an antibiotic for this ulcer. She states that this drains excessively and soaks through her clothes, she is unable to visualize this area and has a friend that helps with the dressing changes. She was recently diagnosed with diabetes; A1c in December was 9; down to 7.5-8 in February. She is being managed on oral agents. She is a non-smoker. She has never been evaluated for venous reflux or lymphedema. 10/08/16- She is here in follow up regarding her left popliteal fossa pressure ulcer. She states that she did not receive any supplies as ordered. She states that the dressing material that was ordered and placed last week was in place for several days. She states that the odor and drainage is unchanged. She states that her PCP has written for 1 month off work, starting Monday 4/23. The culture obtained last week was mixed skin flora, no identifiable pathogen. 10/15/16;  difficult wound in the left popliteal fossa in the setting of morbid obesity and lymphedema. We have been using silver alginate although it is not really clear to me that this stays on for very long. She changes it once. Covering with AVD pads to try and separate wound from the surrounding pannus. A culture of this area showed both enterococcus and Enterobacter and I have her on a combination of amoxicillin and cipro. Although most Gram positives will be covered by Cipro, a big exception is Enterobacter particularly Enterobacter faecalis which generally requires ampicillin if indeed it is sensitive. A final issue is that the patient is limited by the co-pay on her insurance with regards to actual wound care supplies. I didn't get into this in much detail although he crossed my mind that we may need to bring her back more than once a week for dressing changes. 10/22/16; difficult wound in the left popliteal fossa. I think this is a lymphedema type issue in the middle lobe folds in the back of her knee. We have been using silver alginate, ABD pads. Dimensions today are improved, base of the wound is improved. I had some thoughts about biopsying this however overall this is improved today and all put that off for now 10/29/16; difficult wound in the left popliteal fossa which I think is a pressure wound from associated large folds of lymphedema. We have been using silver alginate. 11/05/16; difficult wound in the left popliteal fossa. Using silver alginate change 3x/wk. Wound measuring slightly smaller 11/12/16; difficult wound in the setting of chronic lymphedema. Left popliteal fossa. Using so over alginate change 3 times a week, once here and wants by home health and/or the patient's friend who  is a Marine scientist. The dimensions of this appear to be improving, the surface is certainly improved [id number]; Patient now has lymphedema pumps. wound is slightly reduced in width. Still using silver alginate 11/26/16;  patient has lymphedema pumps although she is having trouble with them in terms of fit. Nevertheless she tells me she is using them. She is still using silver alginate as the primary dressing 12/03/16; patient has lymphedema pumps and there is been significant improvement in the swelling in the leg. We've been using silver alginate but really only making minimal improvements if any in the overall wound area although the base of the wound is a lot better than when she first came here. 12/10/16; patient has increasing pain, drainage and odor this week. States this began to get worse over the weekend. She is been using her pumps at home and the edema is certainly better in the leg. We changed to [facility] Blue last week. She has home health coming out once a week to change the dressing 12/17/16; culture I did last week showed a few MSSA. We called her in cephalexin at 500 4 times a day. She admits to less than 100% compliance with the 4 times a day dosing. I have encouraged her today. The surrounding tenderness and erythema in this wound is a lot better. Nursing reports that the diameter of the wound is down a centimeter. Healthy-looking wound base no surrounding tenderness. We continue to use silver alginate 12/24/16; she is still completing her antibiotics apparently not taking them exactly as prescribed. The wound is perhaps marginally smaller. Surface of this continues to look not 100% viable. We have been using silver collagen. Consider changing to Iodoflex 12/31/16; she is finished her antibiotics. There is no evidence to suspect ongoing infection in the wound. We have looked back over the pictures of the wound in the left popliteal fossa. She came in with a thick adherent necrotic eschar and now we are dealing with slight surface slough. No debridement will be planned today. I'm going to try [facility] in lieu of the silver alginate starting today 01/07/17; patient continues to do well using  Hydrofera Blue. Reliably using her external compression pumps twice a day 01/15/17; the patient has been using [facility] with a general improvement in her wound condition and size. However she went back to work yesterday it would seem that her use of the compression pumps this week is been intermittent and at work she is unable to change the dressings. Per our intake nurse arrived with a macerated dressing in place. 01/22/17 on evaluation today patient appears to be doing a little bit worse. She is having more discomfort and there's a erythema surrounding the wound bed behind her left knee. She states this discomfort often is associated with infection and in fact this wound looks to likely be infected. She has no fevers, chills, nausea, vomiting, or diarrhea. 02/12/17; the patient's wound is not as good as I thought I would find it. She was making a good improvement when I saw her a month ago. She went back to work and largely she blames difficulties at work for this. Apparently during my absence she was treated with oral antibiotics starting on 8/3. I don't believe there are any cultures 02/19/17; patient arrives in clinic today with the wound is essentially unchanged in terms of dimensions and wound appearance. We have been using silver alginate. She claims compliance with the compression pumps twice a day 02/25/17; no major change in the  condition of the wound. We used Iodo flex last week with the purpose of trying to get a better-looking surface. She could not tolerate this because of discomfort. She found this especially bad when she used her compression pumps. Before this we use silver alginate again with not a lot of success. I changed her to [employer] which will certainly not have much debridement activity however I will probably change to weekly mechanical debridements. This will help with the drainage. She is now at home/on short-term disability. 03/11/17; the patient is now on  disability. She is been using PolyMem AG wound for the first time looks a little better this week and dimensions have improved. She is compliant with her external compression pumps 03/18/16; the patient is now on disability at home using her palms. We've been using PolyMen AG. Wound certainly looks smaller. Arrives today with an adherent surface requiring debridement. 04/08/17; patient has not been here in 3 weeks. Using Anasept with PolyMem AG. She arrives today with an adherent surface over the wound. She did not wish debridement, there was edema fluid dripping through the wound surface even though she states she is using her external compression pumps 04/15/17; patient is using Anasept with PolyMem and AG. She arrives today telling us that her leg is been hurting since a home care nurse staff her dressing into her on Sunday therefore she could not use her compression pumps in spite of this her dimensions appear to be better although this is a very difficult wound to measure in the popliteal fossa of the left knee 04/22/17; arrives today in clinic using Anasept and PolyMem AG. She is using her compression pumps per her description. The wound surface however once again is not going to allow healing. She still has weeping edema fluid coming through the wound. No major change in dimensions 04/29/17; arrives today with the major deterioration in the wound both in terms of dimensions and condition of the wound surface. She reports increasing pain since earlier in the week at our intake nurse reports purulent drainage on the ABD pads.this is not going in the right direction. We put Hydrofera Blue on this last week but I'm going to change back to [employer] because of the drainage. I reviewed previous cultures last done in June showed methicillin sensitive staph aureus. We had been making progress with this wound presenting initially with a necrotic wound on the popliteal fossa on the right. We  managed to get a viable surface and some reduction in wound dimensions however in the past 4 weeks this is been getting gradually worse and today much worse in terms of both size and condition of the wound surface. The patient is having pain and will require empiric antibiotics because of the purulent drainage and deterioration in the wound 05/20/17; I haven't seen this patient in 3 weeks. Biopsy of this wound I did last time suggested extensive granulation tissue with collections of neutrophils lymphocytes plasma cells histiocytes and multinucleated giant cells. There was no evidence of malignancy. Deep tissue culture I did showed MSSA and I gave her Keflex when she was here last time and renewed this for a week however she is still taking the medication I'm not certain she actually took it properly.the pathologist suggested the possibility of pyoderma gangrenosum, a vasculopathy. PAS stains and acid-fast stains were negative She also tells me that she is having mobility problems secondary to pain in both knees. She uses a walker but can barely make it to  the bathroom. It sounds as though she has a history of chronic osteoarthritis and she tells me that she had a left anterior cruciate ligament tear at some time in the past 06/10/17; this is a patient that I haven't seen in 3 weeks. She is completed her antibiotics. Biopsy suggested the possibility of infection versus pyoderma gangrenosum. I gave her 2 weeks of Keflex directed at [facility] she is repeated this.she called last week to request more antibiotics but I wanted to see her. She arrives today with her aunt. Apparently the patient has deteriorated in terms of function. According the patient and her aunt she can no longer ambulate or easily transfer. She is incontinent because she has urge incontinence etc. They requested admission to a rehabilitation facility or the hospital and then a rehabilitation facility. I was not aware that the  patient was declining in terms a shin but according to our staff that's been the case they haven't seen her walk in the him any months that she's been here. I wasn't really aware of this. In fact the patient was working up until the end of August. In any case she arrives with the wound a lot worse 07/01/17; this is a patient I haven't seen in 3 weeks. The last time she was here at the request of her family member we sent her to the ER. She apparently was kept overnight. She was treated for UTI. Attempts were made to find her long-term care although there was insurance and cost issues and she is back home. She now has her aunt living with her. I am not clear what they are using to the wound on the posterior left knee/popliteal fossa. Last biopsy I did of this area did not document pyoderma which had been raised by another/previous biopsy. We are not clear today what they're actually putting on the wound 07/15/17; she arrives today with improvement in her wound dimensions although there is still weeping edema fluid. She claims she is changing the dressing [silver alginate] twice daily. I've suggested increasing this to 3 times a day. She is changing the silver alginate twice a day 07/29/17; patient complains of pain drainage and odor coming from the wound which was different from 2 weeks ago.. We have been using silver alginate she has been compliant with her compression pumps 09/02/17; the patient continues to complain of pain and drainage. I gave her empiric antibiotics when she was last here 5 weeks ago but she says she only completed them recently. She seems confused about the time frame of her last visit here.she states she was concerned about an odor and stopped using the [facility] Blue on the wound in her left popliteal fossa about a week ago. I don't think she is dressing this with anything since. She is using her lymphedema pumps once a day.previous biopsy of done of this area showed  inflammation associated with stasis dermatitis or trauma She is still not walking apparently because of severe osteoarthritis in both knees. She thinks she'll be going back to work in June and she needs to be ambulatory although I really can't see this happening. 09/16/17; the patient is been using a mixture of Hydrofera Blue and PolyMen that she had left over. I'm not really sure how reliable this is. She tells me she is using her external compression pumps once a day. She had a steroid shot in her left knee this morning 09/30/17; 2 week follow-up for this patient with a difficult wound behind her left  popliteal fossa. She has home health. They're using Hydrofera Blue. Measurement slightly smaller. She states she is using her compression pumps once a day 10/14/17; 2 week follow-up for this woman with a difficult wound with find her left popliteal fossa. She has home health. Using Hydrofera Blue. Measurements again today slightly smaller. Most of the surface of this covered in a nonviable tightly green adherent material. Some of this may be retained Hydrofera Blue nevertheless was debrided with great difficulty. She asked me about her upcoming disability which I think happens in June. I told her that from my point of view the wound itself does not qualify her to be disabled by itself. Now I'm aware that she can no longer stand and walk. This really deteriorated quite a bit during the course of the time she is been here. I suspect this is due to severe osteoarthritis of both knees and she is seen orthopedics for this. I think this is a source of her disability but I don't really feel comfortable with this given the fact that I don't precisely know the exact diagnosis here. 10/28/17; 2 week follow-up for this woman with a difficult wound in her left popliteal fossa. This is complicated by lymphedema. She is episodic in using her compression pumps we obtained. We've been using Hydrofera Blue. Dimensions  of the wound are not any different however surface of this looks a lot better than last time. Prior to last visit at which time I had the debridement the wound, dimensions were slightly smaller. The patient came in last time talking to me about disability. I sent her to see her primary physician who I thought would do a proper evaluation for this patient with regards to her disability thoughts. The patient is able to stand and transfer but is no longer ambulatory. She has a wheelchair at home.Marland Kitchen She works as a Freight forwarder for [employer]. She can no longer drive. 11/08/17; this is a 2 week follow-up for patient with a difficult wound in the left popliteal fossa complicated by lymphedema. I am not sure about her compliance with the compression pumps we've been using Hydrofera Blue for 2 months now and we have been making decent progress however once again today she arrives with 50% of the overall wound area covered in tightly adherent black/green necrotic tissue. 12/02/17; difficult wound in the left popliteal fossa complicated by severe lymphedema. We've been using silver alginate although the patient ran out of this and she's been using Hydrofera Blue. Last time she is here she had a considerable amount of tightly adherent green necrotic tissue. Post debridement culture showed MSSA, I gave her 7 days worth of Septra DS which seems to have helped 12/24/17; almost palliative follow-up for this difficult area on the left popliteal fossa complicated by severe lymphedema. We've been using silver alginate although the patient tells me she's been using Hydrofera Blue. She does not separate the folds of this wound. She has been using her lymphedema pumps but not every day. 01/20/18 This is a difficult wound in the left popliteal fossa. Intake nurse noted some purulent drainage and necrotic material. She has severe lymphedema including the posterior thigh and the upper calf surrounding this wound and there is continued  leaking edema fluid out of the wound surface. This is not going to heal like this. She states she is being compliant with the lymphedema pumps once sometimes twice a day. However this is not controlling the edema around the wound either in the calf or  the posterior thigh 02/10/18; really not making any progress here. The wound is actually larger. There is no purulent drainage. Culture I did last time was negative/multiple organisms 03/10/18; 1 month follow-up. The wound may be measures slightly smaller. We've been using care max directly on the wound surface. The area definitely looks less moist. She tells me she is also you been using her pumps religiously twice a day. In fact the area actually looks dry today. 04/12/2018; 1 month follow-up. She states after I used silver nitrate on this wound area a month ago that the pain was intolerable and continued all month. This made it too painful for her to use the care of max reliably or to use her compression pumps. She arrives in clinic today with her wound probably twice the size. There is no purulent drainage 05/03/2018; the patient has been using her pumps reliably twice a day over the last week but she finds the combination of the pump and a dressing on the wound to be unduly painful therefore there is not been a dressing in the last week. She arrives in clinic today with the wound continuing to enlarge. She did not tolerate silver nitrate nor Kerramax directly on the wound 05/26/2018; the patient has not been using her pumps. Her wound in the left posterior popliteal fossa continues to expand. We are using silver alginate although the options for primary dressings are limited since she is paying out- of-pocket for this. Culture I did last time which was a PCR culture showed a scattered number of anaerobes low quantities. I did not think this was significant. 06/09/2018 the patient has not been using her pumps. She puts care of max in the area on  the posterior popliteal fossa. She has no one to help her clean this area. Home health is apparently coming by once a week. For some reason she has to purchase a lot of her supplies herself online. She does not have a lot of financial resources. She states the pumps hurt her and caused the wound to bleed. She is continuing to deteriorate Biopsy I did last visit did not show malignancy/fungus or atypical Mycobacterium. This is a second time I have biopsied this 06/30/2018; the patient states she is using her pumps at least once a day. She put silver alginate and a rolled towel in the area. She occasionally uses kerramax and/or ABD pads. She is decided that a application of silver nitrate I did because of very friable mucosa on this sometime in September is the reason that she could not use the pumps. I did not actually research this. This is clearly not the case 1/30; the patient states she is using her pumps once a day but sometimes for as long as 2 hours. I told her that she still needs to do this and additional time a day. She is using silver alginate. She states the [facility] hurts when she uses her pumps therefore she is not using this. She has ABD pads that she is getting from home health. Dimensions perhaps slightly improved 3/5; the patient uses her pumps once a day but she states for is sometimes as long as 2 hours. She has not managed to do this twice a day. She is using silver alginate. Separating these areas with ABDs and rolled towels. There is no change in dimensions of this wound if anything slightly larger although the surfaces look better 4/2; I do not see too much difference 1 way or the other in  the area on the left popliteal fossa. She is not using her pumps. She did describe an episode of pain and odor a week or 2 ago however she said it is mostly subsided. Still using silver alginate. 5/8; monthly follow-up for this large area in the left popliteal fossa. She is not using her  compression pumps there is surrounding lymphedema from tissue in the posterior thigh in the posterior calf. Her home health nurse had called 2 weeks ago to report black drainage on the surface, she used silver alginate after that and it seems to have cleared up. 6/4; Patient is not using pumps. Finds silver alginate painful. I think she is just using ABD's in the area. She has a new wound laterally to the original wound. Leaking copious lymphedema 7/9; monthly follow-up. Patient is using her pumps about once a week finds it painful. She uses some combination of silver calcium alginate. She has to buy her own supplies [place] and she uses an ABD. She has home health but her co-pay is such that it is cheaper for her to buy her supplies online 8/6-Patient is here for her monthly follow-up, she has started using her pumps again, apparently a couple of weeks ago she had a lot of pain and could not use the pumps, she is now using silver alginate, she states her left posterior calf wound had some drainage that was greenish now converted to serous 10/13; 17-month follow-up. The patient's wound is on the left popliteal fossa. Fairly substantial area in the middle of 2 pannus folds the lower part of her thigh and the upper part of her calf. She has been using silver alginate. As usual she presents with greenish drainage but the wound does not look infected 12/8; 8-month follow-up. The patient now has 2 wounds in the popliteal fossa on the left. Fairly substantial area in the middle of 2 pannus folds in the lower part of her thigh and the upper part of her calf. She has been using silver alginate. There is a smaller wound laterally and posteriorly very difficult to see. The patient says she is using her compression pumps 3 times a week. She is not willing to separate the folds in this wound area. I am not sure I really understand the issue. Electronic Signature(s) Signed: 05/30/2019 5:53:37 PM By: Linton Ham MD Entered By: Linton Ham on 05/30/2019 17:35:25 -------------------------------------------------------------------------------- Physical Exam Details Patient Name: Date of Service: [name] 05/30/2019 2:15 PM Medical Record [id number] Patient Account Number: [id number] Date of Birth/Sex: Treating RN: [birth year] (53 y.o. [name] Primary Care Provider: Fanny Bien Other Clinician: Referring Provider: Treating Provider/Extender:Peyten Punches, Gennette Pac, Rojelio Brenner in Treatment: 138 Notes Wound exam; substantial area in the left posterior fossa. In some ways this surface of this wound actually looks better. She has a small satellite area posteriorly and laterally that is very difficult to see in our clinic because of the small size of our exam tables. She has no evidence of infection or tenderness. There is poor control of the lymphedema around the wound area. I do not believe there is any way to heal this in this state Electronic Signature(s) Signed: 05/30/2019 5:53:37 PM By: Linton Ham MD Entered By: Linton Ham on 05/30/2019 17:36:38 -------------------------------------------------------------------------------- Physician Orders Details Patient Name: Date of Service: [name] 05/30/2019 2:15 PM Medical Record [id number] Patient Account Number: [id number] Date of Birth/Sex: Treating RN: Oct 11, 1965 (54 y.o. [name] Primary Care Provider: Rachell Cipro  R Other Clinician: Referring Provider: Treating Provider/Extender:Ryli Standlee, Gennette Pac, Rojelio Brenner in Treatment: 138 Verbal / Phone Orders: No Diagnosis Coding ICD-10 Coding Code Description I87.322 Chronic venous hypertension (idiopathic) with inflammation of left lower extremity I89.0 Lymphedema, not elsewhere classified L97.221 Non-pressure chronic ulcer of left calf limited to breakdown of skin E11.622 Type 2 diabetes mellitus with  other skin ulcer E66.09 Other obesity due to excess calories Follow-up Appointments Other: - 2 months Dressing Change Frequency Wound #1 Left,Posterior Knee Change dressing every day. Wound Cleansing May shower and wash wound with soap and water. - also cleanse with Vashe solution Primary Wound Dressing Wound #1 Left,Posterior Knee Calcium Alginate with Silver Wound #3 Left,Medial Lower Leg Calcium Alginate with Silver Secondary Dressing Wound #1 Left,Posterior Knee Kerlix/Rolled Gauze ABD pad - or incontinence pads secure with transpore tape Edema Control Avoid standing for long periods of time Elevate legs to the level of the heart or above for 30 minutes daily and/or when sitting, a frequency of: - throughout the day Exercise regularly Segmental Compressive Device. - lymphedema pumps 60 minutes 2 times per day! Off-Loading Other: - place rolled towel or washcloth in skin fold behind left knee to separate skin in fold Additional Orders / Instructions Follow Nutritious Diet - increase protein and vegetables to aid in wound healing. [facility] for wound care. - [place]. 3 times per week; patient is homebound. Electronic Signature(s) Signed: 05/30/2019 5:53:37 PM By: Linton Ham MD Signed: 05/31/2019 12:07:41 PM By: Carlene Coria RN Entered By: Carlene Coria on 05/30/2019 15:44:41 -------------------------------------------------------------------------------- Problem List Details Patient Name: Date of Service: [name] 05/30/2019 2:15 PM Medical Record [id number] Patient Account Number: [id number] Date of Birth/Sex: Treating RN: [birth year] (53 y.o. [name] Primary Care Provider: Fanny Bien Other Clinician: Referring Provider: Treating Provider/Extender:Lore Polka, Gennette Pac, Rojelio Brenner in Treatment: 138 Active Problems ICD-10 Evaluated Encounter Code Description Active Date Today  Diagnosis I87.322 Chronic venous hypertension (idiopathic) with 11/05/2016 No Yes inflammation of left lower extremity I89.0 Lymphedema, not elsewhere classified 10/09/2016 No Yes L97.221 Non-pressure chronic ulcer of left calf limited to 11/05/2016 No Yes breakdown of skin E11.622 Type 2 diabetes mellitus with other skin ulcer 10/01/2016 No Yes E66.09 Other obesity due to excess calories 10/01/2016 No Yes Inactive Problems Resolved Problems Electronic Signature(s) Signed: 05/30/2019 5:53:37 PM By: Linton Ham MD Entered By: Linton Ham on 05/30/2019 17:33:25 -------------------------------------------------------------------------------- Progress Note Details Patient Name: Date of Service: [name] 05/30/2019 2:15 PM Medical Record [id number] Patient Account Number: [id number] Date of Birth/Sex: Treating RN: 07/26/1965 (53 y.o. [name] Primary Care Provider: Fanny Bien Other Clinician: Referring Provider: Treating Provider/Extender:Tate Jerkins, Gennette Pac, Rojelio Brenner in Treatment: 138 Subjective History of Present Illness (HPI) The following HPI elements were documented for the patient's wound: Location: Left popliteal fossa Duration: She was aware of the wound October 2017 Context: The development/occurrence is unclear Modifying Factors: Chronic pressure secondary to thigh pannus 10/01/16- She is here for initial evaluation of her left popliteal fossa ulcer. She states that she noticed the area in October 2017. She is unaware of actual causative factor, states she has a history of eczema and uses a brush for bathing. She was being treated by her PCP, using Aquaphor since then, and stopped using last week. She denies ever being on an antibiotic for this ulcer. She states that this drains excessively and soaks through her clothes, she is unable to visualize this area and  has a friend that helps with the dressing changes. She was recently  diagnosed with diabetes; A1c in December was 9; down to 7.5-8 in February. She is being managed on oral agents. She is a non-smoker. She has never been evaluated for venous reflux or lymphedema. 10/08/16- She is here in follow up regarding her left popliteal fossa pressure ulcer. She states that she did not receive any supplies as ordered. She states that the dressing material that was ordered and placed last week was in place for several days. She states that the odor and drainage is unchanged. She states that her PCP has written for 1 month off work, starting Monday 4/23. The culture obtained last week was mixed skin flora, no identifiable pathogen. 10/15/16; difficult wound in the left popliteal fossa in the setting of morbid obesity and lymphedema. We have been using silver alginate although it is not really clear to me that this stays on for very long. She changes it once. Covering with AVD pads to try and separate wound from the surrounding pannus. A culture of this area showed both enterococcus and Enterobacter and I have her on a combination of amoxicillin and cipro. Although most Gram positives will be covered by Cipro, a big exception is Enterobacter particularly Enterobacter faecalis which generally requires ampicillin if indeed it is sensitive. A final issue is that the patient is limited by the co-pay on her insurance with regards to actual wound care supplies. I didn't get into this in much detail although he crossed my mind that we may need to bring her back more than once a week for dressing changes. 10/22/16; difficult wound in the left popliteal fossa. I think this is a lymphedema type issue in the middle lobe folds in the back of her knee. We have been using silver alginate, ABD pads. Dimensions today are improved, base of the wound is improved. I had some thoughts about biopsying this however overall this is improved today and all put that off for now 10/29/16; difficult wound in  the left popliteal fossa which I think is a pressure wound from associated large folds of lymphedema. We have been using silver alginate. 11/05/16; difficult wound in the left popliteal fossa. Using silver alginate change 3x/wk. Wound measuring slightly smaller 11/12/16; difficult wound in the setting of chronic lymphedema. Left popliteal fossa. Using so over alginate change 3 times a week, once here and wants by home health and/or the patient's friend who is a Marine scientist. The dimensions of this appear to be improving, the surface is certainly improved [id number]; Patient now has lymphedema pumps. wound is slightly reduced in width. Still using silver alginate 11/26/16; patient has lymphedema pumps although she is having trouble with them in terms of fit. Nevertheless she tells me she is using them. She is still using silver alginate as the primary dressing 12/03/16; patient has lymphedema pumps and there is been significant improvement in the swelling in the leg. We've been using silver alginate but really only making minimal improvements if any in the overall wound area although the base of the wound is a lot better than when she first came here. 12/10/16; patient has increasing pain, drainage and odor this week. States this began to get worse over the weekend. She is been using her pumps at home and the edema is certainly better in the leg. We changed to [facility] Blue last week. She has home health coming out once a week to change the dressing 12/17/16; culture I did last  week showed a few MSSA. We called her in cephalexin at 500 4 times a day. She admits to less than 100% compliance with the 4 times a day dosing. I have encouraged her today. The surrounding tenderness and erythema in this wound is a lot better. Nursing reports that the diameter of the wound is down a centimeter. Healthy-looking wound base no surrounding tenderness. We continue to use silver alginate 12/24/16; she is still completing her  antibiotics apparently not taking them exactly as prescribed. The wound is perhaps marginally smaller. Surface of this continues to look not 100% viable. We have been using silver collagen. Consider changing to Iodoflex 12/31/16; she is finished her antibiotics. There is no evidence to suspect ongoing infection in the wound. We have looked back over the pictures of the wound in the left popliteal fossa. She came in with a thick adherent necrotic eschar and now we are dealing with slight surface slough. No debridement will be planned today. I'm going to try [facility] in lieu of the silver alginate starting today 01/07/17; patient continues to do well using Hydrofera Blue. Reliably using her external compression pumps twice a day 01/15/17; the patient has been using [facility] with a general improvement in her wound condition and size. However she went back to work yesterday it would seem that her use of the compression pumps this week is been intermittent and at work she is unable to change the dressings. Per our intake nurse arrived with a macerated dressing in place. 01/22/17 on evaluation today patient appears to be doing a little bit worse. She is having more discomfort and there's a erythema surrounding the wound bed behind her left knee. She states this discomfort often is associated with infection and in fact this wound looks to likely be infected. She has no fevers, chills, nausea, vomiting, or diarrhea. 02/12/17; the patient's wound is not as good as I thought I would find it. She was making a good improvement when I saw her a month ago. She went back to work and largely she blames difficulties at work for this. Apparently during my absence she was treated with oral antibiotics starting on 8/3. I don't believe there are any cultures 02/19/17; patient arrives in clinic today with the wound is essentially unchanged in terms of dimensions and wound appearance. We have been using silver  alginate. She claims compliance with the compression pumps twice a day 02/25/17; no major change in the condition of the wound. We used Iodo flex last week with the purpose of trying to get a better-looking surface. She could not tolerate this because of discomfort. She found this especially bad when she used her compression pumps. Before this we use silver alginate again with not a lot of success. I changed her to [employer] which will certainly not have much debridement activity however I will probably change to weekly mechanical debridements. This will help with the drainage. She is now at home/on short-term disability. 03/11/17; the patient is now on disability. She is been using PolyMem AG wound for the first time looks a little better this week and dimensions have improved. She is compliant with her external compression pumps 03/18/16; the patient is now on disability at home using her palms. We've been using PolyMen AG. Wound certainly looks smaller. Arrives today with an adherent surface requiring debridement. 04/08/17; patient has not been here in 3 weeks. Using Anasept with PolyMem AG. She arrives today with an adherent surface over the wound. She  did not wish debridement, there was edema fluid dripping through the wound surface even though she states she is using her external compression pumps 04/15/17; patient is using Anasept with PolyMem and AG. She arrives today telling us that her leg is been hurting since a home care nurse staff her dressing into her on Sunday therefore she could not use her compression pumps in spite of this her dimensions appear to be better although this is a very difficult wound to measure in the popliteal fossa of the left knee 04/22/17; arrives today in clinic using Anasept and PolyMem AG. She is using her compression pumps per her description. The wound surface however once again is not going to allow healing. She still has weeping edema fluid coming through  the wound. No major change in dimensions 04/29/17; arrives today with the major deterioration in the wound both in terms of dimensions and condition of the wound surface. She reports increasing pain since earlier in the week at our intake nurse reports purulent drainage on the ABD pads.this is not going in the right direction. We put Hydrofera Blue on this last week but I'm going to change back to [employer] because of the drainage. I reviewed previous cultures last done in June showed methicillin sensitive staph aureus. We had been making progress with this wound presenting initially with a necrotic wound on the popliteal fossa on the right. We managed to get a viable surface and some reduction in wound dimensions however in the past 4 weeks this is been getting gradually worse and today much worse in terms of both size and condition of the wound surface. The patient is having pain and will require empiric antibiotics because of the purulent drainage and deterioration in the wound 05/20/17; I haven't seen this patient in 3 weeks. Biopsy of this wound I did last time suggested extensive granulation tissue with collections of neutrophils lymphocytes plasma cells histiocytes and multinucleated giant cells. There was no evidence of malignancy. Deep tissue culture I did showed MSSA and I gave her Keflex when she was here last time and renewed this for a week however she is still taking the medication I'm not certain she actually took it properly.the pathologist suggested the possibility of pyoderma gangrenosum, a vasculopathy. PAS stains and acid-fast stains were negative She also tells me that she is having mobility problems secondary to pain in both knees. She uses a walker but can barely make it to the bathroom. It sounds as though she has a history of chronic osteoarthritis and she tells me that she had a left anterior cruciate ligament tear at some time in the past 06/10/17; this is a patient that  I haven't seen in 3 weeks. She is completed her antibiotics. Biopsy suggested the possibility of infection versus pyoderma gangrenosum. I gave her 2 weeks of Keflex directed at [facility] she is repeated this.she called last week to request more antibiotics but I wanted to see her. She arrives today with her aunt. Apparently the patient has deteriorated in terms of function. According the patient and her aunt she can no longer ambulate or easily transfer. She is incontinent because she has urge incontinence etc. They requested admission to a rehabilitation facility or the hospital and then a rehabilitation facility. I was not aware that the patient was declining in terms a shin but according to our staff that's been the case they haven't seen her walk in the him any months that she's been here. I wasn't really aware  of this. In fact the patient was working up until the end of August. In any case she arrives with the wound a lot worse 07/01/17; this is a patient I haven't seen in 3 weeks. The last time she was here at the request of her family member we sent her to the ER. She apparently was kept overnight. She was treated for UTI. Attempts were made to find her long-term care although there was insurance and cost issues and she is back home. She now has her aunt living with her. I am not clear what they are using to the wound on the posterior left knee/popliteal fossa. Last biopsy I did of this area did not document pyoderma which had been raised by another/previous biopsy. We are not clear today what they're actually putting on the wound 07/15/17; she arrives today with improvement in her wound dimensions although there is still weeping edema fluid. She claims she is changing the dressing [silver alginate] twice daily. I've suggested increasing this to 3 times a day. She is changing the silver alginate twice a day 07/29/17; patient complains of pain drainage and odor coming from the wound which was  different from 2 weeks ago.. We have been using silver alginate she has been compliant with her compression pumps 09/02/17; the patient continues to complain of pain and drainage. I gave her empiric antibiotics when she was last here 5 weeks ago but she says she only completed them recently. She seems confused about the time frame of her last visit here.she states she was concerned about an odor and stopped using the [facility] Blue on the wound in her left popliteal fossa about a week ago. I don't think she is dressing this with anything since. She is using her lymphedema pumps once a day.previous biopsy of done of this area showed inflammation associated with stasis dermatitis or trauma She is still not walking apparently because of severe osteoarthritis in both knees. She thinks she'll be going back to work in June and she needs to be ambulatory although I really can't see this happening. 09/16/17; the patient is been using a mixture of Hydrofera Blue and PolyMen that she had left over. I'm not really sure how reliable this is. She tells me she is using her external compression pumps once a day. She had a steroid shot in her left knee this morning 09/30/17; 2 week follow-up for this patient with a difficult wound behind her left popliteal fossa. She has home health. They're using Hydrofera Blue. Measurement slightly smaller. She states she is using her compression pumps once a day 10/14/17; 2 week follow-up for this woman with a difficult wound with find her left popliteal fossa. She has home health. Using Hydrofera Blue. Measurements again today slightly smaller. Most of the surface of this covered in a nonviable tightly green adherent material. Some of this may be retained Hydrofera Blue nevertheless was debrided with great difficulty. She asked me about her upcoming disability which I think happens in June. I told her that from my point of view the wound itself does not qualify her to be  disabled by itself. Now I'm aware that she can no longer stand and walk. This really deteriorated quite a bit during the course of the time she is been here. I suspect this is due to severe osteoarthritis of both knees and she is seen orthopedics for this. I think this is a source of her disability but I don't really feel comfortable with this given  the fact that I don't precisely know the exact diagnosis here. 10/28/17; 2 week follow-up for this woman with a difficult wound in her left popliteal fossa. This is complicated by lymphedema. She is episodic in using her compression pumps we obtained. We've been using Hydrofera Blue. Dimensions of the wound are not any different however surface of this looks a lot better than last time. Prior to last visit at which time I had the debridement the wound, dimensions were slightly smaller. The patient came in last time talking to me about disability. I sent her to see her primary physician who I thought would do a proper evaluation for this patient with regards to her disability thoughts. The patient is able to stand and transfer but is no longer ambulatory. She has a wheelchair at home.Marland Kitchen She works as a Freight forwarder for [employer]. She can no longer drive. 11/08/17; this is a 2 week follow-up for patient with a difficult wound in the left popliteal fossa complicated by lymphedema. I am not sure about her compliance with the compression pumps we've been using Hydrofera Blue for 2 months now and we have been making decent progress however once again today she arrives with 50% of the overall wound area covered in tightly adherent black/green necrotic tissue. 12/02/17; difficult wound in the left popliteal fossa complicated by severe lymphedema. We've been using silver alginate although the patient ran out of this and she's been using Hydrofera Blue. Last time she is here she had a considerable amount of tightly adherent green necrotic tissue. Post debridement culture  showed MSSA, I gave her 7 days worth of Septra DS which seems to have helped 12/24/17; almost palliative follow-up for this difficult area on the left popliteal fossa complicated by severe lymphedema. We've been using silver alginate although the patient tells me she's been using Hydrofera Blue. She does not separate the folds of this wound. She has been using her lymphedema pumps but not every day. 01/20/18 This is a difficult wound in the left popliteal fossa. Intake nurse noted some purulent drainage and necrotic material. She has severe lymphedema including the posterior thigh and the upper calf surrounding this wound and there is continued leaking edema fluid out of the wound surface. This is not going to heal like this. She states she is being compliant with the lymphedema pumps once sometimes twice a day. However this is not controlling the edema around the wound either in the calf or the posterior thigh 02/10/18; really not making any progress here. The wound is actually larger. There is no purulent drainage. Culture I did last time was negative/multiple organisms 03/10/18; 1 month follow-up. The wound may be measures slightly smaller. We've been using care max directly on the wound surface. The area definitely looks less moist. She tells me she is also you been using her pumps religiously twice a day. In fact the area actually looks dry today. 04/12/2018; 1 month follow-up. She states after I used silver nitrate on this wound area a month ago that the pain was intolerable and continued all month. This made it too painful for her to use the care of max reliably or to use her compression pumps. She arrives in clinic today with her wound probably twice the size. There is no purulent drainage 05/03/2018; the patient has been using her pumps reliably twice a day over the last week but she finds the combination of the pump and a dressing on the wound to be unduly painful therefore there is  not been  a dressing in the last week. She arrives in clinic today with the wound continuing to enlarge. She did not tolerate silver nitrate nor Kerramax directly on the wound 05/26/2018; the patient has not been using her pumps. Her wound in the left posterior popliteal fossa continues to expand. We are using silver alginate although the options for primary dressings are limited since she is paying out- of-pocket for this. Culture I did last time which was a PCR culture showed a scattered number of anaerobes low quantities. I did not think this was significant. 06/09/2018 the patient has not been using her pumps. She puts care of max in the area on the posterior popliteal fossa. She has no one to help her clean this area. Home health is apparently coming by once a week. For some reason she has to purchase a lot of her supplies herself online. She does not have a lot of financial resources. She states the pumps hurt her and caused the wound to bleed. She is continuing to deteriorate Biopsy I did last visit did not show malignancy/fungus or atypical Mycobacterium. This is a second time I have biopsied this 06/30/2018; the patient states she is using her pumps at least once a day. She put silver alginate and a rolled towel in the area. She occasionally uses kerramax and/or ABD pads. She is decided that a application of silver nitrate I did because of very friable mucosa on this sometime in September is the reason that she could not use the pumps. I did not actually research this. This is clearly not the case 1/30; the patient states she is using her pumps once a day but sometimes for as long as 2 hours. I told her that she still needs to do this and additional time a day. She is using silver alginate. She states the [facility] hurts when she uses her pumps therefore she is not using this. She has ABD pads that she is getting from home health. Dimensions perhaps slightly improved 3/5; the patient uses her pumps  once a day but she states for is sometimes as long as 2 hours. She has not managed to do this twice a day. She is using silver alginate. Separating these areas with ABDs and rolled towels. There is no change in dimensions of this wound if anything slightly larger although the surfaces look better 4/2; I do not see too much difference 1 way or the other in the area on the left popliteal fossa. She is not using her pumps. She did describe an episode of pain and odor a week or 2 ago however she said it is mostly subsided. Still using silver alginate. 5/8; monthly follow-up for this large area in the left popliteal fossa. She is not using her compression pumps there is surrounding lymphedema from tissue in the posterior thigh in the posterior calf. Her home health nurse had called 2 weeks ago to report black drainage on the surface, she used silver alginate after that and it seems to have cleared up. 6/4; Patient is not using pumps. Finds silver alginate painful. I think she is just using ABD's in the area. She has a new wound laterally to the original wound. Leaking copious lymphedema 7/9; monthly follow-up. Patient is using her pumps about once a week finds it painful. She uses some combination of silver calcium alginate. She has to buy her own supplies [place] and she uses an ABD. She has home health but her co-pay is  such that it is cheaper for her to buy her supplies online 8/6-Patient is here for her monthly follow-up, she has started using her pumps again, apparently a couple of weeks ago she had a lot of pain and could not use the pumps, she is now using silver alginate, she states her left posterior calf wound had some drainage that was greenish now converted to serous 10/13; 38-month follow-up. The patient's wound is on the left popliteal fossa. Fairly substantial area in the middle of 2 pannus folds the lower part of her thigh and the upper part of her calf. She has been using silver  alginate. As usual she presents with greenish drainage but the wound does not look infected 12/8; 1-month follow-up. The patient now has 2 wounds in the popliteal fossa on the left. Fairly substantial area in the middle of 2 pannus folds in the lower part of her thigh and the upper part of her calf. She has been using silver alginate. There is a smaller wound laterally and posteriorly very difficult to see. The patient says she is using her compression pumps 3 times a week. She is not willing to separate the folds in this wound area. I am not sure I really understand the issue. Objective Constitutional Vitals Time Taken: 3:11 PM, Height: 70 in, Weight: 484 lbs, BMI: 69.4, Temperature: 98.2 F, Pulse: 110 bpm, Respiratory Rate: 20 breaths/min, Blood Pressure: 190/77 mmHg. General Notes: rechecked x2 still 190/77. per patient nervous about being out and pain 9/10 generalized. Integumentary (Hair, Skin) Wound #1 status is Open. Original cause of wound was Gradually Appeared. The wound is located on the Left,Posterior Knee. The wound measures 1cm length x 1.3cm width x 0.1cm depth; 1.021cm^2 area and 0.102cm^3 volume. There is Fat Layer (Subcutaneous Tissue) Exposed exposed. There is no tunneling or undermining noted. There is a medium amount of serosanguineous drainage noted. The wound margin is flat and intact. There is large (67-100%) **red granulation within the wound bed. There is a small (1-33%) amount of necrotic tissue within the wound bed including Adherent Slough. Wound #3 status is Open. Original cause of wound was Gradually Appeared. The wound is located on the Left,Medial Lower Leg. The wound measures 5.2cm length x 9cm width x 0.1cm depth; 36.757cm^2 area and 3.676cm^3 volume. There is Fat Layer (Subcutaneous Tissue) Exposed exposed. There is no tunneling or undermining noted. There is a large amount of purulent drainage noted. The wound margin is thickened. There is large (67-100%)  pink granulation within the wound bed. There is no necrotic tissue within the wound bed. Assessment Active Problems ICD-10 Chronic venous hypertension (idiopathic) with inflammation of left lower extremity Lymphedema, not elsewhere classified Non-pressure chronic ulcer of left calf limited to breakdown of skin Type 2 diabetes mellitus with other skin ulcer Other obesity due to excess calories Plan Follow-up Appointments: Other: - 2 months Dressing Change Frequency: Wound #1 Left,Posterior Knee: Change dressing every day. Wound Cleansing: May shower and wash wound with soap and water. - also cleanse with Vashe solution Primary Wound Dressing: Wound #1 Left,Posterior Knee: Calcium Alginate with Silver Wound #3 Left,Medial Lower Leg: Calcium Alginate with Silver Secondary Dressing: Wound #1 Left,Posterior Knee: Kerlix/Rolled Gauze ABD pad - or incontinence pads secure with transpore tape Edema Control: Avoid standing for long periods of time Elevate legs to the level of the heart or above for 30 minutes daily and/or when sitting, a frequency of: - throughout the day Exercise regularly Segmental Compressive Device. - lymphedema pumps 60  minutes 2 times per day! Off-Loading: Other: - place rolled towel or washcloth in skin fold behind left knee to separate skin in fold Additional Orders / Instructions: Follow Nutritious Diet - increase protein and vegetables to aid in wound healing. Home Health: [facility] for [facility]. - [place]. 3 times per week; patient is homebound. 1. We are going to continue with silver alginate as the primary dressing here. Is really most cost effective option as the patient pays for own wound supplies ordered on [place] rather than the co-pay through her Medicare replacement policy. 2. She is only using ABDs over the top of this and securing this with tape. 3. I do not think this is going to be enough to affect any  level of healing here and I think she is aware of this Electronic Signature(s) Signed: 05/30/2019 5:53:37 PM By: Linton Ham MD Entered By: Linton Ham on 05/30/2019 17:37:37 -------------------------------------------------------------------------------- SuperBill Details Patient Name: Date of Service: [name] 05/30/2019 Medical Record [id number] Patient Account Number: [id number] Date of Birth/Sex: Treating RN: 16-Feb-1966 (53 y.o. [name] Primary Care Provider: Fanny Bien Other Clinician: Referring Provider: Treating Provider/Extender:Amahri Dengel, Gennette Pac, Rojelio Brenner in Treatment: 138 Diagnosis Coding ICD-10 Codes Code Description I87.322 Chronic venous hypertension (idiopathic) with inflammation of left lower extremity I89.0 Lymphedema, not elsewhere classified L97.221 Non-pressure chronic ulcer of left calf limited to breakdown of skin E11.622 Type 2 diabetes mellitus with other skin ulcer E66.09 Other obesity due to excess calories Facility Procedures CPT4 Code: [id number] Description: 99213 - WOUND CARE VISIT-LEV 3 EST PT Modifier: Quantity: 1 Physician Procedures CPT4 Code Description: [id number] - WC PHYS LEVEL 2 - EST PT ICD-10 Diagnosis Description L97.221 Non-pressure chronic ulcer of left calf limited to bre I89.0 Lymphedema, not elsewhere classified I87.322 Chronic venous hypertension (idiopathic) with  inflamma extremity Modifier: akdown of skin tion of left lowe Quantity: 1 r Electronic Signature(s) Signed: 05/30/2019 5:53:37 PM By: Linton Ham MD Entered By: Linton Ham on 05/30/2019 17:38:11

## 2019-06-06 ENCOUNTER — Encounter (HOSPITAL_BASED_OUTPATIENT_CLINIC_OR_DEPARTMENT_OTHER): Payer: BLUE CROSS/BLUE SHIELD | Admitting: Internal Medicine

## 2019-06-23 IMAGING — CR DG KNEE COMPLETE 4+V*L*
4 series · 4 of 4 positions shown · non-contrast
Comparison: None.

CLINICAL DATA: Wound check.  No injury.

EXAM:
LEFT KNEE - COMPLETE 4+ VIEW

[x knee ap left]
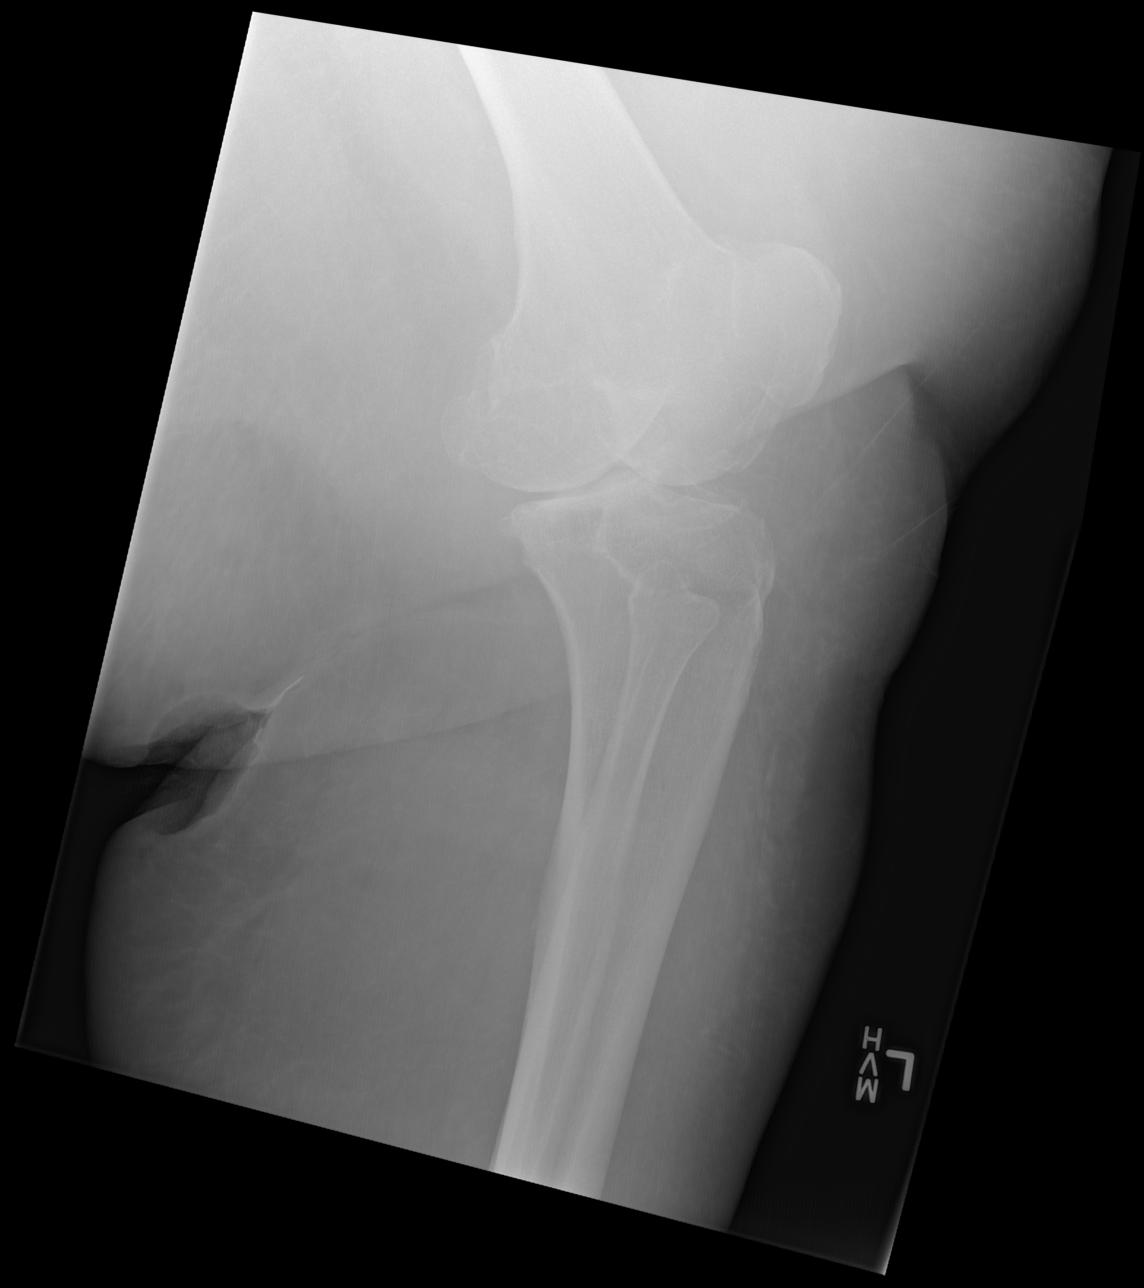

[x knee obl left (1 of 2)]
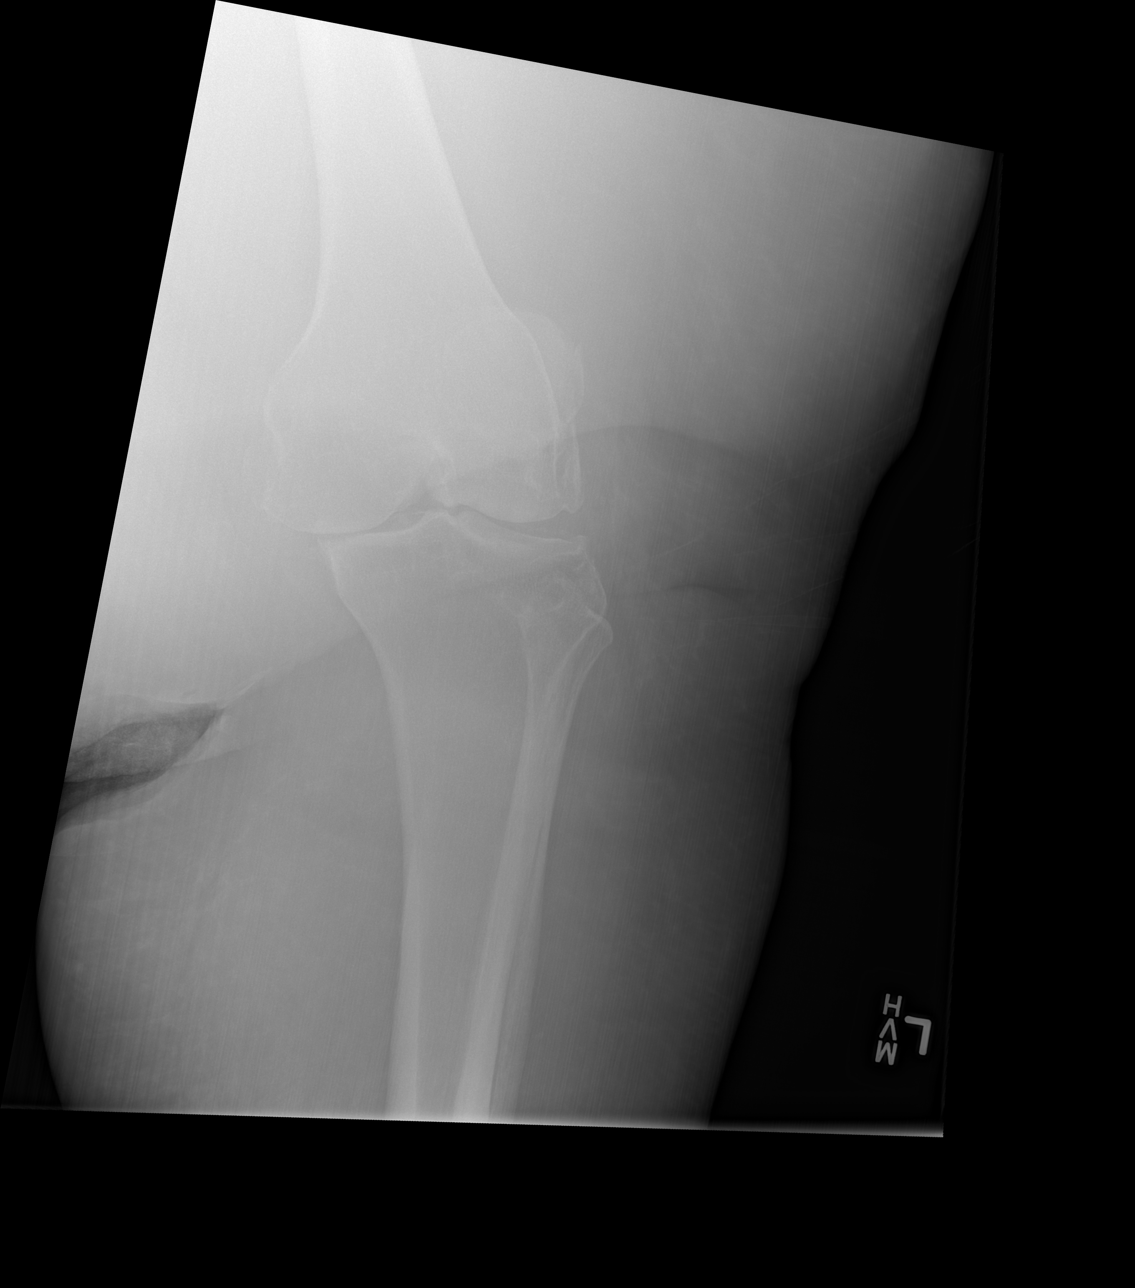

[x knee obl left (2 of 2)]
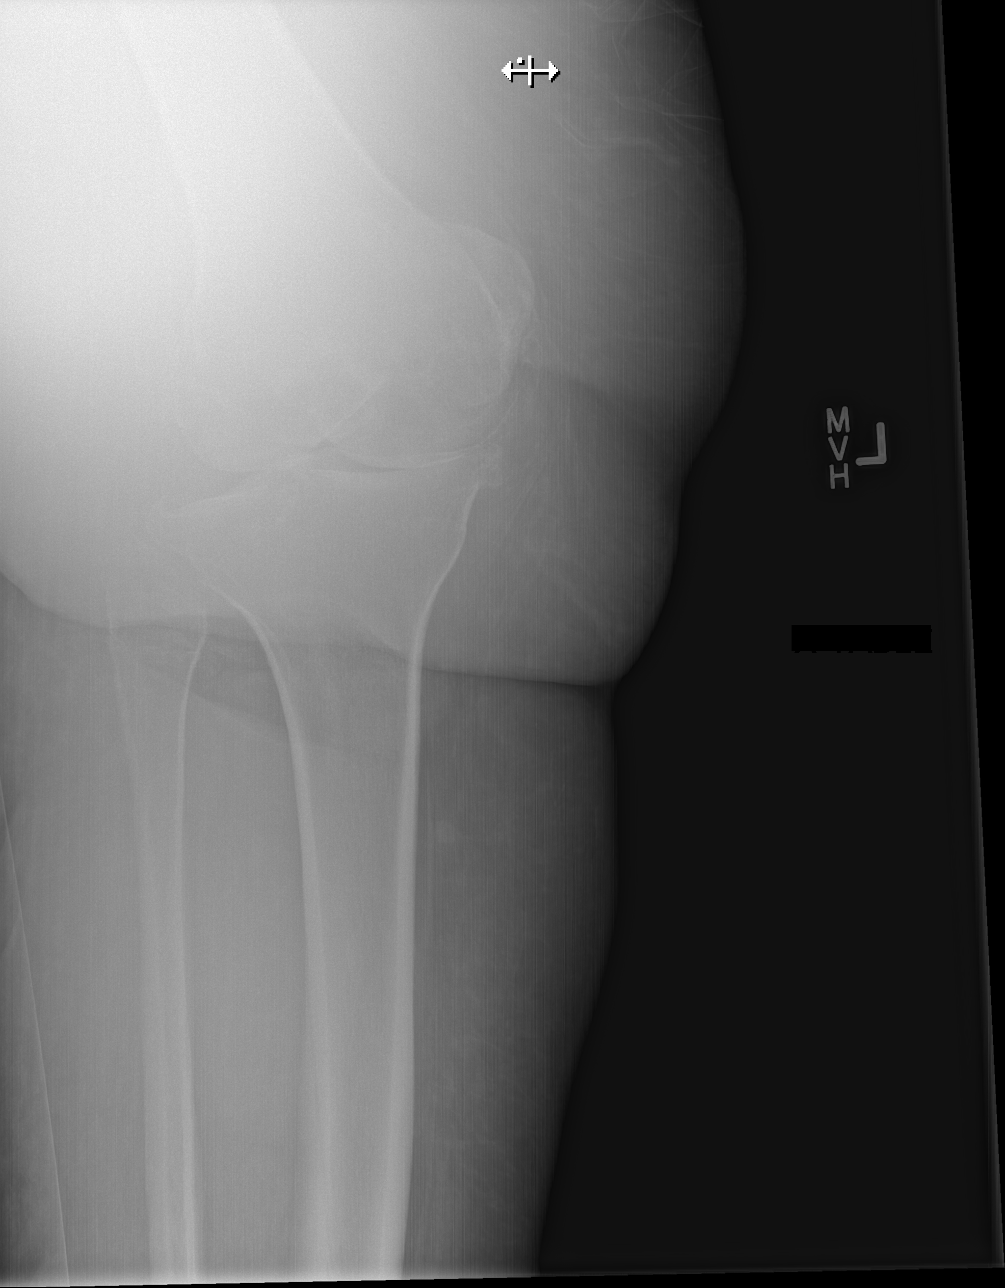

[x knee lat left]
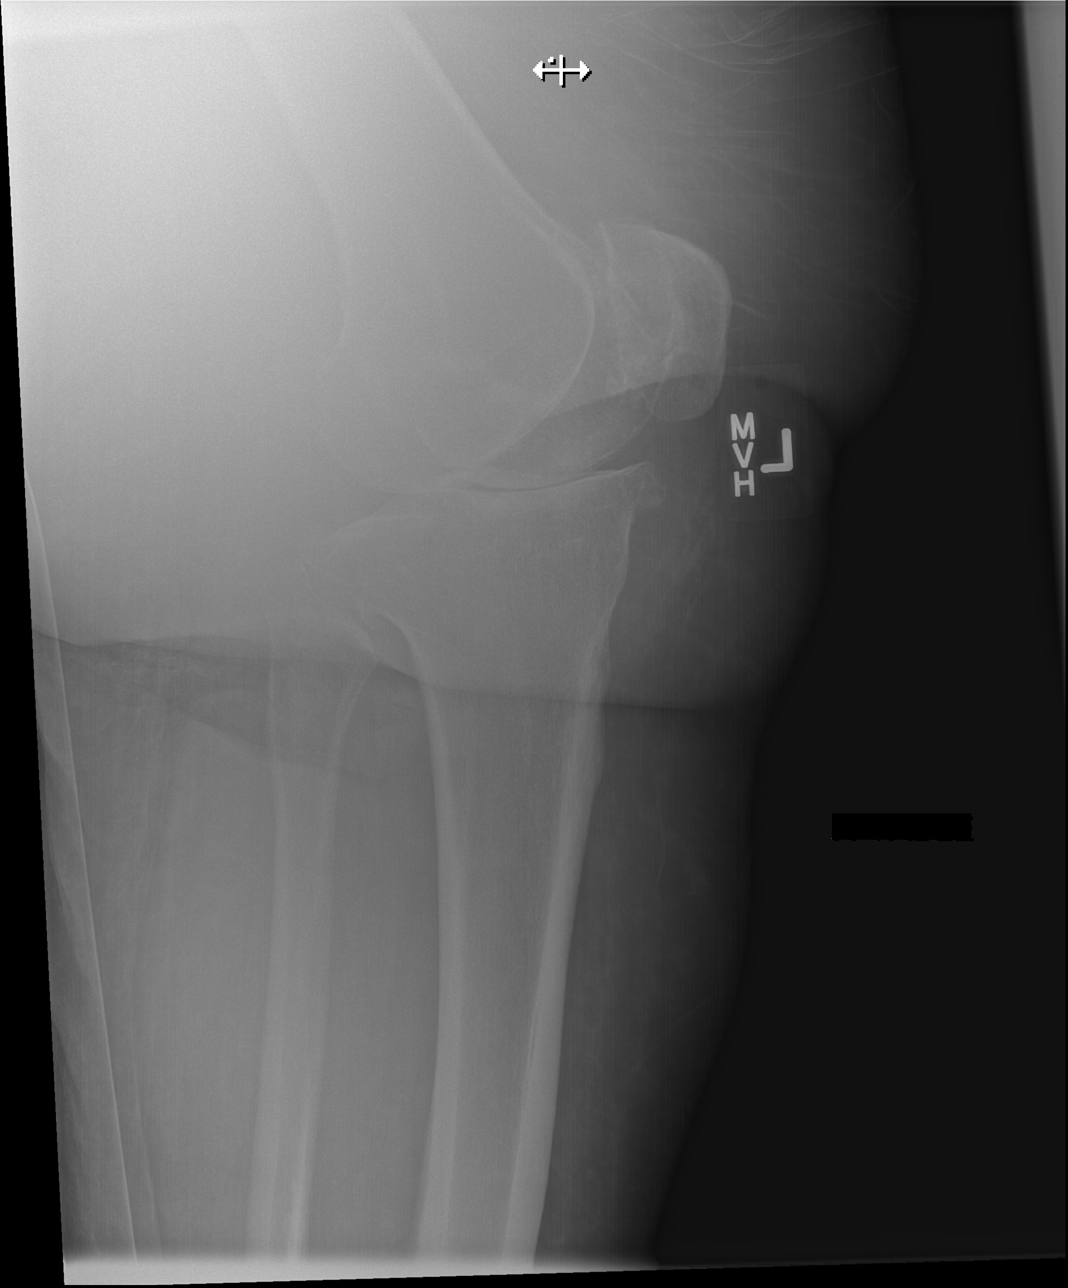

[4 of 4 positions shown; findings below may reference images not displayed]

FINDINGS: Examination demonstrates prominent overlying soft tissues. There is
diffuse osteopenia. There is mild to moderate tricompartmental
osteoarthritic change. Possible soft tissue ulceration over the
popliteal fossa.
IMPRESSION: Possible soft tissue ulceration over the popliteal fossa. Recommend
clinical correlation.

Mild-to-moderate tricompartmental osteoarthritic change.

## 2019-08-01 ENCOUNTER — Encounter (HOSPITAL_BASED_OUTPATIENT_CLINIC_OR_DEPARTMENT_OTHER): Payer: BLUE CROSS/BLUE SHIELD | Admitting: Internal Medicine

## 2019-08-08 ENCOUNTER — Encounter (HOSPITAL_BASED_OUTPATIENT_CLINIC_OR_DEPARTMENT_OTHER): Payer: BLUE CROSS/BLUE SHIELD | Admitting: Internal Medicine

## 2019-08-15 ENCOUNTER — Other Ambulatory Visit: Payer: Self-pay

## 2019-08-15 ENCOUNTER — Encounter (HOSPITAL_BASED_OUTPATIENT_CLINIC_OR_DEPARTMENT_OTHER): Payer: BLUE CROSS/BLUE SHIELD | Attending: Internal Medicine | Admitting: Internal Medicine

## 2019-08-15 DIAGNOSIS — I890 Lymphedema, not elsewhere classified: Secondary | ICD-10-CM | POA: Diagnosis not present

## 2019-08-15 DIAGNOSIS — S81802A Unspecified open wound, left lower leg, initial encounter: Secondary | ICD-10-CM | POA: Diagnosis not present

## 2019-08-15 DIAGNOSIS — L89899 Pressure ulcer of other site, unspecified stage: Secondary | ICD-10-CM | POA: Diagnosis not present

## 2019-08-15 NOTE — Progress Notes (Signed)
Sierra, Ware ([id number]) Visit Report for 08/15/2019 HPI Details Patient Name: Date of Service: [name], [name] 08/15/2019 2:15 PM Medical Record [id number] Patient Account Number: [id number] Date of Birth/Sex: Treating RN: 1966/06/02 (54 y.o. [name] Primary Care Provider: Fanny Bien Other Clinician: Referring Provider: Treating Provider/Extender:[name], Gennette Pac, Rojelio Brenner in Treatment: 149 History of Present Illness Location: Left popliteal fossa Duration: She was aware of the wound October 2017 Context: The development/occurrence is unclear Modifying Factors: Chronic pressure secondary to thigh pannus HPI Description: 10/01/16- She is here for initial evaluation of her left popliteal fossa ulcer. She states that she noticed the area in October 2017. She is unaware of actual causative factor, states she has a history of eczema and uses a brush for bathing. She was being treated by her PCP, using Aquaphor since then, and stopped using last week. She denies ever being on an antibiotic for this ulcer. She states that this drains excessively and soaks through her clothes, she is unable to visualize this area and has a friend that helps with the dressing changes. She was recently diagnosed with diabetes; A1c in December was 9; down to 7.5-8 in February. She is being managed on oral agents. She is a non-smoker. She has never been evaluated for venous reflux or lymphedema. 10/08/16- She is here in follow up regarding her left popliteal fossa pressure ulcer. She states that she did not receive any supplies as ordered. She states that the dressing material that was ordered and placed last week was in place for several days. She states that the odor and drainage is unchanged. She states that her PCP has written for 1 month off work, starting Monday 4/23. The culture obtained last week was mixed skin flora, no identifiable pathogen. 10/15/16;  difficult wound in the left popliteal fossa in the setting of morbid obesity and lymphedema. We have been using silver alginate although it is not really clear to me that this stays on for very long. She changes it once. Covering with AVD pads to try and separate wound from the surrounding pannus. A culture of this area showed both enterococcus and Enterobacter and I have her on a combination of amoxicillin and cipro. Although most Gram positives will be covered by Cipro, a big exception is Enterobacter particularly Enterobacter faecalis which generally requires ampicillin if indeed it is sensitive. A final issue is that the patient is limited by the co-pay on her insurance with regards to actual wound care supplies. I didn't get into this in much detail although he crossed my mind that we may need to bring her back more than once a week for dressing changes. 10/22/16; difficult wound in the left popliteal fossa. I think this is a lymphedema type issue in the middle lobe folds in the back of her knee. We have been using silver alginate, ABD pads. Dimensions today are improved, base of the wound is improved. I had some thoughts about biopsying this however overall this is improved today and all put that off for now 10/29/16; difficult wound in the left popliteal fossa which I think is a pressure wound from associated large folds of lymphedema. We have been using silver alginate. 11/05/16; difficult wound in the left popliteal fossa. Using silver alginate change 3x/wk. Wound measuring slightly smaller 11/12/16; difficult wound in the setting of chronic lymphedema. Left popliteal fossa. Using so over alginate change 3 times a week, once here and wants by home health and/or the patient's friend who  is a Marine scientist. The dimensions of this appear to be improving, the surface is certainly improved [id number]; Patient now has lymphedema pumps. wound is slightly reduced in width. Still using silver alginate 11/26/16;  patient has lymphedema pumps although she is having trouble with them in terms of fit. Nevertheless she tells me she is using them. She is still using silver alginate as the primary dressing 12/03/16; patient has lymphedema pumps and there is been significant improvement in the swelling in the leg. We've been using silver alginate but really only making minimal improvements if any in the overall wound area although the base of the wound is a lot better than when she first came here. 12/10/16; patient has increasing pain, drainage and odor this week. States this began to get worse over the weekend. She is been using her pumps at home and the edema is certainly better in the leg. We changed to [facility] Blue last week. She has home health coming out once a week to change the dressing 12/17/16; culture I did last week showed a few MSSA. We called her in cephalexin at 500 4 times a day. She admits to less than 100% compliance with the 4 times a day dosing. I have encouraged her today. The surrounding tenderness and erythema in this wound is a lot better. Nursing reports that the diameter of the wound is down a centimeter. Healthy-looking wound base no surrounding tenderness. We continue to use silver alginate 12/24/16; she is still completing her antibiotics apparently not taking them exactly as prescribed. The wound is perhaps marginally smaller. Surface of this continues to look not 100% viable. We have been using silver collagen. Consider changing to Iodoflex 12/31/16; she is finished her antibiotics. There is no evidence to suspect ongoing infection in the wound. We have looked back over the pictures of the wound in the left popliteal fossa. She came in with a thick adherent necrotic eschar and now we are dealing with slight surface slough. No debridement will be planned today. I'm going to try [facility] in lieu of the silver alginate starting today 01/07/17; patient continues to do well using  Hydrofera Blue. Reliably using her external compression pumps twice a day 01/15/17; the patient has been using [facility] with a general improvement in her wound condition and size. However she went back to work yesterday it would seem that her use of the compression pumps this week is been intermittent and at work she is unable to change the dressings. Per our intake nurse arrived with a macerated dressing in place. 01/22/17 on evaluation today patient appears to be doing a little bit worse. She is having more discomfort and there's a erythema surrounding the wound bed behind her left knee. She states this discomfort often is associated with infection and in fact this wound looks to likely be infected. She has no fevers, chills, nausea, vomiting, or diarrhea. 02/12/17; the patient's wound is not as good as I thought I would find it. She was making a good improvement when I saw her a month ago. She went back to work and largely she blames difficulties at work for this. Apparently during my absence she was treated with oral antibiotics starting on 8/3. I don't believe there are any cultures 02/19/17; patient arrives in clinic today with the wound is essentially unchanged in terms of dimensions and wound appearance. We have been using silver alginate. She claims compliance with the compression pumps twice a day 02/25/17; no major change in the  condition of the wound. We used Iodo flex last week with the purpose of trying to get a better-looking surface. She could not tolerate this because of discomfort. She found this especially bad when she used her compression pumps. Before this we use silver alginate again with not a lot of success. I changed her to [employer] which will certainly not have much debridement activity however I will probably change to weekly mechanical debridements. This will help with the drainage. She is now at home/on short-term disability. 03/11/17; the patient is now on  disability. She is been using PolyMem AG wound for the first time looks a little better this week and dimensions have improved. She is compliant with her external compression pumps 03/18/16; the patient is now on disability at home using her palms. We've been using PolyMen AG. Wound certainly looks smaller. Arrives today with an adherent surface requiring debridement. 04/08/17; patient has not been here in 3 weeks. Using Anasept with PolyMem AG. She arrives today with an adherent surface over the wound. She did not wish debridement, there was edema fluid dripping through the wound surface even though she states she is using her external compression pumps 04/15/17; patient is using Anasept with PolyMem and AG. She arrives today telling us that her leg is been hurting since a home care nurse staff her dressing into her on Sunday therefore she could not use her compression pumps in spite of this her dimensions appear to be better although this is a very difficult wound to measure in the popliteal fossa of the left knee 04/22/17; arrives today in clinic using Anasept and PolyMem AG. She is using her compression pumps per her description. The wound surface however once again is not going to allow healing. She still has weeping edema fluid coming through the wound. No major change in dimensions 04/29/17; arrives today with the major deterioration in the wound both in terms of dimensions and condition of the wound surface. She reports increasing pain since earlier in the week at our intake nurse reports purulent drainage on the ABD pads.this is not going in the right direction. We put Hydrofera Blue on this last week but I'm going to change back to [employer] because of the drainage. I reviewed previous cultures last done in June showed methicillin sensitive staph aureus. We had been making progress with this wound presenting initially with a necrotic wound on the popliteal fossa on the right. We  managed to get a viable surface and some reduction in wound dimensions however in the past 4 weeks this is been getting gradually worse and today much worse in terms of both size and condition of the wound surface. The patient is having pain and will require empiric antibiotics because of the purulent drainage and deterioration in the wound 05/20/17; I haven't seen this patient in 3 weeks. Biopsy of this wound I did last time suggested extensive granulation tissue with collections of neutrophils lymphocytes plasma cells histiocytes and multinucleated giant cells. There was no evidence of malignancy. Deep tissue culture I did showed MSSA and I gave her Keflex when she was here last time and renewed this for a week however she is still taking the medication I'm not certain she actually took it properly.the pathologist suggested the possibility of pyoderma gangrenosum, a vasculopathy. PAS stains and acid-fast stains were negative She also tells me that she is having mobility problems secondary to pain in both knees. She uses a walker but can barely make it to  the bathroom. It sounds as though she has a history of chronic osteoarthritis and she tells me that she had a left anterior cruciate ligament tear at some time in the past 06/10/17; this is a patient that I haven't seen in 3 weeks. She is completed her antibiotics. Biopsy suggested the possibility of infection versus pyoderma gangrenosum. I gave her 2 weeks of Keflex directed at [facility] she is repeated this.she called last week to request more antibiotics but I wanted to see her. She arrives today with her aunt. Apparently the patient has deteriorated in terms of function. According the patient and her aunt she can no longer ambulate or easily transfer. She is incontinent because she has urge incontinence etc. They requested admission to a rehabilitation facility or the hospital and then a rehabilitation facility. I was not aware that the  patient was declining in terms a shin but according to our staff that's been the case they haven't seen her walk in the him any months that she's been here. I wasn't really aware of this. In fact the patient was working up until the end of August. In any case she arrives with the wound a lot worse 07/01/17; this is a patient I haven't seen in 3 weeks. The last time she was here at the request of her family member we sent her to the ER. She apparently was kept overnight. She was treated for UTI. Attempts were made to find her long-term care although there was insurance and cost issues and she is back home. She now has her aunt living with her. I am not clear what they are using to the wound on the posterior left knee/popliteal fossa. Last biopsy I did of this area did not document pyoderma which had been raised by another/previous biopsy. We are not clear today what they're actually putting on the wound 07/15/17; she arrives today with improvement in her wound dimensions although there is still weeping edema fluid. She claims she is changing the dressing [silver alginate] twice daily. I've suggested increasing this to 3 times a day. She is changing the silver alginate twice a day 07/29/17; patient complains of pain drainage and odor coming from the wound which was different from 2 weeks ago.. We have been using silver alginate she has been compliant with her compression pumps 09/02/17; the patient continues to complain of pain and drainage. I gave her empiric antibiotics when she was last here 5 weeks ago but she says she only completed them recently. She seems confused about the time frame of her last visit here.she states she was concerned about an odor and stopped using the [facility] Blue on the wound in her left popliteal fossa about a week ago. I don't think she is dressing this with anything since. She is using her lymphedema pumps once a day.previous biopsy of done of this area showed  inflammation associated with stasis dermatitis or trauma She is still not walking apparently because of severe osteoarthritis in both knees. She thinks she'll be going back to work in June and she needs to be ambulatory although I really can't see this happening. 09/16/17; the patient is been using a mixture of Hydrofera Blue and PolyMen that she had left over. I'm not really sure how reliable this is. She tells me she is using her external compression pumps once a day. She had a steroid shot in her left knee this morning 09/30/17; 2 week follow-up for this patient with a difficult wound behind her left  popliteal fossa. She has home health. They're using Hydrofera Blue. Measurement slightly smaller. She states she is using her compression pumps once a day 10/14/17; 2 week follow-up for this woman with a difficult wound with find her left popliteal fossa. She has home health. Using Hydrofera Blue. Measurements again today slightly smaller. Most of the surface of this covered in a nonviable tightly green adherent material. Some of this may be retained Hydrofera Blue nevertheless was debrided with great difficulty. She asked me about her upcoming disability which I think happens in June. I told her that from my point of view the wound itself does not qualify her to be disabled by itself. Now I'm aware that she can no longer stand and walk. This really deteriorated quite a bit during the course of the time she is been here. I suspect this is due to severe osteoarthritis of both knees and she is seen orthopedics for this. I think this is a source of her disability but I don't really feel comfortable with this given the fact that I don't precisely know the exact diagnosis here. 10/28/17; 2 week follow-up for this woman with a difficult wound in her left popliteal fossa. This is complicated by lymphedema. She is episodic in using her compression pumps we obtained. We've been using Hydrofera Blue. Dimensions  of the wound are not any different however surface of this looks a lot better than last time. Prior to last visit at which time I had the debridement the wound, dimensions were slightly smaller. The patient came in last time talking to me about disability. I sent her to see her primary physician who I thought would do a proper evaluation for this patient with regards to her disability thoughts. The patient is able to stand and transfer but is no longer ambulatory. She has a wheelchair at home.Marland Kitchen She works as a Freight forwarder for [employer]. She can no longer drive. 11/08/17; this is a 2 week follow-up for patient with a difficult wound in the left popliteal fossa complicated by lymphedema. I am not sure about her compliance with the compression pumps we've been using Hydrofera Blue for 2 months now and we have been making decent progress however once again today she arrives with 50% of the overall wound area covered in tightly adherent black/green necrotic tissue. 12/02/17; difficult wound in the left popliteal fossa complicated by severe lymphedema. We've been using silver alginate although the patient ran out of this and she's been using Hydrofera Blue. Last time she is here she had a considerable amount of tightly adherent green necrotic tissue. Post debridement culture showed MSSA, I gave her 7 days worth of Septra DS which seems to have helped 12/24/17; almost palliative follow-up for this difficult area on the left popliteal fossa complicated by severe lymphedema. We've been using silver alginate although the patient tells me she's been using Hydrofera Blue. She does not separate the folds of this wound. She has been using her lymphedema pumps but not every day. 01/20/18 This is a difficult wound in the left popliteal fossa. Intake nurse noted some purulent drainage and necrotic material. She has severe lymphedema including the posterior thigh and the upper calf surrounding this wound and there is continued  leaking edema fluid out of the wound surface. This is not going to heal like this. She states she is being compliant with the lymphedema pumps once sometimes twice a day. However this is not controlling the edema around the wound either in the calf or  the posterior thigh 02/10/18; really not making any progress here. The wound is actually larger. There is no purulent drainage. Culture I did last time was negative/multiple organisms 03/10/18; 1 month follow-up. The wound may be measures slightly smaller. We've been using care max directly on the wound surface. The area definitely looks less moist. She tells me she is also you been using her pumps religiously twice a day. In fact the area actually looks dry today. 04/12/2018; 1 month follow-up. She states after I used silver nitrate on this wound area a month ago that the pain was intolerable and continued all month. This made it too painful for her to use the care of max reliably or to use her compression pumps. She arrives in clinic today with her wound probably twice the size. There is no purulent drainage 05/03/2018; the patient has been using her pumps reliably twice a day over the last week but she finds the combination of the pump and a dressing on the wound to be unduly painful therefore there is not been a dressing in the last week. She arrives in clinic today with the wound continuing to enlarge. She did not tolerate silver nitrate nor Kerramax directly on the wound 05/26/2018; the patient has not been using her pumps. Her wound in the left posterior popliteal fossa continues to expand. We are using silver alginate although the options for primary dressings are limited since she is paying out- of-pocket for this. Culture I did last time which was a PCR culture showed a scattered number of anaerobes low quantities. I did not think this was significant. 06/09/2018 the patient has not been using her pumps. She puts care of max in the area on  the posterior popliteal fossa. She has no one to help her clean this area. Home health is apparently coming by once a week. For some reason she has to purchase a lot of her supplies herself online. She does not have a lot of financial resources. She states the pumps hurt her and caused the wound to bleed. She is continuing to deteriorate Biopsy I did last visit did not show malignancy/fungus or atypical Mycobacterium. This is a second time I have biopsied this 06/30/2018; the patient states she is using her pumps at least once a day. She put silver alginate and a rolled towel in the area. She occasionally uses kerramax and/or ABD pads. She is decided that a application of silver nitrate I did because of very friable mucosa on this sometime in September is the reason that she could not use the pumps. I did not actually research this. This is clearly not the case 1/30; the patient states she is using her pumps once a day but sometimes for as long as 2 hours. I told her that she still needs to do this and additional time a day. She is using silver alginate. She states the [facility] hurts when she uses her pumps therefore she is not using this. She has ABD pads that she is getting from home health. Dimensions perhaps slightly improved 3/5; the patient uses her pumps once a day but she states for is sometimes as long as 2 hours. She has not managed to do this twice a day. She is using silver alginate. Separating these areas with ABDs and rolled towels. There is no change in dimensions of this wound if anything slightly larger although the surfaces look better 4/2; I do not see too much difference 1 way or the other in  the area on the left popliteal fossa. She is not using her pumps. She did describe an episode of pain and odor a week or 2 ago however she said it is mostly subsided. Still using silver alginate. 5/8; monthly follow-up for this large area in the left popliteal fossa. She is not using her  compression pumps there is surrounding lymphedema from tissue in the posterior thigh in the posterior calf. Her home health nurse had called 2 weeks ago to report black drainage on the surface, she used silver alginate after that and it seems to have cleared up. 6/4; Patient is not using pumps. Finds silver alginate painful. I think she is just using ABD's in the area. She has a new wound laterally to the original wound. Leaking copious lymphedema 7/9; monthly follow-up. Patient is using her pumps about once a week finds it painful. She uses some combination of silver calcium alginate. She has to buy her own supplies [place] and she uses an ABD. She has home health but her co-pay is such that it is cheaper for her to buy her supplies online 8/6-Patient is here for her monthly follow-up, she has started using her pumps again, apparently a couple of weeks ago she had a lot of pain and could not use the pumps, she is now using silver alginate, she states her left posterior calf wound had some drainage that was greenish now converted to serous 10/13; 20-month follow-up. The patient's wound is on the left popliteal fossa. Fairly substantial area in the middle of 2 pannus folds the lower part of her thigh and the upper part of her calf. She has been using silver alginate. As usual she presents with greenish drainage but the wound does not look infected 12/8; 34-month follow-up. The patient now has 2 wounds in the popliteal fossa on the left. Fairly substantial area in the middle of 2 pannus folds in the lower part of her thigh and the upper part of her calf. She has been using silver alginate. There is a smaller wound laterally and posteriorly very difficult to see. The patient says she is using her compression pumps 3 times a week. She is not willing to separate the folds in this wound area. I am not sure I really understand the issue. 2/23; 23-month follow-up. I did not see a second wound this time.  But otherwise I think the area on her left popliteal fossa is unchanged. The wound is between 2 fairly substantial pannus fold in the lower part of her thigh and the upper part of her calf. She has been using silver alginate which she buys online herself. She asks me about different dressings suggested by home health. She is not using her compression pumps and she does not keep these pannus fold separated. Electronic Signature(s) Signed: 08/15/2019 6:00:34 PM By: Linton Ham MD Entered By: Linton Ham on 08/15/2019 16:02:16 -------------------------------------------------------------------------------- Physical Exam Details Patient Name: Date of Service: Petrelli, Maxyne T. 08/15/2019 2:15 PM Medical Record [id number] Patient Account Number: [id number] Date of Birth/Sex: Treating RN: [birth year] (54 y.o. [name] Primary Care Provider: Fanny Bien Other Clinician: Referring Provider: Treating Provider/Extender:Adia Crammer, Gennette Pac, Rojelio Brenner in Treatment: 149 Cardiovascular Pedal pulses are palpable. Lymphedema. Integumentary (Hair, Skin) Skin is thick dry fissured. Notes Wound exam; substantial area in the left popliteal fossa. I do not think this has changed at all since I last saw this 2-1/2 months ago. I did not see any satellite wound wounds. No evidence  of infection. Electronic Signature(s) Signed: 08/15/2019 6:00:34 PM By: Linton Ham MD Entered By: Linton Ham on 08/15/2019 16:03:57 -------------------------------------------------------------------------------- Physician Orders Details Patient Name: Date of Service: Danna Hefty 08/15/2019 2:15 PM Medical Record [id number] Patient Account Number: [id number] Date of Birth/Sex: Treating RN: 04/19/1966 (54 y.o. [name] Primary Care Provider: Fanny Bien Other Clinician: Referring Provider: Treating Provider/Extender:Monya Kozakiewicz, Gennette Pac, Rojelio Brenner in Treatment: [phone] Verbal / Phone Orders: No Diagnosis Coding ICD-10 Coding Code Description I87.322 Chronic venous hypertension (idiopathic) with inflammation of left lower extremity I89.0 Lymphedema, not elsewhere classified L97.221 Non-pressure chronic ulcer of left calf limited to breakdown of skin E11.622 Type 2 diabetes mellitus with other skin ulcer E66.09 Other obesity due to excess calories Follow-up Appointments Other: - 2 months Dressing Change Frequency Wound #1 Left,Posterior Knee Change dressing every day. Wound Cleansing May shower and wash wound with soap and water. - also cleanse with Vashe solution Primary Wound Dressing Wound #1 Left,Posterior Knee Calcium Alginate with Silver Wound #3 Left,Medial Lower Leg Calcium Alginate with Silver Secondary Dressing Wound #1 Left,Posterior Knee Kerlix/Rolled Gauze ABD pad - or incontinence pads secure with transpore tape Edema Control Avoid standing for long periods of time Elevate legs to the level of the heart or above for 30 minutes daily and/or when sitting, a frequency of: - throughout the day Exercise regularly Segmental Compressive Device. - lymphedema pumps 60 minutes 2 times per day! Off-Loading Other: - place rolled towel or washcloth in skin fold behind left knee to separate skin in fold Additional Orders / Instructions Follow Nutritious Diet - increase protein and vegetables to aid in wound healing. [facility] for wound care. - [place]. 3 times per week; patient is homebound. Electronic Signature(s) Signed: 08/15/2019 6:00:34 PM By: Linton Ham MD Signed: 08/15/2019 6:06:02 PM By: Carlene Coria RN Entered By: Carlene Coria on 08/15/2019 15:56:43 -------------------------------------------------------------------------------- Problem List Details Patient Name: Date of Service: Danna Hefty 08/15/2019 2:15 PM Medical Record [id number]  Patient Account Number: [id number] Date of Birth/Sex: Treating RN: [birth year] (54 y.o. [name] Primary Care Provider: Fanny Bien Other Clinician: Referring Provider: Treating Provider/Extender:Arnelle Nale, Gennette Pac, Rojelio Brenner in Treatment: [phone] Active Problems ICD-10 Evaluated Encounter Code Description Active Date Today Diagnosis I87.322 Chronic venous hypertension (idiopathic) with 11/05/2016 No Yes inflammation of left lower extremity I89.0 Lymphedema, not elsewhere classified 10/09/2016 No Yes L97.221 Non-pressure chronic ulcer of left calf limited to 11/05/2016 No Yes breakdown of skin E11.622 Type 2 diabetes mellitus with other skin ulcer 10/01/2016 No Yes E66.09 Other obesity due to excess calories 10/01/2016 No Yes Inactive Problems Resolved Problems Electronic Signature(s) Signed: 08/15/2019 6:00:34 PM By: Linton Ham MD Entered By: Linton Ham on 08/15/2019 16:00:40 -------------------------------------------------------------------------------- Progress Note Details Patient Name: Date of Service: Danna Hefty 08/15/2019 2:15 PM Medical Record [id number] Patient Account Number: [id number] Date of Birth/Sex: Treating RN: [birth year] (54 y.o. [name] Primary Care Provider: Fanny Bien Other Clinician: Referring Provider: Treating Provider/Extender:Sergi Gellner, Gennette Pac, Rojelio Brenner in Treatment: 149 Subjective History of Present Illness (HPI) The following HPI elements were documented for the patient's wound: Location: Left popliteal fossa Duration: She was aware of the wound October 2017 Context: The development/occurrence is unclear Modifying Factors: Chronic pressure secondary to thigh pannus 10/01/16- She is here for initial evaluation of her left popliteal fossa ulcer. She states that she noticed the area in October 2017. She is unaware of actual causative factor, states  she has a history of eczema and  uses a brush for bathing. She was being treated by her PCP, using Aquaphor since then, and stopped using last week. She denies ever being on an antibiotic for this ulcer. She states that this drains excessively and soaks through her clothes, she is unable to visualize this area and has a friend that helps with the dressing changes. She was recently diagnosed with diabetes; A1c in December was 9; down to 7.5-8 in February. She is being managed on oral agents. She is a non-smoker. She has never been evaluated for venous reflux or lymphedema. 10/08/16- She is here in follow up regarding her left popliteal fossa pressure ulcer. She states that she did not receive any supplies as ordered. She states that the dressing material that was ordered and placed last week was in place for several days. She states that the odor and drainage is unchanged. She states that her PCP has written for 1 month off work, starting Monday 4/23. The culture obtained last week was mixed skin flora, no identifiable pathogen. 10/15/16; difficult wound in the left popliteal fossa in the setting of morbid obesity and lymphedema. We have been using silver alginate although it is not really clear to me that this stays on for very long. She changes it once. Covering with AVD pads to try and separate wound from the surrounding pannus. A culture of this area showed both enterococcus and Enterobacter and I have her on a combination of amoxicillin and cipro. Although most Gram positives will be covered by Cipro, a big exception is Enterobacter particularly Enterobacter faecalis which generally requires ampicillin if indeed it is sensitive. A final issue is that the patient is limited by the co-pay on her insurance with regards to actual wound care supplies. I didn't get into this in much detail although he crossed my mind that we may need to bring her back more than once a week for dressing changes. 10/22/16; difficult wound in the left  popliteal fossa. I think this is a lymphedema type issue in the middle lobe folds in the back of her knee. We have been using silver alginate, ABD pads. Dimensions today are improved, base of the wound is improved. I had some thoughts about biopsying this however overall this is improved today and all put that off for now 10/29/16; difficult wound in the left popliteal fossa which I think is a pressure wound from associated large folds of lymphedema. We have been using silver alginate. 11/05/16; difficult wound in the left popliteal fossa. Using silver alginate change 3x/wk. Wound measuring slightly smaller 11/12/16; difficult wound in the setting of chronic lymphedema. Left popliteal fossa. Using so over alginate change 3 times a week, once here and wants by home health and/or the patient's friend who is a Marine scientist. The dimensions of this appear to be improving, the surface is certainly improved [id number]; Patient now has lymphedema pumps. wound is slightly reduced in width. Still using silver alginate 11/26/16; patient has lymphedema pumps although she is having trouble with them in terms of fit. Nevertheless she tells me she is using them. She is still using silver alginate as the primary dressing 12/03/16; patient has lymphedema pumps and there is been significant improvement in the swelling in the leg. We've been using silver alginate but really only making minimal improvements if any in the overall wound area although the base of the wound is a lot better than when she first came here. 12/10/16; patient has increasing  pain, drainage and odor this week. States this began to get worse over the weekend. She is been using her pumps at home and the edema is certainly better in the leg. We changed to [facility] Blue last week. She has home health coming out once a week to change the dressing 12/17/16; culture I did last week showed a few MSSA. We called her in cephalexin at 500 4 times a day. She admits to  less than 100% compliance with the 4 times a day dosing. I have encouraged her today. The surrounding tenderness and erythema in this wound is a lot better. Nursing reports that the diameter of the wound is down a centimeter. Healthy-looking wound base no surrounding tenderness. We continue to use silver alginate 12/24/16; she is still completing her antibiotics apparently not taking them exactly as prescribed. The wound is perhaps marginally smaller. Surface of this continues to look not 100% viable. We have been using silver collagen. Consider changing to Iodoflex 12/31/16; she is finished her antibiotics. There is no evidence to suspect ongoing infection in the wound. We have looked back over the pictures of the wound in the left popliteal fossa. She came in with a thick adherent necrotic eschar and now we are dealing with slight surface slough. No debridement will be planned today. I'm going to try [facility] in lieu of the silver alginate starting today 01/07/17; patient continues to do well using Hydrofera Blue. Reliably using her external compression pumps twice a day 01/15/17; the patient has been using [facility] with a general improvement in her wound condition and size. However she went back to work yesterday it would seem that her use of the compression pumps this week is been intermittent and at work she is unable to change the dressings. Per our intake nurse arrived with a macerated dressing in place. 01/22/17 on evaluation today patient appears to be doing a little bit worse. She is having more discomfort and there's a erythema surrounding the wound bed behind her left knee. She states this discomfort often is associated with infection and in fact this wound looks to likely be infected. She has no fevers, chills, nausea, vomiting, or diarrhea. 02/12/17; the patient's wound is not as good as I thought I would find it. She was making a good improvement when I saw her a month ago. She  went back to work and largely she blames difficulties at work for this. Apparently during my absence she was treated with oral antibiotics starting on 8/3. I don't believe there are any cultures 02/19/17; patient arrives in clinic today with the wound is essentially unchanged in terms of dimensions and wound appearance. We have been using silver alginate. She claims compliance with the compression pumps twice a day 02/25/17; no major change in the condition of the wound. We used Iodo flex last week with the purpose of trying to get a better-looking surface. She could not tolerate this because of discomfort. She found this especially bad when she used her compression pumps. Before this we use silver alginate again with not a lot of success. I changed her to [employer] which will certainly not have much debridement activity however I will probably change to weekly mechanical debridements. This will help with the drainage. She is now at home/on short-term disability. 03/11/17; the patient is now on disability. She is been using PolyMem AG wound for the first time looks a little better this week and dimensions have improved. She is compliant with her  external compression pumps 03/18/16; the patient is now on disability at home using her palms. We've been using PolyMen AG. Wound certainly looks smaller. Arrives today with an adherent surface requiring debridement. 04/08/17; patient has not been here in 3 weeks. Using Anasept with PolyMem AG. She arrives today with an adherent surface over the wound. She did not wish debridement, there was edema fluid dripping through the wound surface even though she states she is using her external compression pumps 04/15/17; patient is using Anasept with PolyMem and AG. She arrives today telling us that her leg is been hurting since a home care nurse staff her dressing into her on Sunday therefore she could not use her compression pumps in spite of this her dimensions  appear to be better although this is a very difficult wound to measure in the popliteal fossa of the left knee 04/22/17; arrives today in clinic using Anasept and PolyMem AG. She is using her compression pumps per her description. The wound surface however once again is not going to allow healing. She still has weeping edema fluid coming through the wound. No major change in dimensions 04/29/17; arrives today with the major deterioration in the wound both in terms of dimensions and condition of the wound surface. She reports increasing pain since earlier in the week at our intake nurse reports purulent drainage on the ABD pads.this is not going in the right direction. We put Hydrofera Blue on this last week but I'm going to change back to [employer] because of the drainage. I reviewed previous cultures last done in June showed methicillin sensitive staph aureus. We had been making progress with this wound presenting initially with a necrotic wound on the popliteal fossa on the right. We managed to get a viable surface and some reduction in wound dimensions however in the past 4 weeks this is been getting gradually worse and today much worse in terms of both size and condition of the wound surface. The patient is having pain and will require empiric antibiotics because of the purulent drainage and deterioration in the wound 05/20/17; I haven't seen this patient in 3 weeks. Biopsy of this wound I did last time suggested extensive granulation tissue with collections of neutrophils lymphocytes plasma cells histiocytes and multinucleated giant cells. There was no evidence of malignancy. Deep tissue culture I did showed MSSA and I gave her Keflex when she was here last time and renewed this for a week however she is still taking the medication I'm not certain she actually took it properly.the pathologist suggested the possibility of pyoderma gangrenosum, a vasculopathy. PAS stains and acid-fast stains  were negative She also tells me that she is having mobility problems secondary to pain in both knees. She uses a walker but can barely make it to the bathroom. It sounds as though she has a history of chronic osteoarthritis and she tells me that she had a left anterior cruciate ligament tear at some time in the past 06/10/17; this is a patient that I haven't seen in 3 weeks. She is completed her antibiotics. Biopsy suggested the possibility of infection versus pyoderma gangrenosum. I gave her 2 weeks of Keflex directed at [facility] she is repeated this.she called last week to request more antibiotics but I wanted to see her. She arrives today with her aunt. Apparently the patient has deteriorated in terms of function. According the patient and her aunt she can no longer ambulate or easily transfer. She is incontinent because she has urge  incontinence etc. They requested admission to a rehabilitation facility or the hospital and then a rehabilitation facility. I was not aware that the patient was declining in terms a shin but according to our staff that's been the case they haven't seen her walk in the him any months that she's been here. I wasn't really aware of this. In fact the patient was working up until the end of August. In any case she arrives with the wound a lot worse 07/01/17; this is a patient I haven't seen in 3 weeks. The last time she was here at the request of her family member we sent her to the ER. She apparently was kept overnight. She was treated for UTI. Attempts were made to find her long-term care although there was insurance and cost issues and she is back home. She now has her aunt living with her. I am not clear what they are using to the wound on the posterior left knee/popliteal fossa. Last biopsy I did of this area did not document pyoderma which had been raised by another/previous biopsy. We are not clear today what they're actually putting on the wound 07/15/17; she arrives  today with improvement in her wound dimensions although there is still weeping edema fluid. She claims she is changing the dressing [silver alginate] twice daily. I've suggested increasing this to 3 times a day. She is changing the silver alginate twice a day 07/29/17; patient complains of pain drainage and odor coming from the wound which was different from 2 weeks ago.. We have been using silver alginate she has been compliant with her compression pumps 09/02/17; the patient continues to complain of pain and drainage. I gave her empiric antibiotics when she was last here 5 weeks ago but she says she only completed them recently. She seems confused about the time frame of her last visit here.she states she was concerned about an odor and stopped using the [facility] Blue on the wound in her left popliteal fossa about a week ago. I don't think she is dressing this with anything since. She is using her lymphedema pumps once a day.previous biopsy of done of this area showed inflammation associated with stasis dermatitis or trauma She is still not walking apparently because of severe osteoarthritis in both knees. She thinks she'll be going back to work in June and she needs to be ambulatory although I really can't see this happening. 09/16/17; the patient is been using a mixture of Hydrofera Blue and PolyMen that she had left over. I'm not really sure how reliable this is. She tells me she is using her external compression pumps once a day. She had a steroid shot in her left knee this morning 09/30/17; 2 week follow-up for this patient with a difficult wound behind her left popliteal fossa. She has home health. They're using Hydrofera Blue. Measurement slightly smaller. She states she is using her compression pumps once a day 10/14/17; 2 week follow-up for this woman with a difficult wound with find her left popliteal fossa. She has home health. Using Hydrofera Blue. Measurements again today slightly  smaller. Most of the surface of this covered in a nonviable tightly green adherent material. Some of this may be retained Hydrofera Blue nevertheless was debrided with great difficulty. She asked me about her upcoming disability which I think happens in June. I told her that from my point of view the wound itself does not qualify her to be disabled by itself. Now I'm aware that she can  no longer stand and walk. This really deteriorated quite a bit during the course of the time she is been here. I suspect this is due to severe osteoarthritis of both knees and she is seen orthopedics for this. I think this is a source of her disability but I don't really feel comfortable with this given the fact that I don't precisely know the exact diagnosis here. 10/28/17; 2 week follow-up for this woman with a difficult wound in her left popliteal fossa. This is complicated by lymphedema. She is episodic in using her compression pumps we obtained. We've been using Hydrofera Blue. Dimensions of the wound are not any different however surface of this looks a lot better than last time. Prior to last visit at which time I had the debridement the wound, dimensions were slightly smaller. The patient came in last time talking to me about disability. I sent her to see her primary physician who I thought would do a proper evaluation for this patient with regards to her disability thoughts. The patient is able to stand and transfer but is no longer ambulatory. She has a wheelchair at home.Marland Kitchen She works as a Freight forwarder for [employer]. She can no longer drive. 11/08/17; this is a 2 week follow-up for patient with a difficult wound in the left popliteal fossa complicated by lymphedema. I am not sure about her compliance with the compression pumps we've been using Hydrofera Blue for 2 months now and we have been making decent progress however once again today she arrives with 50% of the overall wound area covered in tightly adherent  black/green necrotic tissue. 12/02/17; difficult wound in the left popliteal fossa complicated by severe lymphedema. We've been using silver alginate although the patient ran out of this and she's been using Hydrofera Blue. Last time she is here she had a considerable amount of tightly adherent green necrotic tissue. Post debridement culture showed MSSA, I gave her 7 days worth of Septra DS which seems to have helped 12/24/17; almost palliative follow-up for this difficult area on the left popliteal fossa complicated by severe lymphedema. We've been using silver alginate although the patient tells me she's been using Hydrofera Blue. She does not separate the folds of this wound. She has been using her lymphedema pumps but not every day. 01/20/18 This is a difficult wound in the left popliteal fossa. Intake nurse noted some purulent drainage and necrotic material. She has severe lymphedema including the posterior thigh and the upper calf surrounding this wound and there is continued leaking edema fluid out of the wound surface. This is not going to heal like this. She states she is being compliant with the lymphedema pumps once sometimes twice a day. However this is not controlling the edema around the wound either in the calf or the posterior thigh 02/10/18; really not making any progress here. The wound is actually larger. There is no purulent drainage. Culture I did last time was negative/multiple organisms 03/10/18; 1 month follow-up. The wound may be measures slightly smaller. We've been using care max directly on the wound surface. The area definitely looks less moist. She tells me she is also you been using her pumps religiously twice a day. In fact the area actually looks dry today. 04/12/2018; 1 month follow-up. She states after I used silver nitrate on this wound area a month ago that the pain was intolerable and continued all month. This made it too painful for her to use the care of max  reliably or to  use her compression pumps. She arrives in clinic today with her wound probably twice the size. There is no purulent drainage 05/03/2018; the patient has been using her pumps reliably twice a day over the last week but she finds the combination of the pump and a dressing on the wound to be unduly painful therefore there is not been a dressing in the last week. She arrives in clinic today with the wound continuing to enlarge. She did not tolerate silver nitrate nor Kerramax directly on the wound 05/26/2018; the patient has not been using her pumps. Her wound in the left posterior popliteal fossa continues to expand. We are using silver alginate although the options for primary dressings are limited since she is paying out- of-pocket for this. Culture I did last time which was a PCR culture showed a scattered number of anaerobes low quantities. I did not think this was significant. 06/09/2018 the patient has not been using her pumps. She puts care of max in the area on the posterior popliteal fossa. She has no one to help her clean this area. Home health is apparently coming by once a week. For some reason she has to purchase a lot of her supplies herself online. She does not have a lot of financial resources. She states the pumps hurt her and caused the wound to bleed. She is continuing to deteriorate Biopsy I did last visit did not show malignancy/fungus or atypical Mycobacterium. This is a second time I have biopsied this 06/30/2018; the patient states she is using her pumps at least once a day. She put silver alginate and a rolled towel in the area. She occasionally uses kerramax and/or ABD pads. She is decided that a application of silver nitrate I did because of very friable mucosa on this sometime in September is the reason that she could not use the pumps. I did not actually research this. This is clearly not the case 1/30; the patient states she is using her pumps once a day  but sometimes for as long as 2 hours. I told her that she still needs to do this and additional time a day. She is using silver alginate. She states the [facility] hurts when she uses her pumps therefore she is not using this. She has ABD pads that she is getting from home health. Dimensions perhaps slightly improved 3/5; the patient uses her pumps once a day but she states for is sometimes as long as 2 hours. She has not managed to do this twice a day. She is using silver alginate. Separating these areas with ABDs and rolled towels. There is no change in dimensions of this wound if anything slightly larger although the surfaces look better 4/2; I do not see too much difference 1 way or the other in the area on the left popliteal fossa. She is not using her pumps. She did describe an episode of pain and odor a week or 2 ago however she said it is mostly subsided. Still using silver alginate. 5/8; monthly follow-up for this large area in the left popliteal fossa. She is not using her compression pumps there is surrounding lymphedema from tissue in the posterior thigh in the posterior calf. Her home health nurse had called 2 weeks ago to report black drainage on the surface, she used silver alginate after that and it seems to have cleared up. 6/4; Patient is not using pumps. Finds silver alginate painful. I think she is just using ABD's in the area. She  has a new wound laterally to the original wound. Leaking copious lymphedema 7/9; monthly follow-up. Patient is using her pumps about once a week finds it painful. She uses some combination of silver calcium alginate. She has to buy her own supplies [place] and she uses an ABD. She has home health but her co-pay is such that it is cheaper for her to buy her supplies online 8/6-Patient is here for her monthly follow-up, she has started using her pumps again, apparently a couple of weeks ago she had a lot of pain and could not use the pumps, she is now  using silver alginate, she states her left posterior calf wound had some drainage that was greenish now converted to serous 10/13; 50-month follow-up. The patient's wound is on the left popliteal fossa. Fairly substantial area in the middle of 2 pannus folds the lower part of her thigh and the upper part of her calf. She has been using silver alginate. As usual she presents with greenish drainage but the wound does not look infected 12/8; 58-month follow-up. The patient now has 2 wounds in the popliteal fossa on the left. Fairly substantial area in the middle of 2 pannus folds in the lower part of her thigh and the upper part of her calf. She has been using silver alginate. There is a smaller wound laterally and posteriorly very difficult to see. The patient says she is using her compression pumps 3 times a week. She is not willing to separate the folds in this wound area. I am not sure I really understand the issue. 2/23; 57-month follow-up. I did not see a second wound this time. But otherwise I think the area on her left popliteal fossa is unchanged. The wound is between 2 fairly substantial pannus fold in the lower part of her thigh and the upper part of her calf. She has been using silver alginate which she buys online herself. She asks me about different dressings suggested by home health. She is not using her compression pumps and she does not keep these pannus fold separated. Objective Constitutional Vitals Time Taken: 3:08 PM, Height: 70 in, Source: Stated, Weight: 484 lbs, Source: Stated, BMI: 69.4, Temperature: 98.6 F, Pulse: 106 bpm, Respiratory Rate: 20 breaths/min, Blood Pressure: 131/62 mmHg. Cardiovascular Pedal pulses are palpable. Lymphedema. General Notes: Wound exam; substantial area in the left popliteal fossa. I do not think this has changed at all since I last saw this 2-1/2 months ago. I did not see any satellite wound wounds. No evidence of infection. Integumentary  (Hair, Skin) Skin is thick dry fissured. Wound #1 status is Open. Original cause of wound was Gradually Appeared. The wound is located on the Left,Posterior Knee. The wound measures 6cm length x 10cm width x 0.2cm depth; 47.124cm^2 area and 9.425cm^3 volume. There is Fat Layer (Subcutaneous Tissue) Exposed exposed. There is no tunneling or undermining noted. There is a large amount of serosanguineous drainage noted. The wound margin is flat and intact. There is large (67-100%) **red granulation within the wound bed. There is a small (1-33%) amount of necrotic tissue within the wound bed including Adherent Slough. General Notes: green tinged drainage Wound #3 status is Open. Original cause of wound was Gradually Appeared. The wound is located on the Left,Medial Lower Leg. The wound measures 0.3cm length x 0.6cm width x 0.1cm depth; 0.141cm^2 area and 0.014cm^3 volume. There is Fat Layer (Subcutaneous Tissue) Exposed exposed. There is no tunneling or undermining noted. There is a small amount  of serosanguineous drainage noted. The wound margin is thickened. There is large (67-100%) red granulation within the wound bed. There is no necrotic tissue within the wound bed. Assessment Active Problems ICD-10 Chronic venous hypertension (idiopathic) with inflammation of left lower extremity Lymphedema, not elsewhere classified Non-pressure chronic ulcer of left calf limited to breakdown of skin Type 2 diabetes mellitus with other skin ulcer Other obesity due to excess calories Plan Follow-up Appointments: Other: - 2 months Dressing Change Frequency: Wound #1 Left,Posterior Knee: Change dressing every day. Wound Cleansing: May shower and wash wound with soap and water. - also cleanse with Vashe solution Primary Wound Dressing: Wound #1 Left,Posterior Knee: Calcium Alginate with Silver Wound #3 Left,Medial Lower Leg: Calcium Alginate with Silver Secondary Dressing: Wound #1 Left,Posterior  Knee: Kerlix/Rolled Gauze ABD pad - or incontinence pads secure with transpore tape Edema Control: Avoid standing for long periods of time Elevate legs to the level of the heart or above for 30 minutes daily and/or when sitting, a frequency of: - throughout the day Exercise regularly Segmental Compressive Device. - lymphedema pumps 60 minutes 2 times per day! Off-Loading: Other: - place rolled towel or washcloth in skin fold behind left knee to separate skin in fold Additional Orders / Instructions: Follow Nutritious Diet - increase protein and vegetables to aid in wound healing. Home Health: [facility] for wound care. - [place]. 3 times per week; patient is homebound. 1. The wound is substantially unchanged which given everything is probably good news. 2. I previously biopsied this area twice there was no evidence of atypical infection or malignancy 3. In response to her home health nurse who asked about changing the dressing I reminded her that she buys these products online and pays out-of-pocket rather than paying the co-pay over insurance. Anything else I put in here would be expensive. I also told her that I would like her to follow my directions including putting a rolled towel or something similar over the ABD pad to keep the area separated. I would also like use of the compression pumps twice a day which she complains causes frequent urination and frequent bowel movements. I told her I doubt the latter was the case 4. I would consider changing dressings if she would be compliant on the basics of what I am asking her to do including keeping the pannus fold separated and using the compression pump Electronic Signature(s) Signed: 08/15/2019 6:00:34 PM By: Linton Ham MD Entered By: Linton Ham on 08/15/2019 16:06:06 -------------------------------------------------------------------------------- SuperBill Details Patient Name: Date of  Service: Danna Hefty 08/15/2019 Medical Record [id number] Patient Account Number: [id number] Date of Birth/Sex: Treating RN: 14-Oct-1965 (54 y.o. [name] Primary Care Provider: Fanny Bien Other Clinician: Referring Provider: Treating Provider/Extender:Jayvyn Haselton, Gennette Pac, Rojelio Brenner in Treatment: 149 Diagnosis Coding ICD-10 Codes Code Description I87.322 Chronic venous hypertension (idiopathic) with inflammation of left lower extremity I89.0 Lymphedema, not elsewhere classified L97.221 Non-pressure chronic ulcer of left calf limited to breakdown of skin E11.622 Type 2 diabetes mellitus with other skin ulcer E66.09 Other obesity due to excess calories Physician Procedures CPT4 Code Description: [id number] - WC PHYS LEVEL 3 - EST PT ICD-10 Diagnosis Description I87.322 Chronic venous hypertension (idiopathic) with inflammation extremity I89.0 Lymphedema, not elsewhere classified L97.221 Non-pressure chronic ulcer of  left calf limited to breakdo Modifier: of left lower wn of skin Quantity: 1 Electronic Signature(s) Signed: 08/15/2019 6:00:34 PM By: Linton Ham MD Entered By: Linton Ham on 08/15/2019  16:06:22 

## 2019-08-15 NOTE — Progress Notes (Addendum)
[name], [name] ([id number]) . Visit Report for 08/15/2019 Arrival Information Details Patient Name: Date of Service: [name], [name] 08/15/2019 2:15 PM Medical Record Number: [id number] Patient Account Number: [id number] Date of Birth/Sex: Treating RN: [birth year] (54 y.o. [name] Primary Care [name]: [name] Other Clinician: Referring [name]: Treating [name]/Extender: [name] in Treatment: 56 Visit Information History Since Last Visit Added or deleted any medications: No Patient Arrived: [name] Any new allergies or adverse reactions: No Arrival Time: 15:06 Had a fall or experienced change in No Accompanied By: self activities of daily living that may affect Transfer Assistance: None risk of falls: Patient Identification Verified: Yes Signs or symptoms of abuse/neglect since last visito No Secondary Verification Process Completed: Yes Hospitalized since last visit: No Patient Requires Transmission-Based Precautions: No Implantable device outside of the clinic excluding No Patient Has Alerts: No cellular tissue based products placed in the center since last visit: Has Dressing in Place as Prescribed: Yes Pain Present Now: Yes Electronic Signature(s) Signed: 08/15/2019 6:22:34 PM By: Sierra Gouty RN, BSN Entered By: [name] on 08/15/2019 15:08:05 -------------------------------------------------------------------------------- Clinic Level of Care Assessment Details Patient Name: Date of Service: Sierra CHELYNNE, KOLIN 08/15/2019 2:15 PM Medical Record Number: [id number] Patient Account Number: [id number] Date of Birth/Sex: Treating RN: [birth year] (54 y.o. [name] Primary Care [name]: [name] Other Clinician: Referring [name]: Treating [name]/Extender: [name] in Treatment: 149 Clinic Level of Care Assessment Items TOOL 4 Quantity  Score X- 1 0 Use when only an EandM is performed on FOLLOW-UP visit ASSESSMENTS - Nursing Assessment / Reassessment X- 1 10 Reassessment of Co-morbidities (includes updates in patient status) X- 1 5 Reassessment of Adherence to Treatment Plan ASSESSMENTS - Wound and Skin A ssessment / Reassessment []  - 0 Simple Wound Assessment / Reassessment - one wound X- 1 5 Complex Wound Assessment / Reassessment - multiple wounds []  - 0 Dermatologic / Skin Assessment (not related to wound area) ASSESSMENTS - Focused Assessment []  - 0 Circumferential Edema Measurements - multi extremities []  - 0 Nutritional Assessment / Counseling / Intervention []  - 0 Lower Extremity Assessment (monofilament, tuning fork, pulses) []  - 0 Peripheral Arterial Disease Assessment (using hand held doppler) ASSESSMENTS - Ostomy and/or Continence Assessment and Care []  - 0 Incontinence Assessment and Management []  - 0 Ostomy Care Assessment and Management (repouching, etc.) PROCESS - Coordination of Care X - Simple Patient / Family Education for ongoing care 1 15 []  - 0 Complex (extensive) Patient / Family Education for ongoing care []  - 0 Staff obtains Programmer, systems, Records, Ware Results / Process Orders est []  - 0 Staff telephones HHA, Nursing Homes / Clarify orders / etc []  - 0 Routine Transfer to another Facility (non-emergent condition) []  - 0 Routine Hospital Admission (non-emergent condition) []  - 0 New Admissions / Biomedical engineer / Ordering NPWT Apligraf, etc. , []  - 0 Emergency Hospital Admission (emergent condition) X- 1 10 Simple Discharge Coordination []  - 0 Complex (extensive) Discharge Coordination PROCESS - Special Needs []  - 0 Pediatric / Minor Patient Management []  - 0 Isolation Patient Management []  - 0 Hearing / Language / Visual special needs []  - 0 Assessment of Community assistance (transportation, D/C planning, etc.) []  - 0 Additional assistance / Altered  mentation []  - 0 Support Surface(s) Assessment (bed, cushion, seat, etc.) INTERVENTIONS - Wound Cleansing / Measurement []  - 0 Simple Wound Cleansing - one wound X- 2 5 Complex Wound Cleansing -  multiple wounds X- 1 5 Wound Imaging (photographs - any number of wounds) []  - 0 Wound Tracing (instead of photographs) []  - 0 Simple Wound Measurement - one wound X- 2 5 Complex Wound Measurement - multiple wounds INTERVENTIONS - Wound Dressings []  - 0 Small Wound Dressing one or multiple wounds X- 2 15 Medium Wound Dressing one or multiple wounds []  - 0 Large Wound Dressing one or multiple wounds X- 1 5 Application of Medications - topical []  - 0 Application of Medications - injection INTERVENTIONS - Miscellaneous []  - 0 External ear exam []  - 0 Specimen Collection (cultures, biopsies, blood, body fluids, etc.) []  - 0 Specimen(s) / Culture(s) sent or taken to Lab for analysis []  - 0 Patient Transfer (multiple staff / Civil Service fast streamer / Similar devices) []  - 0 Simple Staple / Suture removal (25 or less) []  - 0 Complex Staple / Suture removal (26 or more) []  - 0 Hypo / Hyperglycemic Management (close monitor of Blood Glucose) []  - 0 Ankle / Brachial Index (ABI) - do not check if billed separately X- 1 5 Vital Signs Has the patient been seen at the hospital within the last three years: Yes Total Score: 110 Level Of Care: New/Established - Level 3 Electronic Signature(s) Signed: 08/15/2019 6:06:02 PM By: Sierra Coria RN Entered By: [name] on 08/15/2019 15:58:19 -------------------------------------------------------------------------------- Encounter Discharge Information Details Patient Name: Date of Service: Sierra [name]. 08/15/2019 2:15 PM Medical Record Number: [id number] Patient Account Number: [id number] Date of Birth/Sex: Treating RN: [birth year] (54 y.o. [name] Primary Care [name]: [name] Other Clinician: Referring  [name]: Treating [name]/Extender: [name] in Treatment: 149 Encounter Discharge Information Items Discharge Condition: Stable Ambulatory Status: Wheelchair Discharge Destination: Home Transportation: Other Accompanied By: self Schedule Follow-up Appointment: Yes Clinical Summary of Care: Patient Declined Electronic Signature(s) Signed: 08/15/2019 6:05:13 PM By: Kela Millin Entered By: Kela Millin on 08/15/2019 16:27:55 -------------------------------------------------------------------------------- Lower Extremity Assessment Details Patient Name: Date of Service: Sierra JANNATUL, DROZE 08/15/2019 2:15 PM Medical Record Number: [id number] Patient Account Number: [id number] Date of Birth/Sex: Treating RN: [birth year] (55 y.o. [name] Primary Care Keaton Beichner: [name] Other Clinician: Referring Kinlee Garrison: Treating Braelynn Lupton/Extender: Leana Gamer Weeks in Treatment: 149 Edema Assessment Assessed: [Left: No] [Right: No] Edema: [Left: Ye] [Right: s] Calf Left: Right: Point of Measurement: cm From Medial Instep 55.7 cm cm Ankle Left: Right: Point of Measurement: cm From Medial Instep 30.2 cm cm Vascular Assessment Pulses: Dorsalis Pedis Palpable: [Left:Yes] Electronic Signature(s) Signed: 08/15/2019 6:22:34 PM By: Sierra Gouty RN, BSN Entered By: [name] on 08/15/2019 15:20:33 -------------------------------------------------------------------------------- Multi Wound Chart Details Patient Name: Date of Service: Noralee Space Ware. 08/15/2019 2:15 PM Medical Record Number: [id number] Patient Account Number: [id number] Date of Birth/Sex: Treating RN: Sep 23, 1965 (53 y.o. [name] Primary Care Navaeh Kehres: [name] Other Clinician: Referring Jaylise Peek: Treating Josue Falconi/Extender: [name] in Treatment: 149 Vital Signs Height(in):  70 Pulse(bpm): 106 Weight(lbs): 484 Blood Pressure(mmHg): 131/62 Body Mass Index(BMI): 69 Temperature(F): 98.6 Respiratory Rate(breaths/min): 20 Photos: [1:No Photos Left Knee - Posterior] [3:No Photos Left Lower Leg - Medial] [N/A:N/A N/A] Wound Location: [1:Gradually Appeared] [3:Gradually Appeared] [N/A:N/A] Wounding Event: [1:Diabetic Wound/Ulcer of the Lower] [3:Lymphedema] [N/A:N/A] Primary Etiology: [1:Extremity Type II Diabetes, Osteoarthritis] [3:Type II Diabetes, Osteoarthritis] [N/A:N/A] Comorbid History: [1:03/23/2016] [3:01/09/2019] [N/A:N/A] Date Acquired: [id number] [3:28] [N/A:N/A] Weeks of Treatment: [1:Open] [3:Open] [N/A:N/A] Wound Status: [1:6x10x0.2] [3:0.3x0.6x0.1] [N/A:N/A] Measurements L x W x  D (cm) [1:47.124] [3:0.141] [N/A:N/A] A (cm) : rea [1:9.425] [3:0.014] [N/A:N/A] Volume (cm) : [1:-72.40%] [3:63.40%] [N/A:N/A] % Reduction in Area: [1:65.50%] [3:63.20%] [N/A:N/A] % Reduction in Volume: [1:Grade 2] [3:Full Thickness Without Exposed] [N/A:N/A] Classification: [1:Large] [3:Support Structures Small] [N/A:N/A] Exudate Amount: [1:Serosanguineous] [3:Serosanguineous] [N/A:N/A] Exudate Type: [1:red, brown] [3:red, brown] [N/A:N/A] Exudate Color: [1:Flat and Intact] [3:Thickened] [N/A:N/A] Wound Margin: [1:Large (67-100%)] [3:Large (67-100%)] [N/A:N/A] Granulation Amount: [1:Red] [3:Red] [N/A:N/A] Granulation Quality: [1:Small (1-33%)] [3:None Present (0%)] [N/A:N/A] Necrotic Amount: [1:Fat Layer (Subcutaneous Tissue)] [3:Fat Layer (Subcutaneous Tissue)] [N/A:N/A] Exposed Structures: [1:Exposed: Yes Fascia: No Tendon: No Muscle: No Joint: No Bone: No Small (1-33%)] [3:Exposed: Yes Fascia: No Tendon: No Muscle: No Joint: No Bone: No Small (1-33%)] [N/A:N/A] Epithelialization: [1:green tinged drainage] [3:N/A] [N/A:N/A] Treatment Notes Electronic Signature(s) Signed: 08/15/2019 6:00:34 PM By: Linton Ham MD Signed: 08/15/2019 6:06:02 PM By: Sierra Coria  RN Entered By: Linton Ham on 08/15/2019 16:01:17 -------------------------------------------------------------------------------- Multi-Disciplinary Care Plan Details Patient Name: Date of Service: Noralee Space Ware. 08/15/2019 2:15 PM Medical Record Number: [id number] Patient Account Number: [id number] Date of Birth/Sex: Treating RN: 03/02/1966 (54 y.o. [name] Primary Care Jonus Coble: [name] Other Clinician: Referring Timmey Lamba: Treating Vandora Jaskulski/Extender: [name] in Treatment: 149 Active Inactive Wound/Skin Impairment Nursing Diagnoses: Impaired tissue integrity Goals: Patient/caregiver will verbalize understanding of skin care regimen Date Initiated: 09/16/2017 Target Resolution Date: 09/15/2019 Goal Status: Active Ulcer/skin breakdown will heal within 14 weeks Date Initiated: 04/12/2018 Date Inactivated: 05/03/2018 Target Resolution Date: 07/13/2018 Unmet Reason: not healed, comorbid Goal Status: Unmet morbid obesity Interventions: Assess patient/caregiver ability to perform ulcer/skin care regimen upon admission and as needed Notes: Electronic Signature(s) Signed: 08/15/2019 6:06:02 PM By: Sierra Coria RN Entered By: [name] on 08/15/2019 15:44:06 -------------------------------------------------------------------------------- Pain Assessment Details Patient Name: Date of Service: ASHLIEGH, FACEY 08/15/2019 2:15 PM Medical Record Number: [id number] Patient Account Number: [id number] Date of Birth/Sex: Treating RN: Apr 01, 1966 (54 y.o. [name] Primary Care Satin Boal: [name] Other Clinician: Referring Aaryn Sermon: Treating Bryann Mcnealy/Extender: [name] in Treatment: 149 Active Problems Location of Pain Severity and Description of Pain Patient Has Paino Yes Site Locations Pain Location: Pain Location: Pain in Ulcers With Dressing Change:  Yes Rate the pain. Current Pain Level: 5 Character of Pain Describe the Pain: Other: stinging Pain Management and Medication Current Pain Management: Medication: Yes Is the Current Pain Management Adequate: Adequate How does your wound impact your activities of daily livingo Sleep: No Bathing: No Appetite: No Relationship With Others: No Bladder Continence: No Emotions: No Bowel Continence: No Work: Yes Toileting: No Drive: No Dressing: No Hobbies: Astronomer) Signed: 08/15/2019 6:22:34 PM By: Sierra Gouty RN, BSN Entered By: [name] on 08/15/2019 15:08:53 -------------------------------------------------------------------------------- Patient/Caregiver Education Details Patient Name: Date of Service: Sierra Brown Human 2/23/2021andnbsp2:15 PM Medical Record Number: [id number] Patient Account Number: [id number] Date of Birth/Gender: Treating RN: May 11, 1966 (55 y.o. [name] Primary Care Physician: [name] Other Clinician: Referring Physician: Treating Physician/Extender: [name] in Treatment: 149 Education Assessment Education Provided To: Patient Education Topics Provided Wound/Skin Impairment: Methods: Explain/Verbal Responses: State content correctly Electronic Signature(s) Signed: 08/15/2019 6:06:02 PM By: Sierra Coria RN Entered By: [name] on 08/15/2019 15:44:51 -------------------------------------------------------------------------------- Wound Assessment Details Patient Name: Date of Service: Sierra RAENELLE, CRYSLER 08/15/2019 2:15 PM Medical Record Number: [id number] Patient Account Number: [id number] Date of Birth/Sex: Treating RN: [birth year] (54 y.o. [name] Primary Care Cindie Rajagopalan: Ernie Hew,  Mechele Claude Other Clinician: Referring Roiza Wiedel: Treating Terrel Manalo/Extender: [name] in Treatment: 149 Wound Status Wound Number:  1 Primary Etiology: Diabetic Wound/Ulcer of the Lower Extremity Wound Location: Left Knee - Posterior Wound Status: Open Wounding Event: Gradually Appeared Comorbid History: Type II Diabetes, Osteoarthritis Date Acquired: 03/23/2016 Weeks Of Treatment: 149 Clustered Wound: No Photos Wound Measurements Length: (cm) 6 Width: (cm) 10 Depth: (cm) 0.2 Area: (cm) 47.124 Volume: (cm) 9.425 % Reduction in Area: -72.4% % Reduction in Volume: 65.5% Epithelialization: Small (1-33%) Tunneling: No Undermining: No Wound Description Classification: Grade 2 Wound Margin: Flat and Intact Exudate Amount: Large Exudate Type: Serosanguineous Exudate Color: red, brown Foul Odor After Cleansing: No Slough/Fibrino Yes Wound Bed Granulation Amount: Large (67-100%) Exposed Structure Granulation Quality: Red Fascia Exposed: No Necrotic Amount: Small (1-33%) Fat Layer (Subcutaneous Tissue) Exposed: Yes Necrotic Quality: Adherent Slough Tendon Exposed: No Muscle Exposed: No Joint Exposed: No Bone Exposed: No Assessment Notes green tinged drainage Electronic Signature(s) Signed: 08/16/2019 5:09:01 PM By: Mikeal Hawthorne EMT/HBOT Signed: 12/19/2019 5:26:37 PM By: Sierra Coria RN Previous Signature: 08/15/2019 6:22:34 PM Version By: Sierra Gouty RN, BSN Entered By: Mikeal Hawthorne on 08/16/2019 16:03:13 -------------------------------------------------------------------------------- Wound Assessment Details Patient Name: Date of Service: Sierra [name]. 08/15/2019 2:15 PM Medical Record Number: [id number] Patient Account Number: [id number] Date of Birth/Sex: Treating RN: 01-Jul-1965 (54 y.o. [name] Primary Care Babbette Dalesandro: [name] Other Clinician: Referring Tieasha Larsen: Treating Imogene Gravelle/Extender: [name] in Treatment: 149 Wound Status Wound Number: 3 Primary Etiology: Lymphedema Wound Location: Left Lower Leg - Medial Wound Status:  Open Wounding Event: Gradually Appeared Comorbid History: Type II Diabetes, Osteoarthritis Date Acquired: 01/09/2019 Weeks Of Treatment: 28 Clustered Wound: No Photos Wound Measurements Length: (cm) 0.3 Width: (cm) 0.6 Depth: (cm) 0.1 Area: (cm) 0.141 Volume: (cm) 0.014 % Reduction in Area: 63.4% % Reduction in Volume: 63.2% Epithelialization: Small (1-33%) Tunneling: No Undermining: No Wound Description Classification: Full Thickness Without Exposed Support Structures Wound Margin: Thickened Exudate Amount: Small Exudate Type: Serosanguineous Exudate Color: red, brown Foul Odor After Cleansing: No Slough/Fibrino No Wound Bed Granulation Amount: Large (67-100%) Exposed Structure Granulation Quality: Red Fascia Exposed: No Necrotic Amount: None Present (0%) Fat Layer (Subcutaneous Tissue) Exposed: Yes Tendon Exposed: No Muscle Exposed: No Joint Exposed: No Bone Exposed: No Electronic Signature(s) Signed: 08/16/2019 5:09:01 PM By: Mikeal Hawthorne EMT/HBOT Signed: 12/19/2019 5:26:37 PM By: Sierra Coria RN Previous Signature: 08/15/2019 6:22:34 PM Version By: Sierra Gouty RN, BSN Entered By: Mikeal Hawthorne on 08/16/2019 16:07:42 -------------------------------------------------------------------------------- Vitals Details Patient Name: Date of Service: Sierra [name]. 08/15/2019 2:15 PM Medical Record Number: [id number] Patient Account Number: [id number] Date of Birth/Sex: Treating RN: 1966/02/02 (54 y.o. Martyn Malay, Linda Primary Care Akosua Constantine: [name] Other Clinician: Referring Christean Silvestri: Treating Glen Kesinger/Extender: [name] in Treatment: 149 Vital Signs Time Taken: 15:08 Temperature (F): 98.6 Height (in): 70 Pulse (bpm): 106 Source: Stated Respiratory Rate (breaths/min): 20 Weight (lbs): 484 Blood Pressure (mmHg): 131/62 Source: Stated Reference Range: 80 - 120 mg / dl Body Mass Index (BMI):  69.4 Electronic Signature(s) Signed: 08/15/2019 6:22:34 PM By: Sierra Gouty RN, BSN Entered By: [name] on 08/15/2019 15:10:46

## 2019-10-10 ENCOUNTER — Other Ambulatory Visit: Payer: Self-pay

## 2019-10-10 ENCOUNTER — Other Ambulatory Visit (HOSPITAL_COMMUNITY)
Admission: RE | Admit: 2019-10-10 | Discharge: 2019-10-10 | Disposition: A | Discharge status: Home / Self Care | Payer: Medicare PPO | Source: Other Acute Inpatient Hospital | Attending: Internal Medicine | Admitting: Internal Medicine

## 2019-10-10 ENCOUNTER — Encounter (HOSPITAL_BASED_OUTPATIENT_CLINIC_OR_DEPARTMENT_OTHER): Payer: Medicare PPO | Attending: Internal Medicine | Admitting: Internal Medicine

## 2019-10-10 DIAGNOSIS — I890 Lymphedema, not elsewhere classified: Secondary | ICD-10-CM | POA: Diagnosis not present

## 2019-10-10 DIAGNOSIS — Z09 Encounter for follow-up examination after completed treatment for conditions other than malignant neoplasm: Secondary | ICD-10-CM | POA: Insufficient documentation

## 2019-10-10 DIAGNOSIS — E11622 Type 2 diabetes mellitus with other skin ulcer: Secondary | ICD-10-CM | POA: Insufficient documentation

## 2019-10-10 DIAGNOSIS — L97221 Non-pressure chronic ulcer of left calf limited to breakdown of skin: Secondary | ICD-10-CM | POA: Diagnosis not present

## 2019-10-10 DIAGNOSIS — I87332 Chronic venous hypertension (idiopathic) with ulcer and inflammation of left lower extremity: Secondary | ICD-10-CM | POA: Diagnosis not present

## 2019-10-10 DIAGNOSIS — L97822 Non-pressure chronic ulcer of other part of left lower leg with fat layer exposed: Secondary | ICD-10-CM | POA: Diagnosis not present

## 2019-10-10 DIAGNOSIS — N3941 Urge incontinence: Secondary | ICD-10-CM | POA: Insufficient documentation

## 2019-10-10 DIAGNOSIS — Z6841 Body Mass Index (BMI) 40.0 and over, adult: Secondary | ICD-10-CM | POA: Insufficient documentation

## 2019-10-10 DIAGNOSIS — M170 Bilateral primary osteoarthritis of knee: Secondary | ICD-10-CM | POA: Diagnosis not present

## 2019-10-10 NOTE — Progress Notes (Signed)
[name], [name] ([id number]) Visit Report for 10/10/2019 Arrival Information Details Patient Name: Date of Service: [name], [name] 10/10/2019 2:30 PM Medical Record [id number] Patient Account Number: [id number] Date of Birth/Sex: Treating RN: 26-Dec-1965 (54 y.o. [name] Primary Care [name]: [name] Other Clinician: Referring [name]: Treating [name]/Extender:Robson, Gennette Pac, Rojelio Brenner in Treatment: 43 Visit Information History Since Last Visit Walker Added or deleted any medications: No Patient Arrived: Any new allergies or adverse reactions: No Arrival Time: 14:54 Had a fall or experienced change in No Accompanied By: self activities of daily living that may affect Transfer Assistance: None risk of falls: Patient Identification Verified: Yes Signs or symptoms of abuse/neglect since last No Secondary Verification Process Completed: Yes visito Patient Requires Transmission-Based No Hospitalized since last visit: No Precautions: Implantable device outside of the clinic excluding No Patient Has Alerts: No cellular tissue based products placed in the center since last visit: Has Dressing in Place as Prescribed: Yes Pain Present Now: Yes Electronic Signature(s) Signed: 10/10/2019 6:13:22 PM By: Baruch Gouty RN, BSN Entered By: Baruch Gouty on 10/10/2019 14:58:37 -------------------------------------------------------------------------------- Clinic Level of Care Assessment Details Patient Name: Date of Service: [name], [name] 10/10/2019 2:30 PM Medical Record [id number] Patient Account Number: [id number] Date of Birth/Sex: Treating RN: 05-01-1966 (54 y.o. [name] Primary Care [name]: [name] Other Clinician: Referring [name]: Treating [name]/Extender:Robson, Gennette Pac, Rojelio Brenner in Treatment: [facility] Level of Care Assessment Items TOOL 4 Quantity Score X -  Use when only an EandM is performed on FOLLOW-UP visit 1 0 ASSESSMENTS - Nursing Assessment / Reassessment X - Reassessment of Co-morbidities (includes updates in patient status) 1 10 X - Reassessment of Adherence to Treatment Plan 1 5 ASSESSMENTS - Wound and Skin Assessment / Reassessment X - Simple Wound Assessment / Reassessment - one wound 1 5 []  - Complex Wound Assessment / Reassessment - multiple wounds 0 []  - Dermatologic / Skin Assessment (not related to wound area) 0 ASSESSMENTS - Focused Assessment []  - Circumferential Edema Measurements - multi extremities 0 []  - Nutritional Assessment / Counseling / Intervention 0 []  - Lower Extremity Assessment (monofilament, tuning fork, pulses) 0 []  - Peripheral Arterial Disease Assessment (using hand held doppler) 0 ASSESSMENTS - Ostomy and/or Continence Assessment and Care []  - Incontinence Assessment and Management 0 []  - Ostomy Care Assessment and Management (repouching, etc.) 0 PROCESS - Coordination of Care X - Simple Patient / Family Education for ongoing care 1 15 []  - Complex (extensive) Patient / Family Education for ongoing care 0 X - Staff obtains Programmer, systems, Records, Test Results / Process Orders 1 10 []  - Staff telephones HHA, Nursing Homes / Clarify orders / etc 0 []  - Routine Transfer to another Facility (non-emergent condition) 0 []  - Routine Hospital Admission (non-emergent condition) 0 []  - New Admissions / Biomedical engineer / Ordering NPWT, Apligraf, etc. 0 []  - Emergency Hospital Admission (emergent condition) 0 X - Simple Discharge Coordination 1 10 []  - Complex (extensive) Discharge Coordination 0 PROCESS - Special Needs []  - Pediatric / Minor Patient Management 0 []  - Isolation Patient Management 0 []  - Hearing / Language / Visual special needs 0 []  - Assessment of Community assistance (transportation, D/C planning, etc.) 0 []  - Additional assistance / Altered mentation 0 []  - Support Surface(s) Assessment  (bed, cushion, seat, etc.) 0 INTERVENTIONS - Wound Cleansing / Measurement X - Simple Wound Cleansing - one wound 1 5 []  - Complex Wound Cleansing - multiple wounds 0  X - Wound Imaging (photographs - any number of wounds) 1 5 []  - Wound Tracing (instead of photographs) 0 X - Simple Wound Measurement - one wound 1 5 []  - Complex Wound Measurement - multiple wounds 0 INTERVENTIONS - Wound Dressings []  - Small Wound Dressing one or multiple wounds 0 X - Medium Wound Dressing one or multiple wounds 1 15 []  - Large Wound Dressing one or multiple wounds 0 X - Application of Medications - topical 1 5 []  - Application of Medications - injection 0 INTERVENTIONS - Miscellaneous []  - External ear exam 0 []  - Specimen Collection (cultures, biopsies, blood, body fluids, etc.) 0 []  - Specimen(s) / Culture(s) sent or taken to Lab for analysis 0 []  - Patient Transfer (multiple staff / Civil Service fast streamer / Similar devices) 0 []  - Simple Staple / Suture removal (25 or less) 0 []  - Complex Staple / Suture removal (26 or more) 0 []  - Hypo / Hyperglycemic Management (close monitor of Blood Glucose) 0 []  - Ankle / Brachial Index (ABI) - do not check if billed separately 0 X - Vital Signs 1 5 Has the patient been seen at the hospital within the last three years: Yes Total Score: 95 Level Of Care: New/Established - Level 3 Electronic Signature(s) Signed: 10/10/2019 5:39:56 PM By: Carlene Coria RN Entered By: Carlene Coria on 10/10/2019 15:55:12 -------------------------------------------------------------------------------- Encounter Discharge Information Details Patient Name: Date of Service: Sierra Ao T. 10/10/2019 2:30 PM Medical Record [id number] Patient Account Number: [id number] Date of Birth/Sex: Treating RN: April 07, 1966 (54 y.o. [name] Primary Care [name]: [name] Other Clinician: Referring [name]: Treating [name]/Extender:Robson, Gennette Pac, Rojelio Brenner in Treatment: [phone] Encounter Discharge Information Items Discharge Condition: Stable Ambulatory Status: Walker Discharge Destination: Home Transportation: Other Accompanied By: self Schedule Follow-up Appointment: Yes Clinical Summary of Care: Patient Declined Electronic Signature(s) Signed: 10/10/2019 5:57:12 PM By: Kela Millin Entered By: Kela Millin on 10/10/2019 16:35:41 -------------------------------------------------------------------------------- Multi Wound Chart Details Patient Name: Date of Service: Sierra Ao T. 10/10/2019 2:30 PM Medical Record [id number] Patient Account Number: [id number] Date of Birth/Sex: Treating RN: 1965-06-27 (54 y.o. 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] [N/A:N/A] Comorbid History: [1:Type II Diabetes, Osteoarthritis] [3:Type II Diabetes, Osteoarthritis] [N/A:N/A] Date Acquired: [1:03/23/2016] [3:01/09/2019] [N/A:N/A] Weeks of Treatment: [1:157] [3:36] [N/A:N/A] Wound Status: [1:Open] [3:Healed - Epithelialized] [N/A:N/A] Measurements L x W x D [1:7x9.5x0.2] [3:0x0x0] [N/A:N/A] (cm) Area (cm) : [1:52.229] [3:0] [N/A:N/A] Volume (cm) : [1:10.446] [3:0] [N/A:N/A] % Reduction in Area: [1:-91.10%] [3:100.00%] [N/A:N/A] % Reduction in Volume: [1:61.80%] [3:100.00%] [N/A:N/A] Classification: [1:Grade 2] [3:Full Thickness Without  Exposed Support Structures] [N/A:N/A] Exudate Amount: [1:Large] [3:None Present] [N/A:N/A] Exudate Type: [1:Serosanguineous] [3:N/A] [N/A:N/A] Exudate Color: [1:red, brown] [3:N/A] [N/A:N/A] Wound Margin: [1:Flat and Intact] [3:Thickened] [N/A:N/A] Granulation Amount: [1:Large (67-100%)] [3:None Present (0%)] [N/A:N/A] Granulation Quality: [1:Red, Friable] [3:N/A] [N/A:N/A] Necrotic Amount: [1:Small (1-33%)] [3:None Present (0%)] [N/A:N/A] Exposed Structures: [1:Fat Layer (Subcutaneous Tissue) Exposed: Yes Fascia: No Tendon: No Muscle: No Joint: No Bone: No Small (1-33%)] [3:Fascia: No Fat Layer (Subcutaneous Tissue) Exposed: No Tendon: No Muscle: No Joint: No Bone: No Large (67-100%)] [N/A:N/A  N/A] Treatment Notes Electronic Signature(s) Signed: 10/10/2019 5:39:56 PM By: Carlene Coria RN Signed: 10/10/2019 5:47:38 PM By: Linton Ham MD Entered By: Linton Ham on 10/10/2019 15:50:48 -------------------------------------------------------------------------------- Multi-Disciplinary Care Plan Details Patient Name: Date of Service: Sierra Ao T. 10/10/2019 2:30 PM Medical Record [id number] Patient Account Number: [id number] Date of Birth/Sex: Treating RN: Feb 24, 1966 (54 y.o. [name] Primary Care Chrislynn Mosely: [name] Other Clinician: Referring Fard Borunda: Treating Kaila Devries/Extender:Robson, Gennette Pac, Rojelio Brenner in Treatment: 157 Active Inactive Wound/Skin Impairment Nursing Diagnoses: Impaired tissue integrity Goals: Patient/caregiver will verbalize understanding of skin care regimen Date Initiated: 09/16/2017 Target Resolution Date: 10/16/2019 Goal Status: Active Ulcer/skin breakdown will heal within 14 weeks Date Initiated: 04/12/2018 Date Inactivated: 05/03/2018 Target Resolution Date: 07/13/2018 Unmet Reason: not healed, Goal Status: Unmet comorbid morbid obesity Interventions: Assess patient/caregiver ability to perform ulcer/skin  care regimen upon admission and as needed Notes: Electronic Signature(s) Signed: 10/10/2019 5:39:56 PM By: Carlene Coria RN Entered By: Carlene Coria on 10/10/2019 14:51:34 -------------------------------------------------------------------------------- Pain Assessment Details Patient Name: Date of Service: [name], [name] 10/10/2019 2:30 PM Medical Record [id number] Patient Account Number: [id number] Date of Birth/Sex: Treating RN: 27-Jun-1965 (54 y.o. [name] Primary Care Peirce Deveney: [name] Other Clinician: Referring Hazem Kenner: Treating Alaiza Yau/Extender:Robson, Gennette Pac, Rojelio Brenner in Treatment: 157 Active Problems Location of Pain Severity and Description of Pain Patient Has Paino Yes Site Locations Pain Location: Pain in Ulcers With Dressing Change: Yes Duration of the Pain. Constant / Intermittento Intermittent Rate the pain. Current Pain Level: 6 Least Pain Level: 0 Character of Pain Describe the Pain: Aching, Dull Pain Management and Medication Current Pain Management: Medication: Yes Is the Current Pain Management Adequate: Adequate How does your wound impact your activities of daily livingo Sleep: No Bathing: No Appetite: No Relationship With Others: No Bladder Continence: No Emotions: Yes Bowel Continence: No Work: No Toileting: No Drive: No Dressing: No Hobbies: No Electronic Signature(s) Signed: 10/10/2019 6:13:22 PM By: Baruch Gouty RN, BSN Entered By: Baruch Gouty on 10/10/2019 15:01:03 -------------------------------------------------------------------------------- Patient/Caregiver Education Details Patient Name: Date of Service: [name] 4/20/2021andnbsp2:30 PM Medical Record [id number] Patient Account Number: [id number] Date of Birth/Gender: Treating RN: 09-Oct-1965 (54 y.o. [name] Primary Care Physician: [name] Other Clinician: Referring Physician:  Treating Physician/Extender:Robson, Gennette Pac, Rojelio Brenner in Treatment: 157 Education Assessment Education Provided To: Patient Education Topics Provided Wound/Skin Impairment: Methods: Explain/Verbal Responses: State content correctly Electronic Signature(s) Signed: 10/10/2019 5:39:56 PM By: Carlene Coria RN Entered By: Carlene Coria on 10/10/2019 14:51:54 -------------------------------------------------------------------------------- Wound Assessment Details Patient Name: Date of Service: [name], [name] 10/10/2019 2:30 PM Medical Record [id number] Patient Account Number: [id number] Date of Birth/Sex: Treating RN: 08-25-1965 (54 y.o. [name] Primary Care Jakiah Bienaime: [name] Other Clinician: Referring Wendell Nicoson: Treating Broadus Costilla/Extender:Robson, Gennette Pac, Rojelio Brenner in Treatment: 157 Wound Status Wound Number: 1 Primary Diabetic Wound/Ulcer of the Lower Etiology: Extremity Wound Location: Left, Posterior Knee Wound Status: Open Wounding Event: Gradually Appeared Comorbid Type II Diabetes, Osteoarthritis Date Acquired: 03/23/2016 History: Weeks Of Treatment: 157 Clustered Wound: No Wound Measurements Length: (cm) 7 % Reduct Width: (cm) 9.5 % Reduct Depth: (cm) 0.2 Epitheli Area: (cm) 52.229 Tunneli Volume: (cm) 10.446 Undermi Wound Description Classification: Grade 2 Wound Margin: Flat and Intact Exudate Amount: Large Exudate Type: Serosanguineous Exudate Color: red, brown Wound Bed Granulation Amount: Large (67-100%) Granulation Quality: Red, Friable Necrotic Amount: Small (1-33%) Necrotic Quality: Adherent Slough Foul Odor After Cleansing: No Slough/Fibrino Yes Exposed Structure Fascia  Exposed: No Fat Layer (Subcutaneous Tissue) Exposed: Yes Tendon Exposed: No Muscle Exposed: No Joint Exposed: No Bone Exposed: No ion in Area: -91.1% ion in Volume: 61.8% alization: Small (1-33%) ng: No ning:  No Treatment Notes Wound #1 (Left, Posterior Knee) 1. Cleanse With Wound Cleanser 3. Primary Dressing Applied Calcium Alginate Ag 4. Secondary Dressing ABD Pad Dry Gauze Other secondary dressing (specify in notes) Notes patient requested to use her own padding (incontinence pads) and tape to secure dressing Electronic Signature(s) Signed: 10/10/2019 6:13:22 PM By: Baruch Gouty RN, BSN Entered By: Baruch Gouty on 10/10/2019 15:18:02 -------------------------------------------------------------------------------- Wound Assessment Details Patient Name: Date of Service: [name] 10/10/2019 2:30 PM Medical Record [id number] Patient Account Number: [id number] Date of Birth/Sex: Treating RN: December 26, 1965 (54 y.o. [name] Primary Care Antion Andres: [name] Other Clinician: Referring Jomel Whittlesey: Treating Belton Peplinski/Extender:Robson, Gennette Pac, Rojelio Brenner in Treatment: 157 Wound Status Wound Number: 3 Primary Etiology: Lymphedema Wound Location: Left, Medial Lower Leg Wound Status: Healed - Epithelialized Wounding Event: Gradually Appeared Comorbid History: Type II Diabetes, Osteoarthritis Date Acquired: 01/09/2019 Weeks Of Treatment: 36 Clustered Wound: No Wound Measurements Length: (cm) 0 % Reduct Width: (cm) 0 % Reduct Depth: (cm) 0 Epitheli Area: (cm) 0 Tunneli Volume: (cm) 0 Undermi Wound Description Classification: Full Thickness Without Exposed Support Foul Odo Structures Slough/F Wound Thickened Margin: Exudate None Present Amount: Wound Bed Granulation Amount: None Present (0%) Necrotic Amount: None Present (0%) Fascia E Fat Laye Tendon E Muscle E Joint Ex Bone Exp r After Cleansing: No ibrino No Exposed Structure xposed: No r (Subcutaneous Tissue) Exposed: No xposed: No xposed: No posed: No osed: No ion in Area: 100% ion in Volume: 100% alization: Large (67-100%) ng: No ning: No Electronic  Signature(s) Signed: 10/10/2019 6:13:22 PM By: Baruch Gouty RN, BSN Entered By: Baruch Gouty on 10/10/2019 15:19:13 -------------------------------------------------------------------------------- Vitals Details Patient Name: Date of Service: [name] 10/10/2019 2:30 PM Medical Record [id number] Patient Account Number: [id number] Date of Birth/Sex: Treating RN: September 10, 1965 (54 y.o. [name] Primary Care Dathan Attia: [name] Other Clinician: Referring Merlen Gurry: Treating Nakya Weyand/Extender:Robson, Gennette Pac, Rojelio Brenner in Treatment: 157 Vital Signs Time Taken: 14:58 Temperature (F): 98.9 Height (in): 70 Pulse (bpm): 112 Source: Stated Respiratory Rate (breaths/min): 20 Weight (lbs): 484 Blood Pressure (mmHg): 130/72 Source: Stated Reference Range: 80 - 120 mg / dl Body Mass Index (BMI): 69.4 Electronic Signature(s) Signed: 10/10/2019 6:13:22 PM By: Baruch Gouty RN, BSN Entered By: Baruch Gouty on 10/10/2019 14:59:16

## 2019-10-10 NOTE — Progress Notes (Signed)
ELLASYN, LIPS ([id number]) Visit Report for 10/10/2019 HPI Details Patient Name: Date of Service: [name], [name] 10/10/2019 2:30 PM Medical Record [id number] Patient Account Number: [id number] Date of Birth/Sex: Treating RN: 1966/06/05 (54 y.o. [name] Primary Care Provider: Fanny Bien Other Clinician: Referring Provider: Treating Provider/Extender:Mycah Mcdougall, Gennette Pac, Rojelio Brenner in Treatment: 157 History of Present Illness Location: Left popliteal fossa Duration: She was aware of the wound October 2017 Context: The development/occurrence is unclear Modifying Factors: Chronic pressure secondary to thigh pannus HPI Description: 10/01/16- She is here for initial evaluation of her left popliteal fossa ulcer. She states that she noticed the area in October 2017. She is unaware of actual causative factor, states she has a history of eczema and uses a brush for bathing. She was being treated by her PCP, using Aquaphor since then, and stopped using last week. She denies ever being on an antibiotic for this ulcer. She states that this drains excessively and soaks through her clothes, she is unable to visualize this area and has a friend that helps with the dressing changes. She was recently diagnosed with diabetes; A1c in December was 9; down to 7.5-8 in February. She is being managed on oral agents. She is a non-smoker. She has never been evaluated for venous reflux or lymphedema. 10/08/16- She is here in follow up regarding her left popliteal fossa pressure ulcer. She states that she did not receive any supplies as ordered. She states that the dressing material that was ordered and placed last week was in place for several days. She states that the odor and drainage is unchanged. She states that her PCP has written for 1 month off work, starting Monday 4/23. The culture obtained last week was mixed skin flora, no identifiable pathogen. 10/15/16;  difficult wound in the left popliteal fossa in the setting of morbid obesity and lymphedema. We have been using silver alginate although it is not really clear to me that this stays on for very long. She changes it once. Covering with AVD pads to try and separate wound from the surrounding pannus. A culture of this area showed both enterococcus and Enterobacter and I have her on a combination of amoxicillin and cipro. Although most Gram positives will be covered by Cipro, a big exception is Enterobacter particularly Enterobacter faecalis which generally requires ampicillin if indeed it is sensitive. A final issue is that the patient is limited by the co-pay on her insurance with regards to actual wound care supplies. I didn't get into this in much detail although he crossed my mind that we may need to bring her back more than once a week for dressing changes. 10/22/16; difficult wound in the left popliteal fossa. I think this is a lymphedema type issue in the middle lobe folds in the back of her knee. We have been using silver alginate, ABD pads. Dimensions today are improved, base of the wound is improved. I had some thoughts about biopsying this however overall this is improved today and all put that off for now 10/29/16; difficult wound in the left popliteal fossa which I think is a pressure wound from associated large folds of lymphedema. We have been using silver alginate. 11/05/16; difficult wound in the left popliteal fossa. Using silver alginate change 3x/wk. Wound measuring slightly smaller 11/12/16; difficult wound in the setting of chronic lymphedema. Left popliteal fossa. Using so over alginate change 3 times a week, once here and wants by home health and/or the patient's friend who  is a Marine scientist. The dimensions of this appear to be improving, the surface is certainly improved [id number]; Patient now has lymphedema pumps. wound is slightly reduced in width. Still using silver alginate 11/26/16;  patient has lymphedema pumps although she is having trouble with them in terms of fit. Nevertheless she tells me she is using them. She is still using silver alginate as the primary dressing 12/03/16; patient has lymphedema pumps and there is been significant improvement in the swelling in the leg. We've been using silver alginate but really only making minimal improvements if any in the overall wound area although the base of the wound is a lot better than when she first came here. 12/10/16; patient has increasing pain, drainage and odor this week. States this began to get worse over the weekend. She is been using her pumps at home and the edema is certainly better in the leg. We changed to [facility] Blue last week. She has home health coming out once a week to change the dressing 12/17/16; culture I did last week showed a few MSSA. We called her in cephalexin at 500 4 times a day. She admits to less than 100% compliance with the 4 times a day dosing. I have encouraged her today. The surrounding tenderness and erythema in this wound is a lot better. Nursing reports that the diameter of the wound is down a centimeter. Healthy-looking wound base no surrounding tenderness. We continue to use silver alginate 12/24/16; she is still completing her antibiotics apparently not taking them exactly as prescribed. The wound is perhaps marginally smaller. Surface of this continues to look not 100% viable. We have been using silver collagen. Consider changing to Iodoflex 12/31/16; she is finished her antibiotics. There is no evidence to suspect ongoing infection in the wound. We have looked back over the pictures of the wound in the left popliteal fossa. She came in with a thick adherent necrotic eschar and now we are dealing with slight surface slough. No debridement will be planned today. I'm going to try [facility] in lieu of the silver alginate starting today 01/07/17; patient continues to do well using  Hydrofera Blue. Reliably using her external compression pumps twice a day 01/15/17; the patient has been using [facility] with a general improvement in her wound condition and size. However she went back to work yesterday it would seem that her use of the compression pumps this week is been intermittent and at work she is unable to change the dressings. Per our intake nurse arrived with a macerated dressing in place. 01/22/17 on evaluation today patient appears to be doing a little bit worse. She is having more discomfort and there's a erythema surrounding the wound bed behind her left knee. She states this discomfort often is associated with infection and in fact this wound looks to likely be infected. She has no fevers, chills, nausea, vomiting, or diarrhea. 02/12/17; the patient's wound is not as good as I thought I would find it. She was making a good improvement when I saw her a month ago. She went back to work and largely she blames difficulties at work for this. Apparently during my absence she was treated with oral antibiotics starting on 8/3. I don't believe there are any cultures 02/19/17; patient arrives in clinic today with the wound is essentially unchanged in terms of dimensions and wound appearance. We have been using silver alginate. She claims compliance with the compression pumps twice a day 02/25/17; no major change in the  condition of the wound. We used Iodo flex last week with the purpose of trying to get a better-looking surface. She could not tolerate this because of discomfort. She found this especially bad when she used her compression pumps. Before this we use silver alginate again with not a lot of success. I changed her to [employer] which will certainly not have much debridement activity however I will probably change to weekly mechanical debridements. This will help with the drainage. She is now at home/on short-term disability. 03/11/17; the patient is now on  disability. She is been using PolyMem AG wound for the first time looks a little better this week and dimensions have improved. She is compliant with her external compression pumps 03/18/16; the patient is now on disability at home using her palms. We've been using PolyMen AG. Wound certainly looks smaller. Arrives today with an adherent surface requiring debridement. 04/08/17; patient has not been here in 3 weeks. Using Anasept with PolyMem AG. She arrives today with an adherent surface over the wound. She did not wish debridement, there was edema fluid dripping through the wound surface even though she states she is using her external compression pumps 04/15/17; patient is using Anasept with PolyMem and AG. She arrives today telling us that her leg is been hurting since a home care nurse staff her dressing into her on Sunday therefore she could not use her compression pumps in spite of this her dimensions appear to be better although this is a very difficult wound to measure in the popliteal fossa of the left knee 04/22/17; arrives today in clinic using Anasept and PolyMem AG. She is using her compression pumps per her description. The wound surface however once again is not going to allow healing. She still has weeping edema fluid coming through the wound. No major change in dimensions 04/29/17; arrives today with the major deterioration in the wound both in terms of dimensions and condition of the wound surface. She reports increasing pain since earlier in the week at our intake nurse reports purulent drainage on the ABD pads.this is not going in the right direction. We put Hydrofera Blue on this last week but I'm going to change back to [employer] because of the drainage. I reviewed previous cultures last done in June showed methicillin sensitive staph aureus. We had been making progress with this wound presenting initially with a necrotic wound on the popliteal fossa on the right. We  managed to get a viable surface and some reduction in wound dimensions however in the past 4 weeks this is been getting gradually worse and today much worse in terms of both size and condition of the wound surface. The patient is having pain and will require empiric antibiotics because of the purulent drainage and deterioration in the wound 05/20/17; I haven't seen this patient in 3 weeks. Biopsy of this wound I did last time suggested extensive granulation tissue with collections of neutrophils lymphocytes plasma cells histiocytes and multinucleated giant cells. There was no evidence of malignancy. Deep tissue culture I did showed MSSA and I gave her Keflex when she was here last time and renewed this for a week however she is still taking the medication I'm not certain she actually took it properly.the pathologist suggested the possibility of pyoderma gangrenosum, a vasculopathy. PAS stains and acid-fast stains were negative She also tells me that she is having mobility problems secondary to pain in both knees. She uses a walker but can barely make it to  the bathroom. It sounds as though she has a history of chronic osteoarthritis and she tells me that she had a left anterior cruciate ligament tear at some time in the past 06/10/17; this is a patient that I haven't seen in 3 weeks. She is completed her antibiotics. Biopsy suggested the possibility of infection versus pyoderma gangrenosum. I gave her 2 weeks of Keflex directed at [facility] she is repeated this.she called last week to request more antibiotics but I wanted to see her. She arrives today with her aunt. Apparently the patient has deteriorated in terms of function. According the patient and her aunt she can no longer ambulate or easily transfer. She is incontinent because she has urge incontinence etc. They requested admission to a rehabilitation facility or the hospital and then a rehabilitation facility. I was not aware that the  patient was declining in terms a shin but according to our staff that's been the case they haven't seen her walk in the him any months that she's been here. I wasn't really aware of this. In fact the patient was working up until the end of August. In any case she arrives with the wound a lot worse 07/01/17; this is a patient I haven't seen in 3 weeks. The last time she was here at the request of her family member we sent her to the ER. She apparently was kept overnight. She was treated for UTI. Attempts were made to find her long-term care although there was insurance and cost issues and she is back home. She now has her aunt living with her. I am not clear what they are using to the wound on the posterior left knee/popliteal fossa. Last biopsy I did of this area did not document pyoderma which had been raised by another/previous biopsy. We are not clear today what they're actually putting on the wound 07/15/17; she arrives today with improvement in her wound dimensions although there is still weeping edema fluid. She claims she is changing the dressing [silver alginate] twice daily. I've suggested increasing this to 3 times a day. She is changing the silver alginate twice a day 07/29/17; patient complains of pain drainage and odor coming from the wound which was different from 2 weeks ago.. We have been using silver alginate she has been compliant with her compression pumps 09/02/17; the patient continues to complain of pain and drainage. I gave her empiric antibiotics when she was last here 5 weeks ago but she says she only completed them recently. She seems confused about the time frame of her last visit here.she states she was concerned about an odor and stopped using the [facility] Blue on the wound in her left popliteal fossa about a week ago. I don't think she is dressing this with anything since. She is using her lymphedema pumps once a day.previous biopsy of done of this area showed  inflammation associated with stasis dermatitis or trauma She is still not walking apparently because of severe osteoarthritis in both knees. She thinks she'll be going back to work in June and she needs to be ambulatory although I really can't see this happening. 09/16/17; the patient is been using a mixture of Hydrofera Blue and PolyMen that she had left over. I'm not really sure how reliable this is. She tells me she is using her external compression pumps once a day. She had a steroid shot in her left knee this morning 09/30/17; 2 week follow-up for this patient with a difficult wound behind her left  popliteal fossa. She has home health. They're using Hydrofera Blue. Measurement slightly smaller. She states she is using her compression pumps once a day 10/14/17; 2 week follow-up for this woman with a difficult wound with find her left popliteal fossa. She has home health. Using Hydrofera Blue. Measurements again today slightly smaller. Most of the surface of this covered in a nonviable tightly green adherent material. Some of this may be retained Hydrofera Blue nevertheless was debrided with great difficulty. She asked me about her upcoming disability which I think happens in June. I told her that from my point of view the wound itself does not qualify her to be disabled by itself. Now I'm aware that she can no longer stand and walk. This really deteriorated quite a bit during the course of the time she is been here. I suspect this is due to severe osteoarthritis of both knees and she is seen orthopedics for this. I think this is a source of her disability but I don't really feel comfortable with this given the fact that I don't precisely know the exact diagnosis here. 10/28/17; 2 week follow-up for this woman with a difficult wound in her left popliteal fossa. This is complicated by lymphedema. She is episodic in using her compression pumps we obtained. We've been using Hydrofera Blue. Dimensions  of the wound are not any different however surface of this looks a lot better than last time. Prior to last visit at which time I had the debridement the wound, dimensions were slightly smaller. The patient came in last time talking to me about disability. I sent her to see her primary physician who I thought would do a proper evaluation for this patient with regards to her disability thoughts. The patient is able to stand and transfer but is no longer ambulatory. She has a wheelchair at home.Marland Kitchen She works as a Freight forwarder for [employer]. She can no longer drive. 11/08/17; this is a 2 week follow-up for patient with a difficult wound in the left popliteal fossa complicated by lymphedema. I am not sure about her compliance with the compression pumps we've been using Hydrofera Blue for 2 months now and we have been making decent progress however once again today she arrives with 50% of the overall wound area covered in tightly adherent black/green necrotic tissue. 12/02/17; difficult wound in the left popliteal fossa complicated by severe lymphedema. We've been using silver alginate although the patient ran out of this and she's been using Hydrofera Blue. Last time she is here she had a considerable amount of tightly adherent green necrotic tissue. Post debridement culture showed MSSA, I gave her 7 days worth of Septra DS which seems to have helped 12/24/17; almost palliative follow-up for this difficult area on the left popliteal fossa complicated by severe lymphedema. We've been using silver alginate although the patient tells me she's been using Hydrofera Blue. She does not separate the folds of this wound. She has been using her lymphedema pumps but not every day. 01/20/18 This is a difficult wound in the left popliteal fossa. Intake nurse noted some purulent drainage and necrotic material. She has severe lymphedema including the posterior thigh and the upper calf surrounding this wound and there is continued  leaking edema fluid out of the wound surface. This is not going to heal like this. She states she is being compliant with the lymphedema pumps once sometimes twice a day. However this is not controlling the edema around the wound either in the calf or  the posterior thigh 02/10/18; really not making any progress here. The wound is actually larger. There is no purulent drainage. Culture I did last time was negative/multiple organisms 03/10/18; 1 month follow-up. The wound may be measures slightly smaller. We've been using care max directly on the wound surface. The area definitely looks less moist. She tells me she is also you been using her pumps religiously twice a day. In fact the area actually looks dry today. 04/12/2018; 1 month follow-up. She states after I used silver nitrate on this wound area a month ago that the pain was intolerable and continued all month. This made it too painful for her to use the care of max reliably or to use her compression pumps. She arrives in clinic today with her wound probably twice the size. There is no purulent drainage 05/03/2018; the patient has been using her pumps reliably twice a day over the last week but she finds the combination of the pump and a dressing on the wound to be unduly painful therefore there is not been a dressing in the last week. She arrives in clinic today with the wound continuing to enlarge. She did not tolerate silver nitrate nor Kerramax directly on the wound 05/26/2018; the patient has not been using her pumps. Her wound in the left posterior popliteal fossa continues to expand. We are using silver alginate although the options for primary dressings are limited since she is paying out- of-pocket for this. Culture I did last time which was a PCR culture showed a scattered number of anaerobes low quantities. I did not think this was significant. 06/09/2018 the patient has not been using her pumps. She puts care of max in the area on  the posterior popliteal fossa. She has no one to help her clean this area. Home health is apparently coming by once a week. For some reason she has to purchase a lot of her supplies herself online. She does not have a lot of financial resources. She states the pumps hurt her and caused the wound to bleed. She is continuing to deteriorate Biopsy I did last visit did not show malignancy/fungus or atypical Mycobacterium. This is a second time I have biopsied this 06/30/2018; the patient states she is using her pumps at least once a day. She put silver alginate and a rolled towel in the area. She occasionally uses kerramax and/or ABD pads. She is decided that a application of silver nitrate I did because of very friable mucosa on this sometime in September is the reason that she could not use the pumps. I did not actually research this. This is clearly not the case 1/30; the patient states she is using her pumps once a day but sometimes for as long as 2 hours. I told her that she still needs to do this and additional time a day. She is using silver alginate. She states the [facility] hurts when she uses her pumps therefore she is not using this. She has ABD pads that she is getting from home health. Dimensions perhaps slightly improved 3/5; the patient uses her pumps once a day but she states for is sometimes as long as 2 hours. She has not managed to do this twice a day. She is using silver alginate. Separating these areas with ABDs and rolled towels. There is no change in dimensions of this wound if anything slightly larger although the surfaces look better 4/2; I do not see too much difference 1 way or the other in  the area on the left popliteal fossa. She is not using her pumps. She did describe an episode of pain and odor a week or 2 ago however she said it is mostly subsided. Still using silver alginate. 5/8; monthly follow-up for this large area in the left popliteal fossa. She is not using her  compression pumps there is surrounding lymphedema from tissue in the posterior thigh in the posterior calf. Her home health nurse had called 2 weeks ago to report black drainage on the surface, she used silver alginate after that and it seems to have cleared up. 6/4; Patient is not using pumps. Finds silver alginate painful. I think she is just using ABD's in the area. She has a new wound laterally to the original wound. Leaking copious lymphedema 7/9; monthly follow-up. Patient is using her pumps about once a week finds it painful. She uses some combination of silver calcium alginate. She has to buy her own supplies [place] and she uses an ABD. She has home health but her co-pay is such that it is cheaper for her to buy her supplies online 8/6-Patient is here for her monthly follow-up, she has started using her pumps again, apparently a couple of weeks ago she had a lot of pain and could not use the pumps, she is now using silver alginate, she states her left posterior calf wound had some drainage that was greenish now converted to serous 10/13; 46-month follow-up. The patient's wound is on the left popliteal fossa. Fairly substantial area in the middle of 2 pannus folds the lower part of her thigh and the upper part of her calf. She has been using silver alginate. As usual she presents with greenish drainage but the wound does not look infected 12/8; 16-month follow-up. The patient now has 2 wounds in the popliteal fossa on the left. Fairly substantial area in the middle of 2 pannus folds in the lower part of her thigh and the upper part of her calf. She has been using silver alginate. There is a smaller wound laterally and posteriorly very difficult to see. The patient says she is using her compression pumps 3 times a week. She is not willing to separate the folds in this wound area. I am not sure I really understand the issue. 2/23; 34-month follow-up. I did not see a second wound this time.  But otherwise I think the area on her left popliteal fossa is unchanged. The wound is between 2 fairly substantial pannus fold in the lower part of her thigh and the upper part of her calf. She has been using silver alginate which she buys online herself. She asks me about different dressings suggested by home health. She is not using her compression pumps and she does not keep these pannus fold separated. 4/20; I follow this woman palliatively on a 62-month basis. She has a chronic wound on the left popliteal fossa. This has been there for 4 years between 2 fairly substantial pannus folds on the lower part of her thigh and the upper part of her calf this drains weeping edema fluid. I biopsied this area on 2 different occasions there was no malignancy. She has external compression pumps but probably uses them maximum of 4 times a week. She is technically homebound but able to transfer herself from bed to wheelchair etc. We have been using silver alginate for a long period of time Our intake nurses reported a greenish drainage Electronic Signature(s) Signed: 10/10/2019 5:47:38 PM By: Linton Ham  MD Entered By: Linton Ham on 10/10/2019 15:52:41 -------------------------------------------------------------------------------- Physical Exam Details Patient Name: Date of Service: ALEXISMARIE, HUNNEL 10/10/2019 2:30 PM Medical Record [id number] Patient Account Number: [id number] Date of Birth/Sex: Treating RN: 11/24/1965 (54 y.o. [name] Primary Care Provider: Fanny Bien Other Clinician: Referring Provider: Treating Provider/Extender:Timara Loma, Gennette Pac, Rojelio Brenner in Treatment: 157 Notes Wound exam; substantial area in the left popliteal fossa. I do not think this is much change in the major way. I did not see any satellite wounds. Weeping lymphedema fluid coming through the surface of the wound. I did not see any evidence of infection however given the  greenish drainage I did a swab culture. Electronic Signature(s) Signed: 10/10/2019 5:47:38 PM By: Linton Ham MD Entered By: Linton Ham on 10/10/2019 15:53:26 -------------------------------------------------------------------------------- Physician Orders Details Patient Name: Date of Service: Danna Hefty 10/10/2019 2:30 PM Medical Record [id number] Patient Account Number: [id number] Date of Birth/Sex: Treating RN: 06/03/1966 (54 y.o. [name] Primary Care Provider: Fanny Bien Other Clinician: Referring Provider: Treating Provider/Extender:Quame Spratlin, Gennette Pac, Rojelio Brenner in Treatment: [phone] Verbal / Phone Orders: No Diagnosis Coding ICD-10 Coding Code Description I87.322 Chronic venous hypertension (idiopathic) with inflammation of left lower extremity I89.0 Lymphedema, not elsewhere classified L97.221 Non-pressure chronic ulcer of left calf limited to breakdown of skin E11.622 Type 2 diabetes mellitus with other skin ulcer E66.09 Other obesity due to excess calories Follow-up Appointments Other: - 2 months Dressing Change Frequency Wound #1 Left,Posterior Knee Change dressing every day. Wound Cleansing May shower and wash wound with soap and water. - also cleanse with Vashe solution Primary Wound Dressing Wound #1 Left,Posterior Knee Calcium Alginate with Silver Secondary Dressing Wound #1 Left,Posterior Knee Kerlix/Rolled Gauze ABD pad - or incontinence pads secure with transpore tape Edema Control Avoid standing for long periods of time Elevate legs to the level of the heart or above for 30 minutes daily and/or when sitting, a frequency of: - throughout the day Exercise regularly Segmental Compressive Device. - lymphedema pumps 60 minutes 2 times per day! Off-Loading Other: - place rolled towel or washcloth in skin fold behind left knee to separate skin in fold Additional Orders / Instructions Follow Nutritious Diet -  increase protein and vegetables to aid in wound healing. [place] skilled nursing for wound care. - [place]. 3 times per week; patient is homebound. Laboratory Bacteria identified in Unspecified specimen by Anaerobe culture (MICRO) - non healing wound with green drainage and odor - (ICD10 L97.221 - Non-pressure chronic ulcer of left calf limited to breakdown of skin) LOINC Code: [id number] Convenience Name: Anerobic culture Electronic Signature(s) Signed: 10/10/2019 5:39:56 PM By: Carlene Coria RN Signed: 10/10/2019 5:47:38 PM By: Linton Ham MD Entered By: Carlene Coria on 10/10/2019 15:50:30 -------------------------------------------------------------------------------- Problem List Details Patient Name: Date of Service: [name] 10/10/2019 2:30 PM Medical Record [id number] Patient Account Number: [id number] Date of Birth/Sex: Treating RN: 1966-05-30 (54 y.o. [name] Primary Care Provider: Fanny Bien Other Clinician: Referring Provider: Treating Provider/Extender:Lavaris Sexson, Gennette Pac, Rojelio Brenner in Treatment: [phone] Active Problems ICD-10 Evaluated Encounter Code Description Active Date Today Diagnosis I87.322 Chronic venous hypertension (idiopathic) with 11/05/2016 No Yes inflammation of left lower extremity I89.0 Lymphedema, not elsewhere classified 10/09/2016 No Yes L97.221 Non-pressure chronic ulcer of left calf limited to 11/05/2016 No Yes breakdown of skin E11.622 Type 2 diabetes mellitus with other skin ulcer 10/01/2016 No Yes E66.09 Other obesity due to excess calories 10/01/2016 No  Yes Inactive Problems Resolved Problems Electronic Signature(s) Signed: 10/10/2019 5:47:38 PM By: Linton Ham MD Entered By: Linton Ham on 10/10/2019 15:50:37 -------------------------------------------------------------------------------- Progress Note Details Patient Name: Date of Service: Danna Hefty  10/10/2019 2:30 PM Medical Record [id number] Patient Account Number: [id number] Date of Birth/Sex: Treating RN: [birth year] (54 y.o. [name] Primary Care Provider: Fanny Bien Other Clinician: Referring Provider: Treating Provider/Extender:Kashmere Staffa, Gennette Pac, Rojelio Brenner in Treatment: 157 Subjective History of Present Illness (HPI) The following HPI elements were documented for the patient's wound: Location: Left popliteal fossa Duration: She was aware of the wound October 2017 Context: The development/occurrence is unclear Modifying Factors: Chronic pressure secondary to thigh pannus 10/01/16- She is here for initial evaluation of her left popliteal fossa ulcer. She states that she noticed the area in October 2017. She is unaware of actual causative factor, states she has a history of eczema and uses a brush for bathing. She was being treated by her PCP, using Aquaphor since then, and stopped using last week. She denies ever being on an antibiotic for this ulcer. She states that this drains excessively and soaks through her clothes, she is unable to visualize this area and has a friend that helps with the dressing changes. She was recently diagnosed with diabetes; A1c in December was 9; down to 7.5-8 in February. She is being managed on oral agents. She is a non-smoker. She has never been evaluated for venous reflux or lymphedema. 10/08/16- She is here in follow up regarding her left popliteal fossa pressure ulcer. She states that she did not receive any supplies as ordered. She states that the dressing material that was ordered and placed last week was in place for several days. She states that the odor and drainage is unchanged. She states that her PCP has written for 1 month off work, starting Monday 4/23. The culture obtained last week was mixed skin flora, no identifiable pathogen. 10/15/16; difficult wound in the left popliteal fossa in the setting of  morbid obesity and lymphedema. We have been using silver alginate although it is not really clear to me that this stays on for very long. She changes it once. Covering with AVD pads to try and separate wound from the surrounding pannus. A culture of this area showed both enterococcus and Enterobacter and I have her on a combination of amoxicillin and cipro. Although most Gram positives will be covered by Cipro, a big exception is Enterobacter particularly Enterobacter faecalis which generally requires ampicillin if indeed it is sensitive. A final issue is that the patient is limited by the co-pay on her insurance with regards to actual wound care supplies. I didn't get into this in much detail although he crossed my mind that we may need to bring her back more than once a week for dressing changes. 10/22/16; difficult wound in the left popliteal fossa. I think this is a lymphedema type issue in the middle lobe folds in the back of her knee. We have been using silver alginate, ABD pads. Dimensions today are improved, base of the wound is improved. I had some thoughts about biopsying this however overall this is improved today and all put that off for now 10/29/16; difficult wound in the left popliteal fossa which I think is a pressure wound from associated large folds of lymphedema. We have been using silver alginate. 11/05/16; difficult wound in the left popliteal fossa. Using silver alginate change 3x/wk. Wound measuring slightly smaller 11/12/16; difficult wound in the setting  of chronic lymphedema. Left popliteal fossa. Using so over alginate change 3 times a week, once here and wants by home health and/or the patient's friend who is a Marine scientist. The dimensions of this appear to be improving, the surface is certainly improved [id number]; Patient now has lymphedema pumps. wound is slightly reduced in width. Still using silver alginate 11/26/16; patient has lymphedema pumps although she is having trouble with  them in terms of fit. Nevertheless she tells me she is using them. She is still using silver alginate as the primary dressing 12/03/16; patient has lymphedema pumps and there is been significant improvement in the swelling in the leg. We've been using silver alginate but really only making minimal improvements if any in the overall wound area although the base of the wound is a lot better than when she first came here. 12/10/16; patient has increasing pain, drainage and odor this week. States this began to get worse over the weekend. She is been using her pumps at home and the edema is certainly better in the leg. We changed to [facility] Blue last week. She has home health coming out once a week to change the dressing 12/17/16; culture I did last week showed a few MSSA. We called her in cephalexin at 500 4 times a day. She admits to less than 100% compliance with the 4 times a day dosing. I have encouraged her today. The surrounding tenderness and erythema in this wound is a lot better. Nursing reports that the diameter of the wound is down a centimeter. Healthy-looking wound base no surrounding tenderness. We continue to use silver alginate 12/24/16; she is still completing her antibiotics apparently not taking them exactly as prescribed. The wound is perhaps marginally smaller. Surface of this continues to look not 100% viable. We have been using silver collagen. Consider changing to Iodoflex 12/31/16; she is finished her antibiotics. There is no evidence to suspect ongoing infection in the wound. We have looked back over the pictures of the wound in the left popliteal fossa. She came in with a thick adherent necrotic eschar and now we are dealing with slight surface slough. No debridement will be planned today. I'm going to try [facility] in lieu of the silver alginate starting today 01/07/17; patient continues to do well using Hydrofera Blue. Reliably using her external compression pumps twice  a day 01/15/17; the patient has been using [facility] with a general improvement in her wound condition and size. However she went back to work yesterday it would seem that her use of the compression pumps this week is been intermittent and at work she is unable to change the dressings. Per our intake nurse arrived with a macerated dressing in place. 01/22/17 on evaluation today patient appears to be doing a little bit worse. She is having more discomfort and there's a erythema surrounding the wound bed behind her left knee. She states this discomfort often is associated with infection and in fact this wound looks to likely be infected. She has no fevers, chills, nausea, vomiting, or diarrhea. 02/12/17; the patient's wound is not as good as I thought I would find it. She was making a good improvement when I saw her a month ago. She went back to work and largely she blames difficulties at work for this. Apparently during my absence she was treated with oral antibiotics starting on 8/3. I don't believe there are any cultures 02/19/17; patient arrives in clinic today with the wound is essentially unchanged in terms  of dimensions and wound appearance. We have been using silver alginate. She claims compliance with the compression pumps twice a day 02/25/17; no major change in the condition of the wound. We used Iodo flex last week with the purpose of trying to get a better-looking surface. She could not tolerate this because of discomfort. She found this especially bad when she used her compression pumps. Before this we use silver alginate again with not a lot of success. I changed her to [employer] which will certainly not have much debridement activity however I will probably change to weekly mechanical debridements. This will help with the drainage. She is now at home/on short-term disability. 03/11/17; the patient is now on disability. She is been using PolyMem AG wound for the first time looks a  little better this week and dimensions have improved. She is compliant with her external compression pumps 03/18/16; the patient is now on disability at home using her palms. We've been using PolyMen AG. Wound certainly looks smaller. Arrives today with an adherent surface requiring debridement. 04/08/17; patient has not been here in 3 weeks. Using Anasept with PolyMem AG. She arrives today with an adherent surface over the wound. She did not wish debridement, there was edema fluid dripping through the wound surface even though she states she is using her external compression pumps 04/15/17; patient is using Anasept with PolyMem and AG. She arrives today telling us that her leg is been hurting since a home care nurse staff her dressing into her on Sunday therefore she could not use her compression pumps in spite of this her dimensions appear to be better although this is a very difficult wound to measure in the popliteal fossa of the left knee 04/22/17; arrives today in clinic using Anasept and PolyMem AG. She is using her compression pumps per her description. The wound surface however once again is not going to allow healing. She still has weeping edema fluid coming through the wound. No major change in dimensions 04/29/17; arrives today with the major deterioration in the wound both in terms of dimensions and condition of the wound surface. She reports increasing pain since earlier in the week at our intake nurse reports purulent drainage on the ABD pads.this is not going in the right direction. We put Hydrofera Blue on this last week but I'm going to change back to [employer] because of the drainage. I reviewed previous cultures last done in June showed methicillin sensitive staph aureus. We had been making progress with this wound presenting initially with a necrotic wound on the popliteal fossa on the right. We managed to get a viable surface and some reduction in wound dimensions however in  the past 4 weeks this is been getting gradually worse and today much worse in terms of both size and condition of the wound surface. The patient is having pain and will require empiric antibiotics because of the purulent drainage and deterioration in the wound 05/20/17; I haven't seen this patient in 3 weeks. Biopsy of this wound I did last time suggested extensive granulation tissue with collections of neutrophils lymphocytes plasma cells histiocytes and multinucleated giant cells. There was no evidence of malignancy. Deep tissue culture I did showed MSSA and I gave her Keflex when she was here last time and renewed this for a week however she is still taking the medication I'm not certain she actually took it properly.the pathologist suggested the possibility of pyoderma gangrenosum, a vasculopathy. PAS stains and acid-fast stains were  negative She also tells me that she is having mobility problems secondary to pain in both knees. She uses a walker but can barely make it to the bathroom. It sounds as though she has a history of chronic osteoarthritis and she tells me that she had a left anterior cruciate ligament tear at some time in the past 06/10/17; this is a patient that I haven't seen in 3 weeks. She is completed her antibiotics. Biopsy suggested the possibility of infection versus pyoderma gangrenosum. I gave her 2 weeks of Keflex directed at [facility] she is repeated this.she called last week to request more antibiotics but I wanted to see her. She arrives today with her aunt. Apparently the patient has deteriorated in terms of function. According the patient and her aunt she can no longer ambulate or easily transfer. She is incontinent because she has urge incontinence etc. They requested admission to a rehabilitation facility or the hospital and then a rehabilitation facility. I was not aware that the patient was declining in terms a shin but according to our staff that's been the case they  haven't seen her walk in the him any months that she's been here. I wasn't really aware of this. In fact the patient was working up until the end of August. In any case she arrives with the wound a lot worse 07/01/17; this is a patient I haven't seen in 3 weeks. The last time she was here at the request of her family member we sent her to the ER. She apparently was kept overnight. She was treated for UTI. Attempts were made to find her long-term care although there was insurance and cost issues and she is back home. She now has her aunt living with her. I am not clear what they are using to the wound on the posterior left knee/popliteal fossa. Last biopsy I did of this area did not document pyoderma which had been raised by another/previous biopsy. We are not clear today what they're actually putting on the wound 07/15/17; she arrives today with improvement in her wound dimensions although there is still weeping edema fluid. She claims she is changing the dressing [silver alginate] twice daily. I've suggested increasing this to 3 times a day. She is changing the silver alginate twice a day 07/29/17; patient complains of pain drainage and odor coming from the wound which was different from 2 weeks ago.. We have been using silver alginate she has been compliant with her compression pumps 09/02/17; the patient continues to complain of pain and drainage. I gave her empiric antibiotics when she was last here 5 weeks ago but she says she only completed them recently. She seems confused about the time frame of her last visit here.she states she was concerned about an odor and stopped using the [facility] Blue on the wound in her left popliteal fossa about a week ago. I don't think she is dressing this with anything since. She is using her lymphedema pumps once a day.previous biopsy of done of this area showed inflammation associated with stasis dermatitis or trauma She is still not walking apparently because  of severe osteoarthritis in both knees. She thinks she'll be going back to work in June and she needs to be ambulatory although I really can't see this happening. 09/16/17; the patient is been using a mixture of Hydrofera Blue and PolyMen that she had left over. I'm not really sure how reliable this is. She tells me she is using her external compression pumps once  a day. She had a steroid shot in her left knee this morning 09/30/17; 2 week follow-up for this patient with a difficult wound behind her left popliteal fossa. She has home health. They're using Hydrofera Blue. Measurement slightly smaller. She states she is using her compression pumps once a day 10/14/17; 2 week follow-up for this woman with a difficult wound with find her left popliteal fossa. She has home health. Using Hydrofera Blue. Measurements again today slightly smaller. Most of the surface of this covered in a nonviable tightly green adherent material. Some of this may be retained Hydrofera Blue nevertheless was debrided with great difficulty. She asked me about her upcoming disability which I think happens in June. I told her that from my point of view the wound itself does not qualify her to be disabled by itself. Now I'm aware that she can no longer stand and walk. This really deteriorated quite a bit during the course of the time she is been here. I suspect this is due to severe osteoarthritis of both knees and she is seen orthopedics for this. I think this is a source of her disability but I don't really feel comfortable with this given the fact that I don't precisely know the exact diagnosis here. 10/28/17; 2 week follow-up for this woman with a difficult wound in her left popliteal fossa. This is complicated by lymphedema. She is episodic in using her compression pumps we obtained. We've been using Hydrofera Blue. Dimensions of the wound are not any different however surface of this looks a lot better than last time. Prior to  last visit at which time I had the debridement the wound, dimensions were slightly smaller. The patient came in last time talking to me about disability. I sent her to see her primary physician who I thought would do a proper evaluation for this patient with regards to her disability thoughts. The patient is able to stand and transfer but is no longer ambulatory. She has a wheelchair at home.Marland Kitchen She works as a Freight forwarder for [employer]. She can no longer drive. 11/08/17; this is a 2 week follow-up for patient with a difficult wound in the left popliteal fossa complicated by lymphedema. I am not sure about her compliance with the compression pumps we've been using Hydrofera Blue for 2 months now and we have been making decent progress however once again today she arrives with 50% of the overall wound area covered in tightly adherent black/green necrotic tissue. 12/02/17; difficult wound in the left popliteal fossa complicated by severe lymphedema. We've been using silver alginate although the patient ran out of this and she's been using Hydrofera Blue. Last time she is here she had a considerable amount of tightly adherent green necrotic tissue. Post debridement culture showed MSSA, I gave her 7 days worth of Septra DS which seems to have helped 12/24/17; almost palliative follow-up for this difficult area on the left popliteal fossa complicated by severe lymphedema. We've been using silver alginate although the patient tells me she's been using Hydrofera Blue. She does not separate the folds of this wound. She has been using her lymphedema pumps but not every day. 01/20/18 This is a difficult wound in the left popliteal fossa. Intake nurse noted some purulent drainage and necrotic material. She has severe lymphedema including the posterior thigh and the upper calf surrounding this wound and there is continued leaking edema fluid out of the wound surface. This is not going to heal like this. She states she is  being compliant with the lymphedema pumps once sometimes twice a day. However this is not controlling the edema around the wound either in the calf or the posterior thigh 02/10/18; really not making any progress here. The wound is actually larger. There is no purulent drainage. Culture I did last time was negative/multiple organisms 03/10/18; 1 month follow-up. The wound may be measures slightly smaller. We've been using care max directly on the wound surface. The area definitely looks less moist. She tells me she is also you been using her pumps religiously twice a day. In fact the area actually looks dry today. 04/12/2018; 1 month follow-up. She states after I used silver nitrate on this wound area a month ago that the pain was intolerable and continued all month. This made it too painful for her to use the care of max reliably or to use her compression pumps. She arrives in clinic today with her wound probably twice the size. There is no purulent drainage 05/03/2018; the patient has been using her pumps reliably twice a day over the last week but she finds the combination of the pump and a dressing on the wound to be unduly painful therefore there is not been a dressing in the last week. She arrives in clinic today with the wound continuing to enlarge. She did not tolerate silver nitrate nor Kerramax directly on the wound 05/26/2018; the patient has not been using her pumps. Her wound in the left posterior popliteal fossa continues to expand. We are using silver alginate although the options for primary dressings are limited since she is paying out- of-pocket for this. Culture I did last time which was a PCR culture showed a scattered number of anaerobes low quantities. I did not think this was significant. 06/09/2018 the patient has not been using her pumps. She puts care of max in the area on the posterior popliteal fossa. She has no one to help her clean this area. Home health is apparently  coming by once a week. For some reason she has to purchase a lot of her supplies herself online. She does not have a lot of financial resources. She states the pumps hurt her and caused the wound to bleed. She is continuing to deteriorate Biopsy I did last visit did not show malignancy/fungus or atypical Mycobacterium. This is a second time I have biopsied this 06/30/2018; the patient states she is using her pumps at least once a day. She put silver alginate and a rolled towel in the area. She occasionally uses kerramax and/or ABD pads. She is decided that a application of silver nitrate I did because of very friable mucosa on this sometime in September is the reason that she could not use the pumps. I did not actually research this. This is clearly not the case 1/30; the patient states she is using her pumps once a day but sometimes for as long as 2 hours. I told her that she still needs to do this and additional time a day. She is using silver alginate. She states the [facility] hurts when she uses her pumps therefore she is not using this. She has ABD pads that she is getting from home health. Dimensions perhaps slightly improved 3/5; the patient uses her pumps once a day but she states for is sometimes as long as 2 hours. She has not managed to do this twice a day. She is using silver alginate. Separating these areas with ABDs and rolled towels. There is no change in dimensions  of this wound if anything slightly larger although the surfaces look better 4/2; I do not see too much difference 1 way or the other in the area on the left popliteal fossa. She is not using her pumps. She did describe an episode of pain and odor a week or 2 ago however she said it is mostly subsided. Still using silver alginate. 5/8; monthly follow-up for this large area in the left popliteal fossa. She is not using her compression pumps there is surrounding lymphedema from tissue in the posterior thigh in the posterior  calf. Her home health nurse had called 2 weeks ago to report black drainage on the surface, she used silver alginate after that and it seems to have cleared up. 6/4; Patient is not using pumps. Finds silver alginate painful. I think she is just using ABD's in the area. She has a new wound laterally to the original wound. Leaking copious lymphedema 7/9; monthly follow-up. Patient is using her pumps about once a week finds it painful. She uses some combination of silver calcium alginate. She has to buy her own supplies [place] and she uses an ABD. She has home health but her co-pay is such that it is cheaper for her to buy her supplies online 8/6-Patient is here for her monthly follow-up, she has started using her pumps again, apparently a couple of weeks ago she had a lot of pain and could not use the pumps, she is now using silver alginate, she states her left posterior calf wound had some drainage that was greenish now converted to serous 10/13; 57-month follow-up. The patient's wound is on the left popliteal fossa. Fairly substantial area in the middle of 2 pannus folds the lower part of her thigh and the upper part of her calf. She has been using silver alginate. As usual she presents with greenish drainage but the wound does not look infected 12/8; 81-month follow-up. The patient now has 2 wounds in the popliteal fossa on the left. Fairly substantial area in the middle of 2 pannus folds in the lower part of her thigh and the upper part of her calf. She has been using silver alginate. There is a smaller wound laterally and posteriorly very difficult to see. The patient says she is using her compression pumps 3 times a week. She is not willing to separate the folds in this wound area. I am not sure I really understand the issue. 2/23; 56-month follow-up. I did not see a second wound this time. But otherwise I think the area on her left popliteal fossa is unchanged. The wound is between 2 fairly  substantial pannus fold in the lower part of her thigh and the upper part of her calf. She has been using silver alginate which she buys online herself. She asks me about different dressings suggested by home health. She is not using her compression pumps and she does not keep these pannus fold separated. 4/20; I follow this woman palliatively on a 59-month basis. She has a chronic wound on the left popliteal fossa. This has been there for 4 years between 2 fairly substantial pannus folds on the lower part of her thigh and the upper part of her calf this drains weeping edema fluid. I biopsied this area on 2 different occasions there was no malignancy. She has external compression pumps but probably uses them maximum of 4 times a week. She is technically homebound but able to transfer herself from bed to wheelchair etc. We have  been using silver alginate for a long period of time Our intake nurses reported a greenish drainage Objective Constitutional Vitals Time Taken: 2:58 PM, Height: 70 in, Source: Stated, Weight: 484 lbs, Source: Stated, BMI: 69.4, Temperature: 98.9 F, Pulse: 112 bpm, Respiratory Rate: 20 breaths/min, Blood Pressure: 130/72 mmHg. Integumentary (Hair, Skin) Wound #1 status is Open. Original cause of wound was Gradually Appeared. The wound is located on the Left,Posterior Knee. The wound measures 7cm length x 9.5cm width x 0.2cm depth; 52.229cm^2 area and 10.446cm^3 volume. There is Fat Layer (Subcutaneous Tissue) Exposed exposed. There is no tunneling or undermining noted. There is a large amount of serosanguineous drainage noted. The wound margin is flat and intact. There is large (67-100%) **red, friable granulation within the wound bed. There is a small (1-33%) amount of necrotic tissue within the wound bed including Adherent Slough. Wound #3 status is Healed - Epithelialized. Original cause of wound was Gradually Appeared. The wound is located on the Left,Medial Lower  Leg. The wound measures 0cm length x 0cm width x 0cm depth; 0cm^2 area and 0cm^3 volume. There is no tunneling or undermining noted. There is a none present amount of drainage noted. The wound margin is thickened. There is no granulation within the wound bed. There is no necrotic tissue within the wound bed. Assessment Active Problems ICD-10 Chronic venous hypertension (idiopathic) with inflammation of left lower extremity Lymphedema, not elsewhere classified Non-pressure chronic ulcer of left calf limited to breakdown of skin Type 2 diabetes mellitus with other skin ulcer Other obesity due to excess calories Plan Follow-up Appointments: Other: - 2 months Dressing Change Frequency: Wound #1 Left,Posterior Knee: Change dressing every day. Wound Cleansing: May shower and wash wound with soap and water. - also cleanse with Vashe solution Primary Wound Dressing: Wound #1 Left,Posterior Knee: Calcium Alginate with Silver Secondary Dressing: Wound #1 Left,Posterior Knee: Kerlix/Rolled Gauze ABD pad - or incontinence pads secure with transpore tape Edema Control: Avoid standing for long periods of time Elevate legs to the level of the heart or above for 30 minutes daily and/or when sitting, a frequency of: - throughout the day Exercise regularly Segmental Compressive Device. - lymphedema pumps 60 minutes 2 times per day! Off-Loading: Other: - place rolled towel or washcloth in skin fold behind left knee to separate skin in fold Additional Orders / Instructions: Follow Nutritious Diet - increase protein and vegetables to aid in wound healing. Home Health: [facility] for [facility] care. - [place]. 3 times per week; patient is homebound. Laboratory ordered were: Anerobic culture, wound left post leg - non healing wound with green drainage and odor 1. At this point I do not think there is anything different to be done here. We are using silver alginate and  ABD pads. 2. I have asked her to separate these falls using a hand towel she simply will not do it 3. Compliance with compression pumps is always been fairly minimal #4 I biopsied this on 2 different occasions I do not really see the need to do this again 5. Culture for CandS but no empiric antibiotics Electronic Signature(s) Signed: 10/10/2019 5:47:38 PM By: Linton Ham MD Entered By: Linton Ham on 10/10/2019 15:55:13 -------------------------------------------------------------------------------- SuperBill Details Patient Name: Date of Service: Danna Hefty 10/10/2019 Medical Record [id number] Patient Account Number: [id number] Date of Birth/Sex: Treating RN: 05-20-1966 (54 y.o. [name] Primary Care Provider: Fanny Bien Other Clinician: Referring Provider: Treating Provider/Extender:Johncarlos Holtsclaw, Gennette Pac, Rojelio Brenner in  Treatment: 157 Diagnosis Coding ICD-10 Codes Code Description I87.322 Chronic venous hypertension (idiopathic) with inflammation of left lower extremity I89.0 Lymphedema, not elsewhere classified L97.221 Non-pressure chronic ulcer of left calf limited to breakdown of skin E11.622 Type 2 diabetes mellitus with other skin ulcer E66.09 Other obesity due to excess calories Facility Procedures CPT4 Code: [id number] Description: 99213 - WOUND CARE VISIT-LEV 3 EST PT Modifier: Quantity: 1 Physician Procedures CPT4 Code Description: [id number] - WC PHYS LEVEL 3 - EST PT ICD-10 Diagnosis Description I87.322 Chronic venous hypertension (idiopathic) with inflammation extremity I89.0 Lymphedema, not elsewhere classified L97.221 Non-pressure chronic ulcer of  left calf limited to breakdo Modifier: of left lower wn of skin Quantity: 1 Electronic Signature(s) Signed: 10/10/2019 5:39:56 PM By: Carlene Coria RN Signed: 10/10/2019 5:47:38 PM By: Linton Ham MD Entered By: Carlene Coria on 10/10/2019 15:55:46

## 2019-10-12 LAB — AEROBIC CULTURE  (SUPERFICIAL SPECIMEN)

## 2019-10-15 LAB — AEROBIC CULTURE W GRAM STAIN (SUPERFICIAL SPECIMEN): Gram Stain: NONE SEEN

## 2019-10-15 LAB — AEROBIC CULTURE? (SUPERFICIAL SPECIMEN)

## 2019-11-07 ENCOUNTER — Ambulatory Visit (HOSPITAL_BASED_OUTPATIENT_CLINIC_OR_DEPARTMENT_OTHER): Payer: BLUE CROSS/BLUE SHIELD | Admitting: Internal Medicine

## 2019-11-09 ENCOUNTER — Encounter (HOSPITAL_BASED_OUTPATIENT_CLINIC_OR_DEPARTMENT_OTHER): Payer: Medicare PPO | Admitting: Internal Medicine

## 2019-12-05 ENCOUNTER — Encounter (HOSPITAL_BASED_OUTPATIENT_CLINIC_OR_DEPARTMENT_OTHER): Payer: Medicare PPO | Attending: Internal Medicine | Admitting: Internal Medicine

## 2019-12-05 ENCOUNTER — Other Ambulatory Visit: Payer: Self-pay

## 2019-12-05 DIAGNOSIS — Z6841 Body Mass Index (BMI) 40.0 and over, adult: Secondary | ICD-10-CM | POA: Diagnosis not present

## 2019-12-05 DIAGNOSIS — I890 Lymphedema, not elsewhere classified: Secondary | ICD-10-CM | POA: Diagnosis not present

## 2019-12-05 DIAGNOSIS — I87322 Chronic venous hypertension (idiopathic) with inflammation of left lower extremity: Secondary | ICD-10-CM | POA: Diagnosis not present

## 2019-12-05 DIAGNOSIS — E11622 Type 2 diabetes mellitus with other skin ulcer: Secondary | ICD-10-CM | POA: Insufficient documentation

## 2019-12-05 DIAGNOSIS — L97822 Non-pressure chronic ulcer of other part of left lower leg with fat layer exposed: Secondary | ICD-10-CM | POA: Diagnosis not present

## 2019-12-06 NOTE — Progress Notes (Signed)
[name], [name] ([id number]) . Visit Report for 12/05/2019 HPI Details Patient Name: Date of Service: [name], [name] 12/05/2019 2:30 PM Medical Record Number: [id number] Patient Account Number: [id number] Date of Birth/Sex: Treating RN: 1965/09/23 (54 y.o. Nancy Fetter Primary Care Provider: Fanny Bien Other Clinician: Referring Provider: Treating Provider/Extender: Scheryl Marten in Treatment: 165 History of Present Illness Location: Left popliteal fossa Duration: She was aware of the wound October 2017 Context: The development/occurrence is unclear Modifying Factors: Chronic pressure secondary to thigh pannus HPI Description: 10/01/16- She is here for initial evaluation of her left popliteal fossa ulcer. She states that she noticed the area in October 2017. She is unaware of actual causative factor, states she has a history of eczema and uses a brush for bathing. She was being treated by her PCP, using Aquaphor since then, and stopped using last week. She denies ever being on an antibiotic for this ulcer. She states that this drains excessively and soaks through her clothes, she is unable to visualize this area and has a friend that helps with the dressing changes. She was recently diagnosed with diabetes; A1c in December was 9; down to 7.5-8 in February. She is being managed on oral agents. She is a non-smoker. She has never been evaluated for venous reflux or lymphedema. 10/08/16- She is here in follow up regarding her left popliteal fossa pressure ulcer. She states that she did not receive any supplies as ordered. She states that the dressing material that was ordered and placed last week was in place for several days. She states that the odor and drainage is unchanged. She states that her PCP has written for 1 month off work, starting Monday 4/23. The culture obtained last week was mixed skin flora, no identifiable pathogen. 10/15/16;  difficult wound in the left popliteal fossa in the setting of morbid obesity and lymphedema. We have been using silver alginate although it is not really clear to me that this stays on for very long. She changes it once. Covering with AVD pads to try and separate wound from the surrounding pannus. A culture of this area showed both enterococcus and Enterobacter and I have her on a combination of amoxicillin and cipro. Although most Gram positives will be covered by Cipro, a big exception is Enterobacter particularly Enterobacter faecalis which generally requires ampicillin if indeed it is sensitive. A final issue is that the patient is limited by the co-pay on her insurance with regards to actual wound care supplies. I didn'Ware get into this in much detail although he crossed my mind that we may need to bring her back more than once a week for dressing changes. 10/22/16; difficult wound in the left popliteal fossa. I think this is a lymphedema type issue in the middle lobe folds in the back of her knee. We have been using silver alginate, ABD pads. Dimensions today are improved, base of the wound is improved. I had some thoughts about biopsying this however overall this is improved today and all put that off for now 10/29/16; difficult wound in the left popliteal fossa which I think is a pressure wound from associated large folds of lymphedema. We have been using silver alginate. 11/05/16; difficult wound in the left popliteal fossa. Using silver alginate change 3x/wk. Wound measuring slightly smaller 11/12/16; difficult wound in the setting of chronic lymphedema. Left popliteal fossa. Using so over alginate change 3 times a week, once here and wants by home health and/or  the patient's friend who is a Marine scientist. The dimensions of this appear to be improving, the surface is certainly improved 0/93/26; Patient now has lymphedema pumps. wound is slightly reduced in width. Still using silver alginate 11/26/16; patient  has lymphedema pumps although she is having trouble with them in terms of fit. Nevertheless she tells me she is using them. She is still using silver alginate as the primary dressing 12/03/16; patient has lymphedema pumps and there is been significant improvement in the swelling in the leg. We've been using silver alginate but really only making minimal improvements if any in the overall wound area although the base of the wound is a lot better than when she first came here. 12/10/16; patient has increasing pain, drainage and odor this week. States this began to get worse over the weekend. She is been using her pumps at home and the edema is certainly better in the leg. We changed to [facility] Blue last week. She has home health coming out once a week to change the dressing 12/17/16; culture I did last week showed a few MSSA. We called her in cephalexin at 500 4 times a day. She admits to less than 100% compliance with the 4 times a day dosing. I have encouraged her today. The surrounding tenderness and erythema in this wound is a lot better. Nursing reports that the diameter of the wound is down a centimeter. Healthy-looking wound base no surrounding tenderness. We continue to use silver alginate 12/24/16; she is still completing her antibiotics apparently not taking them exactly as prescribed. The wound is perhaps marginally smaller. Surface of this continues to look not 100% viable. We have been using silver collagen. Consider changing to Iodoflex 12/31/16; she is finished her antibiotics. There is no evidence to suspect ongoing infection in the wound. We have looked back over the pictures of the wound in the left popliteal fossa. She came in with a thick adherent necrotic eschar and now we are dealing with slight surface slough. No debridement will be planned today. I'm going to try [facility] in lieu of the silver alginate starting today 01/07/17; patient continues to do well using Hydrofera Blue.  Reliably using her external compression pumps twice a day 01/15/17; the patient has been using [facility] with a general improvement in her wound condition and size. However she went back to work yesterday it would seem that her use of the compression pumps this week is been intermittent and at work she is unable to change the dressings. Per our intake nurse arrived with a macerated dressing in place. 01/22/17 on evaluation today patient appears to be doing a little bit worse. She is having more discomfort and there's a erythema surrounding the wound bed behind her left knee. She states this discomfort often is associated with infection and in fact this wound looks to likely be infected. She has no fevers, chills, nausea, vomiting, or diarrhea. 02/12/17; the patient's wound is not as good as I thought I would find it. She was making a good improvement when I saw her a month ago. She went back to work and largely she blames difficulties at work for this. Apparently during my absence she was treated with oral antibiotics starting on 8/3. I don'Ware believe there are any cultures 02/19/17; patient arrives in clinic today with the wound is essentially unchanged in terms of dimensions and wound appearance. We have been using silver alginate. She claims compliance with the compression pumps twice a day 02/25/17; no  major change in the condition of the wound. We used Iodo flex last week with the purpose of trying to get a better-looking surface. She could not tolerate this because of discomfort. She found this especially bad when she used her compression pumps. Before this we use silver alginate again with not a lot of success. I changed her to [employer] which will certainly not have much debridement activity however I will probably change to weekly mechanical debridements. This will help with the drainage. She is now at home/on short-term disability. 03/11/17; the patient is now on disability. She is been using  PolyMem AG wound for the first time looks a little better this week and dimensions have improved. She is compliant with her external compression pumps 03/18/16; the patient is now on disability at home using her palms. We've been using PolyMen AG. Wound certainly looks smaller. Arrives today with an adherent surface requiring debridement. 04/08/17; patient has not been here in 3 weeks. Using Anasept with PolyMem AG. She arrives today with an adherent surface over the wound. She did not wish debridement, there was edema fluid dripping through the wound surface even though she states she is using her external compression pumps 04/15/17; patient is using Anasept with PolyMem and AG. She arrives today telling us that her leg is been hurting since a home care nurse staff her dressing into her on Sunday therefore she could not use her compression pumps in spite of this her dimensions appear to be better although this is a very difficult wound to measure in the popliteal fossa of the left knee 04/22/17; arrives today in clinic using Anasept and PolyMem AG. She is using her compression pumps per her description. The wound surface however once again is not going to allow healing. She still has weeping edema fluid coming through the wound. No major change in dimensions 04/29/17; arrives today with the major deterioration in the wound both in terms of dimensions and condition of the wound surface. She reports increasing pain since earlier in the week at our intake nurse reports purulent drainage on the ABD pads.this is not going in the right direction. We put Hydrofera Blue on this last week but I'm going to change back to [employer] because of the drainage. I reviewed previous cultures last done in June showed methicillin sensitive staph aureus. We had been making progress with this wound presenting initially with a necrotic wound on the popliteal fossa on the right. We managed to get a viable surface and some  reduction in wound dimensions however in the past 4 weeks this is been getting gradually worse and today much worse in terms of both size and condition of the wound surface. The patient is having pain and will require empiric antibiotics because of the purulent drainage and deterioration in the wound 05/20/17; I haven'Ware seen this patient in 3 weeks. Biopsy of this wound I did last time suggested extensive granulation tissue with collections of neutrophils lymphocytes plasma cells histiocytes and multinucleated giant cells. There was no evidence of malignancy. Deep tissue culture I did showed MSSA and I gave her Keflex when she was here last time and renewed this for a week however she is still taking the medication I'm not certain she actually took it properly.the pathologist suggested the possibility of pyoderma gangrenosum, a vasculopathy. PAS stains and acid-fast stains were negative She also tells me that she is having mobility problems secondary to pain in both knees. She uses a walker but can  barely make it to the bathroom. It sounds as though she has a history of chronic osteoarthritis and she tells me that she had a left anterior cruciate ligament tear at some time in the past 06/10/17; this is a patient that I haven'Ware seen in 3 weeks. She is completed her antibiotics. Biopsy suggested the possibility of infection versus pyoderma gangrenosum. I gave her 2 weeks of Keflex directed at [facility] she is repeated this.she called last week to request more antibiotics but I wanted to see her. She arrives today with her aunt. Apparently the patient has deteriorated in terms of function. According the patient and her aunt she can no longer ambulate or easily transfer. She is incontinent because she has urge incontinence etc. They requested admission to a rehabilitation facility or the hospital and then a rehabilitation facility. I was not aware that the patient was declining in terms a shin but according to  our staff that's been the case they haven'Ware seen her walk in the him any months that she's been here. I wasn'Ware really aware of this. In fact the patient was working up until the end of August. In any case she arrives with the wound a lot worse 07/01/17; this is a patient I haven'Ware seen in 3 weeks. The last time she was here at the request of her family member we sent her to the ER. She apparently was kept overnight. She was treated for UTI. Attempts were made to find her long-term care although there was insurance and cost issues and she is back home. She now has her aunt living with her. I am not clear what they are using to the wound on the posterior left knee/popliteal fossa. Last biopsy I did of this area did not document pyoderma which had been raised by another/previous biopsy. We are not clear today what they're actually putting on the wound 07/15/17; she arrives today with improvement in her wound dimensions although there is still weeping edema fluid. She claims she is changing the dressing [silver alginate] twice daily. I've suggested increasing this to 3 times a day. She is changing the silver alginate twice a day 07/29/17; patient complains of pain drainage and odor coming from the wound which was different from 2 weeks ago.. We have been using silver alginate she has been compliant with her compression pumps 09/02/17; the patient continues to complain of pain and drainage. I gave her empiric antibiotics when she was last here 5 weeks ago but she says she only completed them recently. She seems confused about the time frame of her last visit here.she states she was concerned about an odor and stopped using the [facility] Blue on the wound in her left popliteal fossa about a week ago. I don'Ware think she is dressing this with anything since. She is using her lymphedema pumps once a day.previous biopsy of done of this area showed inflammation associated with stasis dermatitis or trauma She is still  not walking apparently because of severe osteoarthritis in both knees. She thinks she'll be going back to work in June and she needs to be ambulatory although I really can'Ware see this happening. 09/16/17; the patient is been using a mixture of Hydrofera Blue and PolyMen that she had left over. I'm not really sure how reliable this is. She tells me she is using her external compression pumps once a day. She had a steroid shot in her left knee this morning 09/30/17; 2 week follow-up for this patient with a difficult  wound behind her left popliteal fossa. She has home health. They're using Hydrofera Blue. Measurement slightly smaller. She states she is using her compression pumps once a day 10/14/17; 2 week follow-up for this woman with a difficult wound with find her left popliteal fossa. She has home health. Using Hydrofera Blue. Measurements again today slightly smaller. Most of the surface of this covered in a nonviable tightly green adherent material. Some of this may be retained Hydrofera Blue nevertheless was debrided with great difficulty. She asked me about her upcoming disability which I think happens in June. I told her that from my point of view the wound itself does not qualify her to be disabled by itself. Now I'm aware that she can no longer stand and walk. This really deteriorated quite a bit during the course of the time she is been here. I suspect this is due to severe osteoarthritis of both knees and she is seen orthopedics for this. I think this is a source of her disability but I don'Ware really feel comfortable with this given the fact that I don'Ware precisely know the exact diagnosis here. 10/28/17; 2 week follow-up for this woman with a difficult wound in her left popliteal fossa. This is complicated by lymphedema. She is episodic in using her compression pumps we obtained. We've been using Hydrofera Blue. Dimensions of the wound are not any different however surface of this looks a lot  better than last time. Prior to last visit at which time I had the debridement the wound, dimensions were slightly smaller. The patient came in last time talking to me about disability. I sent her to see her primary physician who I thought would do a proper evaluation for this patient with regards to her disability thoughts. The patient is able to stand and transfer but is no longer ambulatory. 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 . 11/08/17; this is a 2 week follow-up for patient with a difficult wound in the left popliteal fossa complicated by lymphedema. I am not sure about her compliance with the compression pumps we've been using Hydrofera Blue for 2 months now and we have been making decent progress however once again today she arrives with 50% of the overall wound area covered in tightly adherent black/green necrotic tissue. 12/02/17; difficult wound in the left popliteal fossa complicated by severe lymphedema. We've been using silver alginate although the patient ran out of this and she's been using Hydrofera Blue. Last time she is here she had a considerable amount of tightly adherent green necrotic tissue. Post debridement culture showed MSSA, I gave her 7 days worth of Septra DS which seems to have helped 12/24/17; almost palliative follow-up for this difficult area on the left popliteal fossa complicated by severe lymphedema. We've been using silver alginate although the patient tells me she's been using Hydrofera Blue. She does not separate the folds of this wound. She has been using her lymphedema pumps but not every day. 01/20/18 This is a difficult wound in the left popliteal fossa. Intake nurse noted some purulent drainage and necrotic material. She has severe lymphedema including the posterior thigh and the upper calf surrounding this wound and there is continued leaking edema fluid out of the wound surface. This is not going to heal  like this. She states she is being compliant with the lymphedema pumps once sometimes twice a day. However this is not controlling the edema around the wound  either in the calf or the posterior thigh 02/10/18; really not making any progress here. The wound is actually larger. There is no purulent drainage. Culture I did last time was negative/multiple organisms 03/10/18; 1 month follow-up. The wound may be measures slightly smaller. We've been using care max directly on the wound surface. The area definitely looks less moist. She tells me she is also you been using her pumps religiously twice a day. In fact the area actually looks dry today. 04/12/2018; 1 month follow-up. She states after I used silver nitrate on this wound area a month ago that the pain was intolerable and continued all month. This made it too painful for her to use the care of max reliably or to use her compression pumps. She arrives in clinic today with her wound probably twice the size. There is no purulent drainage 05/03/2018; the patient has been using her pumps reliably twice a day over the last week but she finds the combination of the pump and a dressing on the wound to be unduly painful therefore there is not been a dressing in the last week. She arrives in clinic today with the wound continuing to enlarge. She did not tolerate silver nitrate nor Kerramax directly on the wound 05/26/2018; the patient has not been using her pumps. Her wound in the left posterior popliteal fossa continues to expand. We are using silver alginate although the options for primary dressings are limited since she is paying out-of-pocket for this. Culture I did last time which was a PCR culture showed a scattered number of anaerobes low quantities. I did not think this was significant. 06/09/2018 the patient has not been using her pumps. She puts care of max in the area on the posterior popliteal fossa. She has no one to help her clean this area. Home  health is apparently coming by once a week. For some reason she has to purchase a lot of her supplies herself online. She does not have a lot of financial resources. She states the pumps hurt her and caused the wound to bleed. She is continuing to deteriorate Biopsy I did last visit did not show malignancy/fungus or atypical Mycobacterium. This is a second time I have biopsied this 06/30/2018; the patient states she is using her pumps at least once a day. She put silver alginate and a rolled towel in the area. She occasionally uses kerramax and/or ABD pads. She is decided that a application of silver nitrate I did because of very friable mucosa on this sometime in September is the reason that she could not use the pumps. I did not actually research this. This is clearly not the case 1/30; the patient states she is using her pumps once a day but sometimes for as long as 2 hours. I told her that she still needs to do this and additional time a day. She is using silver alginate. She states the [facility] hurts when she uses her pumps therefore she is not using this. She has ABD pads that she is getting from home health. Dimensions perhaps slightly improved 3/5; the patient uses her pumps once a day but she states for is sometimes as long as 2 hours. She has not managed to do this twice a day. She is using silver alginate. Separating these areas with ABDs and rolled towels. There is no change in dimensions of this wound if anything slightly larger although the surfaces look better 4/2; I do not see too much difference 1 way  or the other in the area on the left popliteal fossa. She is not using her pumps. She did describe an episode of pain and odor a week or 2 ago however she said it is mostly subsided. Still using silver alginate. 5/8; monthly follow-up for this large area in the left popliteal fossa. She is not using her compression pumps there is surrounding lymphedema from tissue in the posterior thigh in  the posterior calf. Her home health nurse had called 2 weeks ago to report black drainage on the surface, she used silver alginate after that and it seems to have cleared up. 6/4; Patient is not using pumps. Finds silver alginate painful. I think she is just using ABD's in the area. She has a new wound laterally to the original wound. Leaking copious lymphedema 7/9; monthly follow-up. Patient is using her pumps about once a week finds it painful. She uses some combination of silver calcium alginate. She has to buy her own supplies [place] and she uses an ABD. She has home health but her co-pay is such that it is cheaper for her to buy her supplies online 8/6-Patient is here for her monthly follow-up, she has started using her pumps again, apparently a couple of weeks ago she had a lot of pain and could not use the pumps, she is now using silver alginate, she states her left posterior calf wound had some drainage that was greenish now converted to serous 10/13; 83-monthfollow-up. The patient's wound is on the left popliteal fossa. Fairly substantial area in the middle of 2 pannus folds the lower part of her thigh and the upper part of her calf. She has been using silver alginate. As usual she presents with greenish drainage but the wound does not look infected 12/8; 271-monthollow-up. The patient now has 2 wounds in the popliteal fossa on the left. Fairly substantial area in the middle of 2 pannus folds in the lower part of her thigh and the upper part of her calf. She has been using silver alginate. There is a smaller wound laterally and posteriorly very difficult to see. The patient says she is using her compression pumps 3 times a week. She is not willing to separate the folds in this wound area. I am not sure I really understand the issue. 2/23; 2-52-monthllow-up. I did not see a second wound this time. But otherwise I think the area on her left popliteal fossa is unchanged. The wound is between  2 fairly substantial pannus fold in the lower part of her thigh and the upper part of her calf. She has been using silver alginate which she buys online herself. She asks me about different dressings suggested by home health. She is not using her compression pumps and she does not keep these pannus fold separated. 4/20; I follow this woman palliatively on a 2-m60-monthis. She has a chronic wound on the left popliteal fossa. This has been there for 4 years between 2 fairly substantial pannus folds on the lower part of her thigh and the upper part of her calf this drains weeping edema fluid. I biopsied this area on 2 different occasions there was no malignancy. She has external compression pumps but probably uses them maximum of 4 times a week. She is technically homebound but able to transfer herself from bed to wheelchair etc. We have been using silver alginate for a long period of time Our intake nurses reported a greenish drainage 6/15; palliative wound on the  left popliteal fossa. This is been there for several years as noted. She has 2 fairly substantial pannus areas that encompasses 1 from the posterior thigh and the other from the upper calf. She has compression pumps that we ordered for her early on she does not use them there is always a reason today it has she had to replace her lift chair. We have been using silver alginate She has [facility] home health and there are reports that she is having bleeding from certain areas of this wound. Not exactly certain where but it apparently pools around her foot this is happened 4 times since she was here last 2 months ago Electronic Signature(s) Signed: 12/05/2019 5:55:30 PM By: Linton Ham MD Entered By: Linton Ham on 12/05/2019 15:31:49 -------------------------------------------------------------------------------- Chemical Cauterization Details Patient Name: Date of Service: Sierra [employer]. 12/05/2019 2:30 PM Medical Record Number:  [id number] Patient Account Number: [id number] Date of Birth/Sex: Treating RN: 1965/10/09 (54 y.o. Nancy Fetter Primary Care Provider: Fanny Bien Other Clinician: Referring Provider: Treating Provider/Extender: Scheryl Marten in Treatment: [phone] Procedure Performed for: Wound #1 Left,Posterior Knee Performed By: Physician Ricard Dillon., MD Post Procedure Diagnosis Same as Pre-procedure Electronic Signature(s) Signed: 12/05/2019 5:55:30 PM By: Linton Ham MD Entered By: Linton Ham on 12/05/2019 15:29:50 -------------------------------------------------------------------------------- Physical Exam Details Patient Name: Date of Service: Debe Coder 12/05/2019 2:30 PM Medical Record Number: [id number] Patient Account Number: [id number] Date of Birth/Sex: Treating RN: 1966-04-20 (54 y.o. Nancy Fetter Primary Care Provider: Fanny Bien Other Clinician: Referring Provider: Treating Provider/Extender: Scheryl Marten in Treatment: 165 Constitutional Sitting or standing Blood Pressure is within target range for patient.. Patient is febrile today.Marland Kitchen Appears in no distress. Cardiovascular Pedal pulses are palpable. Edema and skin changes in the lower extremities above per usual.. Notes Wound exam; the patient has a substantial wound in the left popliteal fossa. I did not it identify any easily spontaneous bleeding areas although she does have a rim there runs horizontally that is hyper granulated and somewhat friable. I am assuming this is where this is bleeding from. I used silver nitrate on this line of tissue and another smaller area that looks somewhat vulnerable superiorly. Electronic Signature(s) Signed: 12/05/2019 5:55:30 PM By: Linton Ham MD Entered By: Linton Ham on 12/05/2019 15:34:33 -------------------------------------------------------------------------------- Physician  Orders Details Patient Name: Date of Service: Sierra Michail [employer]. 12/05/2019 2:30 PM Medical Record Number: [id number] Patient Account Number: [id number] Date of Birth/Sex: Treating RN: [birth year] (54 y.o. [name] Primary Care Provider: Fanny Bien Other Clinician: Referring Provider: Treating Provider/Extender: Scheryl Marten in Treatment: [phone] Verbal / Phone Orders: No Diagnosis Coding ICD-10 Coding Code Description I87.322 Chronic venous hypertension (idiopathic) with inflammation of left lower extremity I89.0 Lymphedema, not elsewhere classified L97.221 Non-pressure chronic ulcer of left calf limited to breakdown of skin E11.622 Type 2 diabetes mellitus with other skin ulcer E66.09 Other obesity due to excess calories Follow-up Appointments Other: - 2 months Dressing Change Frequency Wound #1 Left,Posterior Knee Change dressing every day. Wound Cleansing May shower and wash wound with soap and water. - also cleanse with Vashe solution Primary Wound Dressing Wound #1 Left,Posterior Knee Calcium Alginate with Silver Secondary Dressing Wound #1 Left,Posterior Knee Kerlix/Rolled Gauze ABD pad - or incontinence pads secure with transpore tape Edema Control Avoid standing for long periods of time Elevate legs to the level of the heart or above for 30 minutes daily  and/or when sitting, a frequency of: - throughout the day Exercise regularly Segmental Compressive Device. - lymphedema pumps 60 minutes 2 times per day! Off-Loading Other: - place rolled towel or washcloth in skin fold behind left knee to separate skin in fold Additional Orders / Instructions Follow Nutritious Diet - increase protein and vegetables to aid in wound healing. [facility] for wound care. - [place]. 3 times per week; patient is homebound. Electronic Signature(s) Signed: 12/05/2019 5:55:30 PM By: Linton Ham  MD Signed: 12/06/2019 9:54:21 AM By: Carlene Coria RN Entered By: Carlene Coria on 12/05/2019 14:52:30 -------------------------------------------------------------------------------- Problem List Details Patient Name: Date of Service: Sierra [employer]. 12/05/2019 2:30 PM Medical Record Number: [id number] Patient Account Number: [id number] Date of Birth/Sex: Treating RN: 1966/05/10 (54 y.o. [name] Primary Care Provider: Fanny Bien Other Clinician: Referring Provider: Treating Provider/Extender: Scheryl Marten in Treatment: [phone] Active Problems ICD-10 Encounter Code Description Active Date MDM Diagnosis I87.322 Chronic venous hypertension (idiopathic) with inflammation of left lower 11/05/2016 No Yes extremity I89.0 Lymphedema, not elsewhere classified 10/09/2016 No Yes L97.221 Non-pressure chronic ulcer of left calf limited to breakdown of skin 11/05/2016 No Yes E11.622 Type 2 diabetes mellitus with other skin ulcer 10/01/2016 No Yes E66.09 Other obesity due to excess calories 10/01/2016 No Yes Inactive Problems Resolved Problems Electronic Signature(s) Signed: 12/05/2019 5:55:30 PM By: Linton Ham MD Entered By: Linton Ham on 12/05/2019 15:29:34 -------------------------------------------------------------------------------- Progress Note Details Patient Name: Date of Service: [employer]. 12/05/2019 2:30 PM Medical Record Number: [id number] Patient Account Number: [id number] Date of Birth/Sex: Treating RN: [birth year] (54 y.o. Nancy Fetter Primary Care Provider: Fanny Bien Other Clinician: Referring Provider: Treating Provider/Extender: Scheryl Marten in Treatment: 165 Subjective History of Present Illness (HPI) The following HPI elements were documented for the patient's wound: Location: Left popliteal fossa Duration: She was aware of the wound October 2017 Context: The  development/occurrence is unclear Modifying Factors: Chronic pressure secondary to thigh pannus 10/01/16- She is here for initial evaluation of her left popliteal fossa ulcer. She states that she noticed the area in October 2017. She is unaware of actual causative factor, states she has a history of eczema and uses a brush for bathing. She was being treated by her PCP, using Aquaphor since then, and stopped using last week. She denies ever being on an antibiotic for this ulcer. She states that this drains excessively and soaks through her clothes, she is unable to visualize this area and has a friend that helps with the dressing changes. She was recently diagnosed with diabetes; A1c in December was 9; down to 7.5-8 in February. She is being managed on oral agents. She is a non-smoker. She has never been evaluated for venous reflux or lymphedema. 10/08/16- She is here in follow up regarding her left popliteal fossa pressure ulcer. She states that she did not receive any supplies as ordered. She states that the dressing material that was ordered and placed last week was in place for several days. She states that the odor and drainage is unchanged. She states that her PCP has written for 1 month off work, starting Monday 4/23. The culture obtained last week was mixed skin flora, no identifiable pathogen. 10/15/16; difficult wound in the left popliteal fossa in the setting of morbid obesity and lymphedema. We have been using silver alginate although it is not really clear to me that this stays on  for very long. She changes it once. Covering with AVD pads to try and separate wound from the surrounding pannus. A culture of this area showed both enterococcus and Enterobacter and I have her on a combination of amoxicillin and cipro. Although most Gram positives will be covered by Cipro, a big exception is Enterobacter particularly Enterobacter faecalis which generally requires ampicillin if indeed it is  sensitive. A final issue is that the patient is limited by the co-pay on her insurance with regards to actual wound care supplies. I didn'Ware get into this in much detail although he crossed my mind that we may need to bring her back more than once a week for dressing changes. 10/22/16; difficult wound in the left popliteal fossa. I think this is a lymphedema type issue in the middle lobe folds in the back of her knee. We have been using silver alginate, ABD pads. Dimensions today are improved, base of the wound is improved. I had some thoughts about biopsying this however overall this is improved today and all put that off for now 10/29/16; difficult wound in the left popliteal fossa which I think is a pressure wound from associated large folds of lymphedema. We have been using silver alginate. 11/05/16; difficult wound in the left popliteal fossa. Using silver alginate change 3x/wk. Wound measuring slightly smaller 11/12/16; difficult wound in the setting of chronic lymphedema. Left popliteal fossa. Using so over alginate change 3 times a week, once here and wants by home health and/or the patient's friend who is a Marine scientist. The dimensions of this appear to be improving, the surface is certainly improved 2/45/80; Patient now has lymphedema pumps. wound is slightly reduced in width. Still using silver alginate 11/26/16; patient has lymphedema pumps although she is having trouble with them in terms of fit. Nevertheless she tells me she is using them. She is still using silver alginate as the primary dressing 12/03/16; patient has lymphedema pumps and there is been significant improvement in the swelling in the leg. We've been using silver alginate but really only making minimal improvements if any in the overall wound area although the base of the wound is a lot better than when she first came here. 12/10/16; patient has increasing pain, drainage and odor this week. States this began to get worse over the weekend.  She is been using her pumps at home and the edema is certainly better in the leg. We changed to [facility] Blue last week. She has home health coming out once a week to change the dressing 12/17/16; culture I did last week showed a few MSSA. We called her in cephalexin at 500 4 times a day. She admits to less than 100% compliance with the 4 times a day dosing. I have encouraged her today. The surrounding tenderness and erythema in this wound is a lot better. Nursing reports that the diameter of the wound is down a centimeter. Healthy-looking wound base no surrounding tenderness. We continue to use silver alginate 12/24/16; she is still completing her antibiotics apparently not taking them exactly as prescribed. The wound is perhaps marginally smaller. Surface of this continues to look not 100% viable. We have been using silver collagen. Consider changing to Iodoflex 12/31/16; she is finished her antibiotics. There is no evidence to suspect ongoing infection in the wound. We have looked back over the pictures of the wound in the left popliteal fossa. She came in with a thick adherent necrotic eschar and now we are dealing with slight surface slough.  No debridement will be planned today. I'm going to try [facility] in lieu of the silver alginate starting today 01/07/17; patient continues to do well using Hydrofera Blue. Reliably using her external compression pumps twice a day 01/15/17; the patient has been using [facility] with a general improvement in her wound condition and size. However she went back to work yesterday it would seem that her use of the compression pumps this week is been intermittent and at work she is unable to change the dressings. Per our intake nurse arrived with a macerated dressing in place. 01/22/17 on evaluation today patient appears to be doing a little bit worse. She is having more discomfort and there's a erythema surrounding the wound bed behind her left knee. She states  this discomfort often is associated with infection and in fact this wound looks to likely be infected. She has no fevers, chills, nausea, vomiting, or diarrhea. 02/12/17; the patient's wound is not as good as I thought I would find it. She was making a good improvement when I saw her a month ago. She went back to work and largely she blames difficulties at work for this. Apparently during my absence she was treated with oral antibiotics starting on 8/3. I don'Ware believe there are any cultures 02/19/17; patient arrives in clinic today with the wound is essentially unchanged in terms of dimensions and wound appearance. We have been using silver alginate. She claims compliance with the compression pumps twice a day 02/25/17; no major change in the condition of the wound. We used Iodo flex last week with the purpose of trying to get a better-looking surface. She could not tolerate this because of discomfort. She found this especially bad when she used her compression pumps. Before this we use silver alginate again with not a lot of success. I changed her to [employer] which will certainly not have much debridement activity however I will probably change to weekly mechanical debridements. This will help with the drainage. She is now at home/on short-term disability. 03/11/17; the patient is now on disability. She is been using PolyMem AG wound for the first time looks a little better this week and dimensions have improved. She is compliant with her external compression pumps 03/18/16; the patient is now on disability at home using her palms. We've been using PolyMen AG. Wound certainly looks smaller. Arrives today with an adherent surface requiring debridement. 04/08/17; patient has not been here in 3 weeks. Using Anasept with PolyMem AG. She arrives today with an adherent surface over the wound. She did not wish debridement, there was edema fluid dripping through the wound surface even though she states she is  using her external compression pumps 04/15/17; patient is using Anasept with PolyMem and AG. She arrives today telling us that her leg is been hurting since a home care nurse staff her dressing into her on Sunday therefore she could not use her compression pumps in spite of this her dimensions appear to be better although this is a very difficult wound to measure in the popliteal fossa of the left knee 04/22/17; arrives today in clinic using Anasept and PolyMem AG. She is using her compression pumps per her description. The wound surface however once again is not going to allow healing. She still has weeping edema fluid coming through the wound. No major change in dimensions 04/29/17; arrives today with the major deterioration in the wound both in terms of dimensions and condition of the wound surface. She reports  increasing pain since earlier in the week at our intake nurse reports purulent drainage on the ABD pads.this is not going in the right direction. We put Hydrofera Blue on this last week but I'm going to change back to [employer] because of the drainage. I reviewed previous cultures last done in June showed methicillin sensitive staph aureus. We had been making progress with this wound presenting initially with a necrotic wound on the popliteal fossa on the right. We managed to get a viable surface and some reduction in wound dimensions however in the past 4 weeks this is been getting gradually worse and today much worse in terms of both size and condition of the wound surface. The patient is having pain and will require empiric antibiotics because of the purulent drainage and deterioration in the wound 05/20/17; I haven'Ware seen this patient in 3 weeks. Biopsy of this wound I did last time suggested extensive granulation tissue with collections of neutrophils lymphocytes plasma cells histiocytes and multinucleated giant cells. There was no evidence of malignancy. Deep tissue culture I did showed  MSSA and I gave her Keflex when she was here last time and renewed this for a week however she is still taking the medication I'm not certain she actually took it properly.the pathologist suggested the possibility of pyoderma gangrenosum, a vasculopathy. PAS stains and acid-fast stains were negative She also tells me that she is having mobility problems secondary to pain in both knees. She uses a walker but can barely make it to the bathroom. It sounds as though she has a history of chronic osteoarthritis and she tells me that she had a left anterior cruciate ligament tear at some time in the past 06/10/17; this is a patient that I haven'Ware seen in 3 weeks. She is completed her antibiotics. Biopsy suggested the possibility of infection versus pyoderma gangrenosum. I gave her 2 weeks of Keflex directed at [facility] she is repeated this.she called last week to request more antibiotics but I wanted to see her. She arrives today with her aunt. Apparently the patient has deteriorated in terms of function. According the patient and her aunt she can no longer ambulate or easily transfer. She is incontinent because she has urge incontinence etc. They requested admission to a rehabilitation facility or the hospital and then a rehabilitation facility. I was not aware that the patient was declining in terms a shin but according to our staff that's been the case they haven'Ware seen her walk in the him any months that she's been here. I wasn'Ware really aware of this. In fact the patient was working up until the end of August. In any case she arrives with the wound a lot worse 07/01/17; this is a patient I haven'Ware seen in 3 weeks. The last time she was here at the request of her family member we sent her to the ER. She apparently was kept overnight. She was treated for UTI. Attempts were made to find her long-term care although there was insurance and cost issues and she is back home. She now has her aunt living with her. I am  not clear what they are using to the wound on the posterior left knee/popliteal fossa. Last biopsy I did of this area did not document pyoderma which had been raised by another/previous biopsy. We are not clear today what they're actually putting on the wound 07/15/17; she arrives today with improvement in her wound dimensions although there is still weeping edema fluid. She claims she  is changing the dressing [silver alginate] twice daily. I've suggested increasing this to 3 times a day. She is changing the silver alginate twice a day 07/29/17; patient complains of pain drainage and odor coming from the wound which was different from 2 weeks ago.. We have been using silver alginate she has been compliant with her compression pumps 09/02/17; the patient continues to complain of pain and drainage. I gave her empiric antibiotics when she was last here 5 weeks ago but she says she only completed them recently. She seems confused about the time frame of her last visit here.she states she was concerned about an odor and stopped using the [facility] Blue on the wound in her left popliteal fossa about a week ago. I don'Ware think she is dressing this with anything since. She is using her lymphedema pumps once a day.previous biopsy of done of this area showed inflammation associated with stasis dermatitis or trauma She is still not walking apparently because of severe osteoarthritis in both knees. She thinks she'll be going back to work in June and she needs to be ambulatory although I really can'Ware see this happening. 09/16/17; the patient is been using a mixture of Hydrofera Blue and PolyMen that she had left over. I'm not really sure how reliable this is. She tells me she is using her external compression pumps once a day. She had a steroid shot in her left knee this morning 09/30/17; 2 week follow-up for this patient with a difficult wound behind her left popliteal fossa. She has home health. They're using Hydrofera  Blue. Measurement slightly smaller. She states she is using her compression pumps once a day 10/14/17; 2 week follow-up for this woman with a difficult wound with find her left popliteal fossa. She has home health. Using Hydrofera Blue. Measurements again today slightly smaller. Most of the surface of this covered in a nonviable tightly green adherent material. Some of this may be retained Hydrofera Blue nevertheless was debrided with great difficulty. She asked me about her upcoming disability which I think happens in June. I told her that from my point of view the wound itself does not qualify her to be disabled by itself. Now I'm aware that she can no longer stand and walk. This really deteriorated quite a bit during the course of the time she is been here. I suspect this is due to severe osteoarthritis of both knees and she is seen orthopedics for this. I think this is a source of her disability but I don'Ware really feel comfortable with this given the fact that I don'Ware precisely know the exact diagnosis here. 10/28/17; 2 week follow-up for this woman with a difficult wound in her left popliteal fossa. This is complicated by lymphedema. She is episodic in using her compression pumps we obtained. We've been using Hydrofera Blue. Dimensions of the wound are not any different however surface of this looks a lot better than last time. Prior to last visit at which time I had the debridement the wound, dimensions were slightly smaller. The patient came in last time talking to me about disability. I sent her to see her primary physician who I thought would do a proper evaluation for this patient with regards to her disability thoughts. The patient is able to stand and transfer but is no longer ambulatory. 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 . 11/08/17; this is a 2 week follow-up for patient with  a difficult wound in the left popliteal fossa complicated by  lymphedema. I am not sure about her compliance with the compression pumps we've been using Hydrofera Blue for 2 months now and we have been making decent progress however once again today she arrives with 50% of the overall wound area covered in tightly adherent black/green necrotic tissue. 12/02/17; difficult wound in the left popliteal fossa complicated by severe lymphedema. We've been using silver alginate although the patient ran out of this and she's been using Hydrofera Blue. Last time she is here she had a considerable amount of tightly adherent green necrotic tissue. Post debridement culture showed MSSA, I gave her 7 days worth of Septra DS which seems to have helped 12/24/17; almost palliative follow-up for this difficult area on the left popliteal fossa complicated by severe lymphedema. We've been using silver alginate although the patient tells me she's been using Hydrofera Blue. She does not separate the folds of this wound. She has been using her lymphedema pumps but not every day. 01/20/18 This is a difficult wound in the left popliteal fossa. Intake nurse noted some purulent drainage and necrotic material. She has severe lymphedema including the posterior thigh and the upper calf surrounding this wound and there is continued leaking edema fluid out of the wound surface. This is not going to heal like this. She states she is being compliant with the lymphedema pumps once sometimes twice a day. However this is not controlling the edema around the wound either in the calf or the posterior thigh 02/10/18; really not making any progress here. The wound is actually larger. There is no purulent drainage. Culture I did last time was negative/multiple organisms 03/10/18; 1 month follow-up. The wound may be measures slightly smaller. We've been using care max directly on the wound surface. The area definitely looks less moist. She tells me she is also you been using her pumps religiously twice a day.  In fact the area actually looks dry today. 04/12/2018; 1 month follow-up. She states after I used silver nitrate on this wound area a month ago that the pain was intolerable and continued all month. This made it too painful for her to use the care of max reliably or to use her compression pumps. She arrives in clinic today with her wound probably twice the size. There is no purulent drainage 05/03/2018; the patient has been using her pumps reliably twice a day over the last week but she finds the combination of the pump and a dressing on the wound to be unduly painful therefore there is not been a dressing in the last week. She arrives in clinic today with the wound continuing to enlarge. She did not tolerate silver nitrate nor Kerramax directly on the wound 05/26/2018; the patient has not been using her pumps. Her wound in the left posterior popliteal fossa continues to expand. We are using silver alginate although the options for primary dressings are limited since she is paying out-of-pocket for this. Culture I did last time which was a PCR culture showed a scattered number of anaerobes low quantities. I did not think this was significant. 06/09/2018 the patient has not been using her pumps. She puts care of max in the area on the posterior popliteal fossa. She has no one to help her clean this area. Home health is apparently coming by once a week. For some reason she has to purchase a lot of her supplies herself online. She does not have a lot of financial  resources. She states the pumps hurt her and caused the wound to bleed. She is continuing to deteriorate Biopsy I did last visit did not show malignancy/fungus or atypical Mycobacterium. This is a second time I have biopsied this 06/30/2018; the patient states she is using her pumps at least once a day. She put silver alginate and a rolled towel in the area. She occasionally uses kerramax and/or ABD pads. She is decided that a application of silver  nitrate I did because of very friable mucosa on this sometime in September is the reason that she could not use the pumps. I did not actually research this. This is clearly not the case 1/30; the patient states she is using her pumps once a day but sometimes for as long as 2 hours. I told her that she still needs to do this and additional time a day. She is using silver alginate. She states the [facility] hurts when she uses her pumps therefore she is not using this. She has ABD pads that she is getting from home health. Dimensions perhaps slightly improved 3/5; the patient uses her pumps once a day but she states for is sometimes as long as 2 hours. She has not managed to do this twice a day. She is using silver alginate. Separating these areas with ABDs and rolled towels. There is no change in dimensions of this wound if anything slightly larger although the surfaces look better 4/2; I do not see too much difference 1 way or the other in the area on the left popliteal fossa. She is not using her pumps. She did describe an episode of pain and odor a week or 2 ago however she said it is mostly subsided. Still using silver alginate. 5/8; monthly follow-up for this large area in the left popliteal fossa. She is not using her compression pumps there is surrounding lymphedema from tissue in the posterior thigh in the posterior calf. Her home health nurse had called 2 weeks ago to report black drainage on the surface, she used silver alginate after that and it seems to have cleared up. 6/4; Patient is not using pumps. Finds silver alginate painful. I think she is just using ABD's in the area. She has a new wound laterally to the original wound. Leaking copious lymphedema 7/9; monthly follow-up. Patient is using her pumps about once a week finds it painful. She uses some combination of silver calcium alginate. She has to buy her own supplies [place] and she uses an ABD. She has home health but her co-pay  is such that it is cheaper for her to buy her supplies online 8/6-Patient is here for her monthly follow-up, she has started using her pumps again, apparently a couple of weeks ago she had a lot of pain and could not use the pumps, she is now using silver alginate, she states her left posterior calf wound had some drainage that was greenish now converted to serous 10/13; 29-month follow-up. The patient's wound is on the left popliteal fossa. Fairly substantial area in the middle of 2 pannus folds the lower part of her thigh and the upper part of her calf. She has been using silver alginate. As usual she presents with greenish drainage but the wound does not look infected 12/8; 74-month follow-up. The patient now has 2 wounds in the popliteal fossa on the left. Fairly substantial area in the middle of 2 pannus folds in the lower part of her thigh and the upper part of  her calf. She has been using silver alginate. There is a smaller wound laterally and posteriorly very difficult to see. The patient says she is using her compression pumps 3 times a week. She is not willing to separate the folds in this wound area. I am not sure I really understand the issue. 2/23; 70-month follow-up. I did not see a second wound this time. But otherwise I think the area on her left popliteal fossa is unchanged. The wound is between 2 fairly substantial pannus fold in the lower part of her thigh and the upper part of her calf. She has been using silver alginate which she buys online herself. She asks me about different dressings suggested by home health. She is not using her compression pumps and she does not keep these pannus fold separated. 4/20; I follow this woman palliatively on a 70-month basis. She has a chronic wound on the left popliteal fossa. This has been there for 4 years between 2 fairly substantial pannus folds on the lower part of her thigh and the upper part of her calf this drains weeping edema fluid. I  biopsied this area on 2 different occasions there was no malignancy. She has external compression pumps but probably uses them maximum of 4 times a week. She is technically homebound but able to transfer herself from bed to wheelchair etc. We have been using silver alginate for a long period of time Our intake nurses reported a greenish drainage 6/15; palliative wound on the left popliteal fossa. This is been there for several years as noted. She has 2 fairly substantial pannus areas that encompasses 1 from the posterior thigh and the other from the upper calf. She has compression pumps that we ordered for her early on she does not use them there is always a reason today it has she had to replace her lift chair. We have been using silver alginate She has [facility] home health and there are reports that she is having bleeding from certain areas of this wound. Not exactly certain where but it apparently pools around her foot this is happened 4 times since she was here last 2 months ago Objective Constitutional Sitting or standing Blood Pressure is within target range for patient.. Patient is febrile today.Marland Kitchen Appears in no distress. Vitals Time Taken: 3:00 PM, Height: 70 in, Weight: 484 lbs, BMI: 69.4, Temperature: 100 F, Pulse: 121 bpm, Respiratory Rate: 22 breaths/min, Blood Pressure: 117/59 mmHg. Cardiovascular Pedal pulses are palpable. Edema and skin changes in the lower extremities above per usual.. General Notes: Wound exam; the patient has a substantial wound in the left popliteal fossa. I did not it identify any easily spontaneous bleeding areas although she does have a rim there runs horizontally that is hyper granulated and somewhat friable. I am assuming this is where this is bleeding from. I used silver nitrate on this line of tissue and another smaller area that looks somewhat vulnerable superiorly. Integumentary (Hair, Skin) Wound #1 status is Open. Original cause of wound was  Gradually Appeared. The wound is located on the Left,Posterior Knee. The wound measures 7cm length x 9cm width x 0.3cm depth; 49.48cm^2 area and 14.844cm^3 volume. There is Fat Layer (Subcutaneous Tissue) Exposed exposed. There is no tunneling or undermining noted. There is a large amount of serosanguineous drainage noted. The wound margin is flat and intact. There is large (67-100%) **red, friable granulation within the wound bed. There is a small (1-33%) amount of necrotic tissue within the wound bed including  Adherent Slough. Assessment Active Problems ICD-10 Chronic venous hypertension (idiopathic) with inflammation of left lower extremity Lymphedema, not elsewhere classified Non-pressure chronic ulcer of left calf limited to breakdown of skin Type 2 diabetes mellitus with other skin ulcer Other obesity due to excess calories Procedures Wound #1 Pre-procedure diagnosis of Wound #1 is a Diabetic Wound/Ulcer of the Lower Extremity located on the Left,Posterior Knee . An Chemical Cauterization procedure was performed by Ricard Dillon., MD. Post procedure Diagnosis Wound #1: Same as Pre-Procedure Plan Follow-up Appointments: Other: - 2 months Dressing Change Frequency: Wound #1 Left,Posterior Knee: Change dressing every day. Wound Cleansing: May shower and wash wound with soap and water. - also cleanse with Vashe solution Primary Wound Dressing: Wound #1 Left,Posterior Knee: Calcium Alginate with Silver Secondary Dressing: Wound #1 Left,Posterior Knee: Kerlix/Rolled Gauze ABD pad - or incontinence pads secure with transpore tape Edema Control: Avoid standing for long periods of time Elevate legs to the level of the heart or above for 30 minutes daily and/or when sitting, a frequency of: - throughout the day Exercise regularly Segmental Compressive Device. - lymphedema pumps 60 minutes 2 times per day! Off-Loading: Other: - place rolled towel or washcloth in skin fold behind  left knee to separate skin in fold Additional Orders / Instructions: Follow Nutritious Diet - increase protein and vegetables to aid in wound healing. Home Health: [facility] [place] [facility] for wound care. - [place]. 3 times per week; patient is homebound. 1. We continued with silver alginate/ABDs 2. I can never convince her to use her pumps on a reliable basis. She has not used them since last time she was here. 3. I view this is a palliative situation we are not going to be able to heal this wound. With very aggressive weekly visits use of compression pumps we got this down to the size of a silver dollar at 1 point but never any better. I biopsied this twice there was no evidence of anything atypical Electronic Signature(s) Signed: 12/05/2019 5:55:30 PM By: Linton Ham MD Entered By: Linton Ham on 12/05/2019 15:35:58 -------------------------------------------------------------------------------- SuperBill Details Patient Name: Date of Service: [employer] 12/05/2019 Medical Record Number: [id number] Patient Account Number: [id number] Date of Birth/Sex: Treating RN: [birth year] (54 y.o. [name] Primary Care Provider: Fanny Bien Other Clinician: Referring Provider: Treating Provider/Extender: Scheryl Marten in Treatment: 165 Diagnosis Coding ICD-10 Codes Code Description [phone] Chronic venous hypertension (idiopathic) with inflammation of left lower extremity I89.0 Lymphedema, not elsewhere classified L97.221 Non-pressure chronic ulcer of left calf limited to breakdown of skin E11.622 Type 2 diabetes mellitus with other skin ulcer E66.09 Other obesity due to excess calories Facility Procedures CPT4 Code: [id number] Description: 89381 - CHEM CAUT GRANULATION TISS ICD-10 Diagnosis Description E11.622 Type 2 diabetes mellitus with other skin ulcer Modifier: Quantity: 1 Physician Procedures : CPT4 Code  Description Modifier [id number] - WC PHYS CHEM CAUT GRAN TISSUE ICD-10 Diagnosis Description E11.622 Type 2 diabetes mellitus with other skin ulcer Quantity: 1 Electronic Signature(s) Signed: 12/05/2019 5:55:30 PM By: Linton Ham MD Entered By: Linton Ham on 12/05/2019 15:36:12

## 2019-12-14 DIAGNOSIS — E119 Type 2 diabetes mellitus without complications: Secondary | ICD-10-CM

## 2019-12-14 DIAGNOSIS — E669 Obesity, unspecified: Secondary | ICD-10-CM | POA: Diagnosis present

## 2019-12-14 DIAGNOSIS — I87399 Chronic venous hypertension (idiopathic) with other complications of unspecified lower extremity: Secondary | ICD-10-CM | POA: Diagnosis present

## 2019-12-14 DIAGNOSIS — I890 Lymphedema, not elsewhere classified: Secondary | ICD-10-CM

## 2020-01-16 ENCOUNTER — Other Ambulatory Visit: Payer: Self-pay

## 2020-01-16 ENCOUNTER — Ambulatory Visit: Payer: Medicare PPO | Attending: Family

## 2020-01-16 DIAGNOSIS — Z23 Encounter for immunization: Secondary | ICD-10-CM

## 2020-01-16 NOTE — Progress Notes (Signed)
   Covid-19 Vaccination Clinic  Name:  CHANTAL WORTHEY    MRN: [id number] DOB: 09-12-1965  01/16/2020  Ms. Oconnor was observed post Covid-19 immunization for 15 minutes without incident. She was provided with Vaccine Information Sheet and instruction to access the V-Safe system.   Ms. Sweeting was instructed to call 911 with any severe reactions post vaccine: Marland Kitchen Difficulty breathing  . Swelling of face and throat  . A fast heartbeat  . A bad rash all over body  . Dizziness and weakness   Immunizations Administered    Name Date Dose VIS Date Route   Moderna COVID-19 Vaccine 01/16/2020  3:20 PM 0.5 mL 05/2019 Intramuscular   Manufacturer: Moderna   Lot: [id number]   [place]: [id number]

## 2020-01-23 ENCOUNTER — Ambulatory Visit: Payer: Medicare PPO

## 2020-01-30 ENCOUNTER — Encounter (HOSPITAL_BASED_OUTPATIENT_CLINIC_OR_DEPARTMENT_OTHER): Payer: Medicare PPO | Admitting: Internal Medicine

## 2020-02-06 ENCOUNTER — Encounter (HOSPITAL_BASED_OUTPATIENT_CLINIC_OR_DEPARTMENT_OTHER): Payer: Medicare PPO | Admitting: Internal Medicine

## 2020-02-13 ENCOUNTER — Encounter (HOSPITAL_BASED_OUTPATIENT_CLINIC_OR_DEPARTMENT_OTHER): Payer: Medicare PPO | Attending: Internal Medicine | Admitting: Internal Medicine

## 2020-02-13 ENCOUNTER — Ambulatory Visit: Payer: Medicare PPO | Attending: Family

## 2020-02-13 DIAGNOSIS — M170 Bilateral primary osteoarthritis of knee: Secondary | ICD-10-CM | POA: Insufficient documentation

## 2020-02-13 DIAGNOSIS — N3941 Urge incontinence: Secondary | ICD-10-CM | POA: Diagnosis not present

## 2020-02-13 DIAGNOSIS — Z6841 Body Mass Index (BMI) 40.0 and over, adult: Secondary | ICD-10-CM | POA: Diagnosis not present

## 2020-02-13 DIAGNOSIS — E11622 Type 2 diabetes mellitus with other skin ulcer: Secondary | ICD-10-CM | POA: Insufficient documentation

## 2020-02-13 DIAGNOSIS — L97221 Non-pressure chronic ulcer of left calf limited to breakdown of skin: Secondary | ICD-10-CM | POA: Diagnosis not present

## 2020-02-13 DIAGNOSIS — I890 Lymphedema, not elsewhere classified: Secondary | ICD-10-CM | POA: Insufficient documentation

## 2020-02-15 NOTE — Progress Notes (Signed)
[name] ([id number]) . Visit Report for 02/13/2020 HPI Details Patient Name: Date of Service: MCA [name], [name] 02/13/2020 1:45 PM Medical Record Number: [id number] Patient Account Number: [id number] Date of Birth/Sex: Treating RN: 05-Jul-1965 (54 y.o. [name] Primary Care Provider: Fanny Ware Other Clinician: Referring Provider: Treating Provider/Extender: [name] in Treatment: 175 History of Present Illness Location: Left popliteal fossa Duration: She was aware of the wound October 2017 Context: The development/occurrence is unclear Modifying Factors: Chronic pressure secondary to thigh pannus HPI Description: 10/01/16- She is here for initial evaluation of her left popliteal fossa ulcer. She states that she noticed the area in October 2017. She is unaware of actual causative factor, states she has a history of eczema and uses a brush for bathing. She was being treated by her PCP, using Aquaphor since then, and stopped using last week. She denies ever being on an antibiotic for this ulcer. She states that this drains excessively and soaks through her clothes, she is unable to visualize this area and has a friend that helps with the dressing changes. She was recently diagnosed with diabetes; A1c in December was 9; down to 7.5-8 in February. She is being managed on oral agents. She is a non-smoker. She has never been evaluated for venous reflux or lymphedema. 10/08/16- She is here in follow up regarding her left popliteal fossa pressure ulcer. She states that she did not receive any supplies as ordered. She states that the dressing material that was ordered and placed last week was in place for several days. She states that the odor and drainage is unchanged. She states that her PCP has written for 1 month off work, starting Monday 4/23. The culture obtained last week was mixed skin flora, no identifiable pathogen. 10/15/16;  difficult wound in the left popliteal fossa in the setting of morbid obesity and lymphedema. We have been using silver alginate although it is not really clear to me that this stays on for very long. She changes it once. Covering with AVD pads to try and separate wound from the surrounding pannus. A culture of this area showed both enterococcus and Enterobacter and I have her on a combination of amoxicillin and cipro. Although most Gram positives will be covered by Cipro, a big exception is Enterobacter particularly Enterobacter faecalis which generally requires ampicillin if indeed it is sensitive. A final issue is that the patient is limited by the co-pay on her insurance with regards to actual wound care supplies. I didn'Ware get into this in much detail although he crossed my mind that we may need to bring her back more than once a week for dressing changes. 10/22/16; difficult wound in the left popliteal fossa. I think this is a lymphedema type issue in the middle lobe folds in the back of her knee. We have been using silver alginate, ABD pads. Dimensions today are improved, base of the wound is improved. I had some thoughts about biopsying this however overall this is improved today and all put that off for now 10/29/16; difficult wound in the left popliteal fossa which I think is a pressure wound from associated large folds of lymphedema. We have been using silver alginate. 11/05/16; difficult wound in the left popliteal fossa. Using silver alginate change 3x/wk. Wound measuring slightly smaller 11/12/16; difficult wound in the setting of chronic lymphedema. Left popliteal fossa. Using so over alginate change 3 times a week, once here and wants by home health and/or  the patient's friend who is a Marine scientist. The dimensions of this appear to be improving, the surface is certainly improved 0/93/26; Patient now has lymphedema pumps. wound is slightly reduced in width. Still using silver alginate 11/26/16; patient  has lymphedema pumps although she is having trouble with them in terms of fit. Nevertheless she tells me she is using them. She is still using silver alginate as the primary dressing 12/03/16; patient has lymphedema pumps and there is been significant improvement in the swelling in the leg. We've been using silver alginate but really only making minimal improvements if any in the overall wound area although the base of the wound is a lot better than when she first came here. 12/10/16; patient has increasing pain, drainage and odor this week. States this began to get worse over the weekend. She is been using her pumps at home and the edema is certainly better in the leg. We changed to [facility] Blue last week. She has home health coming out once a week to change the dressing 12/17/16; culture I did last week showed a few MSSA. We called her in cephalexin at 500 4 times a day. She admits to less than 100% compliance with the 4 times a day dosing. I have encouraged her today. The surrounding tenderness and erythema in this wound is a lot better. Nursing reports that the diameter of the wound is down a centimeter. Healthy-looking wound base no surrounding tenderness. We continue to use silver alginate 12/24/16; she is still completing her antibiotics apparently not taking them exactly as prescribed. The wound is perhaps marginally smaller. Surface of this continues to look not 100% viable. We have been using silver collagen. Consider changing to Iodoflex 12/31/16; she is finished her antibiotics. There is no evidence to suspect ongoing infection in the wound. We have looked back over the pictures of the wound in the left popliteal fossa. She came in with a thick adherent necrotic eschar and now we are dealing with slight surface slough. No debridement will be planned today. I'm going to try [facility] in lieu of the silver alginate starting today 01/07/17; patient continues to do well using Hydrofera Blue.  Reliably using her external compression pumps twice a day 01/15/17; the patient has been using [facility] with a general improvement in her wound condition and size. However she went back to work yesterday it would seem that her use of the compression pumps this week is been intermittent and at work she is unable to change the dressings. Per our intake nurse arrived with a macerated dressing in place. 01/22/17 on evaluation today patient appears to be doing a little bit worse. She is having more discomfort and there's a erythema surrounding the wound bed behind her left knee. She states this discomfort often is associated with infection and in fact this wound looks to likely be infected. She has no fevers, chills, nausea, vomiting, or diarrhea. 02/12/17; the patient's wound is not as good as I thought I would find it. She was making a good improvement when I saw her a month ago. She went back to work and largely she blames difficulties at work for this. Apparently during my absence she was treated with oral antibiotics starting on 8/3. I don'Ware believe there are any cultures 02/19/17; patient arrives in clinic today with the wound is essentially unchanged in terms of dimensions and wound appearance. We have been using silver alginate. She claims compliance with the compression pumps twice a day 02/25/17; no  major change in the condition of the wound. We used Iodo flex last week with the purpose of trying to get a better-looking surface. She could not tolerate this because of discomfort. She found this especially bad when she used her compression pumps. Before this we use silver alginate again with not a lot of success. I changed her to [employer] which will certainly not have much debridement activity however I will probably change to weekly mechanical debridements. This will help with the drainage. She is now at home/on short-term disability. 03/11/17; the patient is now on disability. She is been using  PolyMem AG wound for the first time looks a little better this week and dimensions have improved. She is compliant with her external compression pumps 03/18/16; the patient is now on disability at home using her palms. We've been using PolyMen AG. Wound certainly looks smaller. Arrives today with an adherent surface requiring debridement. 04/08/17; patient has not been here in 3 weeks. Using Anasept with PolyMem AG. She arrives today with an adherent surface over the wound. She did not wish debridement, there was edema fluid dripping through the wound surface even though she states she is using her external compression pumps 04/15/17; patient is using Anasept with PolyMem and AG. She arrives today telling us that her leg is been hurting since a home care nurse staff her dressing into her on Sunday therefore she could not use her compression pumps in spite of this her dimensions appear to be better although this is a very difficult wound to measure in the popliteal fossa of the left knee 04/22/17; arrives today in clinic using Anasept and PolyMem AG. She is using her compression pumps per her description. The wound surface however once again is not going to allow healing. She still has weeping edema fluid coming through the wound. No major change in dimensions 04/29/17; arrives today with the major deterioration in the wound both in terms of dimensions and condition of the wound surface. She reports increasing pain since earlier in the week at our intake nurse reports purulent drainage on the ABD pads.this is not going in the right direction. We put Hydrofera Blue on this last week but I'm going to change back to [employer] because of the drainage. I reviewed previous cultures last done in June showed methicillin sensitive staph aureus. We had been making progress with this wound presenting initially with a necrotic wound on the popliteal fossa on the right. We managed to get a viable surface and some  reduction in wound dimensions however in the past 4 weeks this is been getting gradually worse and today much worse in terms of both size and condition of the wound surface. The patient is having pain and will require empiric antibiotics because of the purulent drainage and deterioration in the wound 05/20/17; I haven'Ware seen this patient in 3 weeks. Biopsy of this wound I did last time suggested extensive granulation tissue with collections of neutrophils lymphocytes plasma cells histiocytes and multinucleated giant cells. There was no evidence of malignancy. Deep tissue culture I did showed MSSA and I gave her Keflex when she was here last time and renewed this for a week however she is still taking the medication I'm not certain she actually took it properly.the pathologist suggested the possibility of pyoderma gangrenosum, a vasculopathy. PAS stains and acid-fast stains were negative She also tells me that she is having mobility problems secondary to pain in both knees. She uses a walker but can  barely make it to the bathroom. It sounds as though she has a history of chronic osteoarthritis and she tells me that she had a left anterior cruciate ligament tear at some time in the past 06/10/17; this is a patient that I haven'Ware seen in 3 weeks. She is completed her antibiotics. Biopsy suggested the possibility of infection versus pyoderma gangrenosum. I gave her 2 weeks of Keflex directed at [facility] she is repeated this.she called last week to request more antibiotics but I wanted to see her. She arrives today with her aunt. Apparently the patient has deteriorated in terms of function. According the patient and her aunt she can no longer ambulate or easily transfer. She is incontinent because she has urge incontinence etc. They requested admission to a rehabilitation facility or the hospital and then a rehabilitation facility. I was not aware that the patient was declining in terms a shin but according to  our staff that's been the case they haven'Ware seen her walk in the him any months that she's been here. I wasn'Ware really aware of this. In fact the patient was working up until the end of August. In any case she arrives with the wound a lot worse 07/01/17; this is a patient I haven'Ware seen in 3 weeks. The last time she was here at the request of her family member we sent her to the ER. She apparently was kept overnight. She was treated for UTI. Attempts were made to find her long-term care although there was insurance and cost issues and she is back home. She now has her aunt living with her. I am not clear what they are using to the wound on the posterior left knee/popliteal fossa. Last biopsy I did of this area did not document pyoderma which had been raised by another/previous biopsy. We are not clear today what they're actually putting on the wound 07/15/17; she arrives today with improvement in her wound dimensions although there is still weeping edema fluid. She claims she is changing the dressing [silver alginate] twice daily. I've suggested increasing this to 3 times a day. She is changing the silver alginate twice a day 07/29/17; patient complains of pain drainage and odor coming from the wound which was different from 2 weeks ago.. We have been using silver alginate she has been compliant with her compression pumps 09/02/17; the patient continues to complain of pain and drainage. I gave her empiric antibiotics when she was last here 5 weeks ago but she says she only completed them recently. She seems confused about the time frame of her last visit here.she states she was concerned about an odor and stopped using the [facility] Blue on the wound in her left popliteal fossa about a week ago. I don'Ware think she is dressing this with anything since. She is using her lymphedema pumps once a day.previous biopsy of done of this area showed inflammation associated with stasis dermatitis or trauma She is still  not walking apparently because of severe osteoarthritis in both knees. She thinks she'll be going back to work in June and she needs to be ambulatory although I really can'Ware see this happening. 09/16/17; the patient is been using a mixture of Hydrofera Blue and PolyMen that she had left over. I'm not really sure how reliable this is. She tells me she is using her external compression pumps once a day. She had a steroid shot in her left knee this morning 09/30/17; 2 week follow-up for this patient with a difficult  wound behind her left popliteal fossa. She has home health. They're using Hydrofera Blue. Measurement slightly smaller. She states she is using her compression pumps once a day 10/14/17; 2 week follow-up for this woman with a difficult wound with find her left popliteal fossa. She has home health. Using Hydrofera Blue. Measurements again today slightly smaller. Most of the surface of this covered in a nonviable tightly green adherent material. Some of this may be retained Hydrofera Blue nevertheless was debrided with great difficulty. She asked me about her upcoming disability which I think happens in June. I told her that from my point of view the wound itself does not qualify her to be disabled by itself. Now I'm aware that she can no longer stand and walk. This really deteriorated quite a bit during the course of the time she is been here. I suspect this is due to severe osteoarthritis of both knees and she is seen orthopedics for this. I think this is a source of her disability but I don'Ware really feel comfortable with this given the fact that I don'Ware precisely know the exact diagnosis here. 10/28/17; 2 week follow-up for this woman with a difficult wound in her left popliteal fossa. This is complicated by lymphedema. She is episodic in using her compression pumps we obtained. We've been using Hydrofera Blue. Dimensions of the wound are not any different however surface of this looks a lot  better than last time. Prior to last visit at which time I had the debridement the wound, dimensions were slightly smaller. The patient came in last time talking to me about disability. I sent her to see her primary physician who I thought would do a proper evaluation for this patient with regards to her disability thoughts. The patient is able to stand and transfer but is no longer ambulatory. 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 . 11/08/17; this is a 2 week follow-up for patient with a difficult wound in the left popliteal fossa complicated by lymphedema. I am not sure about her compliance with the compression pumps we've been using Hydrofera Blue for 2 months now and we have been making decent progress however once again today she arrives with 50% of the overall wound area covered in tightly adherent black/green necrotic tissue. 12/02/17; difficult wound in the left popliteal fossa complicated by severe lymphedema. We've been using silver alginate although the patient ran out of this and she's been using Hydrofera Blue. Last time she is here she had a considerable amount of tightly adherent green necrotic tissue. Post debridement culture showed MSSA, I gave her 7 days worth of Septra DS which seems to have helped 12/24/17; almost palliative follow-up for this difficult area on the left popliteal fossa complicated by severe lymphedema. We've been using silver alginate although the patient tells me she's been using Hydrofera Blue. She does not separate the folds of this wound. She has been using her lymphedema pumps but not every day. 01/20/18 This is a difficult wound in the left popliteal fossa. Intake nurse noted some purulent drainage and necrotic material. She has severe lymphedema including the posterior thigh and the upper calf surrounding this wound and there is continued leaking edema fluid out of the wound surface. This is not going to heal  like this. She states she is being compliant with the lymphedema pumps once sometimes twice a day. However this is not controlling the edema around the wound  either in the calf or the posterior thigh 02/10/18; really not making any progress here. The wound is actually larger. There is no purulent drainage. Culture I did last time was negative/multiple organisms 03/10/18; 1 month follow-up. The wound may be measures slightly smaller. We've been using care max directly on the wound surface. The area definitely looks less moist. She tells me she is also you been using her pumps religiously twice a day. In fact the area actually looks dry today. 04/12/2018; 1 month follow-up. She states after I used silver nitrate on this wound area a month ago that the pain was intolerable and continued all month. This made it too painful for her to use the care of max reliably or to use her compression pumps. She arrives in clinic today with her wound probably twice the size. There is no purulent drainage 05/03/2018; the patient has been using her pumps reliably twice a day over the last week but she finds the combination of the pump and a dressing on the wound to be unduly painful therefore there is not been a dressing in the last week. She arrives in clinic today with the wound continuing to enlarge. She did not tolerate silver nitrate nor Kerramax directly on the wound 05/26/2018; the patient has not been using her pumps. Her wound in the left posterior popliteal fossa continues to expand. We are using silver alginate although the options for primary dressings are limited since she is paying out-of-pocket for this. Culture I did last time which was a PCR culture showed a scattered number of anaerobes low quantities. I did not think this was significant. 06/09/2018 the patient has not been using her pumps. She puts care of max in the area on the posterior popliteal fossa. She has no one to help her clean this area. Home  health is apparently coming by once a week. For some reason she has to purchase a lot of her supplies herself online. She does not have a lot of financial resources. She states the pumps hurt her and caused the wound to bleed. She is continuing to deteriorate Biopsy I did last visit did not show malignancy/fungus or atypical Mycobacterium. This is a second time I have biopsied this 06/30/2018; the patient states she is using her pumps at least once a day. She put silver alginate and a rolled towel in the area. She occasionally uses kerramax and/or ABD pads. She is decided that a application of silver nitrate I did because of very friable mucosa on this sometime in September is the reason that she could not use the pumps. I did not actually research this. This is clearly not the case 1/30; the patient states she is using her pumps once a day but sometimes for as long as 2 hours. I told her that she still needs to do this and additional time a day. She is using silver alginate. She states the [facility] hurts when she uses her pumps therefore she is not using this. She has ABD pads that she is getting from home health. Dimensions perhaps slightly improved 3/5; the patient uses her pumps once a day but she states for is sometimes as long as 2 hours. She has not managed to do this twice a day. She is using silver alginate. Separating these areas with ABDs and rolled towels. There is no change in dimensions of this wound if anything slightly larger although the surfaces look better 4/2; I do not see too much difference 1 way  or the other in the area on the left popliteal fossa. She is not using her pumps. She did describe an episode of pain and odor a week or 2 ago however she said it is mostly subsided. Still using silver alginate. 5/8; monthly follow-up for this large area in the left popliteal fossa. She is not using her compression pumps there is surrounding lymphedema from tissue in the posterior thigh in  the posterior calf. Her home health nurse had called 2 weeks ago to report black drainage on the surface, she used silver alginate after that and it seems to have cleared up. 6/4; Patient is not using pumps. Finds silver alginate painful. I think she is just using ABD's in the area. She has a new wound laterally to the original wound. Leaking copious lymphedema 7/9; monthly follow-up. Patient is using her pumps about once a week finds it painful. She uses some combination of silver calcium alginate. She has to buy her own supplies [place] and she uses an ABD. She has home health but her co-pay is such that it is cheaper for her to buy her supplies online 8/6-Patient is here for her monthly follow-up, she has started using her pumps again, apparently a couple of weeks ago she had a lot of pain and could not use the pumps, she is now using silver alginate, she states her left posterior calf wound had some drainage that was greenish now converted to serous 10/13; 83-monthfollow-up. The patient's wound is on the left popliteal fossa. Fairly substantial area in the middle of 2 pannus folds the lower part of her thigh and the upper part of her calf. She has been using silver alginate. As usual she presents with greenish drainage but the wound does not look infected 12/8; 271-monthollow-up. The patient now has 2 wounds in the popliteal fossa on the left. Fairly substantial area in the middle of 2 pannus folds in the lower part of her thigh and the upper part of her calf. She has been using silver alginate. There is a smaller wound laterally and posteriorly very difficult to see. The patient says she is using her compression pumps 3 times a week. She is not willing to separate the folds in this wound area. I am not sure I really understand the issue. 2/23; 2-52-monthllow-up. I did not see a second wound this time. But otherwise I think the area on her left popliteal fossa is unchanged. The wound is between  2 fairly substantial pannus fold in the lower part of her thigh and the upper part of her calf. She has been using silver alginate which she buys online herself. She asks me about different dressings suggested by home health. She is not using her compression pumps and she does not keep these pannus fold separated. 4/20; I follow this woman palliatively on a 2-m60-monthis. She has a chronic wound on the left popliteal fossa. This has been there for 4 years between 2 fairly substantial pannus folds on the lower part of her thigh and the upper part of her calf this drains weeping edema fluid. I biopsied this area on 2 different occasions there was no malignancy. She has external compression pumps but probably uses them maximum of 4 times a week. She is technically homebound but able to transfer herself from bed to wheelchair etc. We have been using silver alginate for a long period of time Our intake nurses reported a greenish drainage 6/15; palliative wound on the  left popliteal fossa. This is been there for several years as noted. She has 2 fairly substantial pannus areas that encompasses 1 from the posterior thigh and the other from the upper calf. She has compression pumps that we ordered for her early on she does not use them there is always a reason today it has she had to replace her lift chair. We have been using silver alginate She has [facility] home health and there are reports that she is having bleeding from certain areas of this wound. Not exactly certain where but it apparently pools around her foot this is happened 4 times since she was here last 2 months ago 8/24; 8-month follow-up for chronic wound on the left popliteal fossa. This is in the setting of severe lymphedema. The patient tells me she has recently gotten a lift chair and is going to try to use her compression pumps again. We have been using silver alginate with ABDs to the wounds for a prolonged period of time. The patient is  basically confined to home now. She moves around mostly by pushing herself in a seated walker. She is able to stand and transfer perhaps take a few steps. Miraculously she is managing to hang on at home Electronic Signature(s) Signed: 02/15/2020 6:08:30 PM By: Linton Ham MD Entered By: Linton Ham on 02/13/2020 15:43:23 -------------------------------------------------------------------------------- Physical Exam Details Patient Name: Date of Service: Debe Coder 02/13/2020 1:45 PM Medical Record Number: [id number] Patient Account Number: [id number] Date of Birth/Sex: Treating RN: 08/19/1965 (54 y.o. [name] Primary Care Provider: Fanny Ware Other Clinician: Referring Provider: Treating Provider/Extender: [name] in Treatment: 175 Constitutional Sitting or standing Blood Pressure is within target range for patient.. Pulse regular and within target range for patient.Marland Kitchen Respirations regular, non-labored and within target range.. Temperature is normal and within the target range for the patient.Marland Kitchen Appears in no distress. Cardiovascular Bilateral lymphedema. Massive edema in the left posterior thigh is noted. Integumentary (Hair, Skin) No evidence of infection around the wound. Notes Wound exam; the patient has the same substantial looking wound in the left popliteal fossa. There is rims of epithelialization superiorly here but generally this is not an area of its going to heal. Under illumination there is some debris on the surface. No debridement was done. She has a colossal amount of edema in her posterior thigh which I think is lymphedema. Electronic Signature(s) Signed: 02/15/2020 6:08:30 PM By: Linton Ham MD Entered By: Linton Ham on 02/13/2020 15:45:04 -------------------------------------------------------------------------------- Physician Orders Details Patient Name: Date of Service: MCA Michail Jewels Ware.  02/13/2020 1:45 PM Medical Record Number: [id number] Patient Account Number: [id number] Date of Birth/Sex: Treating RN: [birth year] (54 y.o. [name] Primary Care Provider: Fanny Ware Other Clinician: Referring Provider: Treating Provider/Extender: [name] in Treatment: [phone] Verbal / Phone Orders: No Diagnosis Coding ICD-10 Coding Code Description I87.322 Chronic venous hypertension (idiopathic) with inflammation of left lower extremity I89.0 Lymphedema, not elsewhere classified L97.221 Non-pressure chronic ulcer of left calf limited to breakdown of skin E11.622 Type 2 diabetes mellitus with other skin ulcer E66.09 Other obesity due to excess calories Follow-up Appointments Other: - 2 months Dressing Change Frequency Wound #1 Left,Posterior Knee Change dressing three times week. Wound Cleansing May shower and wash wound with soap and water. - also cleanse with Vashe solution Primary Wound Dressing Wound #1 Left,Posterior Knee Calcium Alginate with Silver Secondary Dressing Wound #1 Left,Posterior Knee Kerlix/Rolled Gauze ABD pad -  or incontinence pads secure with transpore tape Edema Control Avoid standing for long periods of time Elevate legs to the level of the heart or above for 30 minutes daily and/or when sitting, a frequency of: - throughout the day Exercise regularly Segmental Compressive Device. - lymphedema pumps 60 minutes 2 times per day! Off-Loading Other: - place rolled towel or washcloth in skin fold behind left knee to separate skin in fold Additional Orders / Instructions Follow Nutritious Diet - increase protein and vegetables to aid in wound healing. [facility] for wound care. - [place]. 3 times per week; patient is homebound. Electronic Signature(s) Signed: 02/15/2020 4:47:33 PM By: Levan Hurst RN, BSN Signed: 02/15/2020 6:08:30 PM By: Linton Ham MD Entered  By: Levan Hurst on 02/13/2020 16:08:39 -------------------------------------------------------------------------------- Problem List Details Patient Name: Date of Service: MCA Michail Jewels Ware. 02/13/2020 1:45 PM Medical Record Number: [id number] Patient Account Number: [id number] Date of Birth/Sex: Treating RN: [birth year] (54 y.o. [name] Primary Care Provider: Fanny Ware Other Clinician: Referring Provider: Treating Provider/Extender: [name] in Treatment: [phone] Active Problems ICD-10 Encounter Code Description Active Date MDM Diagnosis I87.322 Chronic venous hypertension (idiopathic) with inflammation of left lower 11/05/2016 No Yes extremity I89.0 Lymphedema, not elsewhere classified 10/09/2016 No Yes L97.221 Non-pressure chronic ulcer of left calf limited to breakdown of skin 11/05/2016 No Yes E11.622 Type 2 diabetes mellitus with other skin ulcer 10/01/2016 No Yes E66.09 Other obesity due to excess calories 10/01/2016 No Yes Inactive Problems Resolved Problems Electronic Signature(s) Signed: 02/15/2020 6:08:30 PM By: Linton Ham MD Entered By: Linton Ham on 02/13/2020 15:40:59 -------------------------------------------------------------------------------- Progress Note Details Patient Name: Date of Service: Noralee Space Ware. 02/13/2020 1:45 PM Medical Record Number: [id number] Patient Account Number: [id number] Date of Birth/Sex: Treating RN: [birth year] (54 y.o. [name] Primary Care Provider: Fanny Ware Other Clinician: Referring Provider: Treating Provider/Extender: [name] in Treatment: 175 Subjective History of Present Illness (HPI) The following HPI elements were documented for the patient's wound: Location: Left popliteal fossa Duration: She was aware of the wound October 2017 Context: The development/occurrence is unclear Modifying Factors: Chronic  pressure secondary to thigh pannus 10/01/16- She is here for initial evaluation of her left popliteal fossa ulcer. She states that she noticed the area in October 2017. She is unaware of actual causative factor, states she has a history of eczema and uses a brush for bathing. She was being treated by her PCP, using Aquaphor since then, and stopped using last week. She denies ever being on an antibiotic for this ulcer. She states that this drains excessively and soaks through her clothes, she is unable to visualize this area and has a friend that helps with the dressing changes. She was recently diagnosed with diabetes; A1c in December was 9; down to 7.5-8 in February. She is being managed on oral agents. She is a non-smoker. She has never been evaluated for venous reflux or lymphedema. 10/08/16- She is here in follow up regarding her left popliteal fossa pressure ulcer. She states that she did not receive any supplies as ordered. She states that the dressing material that was ordered and placed last week was in place for several days. She states that the odor and drainage is unchanged. She states that her PCP has written for 1 month off work, starting Monday 4/23. The culture obtained last week was mixed skin flora, no identifiable pathogen. 10/15/16; difficult wound  in the left popliteal fossa in the setting of morbid obesity and lymphedema. We have been using silver alginate although it is not really clear to me that this stays on for very long. She changes it once. Covering with AVD pads to try and separate wound from the surrounding pannus. A culture of this area showed both enterococcus and Enterobacter and I have her on a combination of amoxicillin and cipro. Although most Gram positives will be covered by Cipro, a big exception is Enterobacter particularly Enterobacter faecalis which generally requires ampicillin if indeed it is sensitive. A final issue is that the patient is limited by the co-pay  on her insurance with regards to actual wound care supplies. I didn'Ware get into this in much detail although he crossed my mind that we may need to bring her back more than once a week for dressing changes. 10/22/16; difficult wound in the left popliteal fossa. I think this is a lymphedema type issue in the middle lobe folds in the back of her knee. We have been using silver alginate, ABD pads. Dimensions today are improved, base of the wound is improved. I had some thoughts about biopsying this however overall this is improved today and all put that off for now 10/29/16; difficult wound in the left popliteal fossa which I think is a pressure wound from associated large folds of lymphedema. We have been using silver alginate. 11/05/16; difficult wound in the left popliteal fossa. Using silver alginate change 3x/wk. Wound measuring slightly smaller 11/12/16; difficult wound in the setting of chronic lymphedema. Left popliteal fossa. Using so over alginate change 3 times a week, once here and wants by home health and/or the patient's friend who is a Marine scientist. The dimensions of this appear to be improving, the surface is certainly improved 2/42/35; Patient now has lymphedema pumps. wound is slightly reduced in width. Still using silver alginate 11/26/16; patient has lymphedema pumps although she is having trouble with them in terms of fit. Nevertheless she tells me she is using them. She is still using silver alginate as the primary dressing 12/03/16; patient has lymphedema pumps and there is been significant improvement in the swelling in the leg. We've been using silver alginate but really only making minimal improvements if any in the overall wound area although the base of the wound is a lot better than when she first came here. 12/10/16; patient has increasing pain, drainage and odor this week. States this began to get worse over the weekend. She is been using her pumps at home and the edema is certainly better  in the leg. We changed to [facility] Blue last week. She has home health coming out once a week to change the dressing 12/17/16; culture I did last week showed a few MSSA. We called her in cephalexin at 500 4 times a day. She admits to less than 100% compliance with the 4 times a day dosing. I have encouraged her today. The surrounding tenderness and erythema in this wound is a lot better. Nursing reports that the diameter of the wound is down a centimeter. Healthy-looking wound base no surrounding tenderness. We continue to use silver alginate 12/24/16; she is still completing her antibiotics apparently not taking them exactly as prescribed. The wound is perhaps marginally smaller. Surface of this continues to look not 100% viable. We have been using silver collagen. Consider changing to Iodoflex 12/31/16; she is finished her antibiotics. There is no evidence to suspect ongoing infection in the wound. We have  looked back over the pictures of the wound in the left popliteal fossa. She came in with a thick adherent necrotic eschar and now we are dealing with slight surface slough. No debridement will be planned today. I'm going to try [facility] in lieu of the silver alginate starting today 01/07/17; patient continues to do well using Hydrofera Blue. Reliably using her external compression pumps twice a day 01/15/17; the patient has been using [facility] with a general improvement in her wound condition and size. However she went back to work yesterday it would seem that her use of the compression pumps this week is been intermittent and at work she is unable to change the dressings. Per our intake nurse arrived with a macerated dressing in place. 01/22/17 on evaluation today patient appears to be doing a little bit worse. She is having more discomfort and there's a erythema surrounding the wound bed behind her left knee. She states this discomfort often is associated with infection and in fact this  wound looks to likely be infected. She has no fevers, chills, nausea, vomiting, or diarrhea. 02/12/17; the patient's wound is not as good as I thought I would find it. She was making a good improvement when I saw her a month ago. She went back to work and largely she blames difficulties at work for this. Apparently during my absence she was treated with oral antibiotics starting on 8/3. I don'Ware believe there are any cultures 02/19/17; patient arrives in clinic today with the wound is essentially unchanged in terms of dimensions and wound appearance. We have been using silver alginate. She claims compliance with the compression pumps twice a day 02/25/17; no major change in the condition of the wound. We used Iodo flex last week with the purpose of trying to get a better-looking surface. She could not tolerate this because of discomfort. She found this especially bad when she used her compression pumps. Before this we use silver alginate again with not a lot of success. I changed her to [employer] which will certainly not have much debridement activity however I will probably change to weekly mechanical debridements. This will help with the drainage. She is now at home/on short-term disability. 03/11/17; the patient is now on disability. She is been using PolyMem AG wound for the first time looks a little better this week and dimensions have improved. She is compliant with her external compression pumps 03/18/16; the patient is now on disability at home using her palms. We've been using PolyMen AG. Wound certainly looks smaller. Arrives today with an adherent surface requiring debridement. 04/08/17; patient has not been here in 3 weeks. Using Anasept with PolyMem AG. She arrives today with an adherent surface over the wound. She did not wish debridement, there was edema fluid dripping through the wound surface even though she states she is using her external compression pumps 04/15/17; patient is using  Anasept with PolyMem and AG. She arrives today telling us that her leg is been hurting since a home care nurse staff her dressing into her on Sunday therefore she could not use her compression pumps in spite of this her dimensions appear to be better although this is a very difficult wound to measure in the popliteal fossa of the left knee 04/22/17; arrives today in clinic using Anasept and PolyMem AG. She is using her compression pumps per her description. The wound surface however once again is not going to allow healing. She still has weeping edema fluid coming  through the wound. No major change in dimensions 04/29/17; arrives today with the major deterioration in the wound both in terms of dimensions and condition of the wound surface. She reports increasing pain since earlier in the week at our intake nurse reports purulent drainage on the ABD pads.this is not going in the right direction. We put Hydrofera Blue on this last week but I'm going to change back to [employer] because of the drainage. I reviewed previous cultures last done in June showed methicillin sensitive staph aureus. We had been making progress with this wound presenting initially with a necrotic wound on the popliteal fossa on the right. We managed to get a viable surface and some reduction in wound dimensions however in the past 4 weeks this is been getting gradually worse and today much worse in terms of both size and condition of the wound surface. The patient is having pain and will require empiric antibiotics because of the purulent drainage and deterioration in the wound 05/20/17; I haven'Ware seen this patient in 3 weeks. Biopsy of this wound I did last time suggested extensive granulation tissue with collections of neutrophils lymphocytes plasma cells histiocytes and multinucleated giant cells. There was no evidence of malignancy. Deep tissue culture I did showed MSSA and I gave her Keflex when she was here last time and  renewed this for a week however she is still taking the medication I'm not certain she actually took it properly.the pathologist suggested the possibility of pyoderma gangrenosum, a vasculopathy. PAS stains and acid-fast stains were negative She also tells me that she is having mobility problems secondary to pain in both knees. She uses a walker but can barely make it to the bathroom. It sounds as though she has a history of chronic osteoarthritis and she tells me that she had a left anterior cruciate ligament tear at some time in the past 06/10/17; this is a patient that I haven'Ware seen in 3 weeks. She is completed her antibiotics. Biopsy suggested the possibility of infection versus pyoderma gangrenosum. I gave her 2 weeks of Keflex directed at [facility] [place] she is repeated this.she called last week to request more antibiotics but I wanted to see her. She arrives today with her aunt. Apparently the patient has deteriorated in terms of function. According the patient and her aunt she can no longer ambulate or easily transfer. She is incontinent because she has urge incontinence etc. They requested admission to a rehabilitation facility or the hospital and then a rehabilitation facility. I was not aware that the patient was declining in terms a shin but according to our staff that's been the case they haven'Ware seen her walk in the him any months that she's been here. I wasn'Ware really aware of this. In fact the patient was working up until the end of August. In any case she arrives with the wound a lot worse 07/01/17; this is a patient I haven'Ware seen in 3 weeks. The last time she was here at the request of her family member we sent her to the ER. She apparently was kept overnight. She was treated for UTI. Attempts were made to find her long-term care although there was insurance and cost issues and she is back home. She now has her aunt living with her. I am not clear what they are using to the wound on the posterior  left knee/popliteal fossa. Last biopsy I did of this area did not document pyoderma which had been raised by another/previous biopsy. We  are not clear today what they're actually putting on the wound 07/15/17; she arrives today with improvement in her wound dimensions although there is still weeping edema fluid. She claims she is changing the dressing [silver alginate] twice daily. I've suggested increasing this to 3 times a day. She is changing the silver alginate twice a day 07/29/17; patient complains of pain drainage and odor coming from the wound which was different from 2 weeks ago.. We have been using silver alginate she has been compliant with her compression pumps 09/02/17; the patient continues to complain of pain and drainage. I gave her empiric antibiotics when she was last here 5 weeks ago but she says she only completed them recently. She seems confused about the time frame of her last visit here.she states she was concerned about an odor and stopped using the [facility] Blue on the wound in her left popliteal fossa about a week ago. I don'Ware think she is dressing this with anything since. She is using her lymphedema pumps once a day.previous biopsy of done of this area showed inflammation associated with stasis dermatitis or trauma She is still not walking apparently because of severe osteoarthritis in both knees. She thinks she'll be going back to work in June and she needs to be ambulatory although I really can'Ware see this happening. 09/16/17; the patient is been using a mixture of Hydrofera Blue and PolyMen that she had left over. I'm not really sure how reliable this is. She tells me she is using her external compression pumps once a day. She had a steroid shot in her left knee this morning 09/30/17; 2 week follow-up for this patient with a difficult wound behind her left popliteal fossa. She has home health. They're using Hydrofera Blue. Measurement slightly smaller. She states she is using  her compression pumps once a day 10/14/17; 2 week follow-up for this woman with a difficult wound with find her left popliteal fossa. She has home health. Using Hydrofera Blue. Measurements again today slightly smaller. Most of the surface of this covered in a nonviable tightly green adherent material. Some of this may be retained Hydrofera Blue nevertheless was debrided with great difficulty. She asked me about her upcoming disability which I think happens in June. I told her that from my point of view the wound itself does not qualify her to be disabled by itself. Now I'm aware that she can no longer stand and walk. This really deteriorated quite a bit during the course of the time she is been here. I suspect this is due to severe osteoarthritis of both knees and she is seen orthopedics for this. I think this is a source of her disability but I don'Ware really feel comfortable with this given the fact that I don'Ware precisely know the exact diagnosis here. 10/28/17; 2 week follow-up for this woman with a difficult wound in her left popliteal fossa. This is complicated by lymphedema. She is episodic in using her compression pumps we obtained. We've been using Hydrofera Blue. Dimensions of the wound are not any different however surface of this looks a lot better than last time. Prior to last visit at which time I had the debridement the wound, dimensions were slightly smaller. The patient came in last time talking to me about disability. I sent her to see her primary physician who I thought would do a proper evaluation for this patient with regards to her disability thoughts. The patient is able to stand and transfer but is no  longer ambulatory. 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 . 11/08/17; this is a 2 week follow-up for patient with a difficult wound in the left popliteal fossa complicated by lymphedema. I am not sure about her compliance with the  compression pumps we've been using Hydrofera Blue for 2 months now and we have been making decent progress however once again today she arrives with 50% of the overall wound area covered in tightly adherent black/green necrotic tissue. 12/02/17; difficult wound in the left popliteal fossa complicated by severe lymphedema. We've been using silver alginate although the patient ran out of this and she's been using Hydrofera Blue. Last time she is here she had a considerable amount of tightly adherent green necrotic tissue. Post debridement culture showed MSSA, I gave her 7 days worth of Septra DS which seems to have helped 12/24/17; almost palliative follow-up for this difficult area on the left popliteal fossa complicated by severe lymphedema. We've been using silver alginate although the patient tells me she's been using Hydrofera Blue. She does not separate the folds of this wound. She has been using her lymphedema pumps but not every day. 01/20/18 This is a difficult wound in the left popliteal fossa. Intake nurse noted some purulent drainage and necrotic material. She has severe lymphedema including the posterior thigh and the upper calf surrounding this wound and there is continued leaking edema fluid out of the wound surface. This is not going to heal like this. She states she is being compliant with the lymphedema pumps once sometimes twice a day. However this is not controlling the edema around the wound either in the calf or the posterior thigh 02/10/18; really not making any progress here. The wound is actually larger. There is no purulent drainage. Culture I did last time was negative/multiple organisms 03/10/18; 1 month follow-up. The wound may be measures slightly smaller. We've been using care max directly on the wound surface. The area definitely looks less moist. She tells me she is also you been using her pumps religiously twice a day. In fact the area actually looks dry today. 04/12/2018; 1  month follow-up. She states after I used silver nitrate on this wound area a month ago that the pain was intolerable and continued all month. This made it too painful for her to use the care of max reliably or to use her compression pumps. She arrives in clinic today with her wound probably twice the size. There is no purulent drainage 05/03/2018; the patient has been using her pumps reliably twice a day over the last week but she finds the combination of the pump and a dressing on the wound to be unduly painful therefore there is not been a dressing in the last week. She arrives in clinic today with the wound continuing to enlarge. She did not tolerate silver nitrate nor Kerramax directly on the wound 05/26/2018; the patient has not been using her pumps. Her wound in the left posterior popliteal fossa continues to expand. We are using silver alginate although the options for primary dressings are limited since she is paying out-of-pocket for this. Culture I did last time which was a PCR culture showed a scattered number of anaerobes low quantities. I did not think this was significant. 06/09/2018 the patient has not been using her pumps. She puts care of max in the area on the posterior popliteal fossa. She has no one to help her clean this area.  Home health is apparently coming by once a week. For some reason she has to purchase a lot of her supplies herself online. She does not have a lot of financial resources. She states the pumps hurt her and caused the wound to bleed. She is continuing to deteriorate Biopsy I did last visit did not show malignancy/fungus or atypical Mycobacterium. This is a second time I have biopsied this 06/30/2018; the patient states she is using her pumps at least once a day. She put silver alginate and a rolled towel in the area. She occasionally uses kerramax and/or ABD pads. She is decided that a application of silver nitrate I did because of very friable mucosa on this  sometime in September is the reason that she could not use the pumps. I did not actually research this. This is clearly not the case 1/30; the patient states she is using her pumps once a day but sometimes for as long as 2 hours. I told her that she still needs to do this and additional time a day. She is using silver alginate. She states the [facility] hurts when she uses her pumps therefore she is not using this. She has ABD pads that she is getting from home health. Dimensions perhaps slightly improved 3/5; the patient uses her pumps once a day but she states for is sometimes as long as 2 hours. She has not managed to do this twice a day. She is using silver alginate. Separating these areas with ABDs and rolled towels. There is no change in dimensions of this wound if anything slightly larger although the surfaces look better 4/2; I do not see too much difference 1 way or the other in the area on the left popliteal fossa. She is not using her pumps. She did describe an episode of pain and odor a week or 2 ago however she said it is mostly subsided. Still using silver alginate. 5/8; monthly follow-up for this large area in the left popliteal fossa. She is not using her compression pumps there is surrounding lymphedema from tissue in the posterior thigh in the posterior calf. Her home health nurse had called 2 weeks ago to report black drainage on the surface, she used silver alginate after that and it seems to have cleared up. 6/4; Patient is not using pumps. Finds silver alginate painful. I think she is just using ABD's in the area. She has a new wound laterally to the original wound. Leaking copious lymphedema 7/9; monthly follow-up. Patient is using her pumps about once a week finds it painful. She uses some combination of silver calcium alginate. She has to buy her own supplies [place] and she uses an ABD. She has home health but her co-pay is such that it is cheaper for her to buy her supplies  online 8/6-Patient is here for her monthly follow-up, she has started using her pumps again, apparently a couple of weeks ago she had a lot of pain and could not use the pumps, she is now using silver alginate, she states her left posterior calf wound had some drainage that was greenish now converted to serous 10/13; 54-month follow-up. The patient's wound is on the left popliteal fossa. Fairly substantial area in the middle of 2 pannus folds the lower part of her thigh and the upper part of her calf. She has been using silver alginate. As usual she presents with greenish drainage but the wound does not look infected 12/8; 50-month follow-up. The patient now has  2 wounds in the popliteal fossa on the left. Fairly substantial area in the middle of 2 pannus folds in the lower part of her thigh and the upper part of her calf. She has been using silver alginate. There is a smaller wound laterally and posteriorly very difficult to see. The patient says she is using her compression pumps 3 times a week. She is not willing to separate the folds in this wound area. I am not sure I really understand the issue. 2/23; 68-month follow-up. I did not see a second wound this time. But otherwise I think the area on her left popliteal fossa is unchanged. The wound is between 2 fairly substantial pannus fold in the lower part of her thigh and the upper part of her calf. She has been using silver alginate which she buys online herself. She asks me about different dressings suggested by home health. She is not using her compression pumps and she does not keep these pannus fold separated. 4/20; I follow this woman palliatively on a 10-month basis. She has a chronic wound on the left popliteal fossa. This has been there for 4 years between 2 fairly substantial pannus folds on the lower part of her thigh and the upper part of her calf this drains weeping edema fluid. I biopsied this area on 2 different occasions there was no  malignancy. She has external compression pumps but probably uses them maximum of 4 times a week. She is technically homebound but able to transfer herself from bed to wheelchair etc. We have been using silver alginate for a long period of time Our intake nurses reported a greenish drainage 6/15; palliative wound on the left popliteal fossa. This is been there for several years as noted. She has 2 fairly substantial pannus areas that encompasses 1 from the posterior thigh and the other from the upper calf. She has compression pumps that we ordered for her early on she does not use them there is always a reason today it has she had to replace her lift chair. We have been using silver alginate She has [facility] home health and there are reports that she is having bleeding from certain areas of this wound. Not exactly certain where but it apparently pools around her foot this is happened 4 times since she was here last 2 months ago 8/24; 8-month follow-up for chronic wound on the left popliteal fossa. This is in the setting of severe lymphedema. The patient tells me she has recently gotten a lift chair and is going to try to use her compression pumps again. We have been using silver alginate with ABDs to the wounds for a prolonged period of time. The patient is basically confined to home now. She moves around mostly by pushing herself in a seated walker. She is able to stand and transfer perhaps take a few steps. Miraculously she is managing to hang on at home Objective Constitutional Sitting or standing Blood Pressure is within target range for patient.. Pulse regular and within target range for patient.Marland Kitchen Respirations regular, non-labored and within target range.. Temperature is normal and within the target range for the patient.Marland Kitchen Appears in no distress. Vitals Time Taken: 2:39 PM, Height: 70 in, Source: Stated, Weight: 484 lbs, Source: Stated, BMI: 69.4, Temperature: 98.4 F, Pulse: 132 bpm,  Respiratory Rate: 20 breaths/min, Blood Pressure: 132/70 mmHg. Cardiovascular Bilateral lymphedema. Massive edema in the left posterior thigh is noted. General Notes: Wound exam; the patient has the same substantial looking wound in  the left popliteal fossa. There is rims of epithelialization superiorly here but generally this is not an area of its going to heal. Under illumination there is some debris on the surface. No debridement was done. She has a colossal amount of edema in her posterior thigh which I think is lymphedema. Integumentary (Hair, Skin) No evidence of infection around the wound. Wound #1 status is Open. Original cause of wound was Gradually Appeared. The wound is located on the Left,Posterior Knee. The wound measures 5cm length x 9.5cm width x 0.4cm depth; 37.306cm^2 area and 14.923cm^3 volume. There is Fat Layer (Subcutaneous Tissue) exposed. There is no tunneling or undermining noted. There is a large amount of serosanguineous drainage noted. The wound margin is flat and intact. There is large (67-100%) **red granulation within the wound bed. There is a small (1-33%) amount of necrotic tissue within the wound bed including Adherent Slough. Assessment Active Problems ICD-10 Chronic venous hypertension (idiopathic) with inflammation of left lower extremity Lymphedema, not elsewhere classified Non-pressure chronic ulcer of left calf limited to breakdown of skin Type 2 diabetes mellitus with other skin ulcer Other obesity due to excess calories Plan Follow-up Appointments: Other: - 2 months Dressing Change Frequency: Wound #1 Left,Posterior Knee: Change dressing every day. Wound Cleansing: May shower and wash wound with soap and water. - also cleanse with Vashe solution Primary Wound Dressing: Wound #1 Left,Posterior Knee: Calcium Alginate with Silver Secondary Dressing: Wound #1 Left,Posterior Knee: Kerlix/Rolled Gauze ABD pad - or incontinence pads secure with  transpore tape Edema Control: Avoid standing for long periods of time Elevate legs to the level of the heart or above for 30 minutes daily and/or when sitting, a frequency of: - throughout the day Exercise regularly Segmental Compressive Device. - lymphedema pumps 60 minutes 2 times per day! Off-Loading: Other: - place rolled towel or washcloth in skin fold behind left knee to separate skin in fold Additional Orders / Instructions: Follow Nutritious Diet - increase protein and vegetables to aid in wound healing. Home Health: [facility]. - [place]. 3 times per week; patient is homebound. #1 I do not see a reason to change the dressing. Although the wound is no better it certainly no worse. Also she has to pay for her dressings out-of-pocket and there is a substantial wound area here. 2. She still has home health that come in and assess the wound 3. I do not think this would ever have a chance of healing unless something is done to control the swelling in the left posterior thigh which I think is lymphedema. I have never been able to get her to use the external compression pumps reliably 4. Patient is very disabled at this point and in talking to her I wonder how much longer she is going to be able to continue to live in the reasonably independent setting where she is living. She does not ambulate any more than 2 steps. She uses a seated walker in order to push herself around with her legs. Electronic Signature(s) Signed: 02/15/2020 6:08:30 PM By: Linton Ham MD Entered By: Linton Ham on 02/13/2020 15:46:50 -------------------------------------------------------------------------------- SuperBill Details Patient Name: Date of Service: Debe Coder 02/13/2020 Medical Record Number: [id number] Patient Account Number: [id number] Date of Birth/Sex: Treating RN: [id number] (54 y.o. [name] Primary Care Provider: Fanny Ware Other Clinician: Referring Provider: Treating Provider/Extender: [name] in Treatment: 175 Diagnosis Coding ICD-10 Codes Code Description  X48.016 Chronic venous hypertension (idiopathic) with inflammation of left lower extremity I89.0 Lymphedema, not elsewhere classified L97.221 Non-pressure chronic ulcer of left calf limited to breakdown of skin E11.622 Type 2 diabetes mellitus with other skin ulcer E66.09 Other obesity due to excess calories Facility Procedures CPT4 Code: [id number] Description: 9213 - WOUND CARE VISIT-LEV 3 EST PT Modifier: Quantity: 1 Physician Procedures : CPT4 Code Description Modifier [id number] - WC PHYS LEVEL 3 - EST PT ICD-10 Diagnosis Description I87.322 Chronic venous hypertension (idiopathic) with inflammation of left lower extremity I89.0 Lymphedema, not elsewhere classified L97.221  Non-pressure chronic ulcer of left calf limited to breakdown of skin Quantity: 1 Electronic Signature(s) Signed: 02/15/2020 6:08:30 PM By: Linton Ham MD Entered By: Linton Ham on 02/13/2020 15:47:13

## 2020-04-15 ENCOUNTER — Encounter (HOSPITAL_BASED_OUTPATIENT_CLINIC_OR_DEPARTMENT_OTHER): Payer: Medicare PPO | Admitting: Internal Medicine

## 2020-04-23 ENCOUNTER — Encounter (HOSPITAL_BASED_OUTPATIENT_CLINIC_OR_DEPARTMENT_OTHER): Payer: Medicare PPO | Admitting: Internal Medicine

## 2020-04-30 ENCOUNTER — Other Ambulatory Visit: Payer: Self-pay

## 2020-04-30 ENCOUNTER — Encounter (HOSPITAL_BASED_OUTPATIENT_CLINIC_OR_DEPARTMENT_OTHER): Payer: Medicare PPO | Attending: Internal Medicine | Admitting: Internal Medicine

## 2020-04-30 DIAGNOSIS — I890 Lymphedema, not elsewhere classified: Secondary | ICD-10-CM | POA: Diagnosis not present

## 2020-04-30 DIAGNOSIS — Z6841 Body Mass Index (BMI) 40.0 and over, adult: Secondary | ICD-10-CM | POA: Insufficient documentation

## 2020-04-30 DIAGNOSIS — E6609 Other obesity due to excess calories: Secondary | ICD-10-CM | POA: Insufficient documentation

## 2020-04-30 DIAGNOSIS — E11622 Type 2 diabetes mellitus with other skin ulcer: Secondary | ICD-10-CM | POA: Diagnosis present

## 2020-04-30 DIAGNOSIS — L97221 Non-pressure chronic ulcer of left calf limited to breakdown of skin: Secondary | ICD-10-CM | POA: Diagnosis not present

## 2020-05-01 NOTE — Progress Notes (Signed)
[name], [name] ([id number]) . Visit Report for 04/30/2020 HPI Details Patient Name: Date of Service: MCA Ware, Sierra 04/30/2020 2:45 PM Medical Record Number: [id number] Patient Account Number: [id number] Date of Birth/Sex: Treating RN: 07-20-1965 (54 y.o. [name] Primary Care Provider: Fanny Bien Other Clinician: Referring Provider: Treating Provider/Extender: Scheryl Marten in Treatment: 186 History of Present Illness Location: Left popliteal fossa Duration: She was aware of the wound October 2017 Context: The development/occurrence is unclear Modifying Factors: Chronic pressure secondary to thigh pannus HPI Description: 10/01/16- She is here for initial evaluation of her left popliteal fossa ulcer. She states that she noticed the area in October 2017. She is unaware of actual causative factor, states she has a history of eczema and uses a brush for bathing. She was being treated by her PCP, using Aquaphor since then, and stopped using last week. She denies ever being on an antibiotic for this ulcer. She states that this drains excessively and soaks through her clothes, she is unable to visualize this area and has a friend that helps with the dressing changes. She was recently diagnosed with diabetes; A1c in December was 9; down to 7.5-8 in February. She is being managed on oral agents. She is a non-smoker. She has never been evaluated for venous reflux or lymphedema. 10/08/16- She is here in follow up regarding her left popliteal fossa pressure ulcer. She states that she did not receive any supplies as ordered. She states that the dressing material that was ordered and placed last week was in place for several days. She states that the odor and drainage is unchanged. She states that her PCP has written for 1 month off work, starting Monday 4/23. The culture obtained last week was mixed skin flora, no identifiable pathogen. 10/15/16;  difficult wound in the left popliteal fossa in the setting of morbid obesity and lymphedema. We have been using silver alginate although it is not really clear to me that this stays on for very long. She changes it once. Covering with AVD pads to try and separate wound from the surrounding pannus. A culture of this area showed both enterococcus and Enterobacter and I have her on a combination of amoxicillin and cipro. Although most Gram positives will be covered by Cipro, a big exception is Enterobacter particularly Enterobacter faecalis which generally requires ampicillin if indeed it is sensitive. A final issue is that the patient is limited by the co-pay on her insurance with regards to actual wound care supplies. I didn'Ware get into this in much detail although he crossed my mind that we may need to bring her back more than once a week for dressing changes. 10/22/16; difficult wound in the left popliteal fossa. I think this is a lymphedema type issue in the middle lobe folds in the back of her knee. We have been using silver alginate, ABD pads. Dimensions today are improved, base of the wound is improved. I had some thoughts about biopsying this however overall this is improved today and all put that off for now 10/29/16; difficult wound in the left popliteal fossa which I think is a pressure wound from associated large folds of lymphedema. We have been using silver alginate. 11/05/16; difficult wound in the left popliteal fossa. Using silver alginate change 3x/wk. Wound measuring slightly smaller 11/12/16; difficult wound in the setting of chronic lymphedema. Left popliteal fossa. Using so over alginate change 3 times a week, once here and wants by home health and/or  the patient's friend who is a Marine scientist. The dimensions of this appear to be improving, the surface is certainly improved 0/93/26; Patient now has lymphedema pumps. wound is slightly reduced in width. Still using silver alginate 11/26/16; patient  has lymphedema pumps although she is having trouble with them in terms of fit. Nevertheless she tells me she is using them. She is still using silver alginate as the primary dressing 12/03/16; patient has lymphedema pumps and there is been significant improvement in the swelling in the leg. We've been using silver alginate but really only making minimal improvements if any in the overall wound area although the base of the wound is a lot better than when she first came here. 12/10/16; patient has increasing pain, drainage and odor this week. States this began to get worse over the weekend. She is been using her pumps at home and the edema is certainly better in the leg. We changed to [facility] Blue last week. She has home health coming out once a week to change the dressing 12/17/16; culture I did last week showed a few MSSA. We called her in cephalexin at 500 4 times a day. She admits to less than 100% compliance with the 4 times a day dosing. I have encouraged her today. The surrounding tenderness and erythema in this wound is a lot better. Nursing reports that the diameter of the wound is down a centimeter. Healthy-looking wound base no surrounding tenderness. We continue to use silver alginate 12/24/16; she is still completing her antibiotics apparently not taking them exactly as prescribed. The wound is perhaps marginally smaller. Surface of this continues to look not 100% viable. We have been using silver collagen. Consider changing to Iodoflex 12/31/16; she is finished her antibiotics. There is no evidence to suspect ongoing infection in the wound. We have looked back over the pictures of the wound in the left popliteal fossa. She came in with a thick adherent necrotic eschar and now we are dealing with slight surface slough. No debridement will be planned today. I'm going to try [facility] in lieu of the silver alginate starting today 01/07/17; patient continues to do well using Hydrofera Blue.  Reliably using her external compression pumps twice a day 01/15/17; the patient has been using [facility] with a general improvement in her wound condition and size. However she went back to work yesterday it would seem that her use of the compression pumps this week is been intermittent and at work she is unable to change the dressings. Per our intake nurse arrived with a macerated dressing in place. 01/22/17 on evaluation today patient appears to be doing a little bit worse. She is having more discomfort and there's a erythema surrounding the wound bed behind her left knee. She states this discomfort often is associated with infection and in fact this wound looks to likely be infected. She has no fevers, chills, nausea, vomiting, or diarrhea. 02/12/17; the patient's wound is not as good as I thought I would find it. She was making a good improvement when I saw her a month ago. She went back to work and largely she blames difficulties at work for this. Apparently during my absence she was treated with oral antibiotics starting on 8/3. I don'Ware believe there are any cultures 02/19/17; patient arrives in clinic today with the wound is essentially unchanged in terms of dimensions and wound appearance. We have been using silver alginate. She claims compliance with the compression pumps twice a day 02/25/17; no  major change in the condition of the wound. We used Iodo flex last week with the purpose of trying to get a better-looking surface. She could not tolerate this because of discomfort. She found this especially bad when she used her compression pumps. Before this we use silver alginate again with not a lot of success. I changed her to [employer] which will certainly not have much debridement activity however I will probably change to weekly mechanical debridements. This will help with the drainage. She is now at home/on short-term disability. 03/11/17; the patient is now on disability. She is been using  PolyMem AG wound for the first time looks a little better this week and dimensions have improved. She is compliant with her external compression pumps 03/18/16; the patient is now on disability at home using her palms. We've been using PolyMen AG. Wound certainly looks smaller. Arrives today with an adherent surface requiring debridement. 04/08/17; patient has not been here in 3 weeks. Using Anasept with PolyMem AG. She arrives today with an adherent surface over the wound. She did not wish debridement, there was edema fluid dripping through the wound surface even though she states she is using her external compression pumps 04/15/17; patient is using Anasept with PolyMem and AG. She arrives today telling us that her leg is been hurting since a home care nurse staff her dressing into her on Sunday therefore she could not use her compression pumps in spite of this her dimensions appear to be better although this is a very difficult wound to measure in the popliteal fossa of the left knee 04/22/17; arrives today in clinic using Anasept and PolyMem AG. She is using her compression pumps per her description. The wound surface however once again is not going to allow healing. She still has weeping edema fluid coming through the wound. No major change in dimensions 04/29/17; arrives today with the major deterioration in the wound both in terms of dimensions and condition of the wound surface. She reports increasing pain since earlier in the week at our intake nurse reports purulent drainage on the ABD pads.this is not going in the right direction. We put Hydrofera Blue on this last week but I'm going to change back to [employer] because of the drainage. I reviewed previous cultures last done in June showed methicillin sensitive staph aureus. We had been making progress with this wound presenting initially with a necrotic wound on the popliteal fossa on the right. We managed to get a viable surface and some  reduction in wound dimensions however in the past 4 weeks this is been getting gradually worse and today much worse in terms of both size and condition of the wound surface. The patient is having pain and will require empiric antibiotics because of the purulent drainage and deterioration in the wound 05/20/17; I haven'Ware seen this patient in 3 weeks. Biopsy of this wound I did last time suggested extensive granulation tissue with collections of neutrophils lymphocytes plasma cells histiocytes and multinucleated giant cells. There was no evidence of malignancy. Deep tissue culture I did showed MSSA and I gave her Keflex when she was here last time and renewed this for a week however she is still taking the medication I'm not certain she actually took it properly.the pathologist suggested the possibility of pyoderma gangrenosum, a vasculopathy. PAS stains and acid-fast stains were negative She also tells me that she is having mobility problems secondary to pain in both knees. She uses a walker but can  barely make it to the bathroom. It sounds as though she has a history of chronic osteoarthritis and she tells me that she had a left anterior cruciate ligament tear at some time in the past 06/10/17; this is a patient that I haven'Ware seen in 3 weeks. She is completed her antibiotics. Biopsy suggested the possibility of infection versus pyoderma gangrenosum. I gave her 2 weeks of Keflex directed at [facility] she is repeated this.she called last week to request more antibiotics but I wanted to see her. She arrives today with her aunt. Apparently the patient has deteriorated in terms of function. According the patient and her aunt she can no longer ambulate or easily transfer. She is incontinent because she has urge incontinence etc. They requested admission to a rehabilitation facility or the hospital and then a rehabilitation facility. I was not aware that the patient was declining in terms a shin but according to  our staff that's been the case they haven'Ware seen her walk in the him any months that she's been here. I wasn'Ware really aware of this. In fact the patient was working up until the end of August. In any case she arrives with the wound a lot worse 07/01/17; this is a patient I haven'Ware seen in 3 weeks. The last time she was here at the request of her family member we sent her to the ER. She apparently was kept overnight. She was treated for UTI. Attempts were made to find her long-term care although there was insurance and cost issues and she is back home. She now has her aunt living with her. I am not clear what they are using to the wound on the posterior left knee/popliteal fossa. Last biopsy I did of this area did not document pyoderma which had been raised by another/previous biopsy. We are not clear today what they're actually putting on the wound 07/15/17; she arrives today with improvement in her wound dimensions although there is still weeping edema fluid. She claims she is changing the dressing [silver alginate] twice daily. I've suggested increasing this to 3 times a day. She is changing the silver alginate twice a day 07/29/17; patient complains of pain drainage and odor coming from the wound which was different from 2 weeks ago.. We have been using silver alginate she has been compliant with her compression pumps 09/02/17; the patient continues to complain of pain and drainage. I gave her empiric antibiotics when she was last here 5 weeks ago but she says she only completed them recently. She seems confused about the time frame of her last visit here.she states she was concerned about an odor and stopped using the [facility] Blue on the wound in her left popliteal fossa about a week ago. I don'Ware think she is dressing this with anything since. She is using her lymphedema pumps once a day.previous biopsy of done of this area showed inflammation associated with stasis dermatitis or trauma She is still  not walking apparently because of severe osteoarthritis in both knees. She thinks she'll be going back to work in June and she needs to be ambulatory although I really can'Ware see this happening. 09/16/17; the patient is been using a mixture of Hydrofera Blue and PolyMen that she had left over. I'm not really sure how reliable this is. She tells me she is using her external compression pumps once a day. She had a steroid shot in her left knee this morning 09/30/17; 2 week follow-up for this patient with a difficult  wound behind her left popliteal fossa. She has home health. They're using Hydrofera Blue. Measurement slightly smaller. She states she is using her compression pumps once a day 10/14/17; 2 week follow-up for this woman with a difficult wound with find her left popliteal fossa. She has home health. Using Hydrofera Blue. Measurements again today slightly smaller. Most of the surface of this covered in a nonviable tightly green adherent material. Some of this may be retained Hydrofera Blue nevertheless was debrided with great difficulty. She asked me about her upcoming disability which I think happens in June. I told her that from my point of view the wound itself does not qualify her to be disabled by itself. Now I'm aware that she can no longer stand and walk. This really deteriorated quite a bit during the course of the time she is been here. I suspect this is due to severe osteoarthritis of both knees and she is seen orthopedics for this. I think this is a source of her disability but I don'Ware really feel comfortable with this given the fact that I don'Ware precisely know the exact diagnosis here. 10/28/17; 2 week follow-up for this woman with a difficult wound in her left popliteal fossa. This is complicated by lymphedema. She is episodic in using her compression pumps we obtained. We've been using Hydrofera Blue. Dimensions of the wound are not any different however surface of this looks a lot  better than last time. Prior to last visit at which time I had the debridement the wound, dimensions were slightly smaller. The patient came in last time talking to me about disability. I sent her to see her primary physician who I thought would do a proper evaluation for this patient with regards to her disability thoughts. The patient is able to stand and transfer but is no longer ambulatory. 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 . 11/08/17; this is a 2 week follow-up for patient with a difficult wound in the left popliteal fossa complicated by lymphedema. I am not sure about her compliance with the compression pumps we've been using Hydrofera Blue for 2 months now and we have been making decent progress however once again today she arrives with 50% of the overall wound area covered in tightly adherent black/green necrotic tissue. 12/02/17; difficult wound in the left popliteal fossa complicated by severe lymphedema. We've been using silver alginate although the patient ran out of this and she's been using Hydrofera Blue. Last time she is here she had a considerable amount of tightly adherent green necrotic tissue. Post debridement culture showed MSSA, I gave her 7 days worth of Septra DS which seems to have helped 12/24/17; almost palliative follow-up for this difficult area on the left popliteal fossa complicated by severe lymphedema. We've been using silver alginate although the patient tells me she's been using Hydrofera Blue. She does not separate the folds of this wound. She has been using her lymphedema pumps but not every day. 01/20/18 This is a difficult wound in the left popliteal fossa. Intake nurse noted some purulent drainage and necrotic material. She has severe lymphedema including the posterior thigh and the upper calf surrounding this wound and there is continued leaking edema fluid out of the wound surface. This is not going to heal  like this. She states she is being compliant with the lymphedema pumps once sometimes twice a day. However this is not controlling the edema around the wound  either in the calf or the posterior thigh 02/10/18; really not making any progress here. The wound is actually larger. There is no purulent drainage. Culture I did last time was negative/multiple organisms 03/10/18; 1 month follow-up. The wound may be measures slightly smaller. We've been using care max directly on the wound surface. The area definitely looks less moist. She tells me she is also you been using her pumps religiously twice a day. In fact the area actually looks dry today. 04/12/2018; 1 month follow-up. She states after I used silver nitrate on this wound area a month ago that the pain was intolerable and continued all month. This made it too painful for her to use the care of max reliably or to use her compression pumps. She arrives in clinic today with her wound probably twice the size. There is no purulent drainage 05/03/2018; the patient has been using her pumps reliably twice a day over the last week but she finds the combination of the pump and a dressing on the wound to be unduly painful therefore there is not been a dressing in the last week. She arrives in clinic today with the wound continuing to enlarge. She did not tolerate silver nitrate nor Kerramax directly on the wound 05/26/2018; the patient has not been using her pumps. Her wound in the left posterior popliteal fossa continues to expand. We are using silver alginate although the options for primary dressings are limited since she is paying out-of-pocket for this. Culture I did last time which was a PCR culture showed a scattered number of anaerobes low quantities. I did not think this was significant. 06/09/2018 the patient has not been using her pumps. She puts care of max in the area on the posterior popliteal fossa. She has no one to help her clean this area. Home  health is apparently coming by once a week. For some reason she has to purchase a lot of her supplies herself online. She does not have a lot of financial resources. She states the pumps hurt her and caused the wound to bleed. She is continuing to deteriorate Biopsy I did last visit did not show malignancy/fungus or atypical Mycobacterium. This is a second time I have biopsied this 06/30/2018; the patient states she is using her pumps at least once a day. She put silver alginate and a rolled towel in the area. She occasionally uses kerramax and/or ABD pads. She is decided that a application of silver nitrate I did because of very friable mucosa on this sometime in September is the reason that she could not use the pumps. I did not actually research this. This is clearly not the case 1/30; the patient states she is using her pumps once a day but sometimes for as long as 2 hours. I told her that she still needs to do this and additional time a day. She is using silver alginate. She states the [facility] hurts when she uses her pumps therefore she is not using this. She has ABD pads that she is getting from home health. Dimensions perhaps slightly improved 3/5; the patient uses her pumps once a day but she states for is sometimes as long as 2 hours. She has not managed to do this twice a day. She is using silver alginate. Separating these areas with ABDs and rolled towels. There is no change in dimensions of this wound if anything slightly larger although the surfaces look better 4/2; I do not see too much difference 1 way  or the other in the area on the left popliteal fossa. She is not using her pumps. She did describe an episode of pain and odor a week or 2 ago however she said it is mostly subsided. Still using silver alginate. 5/8; monthly follow-up for this large area in the left popliteal fossa. She is not using her compression pumps there is surrounding lymphedema from tissue in the posterior thigh in  the posterior calf. Her home health nurse had called 2 weeks ago to report black drainage on the surface, she used silver alginate after that and it seems to have cleared up. 6/4; Patient is not using pumps. Finds silver alginate painful. I think she is just using ABD's in the area. She has a new wound laterally to the original wound. Leaking copious lymphedema 7/9; monthly follow-up. Patient is using her pumps about once a week finds it painful. She uses some combination of silver calcium alginate. She has to buy her own supplies [place] and she uses an ABD. She has home health but her co-pay is such that it is cheaper for her to buy her supplies online 8/6-Patient is here for her monthly follow-up, she has started using her pumps again, apparently a couple of weeks ago she had a lot of pain and could not use the pumps, she is now using silver alginate, she states her left posterior calf wound had some drainage that was greenish now converted to serous 10/13; 83-monthfollow-up. The patient's wound is on the left popliteal fossa. Fairly substantial area in the middle of 2 pannus folds the lower part of her thigh and the upper part of her calf. She has been using silver alginate. As usual she presents with greenish drainage but the wound does not look infected 12/8; 271-monthollow-up. The patient now has 2 wounds in the popliteal fossa on the left. Fairly substantial area in the middle of 2 pannus folds in the lower part of her thigh and the upper part of her calf. She has been using silver alginate. There is a smaller wound laterally and posteriorly very difficult to see. The patient says she is using her compression pumps 3 times a week. She is not willing to separate the folds in this wound area. I am not sure I really understand the issue. 2/23; 2-52-monthllow-up. I did not see a second wound this time. But otherwise I think the area on her left popliteal fossa is unchanged. The wound is between  2 fairly substantial pannus fold in the lower part of her thigh and the upper part of her calf. She has been using silver alginate which she buys online herself. She asks me about different dressings suggested by home health. She is not using her compression pumps and she does not keep these pannus fold separated. 4/20; I follow this woman palliatively on a 2-m60-monthis. She has a chronic wound on the left popliteal fossa. This has been there for 4 years between 2 fairly substantial pannus folds on the lower part of her thigh and the upper part of her calf this drains weeping edema fluid. I biopsied this area on 2 different occasions there was no malignancy. She has external compression pumps but probably uses them maximum of 4 times a week. She is technically homebound but able to transfer herself from bed to wheelchair etc. We have been using silver alginate for a long period of time Our intake nurses reported a greenish drainage 6/15; palliative wound on the  left popliteal fossa. This is been there for several years as noted. She has 2 fairly substantial pannus areas that encompasses 1 from the posterior thigh and the other from the upper calf. She has compression pumps that we ordered for her early on she does not use them there is always a reason today it has she had to replace her lift chair. We have been using silver alginate She has [facility] home health and there are reports that she is having bleeding from certain areas of this wound. Not exactly certain where but it apparently pools around her foot this is happened 4 times since she was here last 2 months ago 8/24; 81-monthfollow-up for chronic wound on the left popliteal fossa. This is in the setting of severe lymphedema. The patient tells me she has recently gotten a lift chair and is going to try to use her compression pumps again. We have been using silver alginate with ABDs to the wounds for a prolonged period of time. The patient is  basically confined to home now. She moves around mostly by pushing herself in a seated walker. She is able to stand and transfer perhaps take a few steps. Miraculously she is managing to hang on at home 11/9; this is a patient with a chronic wound and a pannus fold in her left popliteal fossa. She has massive lymphedema. We managed to get her compression pumps but she does not really use them largely related to musculoskeletal pain. She says she is getting a lift chair fixed and she may be able to use the pumps after this. She treats the wound with silver alginate and ABD pads. She purchased this herself on line "[employer] she has home health. When I first met this patient she was ambulatory and working she is no longer working and really minimally ambulatory. Electronic Signature(s) Signed: 05/01/2020 9:36:19 AM By: RLinton HamMD Entered By: RLinton Hamon 05/01/2020 08:11:50 -------------------------------------------------------------------------------- Physical Exam Details Patient Name: Date of Service: MDebe Coder11/02/2020 2:45 PM Medical Record Number: [id number] Account Number: [id number] of Birth/Sex: Treating RN: [id number](54y.o. FOrvan FalconerPrimary Care Provider: DFanny BienOther Clinician: Referring Provider: Treating Provider/Extender: RScheryl Martenin Treatment: 186 Constitutional Patient is hypertensive.. Pulse regular and within target range for patient..Marland KitchenRespirations regular, non-labored and within target range.. Temperature is normal and within the target range for the patient..Marland KitchenAppears in no distress. Respiratory work of breathing is normal. Cardiovascular Massive lymphedema in the left thigh. The wound in the popliteal fossa is between massive edema above it and some degree of nonpitting at that lymphedema below it. This is simply not going to heal like this.. Notes Wound exam; the wound itself  does not look too much different to me. The tissue on the surface does not look too bad. There was some reports of slight green drainage from apparently the home health nurse to the patient but I could not see any of this. I wash this off with Anasept and gauze. She is apparently not able to transfer into our examining tables. It was very difficult if not impossible for me to see this wound in its totality. I really did not see any evidence of infection Electronic Signature(s) Signed: 05/01/2020 9:36:19 AM By: RLinton HamMD Entered By: RLinton Hamon 05/01/2020 08:13:31 -------------------------------------------------------------------------------- Physician Orders Details Patient Name: Date of Service: MCA LMichail JewelsT. 04/30/2020 2:45 PM Medical Record Number: [id number] Account Number:  [id number] Date of Birth/Sex: Treating RN: [birth year] (54 y.o. [name] Primary Care Provider: Fanny Bien Other Clinician: Referring Provider: Treating Provider/Extender: Scheryl Marten in Treatment: [phone] Verbal / Phone Orders: No Diagnosis Coding ICD-10 Coding Code Description I87.322 Chronic venous hypertension (idiopathic) with inflammation of left lower extremity I89.0 Lymphedema, not elsewhere classified L97.221 Non-pressure chronic ulcer of left calf limited to breakdown of skin E11.622 Type 2 diabetes mellitus with other skin ulcer E66.09 Other obesity due to excess calories Follow-up Appointments Other: - 2 months Dressing Change Frequency Wound #1 Left,Posterior Knee Change dressing three times week. Wound Cleansing May shower and wash wound with soap and water. - also cleanse with Vashe solution Primary Wound Dressing Wound #1 Left,Posterior Knee Calcium Alginate with Silver Secondary Dressing Wound #1 Left,Posterior Knee Kerlix/Rolled Gauze ABD pad - or incontinence pads secure with transpore tape Edema Control Avoid  standing for long periods of time Elevate legs to the level of the heart or above for 30 minutes daily and/or when sitting, a frequency of: - throughout the day Exercise regularly Segmental Compressive Device. - lymphedema pumps 60 minutes 2 times per day! Off-Loading Other: - place rolled towel or washcloth in skin fold behind left knee to separate skin in fold Additional Orders / Instructions Follow Nutritious Diet - increase protein and vegetables to aid in wound healing. [place] skilled nursing for wound care. - [place]. 3 times per week; patient is homebound. Electronic Signature(s) Signed: 05/01/2020 9:36:19 AM By: Linton Ham MD Signed: 05/01/2020 5:51:14 PM By: Carlene Coria RN Entered By: Carlene Coria on 04/30/2020 15:31:45 -------------------------------------------------------------------------------- Problem List Details Patient Name: Date of Service: MCA Michail Jewels Ware. 04/30/2020 2:45 PM Medical Record Number: [id number] Patient Account Number: [id number] Date of Birth/Sex: Treating RN: June 28, 1965 (54 y.o. [name] Primary Care Provider: Fanny Bien Other Clinician: Referring Provider: Treating Provider/Extender: Scheryl Marten in Treatment: [phone] Active Problems ICD-10 Encounter Code Description Active Date MDM Diagnosis I87.322 Chronic venous hypertension (idiopathic) with inflammation of left lower 11/05/2016 No Yes extremity I89.0 Lymphedema, not elsewhere classified 10/09/2016 No Yes L97.221 Non-pressure chronic ulcer of left calf limited to breakdown of skin 11/05/2016 No Yes E11.622 Type 2 diabetes mellitus with other skin ulcer 10/01/2016 No Yes E66.09 Other obesity due to excess calories 10/01/2016 No Yes Inactive Problems Resolved Problems Electronic Signature(s) Signed: 05/01/2020 9:36:19 AM By: Linton Ham MD Entered By: Linton Ham on 05/01/2020  08:03:29 -------------------------------------------------------------------------------- Progress Note Details Patient Name: Date of Service: MCA Michail Jewels Ware. 04/30/2020 2:45 PM Medical Record Number: [id number] Patient Account Number: [id number] Date of Birth/Sex: Treating RN: [birth year] (54 y.o. [name] Primary Care Provider: Fanny Bien Other Clinician: Referring Provider: Treating Provider/Extender: Scheryl Marten in Treatment: 186 Subjective History of Present Illness (HPI) The following HPI elements were documented for the patient's wound: Location: Left popliteal fossa Duration: She was aware of the wound October 2017 Context: The development/occurrence is unclear Modifying Factors: Chronic pressure secondary to thigh pannus 10/01/16- She is here for initial evaluation of her left popliteal fossa ulcer. She states that she noticed the area in October 2017. She is unaware of actual causative factor, states she has a history of eczema and uses a brush for bathing. She was being treated by her PCP, using Aquaphor since then, and stopped using last week. She denies ever being on an antibiotic for this ulcer. She states that this  drains excessively and soaks through her clothes, she is unable to visualize this area and has a friend that helps with the dressing changes. She was recently diagnosed with diabetes; A1c in December was 9; down to 7.5-8 in February. She is being managed on oral agents. She is a non-smoker. She has never been evaluated for venous reflux or lymphedema. 10/08/16- She is here in follow up regarding her left popliteal fossa pressure ulcer. She states that she did not receive any supplies as ordered. She states that the dressing material that was ordered and placed last week was in place for several days. She states that the odor and drainage is unchanged. She states that her PCP has written for 1 month off work,  starting Monday 4/23. The culture obtained last week was mixed skin flora, no identifiable pathogen. 10/15/16; difficult wound in the left popliteal fossa in the setting of morbid obesity and lymphedema. We have been using silver alginate although it is not really clear to me that this stays on for very long. She changes it once. Covering with AVD pads to try and separate wound from the surrounding pannus. A culture of this area showed both enterococcus and Enterobacter and I have her on a combination of amoxicillin and cipro. Although most Gram positives will be covered by Cipro, a big exception is Enterobacter particularly Enterobacter faecalis which generally requires ampicillin if indeed it is sensitive. A final issue is that the patient is limited by the co-pay on her insurance with regards to actual wound care supplies. I didn'Ware get into this in much detail although he crossed my mind that we may need to bring her back more than once a week for dressing changes. 10/22/16; difficult wound in the left popliteal fossa. I think this is a lymphedema type issue in the middle lobe folds in the back of her knee. We have been using silver alginate, ABD pads. Dimensions today are improved, base of the wound is improved. I had some thoughts about biopsying this however overall this is improved today and all put that off for now 10/29/16; difficult wound in the left popliteal fossa which I think is a pressure wound from associated large folds of lymphedema. We have been using silver alginate. 11/05/16; difficult wound in the left popliteal fossa. Using silver alginate change 3x/wk. Wound measuring slightly smaller 11/12/16; difficult wound in the setting of chronic lymphedema. Left popliteal fossa. Using so over alginate change 3 times a week, once here and wants by home health and/or the patient's friend who is a Marine scientist. The dimensions of this appear to be improving, the surface is certainly improved 12/03/41;  Patient now has lymphedema pumps. wound is slightly reduced in width. Still using silver alginate 11/26/16; patient has lymphedema pumps although she is having trouble with them in terms of fit. Nevertheless she tells me she is using them. She is still using silver alginate as the primary dressing 12/03/16; patient has lymphedema pumps and there is been significant improvement in the swelling in the leg. We've been using silver alginate but really only making minimal improvements if any in the overall wound area although the base of the wound is a lot better than when she first came here. 12/10/16; patient has increasing pain, drainage and odor this week. States this began to get worse over the weekend. She is been using her pumps at home and the edema is certainly better in the leg. We changed to [facility] Blue last week. She has home  health coming out once a week to change the dressing 12/17/16; culture I did last week showed a few MSSA. We called her in cephalexin at 500 4 times a day. She admits to less than 100% compliance with the 4 times a day dosing. I have encouraged her today. The surrounding tenderness and erythema in this wound is a lot better. Nursing reports that the diameter of the wound is down a centimeter. Healthy-looking wound base no surrounding tenderness. We continue to use silver alginate 12/24/16; she is still completing her antibiotics apparently not taking them exactly as prescribed. The wound is perhaps marginally smaller. Surface of this continues to look not 100% viable. We have been using silver collagen. Consider changing to Iodoflex 12/31/16; she is finished her antibiotics. There is no evidence to suspect ongoing infection in the wound. We have looked back over the pictures of the wound in the left popliteal fossa. She came in with a thick adherent necrotic eschar and now we are dealing with slight surface slough. No debridement will be planned today. I'm going to try [facility] in lieu of the silver alginate starting today 01/07/17; patient continues to do well using Hydrofera Blue. Reliably using her external compression pumps twice a day 01/15/17; the patient has been using [facility] with a general improvement in her wound condition and size. However she went back to work yesterday it would seem that her use of the compression pumps this week is been intermittent and at work she is unable to change the dressings. Per our intake nurse arrived with a macerated dressing in place. 01/22/17 on evaluation today patient appears to be doing a little bit worse. She is having more discomfort and there's a erythema surrounding the wound bed behind her left knee. She states this discomfort often is associated with infection and in fact this wound looks to likely be infected. She has no fevers, chills, nausea, vomiting, or diarrhea. 02/12/17; the patient's wound is not as good as I thought I would find it. She was making a good improvement when I saw her a month ago. She went back to work and largely she blames difficulties at work for this. Apparently during my absence she was treated with oral antibiotics starting on 8/3. I don'Ware believe there are any cultures 02/19/17; patient arrives in clinic today with the wound is essentially unchanged in terms of dimensions and wound appearance. We have been using silver alginate. She claims compliance with the compression pumps twice a day 02/25/17; no major change in the condition of the wound. We used Iodo flex last week with the purpose of trying to get a better-looking surface. She could not tolerate this because of discomfort. She found this especially bad when she used her compression pumps. Before this we use silver alginate again with not a lot of success. I changed her to [employer] which will certainly not have much debridement activity however I will probably change to weekly mechanical debridements. This will help with the  drainage. She is now at home/on short-term disability. 03/11/17; the patient is now on disability. She is been using PolyMem AG wound for the first time looks a little better this week and dimensions have improved. She is compliant with her external compression pumps 03/18/16; the patient is now on disability at home using her palms. We've been using PolyMen AG. Wound certainly looks smaller. Arrives today with an adherent surface requiring debridement. 04/08/17; patient has not been here in 3 weeks. Using  Anasept with PolyMem AG. She arrives today with an adherent surface over the wound. She did not wish debridement, there was edema fluid dripping through the wound surface even though she states she is using her external compression pumps 04/15/17; patient is using Anasept with PolyMem and AG. She arrives today telling us that her leg is been hurting since a home care nurse staff her dressing into her on Sunday therefore she could not use her compression pumps in spite of this her dimensions appear to be better although this is a very difficult wound to measure in the popliteal fossa of the left knee 04/22/17; arrives today in clinic using Anasept and PolyMem AG. She is using her compression pumps per her description. The wound surface however once again is not going to allow healing. She still has weeping edema fluid coming through the wound. No major change in dimensions 04/29/17; arrives today with the major deterioration in the wound both in terms of dimensions and condition of the wound surface. She reports increasing pain since earlier in the week at our intake nurse reports purulent drainage on the ABD pads.this is not going in the right direction. We put Hydrofera Blue on this last week but I'm going to change back to [employer] because of the drainage. I reviewed previous cultures last done in June showed methicillin sensitive staph aureus. We had been making progress with this wound presenting  initially with a necrotic wound on the popliteal fossa on the right. We managed to get a viable surface and some reduction in wound dimensions however in the past 4 weeks this is been getting gradually worse and today much worse in terms of both size and condition of the wound surface. The patient is having pain and will require empiric antibiotics because of the purulent drainage and deterioration in the wound 05/20/17; I haven'Ware seen this patient in 3 weeks. Biopsy of this wound I did last time suggested extensive granulation tissue with collections of neutrophils lymphocytes plasma cells histiocytes and multinucleated giant cells. There was no evidence of malignancy. Deep tissue culture I did showed MSSA and I gave her Keflex when she was here last time and renewed this for a week however she is still taking the medication I'm not certain she actually took it properly.the pathologist suggested the possibility of pyoderma gangrenosum, a vasculopathy. PAS stains and acid-fast stains were negative She also tells me that she is having mobility problems secondary to pain in both knees. She uses a walker but can barely make it to the bathroom. It sounds as though she has a history of chronic osteoarthritis and she tells me that she had a left anterior cruciate ligament tear at some time in the past 06/10/17; this is a patient that I haven'Ware seen in 3 weeks. She is completed her antibiotics. Biopsy suggested the possibility of infection versus pyoderma gangrenosum. I gave her 2 weeks of Keflex directed at [facility] she is repeated this.she called last week to request more antibiotics but I wanted to see her. She arrives today with her aunt. Apparently the patient has deteriorated in terms of function. According the patient and her aunt she can no longer ambulate or easily transfer. She is incontinent because she has urge incontinence etc. They requested admission to a rehabilitation facility or the hospital and  then a rehabilitation facility. I was not aware that the patient was declining in terms a shin but according to our staff that's been the case they haven'Ware seen  her walk in the him any months that she's been here. I wasn'Ware really aware of this. In fact the patient was working up until the end of August. In any case she arrives with the wound a lot worse 07/01/17; this is a patient I haven'Ware seen in 3 weeks. The last time she was here at the request of her family member we sent her to the ER. She apparently was kept overnight. She was treated for UTI. Attempts were made to find her long-term care although there was insurance and cost issues and she is back home. She now has her aunt living with her. I am not clear what they are using to the wound on the posterior left knee/popliteal fossa. Last biopsy I did of this area did not document pyoderma which had been raised by another/previous biopsy. We are not clear today what they're actually putting on the wound 07/15/17; she arrives today with improvement in her wound dimensions although there is still weeping edema fluid. She claims she is changing the dressing [silver alginate] twice daily. I've suggested increasing this to 3 times a day. She is changing the silver alginate twice a day 07/29/17; patient complains of pain drainage and odor coming from the wound which was different from 2 weeks ago.. We have been using silver alginate she has been compliant with her compression pumps 09/02/17; the patient continues to complain of pain and drainage. I gave her empiric antibiotics when she was last here 5 weeks ago but she says she only completed them recently. She seems confused about the time frame of her last visit here.she states she was concerned about an odor and stopped using the [facility] Blue on the wound in her left popliteal fossa about a week ago. I don'Ware think she is dressing this with anything since. She is using her lymphedema pumps once a  day.previous biopsy of done of this area showed inflammation associated with stasis dermatitis or trauma She is still not walking apparently because of severe osteoarthritis in both knees. She thinks she'll be going back to work in June and she needs to be ambulatory although I really can'Ware see this happening. 09/16/17; the patient is been using a mixture of Hydrofera Blue and PolyMen that she had left over. I'm not really sure how reliable this is. She tells me she is using her external compression pumps once a day. She had a steroid shot in her left knee this morning 09/30/17; 2 week follow-up for this patient with a difficult wound behind her left popliteal fossa. She has home health. They're using Hydrofera Blue. Measurement slightly smaller. She states she is using her compression pumps once a day 10/14/17; 2 week follow-up for this woman with a difficult wound with find her left popliteal fossa. She has home health. Using Hydrofera Blue. Measurements again today slightly smaller. Most of the surface of this covered in a nonviable tightly green adherent material. Some of this may be retained Hydrofera Blue nevertheless was debrided with great difficulty. She asked me about her upcoming disability which I think happens in June. I told her that from my point of view the wound itself does not qualify her to be disabled by itself. Now I'm aware that she can no longer stand and walk. This really deteriorated quite a bit during the course of the time she is been here. I suspect this is due to severe osteoarthritis of both knees and she is seen orthopedics for this. I think this is  a source of her disability but I don'Ware really feel comfortable with this given the fact that I don'Ware precisely know the exact diagnosis here. 10/28/17; 2 week follow-up for this woman with a difficult wound in her left popliteal fossa. This is complicated by lymphedema. She is episodic in using her compression pumps we obtained.  We've been using Hydrofera Blue. Dimensions of the wound are not any different however surface of this looks a lot better than last time. Prior to last visit at which time I had the debridement the wound, dimensions were slightly smaller. The patient came in last time talking to me about disability. I sent her to see her primary physician who I thought would do a proper evaluation for this patient with regards to her disability thoughts. The patient is able to stand and transfer but is no longer ambulatory. 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 . 11/08/17; this is a 2 week follow-up for patient with a difficult wound in the left popliteal fossa complicated by lymphedema. I am not sure about her compliance with the compression pumps we've been using Hydrofera Blue for 2 months now and we have been making decent progress however once again today she arrives with 50% of the overall wound area covered in tightly adherent black/green necrotic tissue. 12/02/17; difficult wound in the left popliteal fossa complicated by severe lymphedema. We've been using silver alginate although the patient ran out of this and she's been using Hydrofera Blue. Last time she is here she had a considerable amount of tightly adherent green necrotic tissue. Post debridement culture showed MSSA, I gave her 7 days worth of Septra DS which seems to have helped 12/24/17; almost palliative follow-up for this difficult area on the left popliteal fossa complicated by severe lymphedema. We've been using silver alginate although the patient tells me she's been using Hydrofera Blue. She does not separate the folds of this wound. She has been using her lymphedema pumps but not every day. 01/20/18 This is a difficult wound in the left popliteal fossa. Intake nurse noted some purulent drainage and necrotic material. She has severe lymphedema including the posterior thigh and the upper calf  surrounding this wound and there is continued leaking edema fluid out of the wound surface. This is not going to heal like this. She states she is being compliant with the lymphedema pumps once sometimes twice a day. However this is not controlling the edema around the wound either in the calf or the posterior thigh 02/10/18; really not making any progress here. The wound is actually larger. There is no purulent drainage. Culture I did last time was negative/multiple organisms 03/10/18; 1 month follow-up. The wound may be measures slightly smaller. We've been using care max directly on the wound surface. The area definitely looks less moist. She tells me she is also you been using her pumps religiously twice a day. In fact the area actually looks dry today. 04/12/2018; 1 month follow-up. She states after I used silver nitrate on this wound area a month ago that the pain was intolerable and continued all month. This made it too painful for her to use the care of max reliably or to use her compression pumps. She arrives in clinic today with her wound probably twice the size. There is no purulent drainage 05/03/2018; the patient has been using her pumps reliably twice a day over the last week but she finds the combination  of the pump and a dressing on the wound to be unduly painful therefore there is not been a dressing in the last week. She arrives in clinic today with the wound continuing to enlarge. She did not tolerate silver nitrate nor Kerramax directly on the wound 05/26/2018; the patient has not been using her pumps. Her wound in the left posterior popliteal fossa continues to expand. We are using silver alginate although the options for primary dressings are limited since she is paying out-of-pocket for this. Culture I did last time which was a PCR culture showed a scattered number of anaerobes low quantities. I did not think this was significant. 06/09/2018 the patient has not been using her  pumps. She puts care of max in the area on the posterior popliteal fossa. She has no one to help her clean this area. Home health is apparently coming by once a week. For some reason she has to purchase a lot of her supplies herself online. She does not have a lot of financial resources. She states the pumps hurt her and caused the wound to bleed. She is continuing to deteriorate Biopsy I did last visit did not show malignancy/fungus or atypical Mycobacterium. This is a second time I have biopsied this 06/30/2018; the patient states she is using her pumps at least once a day. She put silver alginate and a rolled towel in the area. She occasionally uses kerramax and/or ABD pads. She is decided that a application of silver nitrate I did because of very friable mucosa on this sometime in September is the reason that she could not use the pumps. I did not actually research this. This is clearly not the case 1/30; the patient states she is using her pumps once a day but sometimes for as long as 2 hours. I told her that she still needs to do this and additional time a day. She is using silver alginate. She states the [facility] hurts when she uses her pumps therefore she is not using this. She has ABD pads that she is getting from home health. Dimensions perhaps slightly improved 3/5; the patient uses her pumps once a day but she states for is sometimes as long as 2 hours. She has not managed to do this twice a day. She is using silver alginate. Separating these areas with ABDs and rolled towels. There is no change in dimensions of this wound if anything slightly larger although the surfaces look better 4/2; I do not see too much difference 1 way or the other in the area on the left popliteal fossa. She is not using her pumps. She did describe an episode of pain and odor a week or 2 ago however she said it is mostly subsided. Still using silver alginate. 5/8; monthly follow-up for this large area in the left  popliteal fossa. She is not using her compression pumps there is surrounding lymphedema from tissue in the posterior thigh in the posterior calf. Her home health nurse had called 2 weeks ago to report black drainage on the surface, she used silver alginate after that and it seems to have cleared up. 6/4; Patient is not using pumps. Finds silver alginate painful. I think she is just using ABD's in the area. She has a new wound laterally to the original wound. Leaking copious lymphedema 7/9; monthly follow-up. Patient is using her pumps about once a week finds it painful. She uses some combination of silver calcium alginate. She has to buy her own supplies  [place] and she uses an ABD. She has home health but her co-pay is such that it is cheaper for her to buy her supplies online 8/6-Patient is here for her monthly follow-up, she has started using her pumps again, apparently a couple of weeks ago she had a lot of pain and could not use the pumps, she is now using silver alginate, she states her left posterior calf wound had some drainage that was greenish now converted to serous 10/13; 78-monthfollow-up. The patient's wound is on the left popliteal fossa. Fairly substantial area in the middle of 2 pannus folds the lower part of her thigh and the upper part of her calf. She has been using silver alginate. As usual she presents with greenish drainage but the wound does not look infected 12/8; 264-monthollow-up. The patient now has 2 wounds in the popliteal fossa on the left. Fairly substantial area in the middle of 2 pannus folds in the lower part of her thigh and the upper part of her calf. She has been using silver alginate. There is a smaller wound laterally and posteriorly very difficult to see. The patient says she is using her compression pumps 3 times a week. She is not willing to separate the folds in this wound area. I am not sure I really understand the issue. 2/23; 2-61-monthllow-up. I did  not see a second wound this time. But otherwise I think the area on her left popliteal fossa is unchanged. The wound is between 2 fairly substantial pannus fold in the lower part of her thigh and the upper part of her calf. She has been using silver alginate which she buys online herself. She asks me about different dressings suggested by home health. She is not using her compression pumps and she does not keep these pannus fold separated. 4/20; I follow this woman palliatively on a 2-m59-monthis. She has a chronic wound on the left popliteal fossa. This has been there for 4 years between 2 fairly substantial pannus folds on the lower part of her thigh and the upper part of her calf this drains weeping edema fluid. I biopsied this area on 2 different occasions there was no malignancy. She has external compression pumps but probably uses them maximum of 4 times a week. She is technically homebound but able to transfer herself from bed to wheelchair etc. We have been using silver alginate for a long period of time Our intake nurses reported a greenish drainage 6/15; palliative wound on the left popliteal fossa. This is been there for several years as noted. She has 2 fairly substantial pannus areas that encompasses 1 from the posterior thigh and the other from the upper calf. She has compression pumps that we ordered for her early on she does not use them there is always a reason today it has she had to replace her lift chair. We have been using silver alginate She has [facility] home health and there are reports that she is having bleeding from certain areas of this wound. Not exactly certain where but it apparently pools around her foot this is happened 4 times since she was here last 2 months ago 8/24; 2-mo50-monthow-up for chronic wound on the left popliteal fossa. This is in the setting of severe lymphedema. The patient tells me she has recently gotten a lift chair and is going to try to use her  compression pumps again. We have been using silver alginate with ABDs to the wounds for  a prolonged period of time. The patient is basically confined to home now. She moves around mostly by pushing herself in a seated walker. She is able to stand and transfer perhaps take a few steps. Miraculously she is managing to hang on at home 11/9; this is a patient with a chronic wound and a pannus fold in her left popliteal fossa. She has massive lymphedema. We managed to get her compression pumps but she does not really use them largely related to musculoskeletal pain. She says she is getting a lift chair fixed and she may be able to use the pumps after this. She treats the wound with silver alginate and ABD pads. She purchased this herself on line "[employer]" she has home health. When I first met this patient she was ambulatory and working she is no longer working and really minimally ambulatory. Objective Constitutional Patient is hypertensive.. Pulse regular and within target range for patient.Marland Kitchen Respirations regular, non-labored and within target range.. Temperature is normal and within the target range for the patient.Marland Kitchen Appears in no distress. Vitals Time Taken: 4:13 PM, Height: 70 in, Weight: 484 lbs, BMI: 69.4, Temperature: 98.3 F, Pulse: 109 bpm, Respiratory Rate: 20 breaths/min, Blood Pressure: 180/72 mmHg. Respiratory work of breathing is normal. Cardiovascular Massive lymphedema in the left thigh. The wound in the popliteal fossa is between massive edema above it and some degree of nonpitting at that lymphedema below it. This is simply not going to heal like this.. General Notes: Wound exam; the wound itself does not look too much different to me. The tissue on the surface does not look too bad. There was some reports of slight green drainage from apparently the home health nurse to the patient but I could not see any of this. I wash this off with Anasept and gauze. She is apparently not able to  transfer into our examining tables. It was very difficult if not impossible for me to see this wound in its totality. I really did not see any evidence of infection Integumentary (Hair, Skin) Wound #1 status is Open. Original cause of wound was Gradually Appeared. The wound is located on the Left,Posterior Knee. The wound measures 4.5cm length x 9.5cm width x 0.2cm depth; 33.576cm^2 area and 6.715cm^3 volume. There is Fat Layer (Subcutaneous Tissue) exposed. There is no tunneling or undermining noted. There is a large amount of serosanguineous drainage noted. The wound margin is flat and intact. There is medium (34-66%) **red granulation within the wound bed. There is a medium (34-66%) amount of necrotic tissue within the wound bed including Adherent Slough. Assessment Active Problems ICD-10 Chronic venous hypertension (idiopathic) with inflammation of left lower extremity Lymphedema, not elsewhere classified Non-pressure chronic ulcer of left calf limited to breakdown of skin Type 2 diabetes mellitus with other skin ulcer Other obesity due to excess calories Plan Follow-up Appointments: Other: - 2 months Dressing Change Frequency: Wound #1 Left,Posterior Knee: Change dressing three times week. Wound Cleansing: May shower and wash wound with soap and water. - also cleanse with Vashe solution Primary Wound Dressing: Wound #1 Left,Posterior Knee: Calcium Alginate with Silver Secondary Dressing: Wound #1 Left,Posterior Knee: Kerlix/Rolled Gauze ABD pad - or incontinence pads secure with transpore tape Edema Control: Avoid standing for long periods of time Elevate legs to the level of the heart or above for 30 minutes daily and/or when sitting, a frequency of: - throughout the day Exercise regularly Segmental Compressive Device. - lymphedema pumps 60 minutes 2 times per day! Off-Loading: Other: -  place rolled towel or washcloth in skin fold behind left knee to separate skin in  fold Additional Orders / Instructions: Follow Nutritious Diet - increase protein and vegetables to aid in wound healing. Home Health: [facility] for [facility]. - [place]. 3 times per week; patient is homebound. 1. At this point I do not think this is a wound we are going to heal in the current settings. We did have this down to the size of a silver dollar at 1 point but she was much more compliant with edema control, tissue separation then she is now. 2. She is purchasing her own supplies therefore I think silver alginate makes a good palliative dressing 3. I biopsied this wound several times in the past all of it was nonspecific. It is not really changed recently. I do not think there is an alternative diagnosis here. She has weeping edema fluid coming out of this I think this is all related to poorly controlled lymphedema 4. Given the above I follow her on a palliative basis every 2 months. We are available to her if her situation changes Electronic Signature(s) Signed: 05/01/2020 9:36:19 AM By: Linton Ham MD Entered By: Linton Ham on 05/01/2020 08:17:06 -------------------------------------------------------------------------------- SuperBill Details Patient Name: Date of Service: [employer]. 04/30/2020 Medical Record Number: [id number] Patient Account Number: [id number] Date of Birth/Sex: Treating RN: 26-Dec-1965 (54 y.o. [name] Primary Care Provider: Fanny Bien Other Clinician: Referring Provider: Treating Provider/Extender: Scheryl Marten in Treatment: 186 Diagnosis Coding ICD-10 Codes Code Description [id number] Chronic venous hypertension (idiopathic) with inflammation of left lower extremity I89.0 Lymphedema, not elsewhere classified L97.221 Non-pressure chronic ulcer of left calf limited to breakdown of skin E11.622 Type 2 diabetes mellitus with other skin ulcer E66.09 Other obesity  due to excess calories Facility Procedures Physician Procedures : CPT4 Code Description Modifier [id number] - WC PHYS LEVEL 3 - EST PT ICD-10 Diagnosis Description I89.0 Lymphedema, not elsewhere classified L97.221 Non-pressure chronic ulcer of left calf limited to breakdown of skin I87.322 Chronic venous  hypertension (idiopathic) with inflammation of left lower extremity E11.622 Type 2 diabetes mellitus with other skin ulcer Quantity: 1 Electronic Signature(s) Signed: 05/01/2020 9:36:19 AM By: Linton Ham MD Entered By: Linton Ham on 05/01/2020 08:17:35

## 2020-05-02 NOTE — Progress Notes (Signed)
[name], [name] ([id number]) . Visit Report for 04/30/2020 Arrival Information Details Patient Name: Date of Service: MCA [name], [name] 04/30/2020 2:45 PM Medical Record Number: [id number] Patient Account Number: [id number] Date of Birth/Sex: Treating RN: 1966/02/10 (54 y.o. [name] Primary Care Biagio Snelson: Fanny Bien Other Clinician: Referring Kawon Willcutt: Treating Bonita Brindisi/Extender: Scheryl Marten in Treatment: 78 Visit Information History Since Last Visit Added or deleted any medications: No Patient Arrived: [name] Any new allergies or adverse reactions: No Arrival Time: 16:12 Had a fall or experienced change in No Accompanied By: self activities of daily living that may affect Transfer Assistance: None risk of falls: Patient Identification Verified: Yes Signs or symptoms of abuse/neglect since last visito No Secondary Verification Process Completed: Yes Hospitalized since last visit: No Patient Requires Transmission-Based Precautions: No Implantable device outside of the clinic excluding No Patient Has Alerts: No cellular tissue based products placed in the center since last visit: Has Dressing in Place as Prescribed: Yes Pain Present Now: Yes Electronic Signature(s) Signed: 04/30/2020 4:13:24 PM By: Sandre Kitty Entered By: Sandre Kitty on 04/30/2020 16:12:21 -------------------------------------------------------------------------------- Clinic Level of Care Assessment Details Patient Name: Date of Service: MCA [name], [name] 04/30/2020 2:45 PM Medical Record Number: [id number] Patient Account Number: [id number] Date of Birth/Sex: Treating RN: 24-May-1966 (53 y.o. [name] Primary Care Genevia Bouldin: Fanny Bien Other Clinician: Referring Taleigha Pinson: Treating Raychel Dowler/Extender: Scheryl Marten in Treatment: 186 Clinic Level of Care Assessment Items TOOL 4 Quantity Score X- 1  0 Use when only an EandM is performed on FOLLOW-UP visit ASSESSMENTS - Nursing Assessment / Reassessment X- 1 10 Reassessment of Co-morbidities (includes updates in patient status) X- 1 5 Reassessment of Adherence to Treatment Plan ASSESSMENTS - Wound and Skin A ssessment / Reassessment X - Simple Wound Assessment / Reassessment - one wound 1 5 []  - 0 Complex Wound Assessment / Reassessment - multiple wounds []  - 0 Dermatologic / Skin Assessment (not related to wound area) ASSESSMENTS - Focused Assessment []  - 0 Circumferential Edema Measurements - multi extremities []  - 0 Nutritional Assessment / Counseling / Intervention []  - 0 Lower Extremity Assessment (monofilament, tuning fork, pulses) []  - 0 Peripheral Arterial Disease Assessment (using hand held doppler) ASSESSMENTS - Ostomy and/or Continence Assessment and Care []  - 0 Incontinence Assessment and Management []  - 0 Ostomy Care Assessment and Management (repouching, etc.) PROCESS - Coordination of Care X - Simple Patient / Family Education for ongoing care 1 15 []  - 0 Complex (extensive) Patient / Family Education for ongoing care X- 1 10 Staff obtains Programmer, systems, Records, Ware Results / Process Orders est []  - 0 Staff telephones HHA, Nursing Homes / Clarify orders / etc []  - 0 Routine Transfer to another Facility (non-emergent condition) []  - 0 Routine Hospital Admission (non-emergent condition) []  - 0 New Admissions / Biomedical engineer / Ordering NPWT Apligraf, etc. , []  - 0 Emergency Hospital Admission (emergent condition) X- 1 10 Simple Discharge Coordination []  - 0 Complex (extensive) Discharge Coordination PROCESS - Special Needs []  - 0 Pediatric / Minor Patient Management []  - 0 Isolation Patient Management []  - 0 Hearing / Language / Visual special needs []  - 0 Assessment of Community assistance (transportation, D/C planning, etc.) []  - 0 Additional assistance / Altered mentation []  -  0 Support Surface(s) Assessment (bed, cushion, seat, etc.) INTERVENTIONS - Wound Cleansing / Measurement X - Simple Wound Cleansing - one wound 1 5 []  - 0 Complex Wound Cleansing -  multiple wounds X- 1 5 Wound Imaging (photographs - any number of wounds) []  - 0 Wound Tracing (instead of photographs) X- 1 5 Simple Wound Measurement - one wound []  - 0 Complex Wound Measurement - multiple wounds INTERVENTIONS - Wound Dressings []  - 0 Small Wound Dressing one or multiple wounds X- 1 15 Medium Wound Dressing one or multiple wounds []  - 0 Large Wound Dressing one or multiple wounds X- 1 5 Application of Medications - topical []  - 0 Application of Medications - injection INTERVENTIONS - Miscellaneous []  - 0 External ear exam []  - 0 Specimen Collection (cultures, biopsies, blood, body fluids, etc.) []  - 0 Specimen(s) / Culture(s) sent or taken to Lab for analysis []  - 0 Patient Transfer (multiple staff / Civil Service fast streamer / Similar devices) []  - 0 Simple Staple / Suture removal (25 or less) []  - 0 Complex Staple / Suture removal (26 or more) []  - 0 Hypo / Hyperglycemic Management (close monitor of Blood Glucose) []  - 0 Ankle / Brachial Index (ABI) - do not check if billed separately X- 1 5 Vital Signs Has the patient been seen at the hospital within the last three years: Yes Total Score: 95 Level Of Care: New/Established - Level 3 Electronic Signature(s) Signed: 05/01/2020 5:51:14 PM By: Carlene Coria RN Entered By: Carlene Coria on 04/30/2020 16:49:17 -------------------------------------------------------------------------------- Encounter Discharge Information Details Patient Name: Date of Service: MCA Sierra Jewels Ware. 04/30/2020 2:45 PM Medical Record Number: [id number] Patient Account Number: [id number] Date of Birth/Sex: Treating RN: 16-Jul-1965 (54 y.o. Nancy Fetter Primary Care Kelson Queenan: Fanny Bien Other Clinician: Referring Shanice Poznanski: Treating  Evania Lyne/Extender: Scheryl Marten in Treatment: 186 Encounter Discharge Information Items Discharge Condition: Stable Ambulatory Status: Walker Discharge Destination: Home Transportation: Private Auto Accompanied By: alone Schedule Follow-up Appointment: Yes Clinical Summary of Care: Patient Declined Electronic Signature(s) Signed: 05/02/2020 8:28:44 AM By: Levan Hurst RN, BSN Entered By: Levan Hurst on 04/30/2020 17:03:42 -------------------------------------------------------------------------------- Lower Extremity Assessment Details Patient Name: Date of Service: MCA Sierra Jewels Ware. 04/30/2020 2:45 PM Medical Record Number: [id number] Patient Account Number: [id number] Date of Birth/Sex: Treating RN: 12-27-1965 (54 y.o. Nancy Fetter Primary Care Paulita Licklider: Fanny Bien Other Clinician: Referring Aleyna Cueva: Treating Jing Howatt/Extender: Leana Gamer Weeks in Treatment: 186 Edema Assessment Assessed: Shirlyn Goltz: No] Patrice Paradise: No] Edema: [Left: Ye] [Right: s] Vascular Assessment Pulses: Dorsalis Pedis Palpable: [Left:Yes] Electronic Signature(s) Signed: 05/02/2020 8:28:44 AM By: Levan Hurst RN, BSN Entered By: Levan Hurst on 04/30/2020 16:30:51 -------------------------------------------------------------------------------- Multi Wound Chart Details Patient Name: Date of Service: MCA Sierra Jewels Ware. 04/30/2020 2:45 PM Medical Record Number: [id number] Patient Account Number: [id number] Date of Birth/Sex: Treating RN: 1966/04/23 (54 y.o. [name] Primary Care Briana Farner: Fanny Bien Other Clinician: Referring Tymeer Vaquera: Treating Alexys Lobello/Extender: Scheryl Marten in Treatment: 186 Vital Signs Height(in): 70 Pulse(bpm): 109 Weight(lbs): 484 Blood Pressure(mmHg): 180/72 Body Mass Index(BMI): 69 Temperature(F): 98.3 Respiratory Rate(breaths/min): 20 Photos: [1:No  Photos Left, Posterior Knee] [N/A:N/A N/A] Wound Location: [1:Gradually Appeared] [N/A:N/A] Wounding Event: [1:Diabetic Wound/Ulcer of the Lower] [N/A:N/A] Primary Etiology: [1:Extremity Type II Diabetes, Osteoarthritis] [N/A:N/A] Comorbid History: [1:03/23/2016] [N/A:N/A] Date Acquired: [1:186] [N/A:N/A] Weeks of Treatment: [1:Open] [N/A:N/A] Wound Status: [1:4.5x9.5x0.2] [N/A:N/A] Measurements L x W x D (cm) [1:33.576] [N/A:N/A] A (cm) : rea [1:6.715] [N/A:N/A] Volume (cm) : [1:-22.80%] [N/A:N/A] % Reduction in A rea: [1:75.40%] [N/A:N/A] % Reduction in Volume: [1:Grade 2] [N/A:N/A] Classification: [1:Large] [N/A:N/A] Exudate A mount: [1:Serosanguineous] [N/A:N/A] Exudate Type: [1:red, Sierra] [N/A:N/A]  Exudate Color: [1:Flat and Intact] [N/A:N/A] Wound Margin: [1:Medium (34-66%)] [N/A:N/A] Granulation A mount: [1:Red] [N/A:N/A] Granulation Quality: [1:Medium (34-66%)] [N/A:N/A] Necrotic A mount: [1:Fat Layer (Subcutaneous Tissue): Yes N/A] Exposed Structures: [1:Fascia: No Tendon: No Muscle: No Joint: No Bone: No Small (1-33%)] [N/A:N/A] Treatment Notes Wound #1 (Left, Posterior Knee) 1. Cleanse With Wound Cleanser 3. Primary Dressing Applied Calcium Alginate Ag 4. Secondary Dressing ABD Pad Other secondary dressing (specify in notes) 5. Secured With Tape Notes patient requested to use her own padding (incontinence pads x3), abd pad x2 and tape to secure dressing Electronic Signature(s) Signed: 05/01/2020 9:36:19 AM By: Linton Ham MD Signed: 05/01/2020 5:51:14 PM By: Carlene Coria RN Entered By: Linton Ham on 05/01/2020 08:03:41 -------------------------------------------------------------------------------- Multi-Disciplinary Care Plan Details Patient Name: Date of Service: MCA Sierra Jewels Ware. 04/30/2020 2:45 PM Medical Record Number: [id number] Patient Account Number: [id number] Date of Birth/Sex: Treating RN: 07-29-1965 (54 y.o. [name] Primary Care Gautham Hewins: Fanny Bien Other Clinician: Referring Jianna Drabik: Treating Keydi Giel/Extender: Scheryl Marten in Treatment: 186 Active Inactive Wound/Skin Impairment Nursing Diagnoses: Impaired tissue integrity Goals: Patient/caregiver will verbalize understanding of skin care regimen Date Initiated: 09/16/2017 Target Resolution Date: 05/15/2020 Goal Status: Active Ulcer/skin breakdown will heal within 14 weeks Date Initiated: 04/12/2018 Date Inactivated: 05/03/2018 Target Resolution Date: 07/13/2018 Unmet Reason: not healed, comorbid Goal Status: Unmet morbid obesity Interventions: Assess patient/caregiver ability to perform ulcer/skin care regimen upon admission and as needed Notes: Electronic Signature(s) Signed: 05/01/2020 5:51:14 PM By: Carlene Coria RN Entered By: Carlene Coria on 04/30/2020 15:32:08 -------------------------------------------------------------------------------- Pain Assessment Details Patient Name: Date of Service: [name], [name] 04/30/2020 2:45 PM Medical Record Number: [id number] Patient Account Number: [id number] Date of Birth/Sex: Treating RN: 1966-04-18 (55 y.o. [name] Primary Care Anniece Bleiler: Fanny Bien Other Clinician: Referring Harshil Cavallaro: Treating Nekayla Heider/Extender: Scheryl Marten in Treatment: 186 Active Problems Location of Pain Severity and Description of Pain Patient Has Paino Yes Site Locations Rate the pain. Rate the pain. Current Pain Level: 9 Pain Management and Medication Current Pain Management: Electronic Signature(s) Signed: 04/30/2020 4:20:25 PM By: Sandre Kitty Signed: 05/01/2020 5:51:14 PM By: Carlene Coria RN Previous Signature: 04/30/2020 4:13:24 PM Version By: Sandre Kitty Entered By: Sandre Kitty on 04/30/2020  16:16:27 -------------------------------------------------------------------------------- Patient/Caregiver Education Details Patient Name: Date of Service: MCA [name] 11/9/2021andnbsp2:45 PM Medical Record Number: [id number] Patient Account Number: [id number] Date of Birth/Gender: Treating RN: 1966/02/07 (54 y.o. [name] Primary Care Physician: Fanny Bien Other Clinician: Referring Physician: Treating Physician/Extender: Scheryl Marten in Treatment: 186 Education Assessment Education Provided To: Patient Education Topics Provided Wound/Skin Impairment: Methods: Explain/Verbal Responses: State content correctly Electronic Signature(s) Signed: 05/01/2020 5:51:14 PM By: Carlene Coria RN Entered By: Carlene Coria on 04/30/2020 15:32:24 -------------------------------------------------------------------------------- Wound Assessment Details Patient Name: Date of Service: MCA [name] 04/30/2020 2:45 PM Medical Record Number: [id number] Patient Account Number: [id number] Date of Birth/Sex: Treating RN: 1966/03/07 (53 y.o. [name] Primary Care Roselee Tayloe: Fanny Bien Other Clinician: Referring Renesha Lizama: Treating [name]/Extender: Scheryl Marten in Treatment: 186 Wound Status Wound Number: 1 Primary Etiology: Diabetic Wound/Ulcer of the Lower Extremity Wound Location: Left, Posterior Knee Wound Status: Open Wounding Event: Gradually Appeared Comorbid History: Type II Diabetes, Osteoarthritis Date Acquired: 03/23/2016 Weeks Of Treatment: 186 Clustered Wound: No Wound Measurements Length: (cm) 4.5 Width: (cm) 9.5 Depth: (cm) 0.2 Area: (cm) 33.576 Volume: (cm) 6.715 % Reduction in Area: -22.8% %  Reduction in Volume: 75.4% Epithelialization: Small (1-33%) Tunneling: No Undermining: No Wound Description Classification: Grade 2 Wound Margin: Flat and Intact Exudate  Amount: Large Exudate Type: Serosanguineous Exudate Color: red, Sierra Foul Odor After Cleansing: No Slough/Fibrino Yes Wound Bed Granulation Amount: Medium (34-66%) Exposed Structure Granulation Quality: Red Fascia Exposed: No Necrotic Amount: Medium (34-66%) Fat Layer (Subcutaneous Tissue) Exposed: Yes Necrotic Quality: Adherent Slough Tendon Exposed: No Muscle Exposed: No Joint Exposed: No Bone Exposed: No Treatment Notes Wound #1 (Left, Posterior Knee) 1. Cleanse With Wound Cleanser 3. Primary Dressing Applied Calcium Alginate Ag 4. Secondary Dressing ABD Pad Other secondary dressing (specify in notes) 5. Secured With Tape Notes patient requested to use her own padding (incontinence pads x3), abd pad x2 and tape to secure dressing Electronic Signature(s) Signed: 05/01/2020 5:51:14 PM By: Carlene Coria RN Signed: 05/02/2020 8:28:44 AM By: Levan Hurst RN, BSN Entered By: Levan Hurst on 04/30/2020 16:31:24 -------------------------------------------------------------------------------- Coco Details Patient Name: Date of Service: MCA Sierra Jewels Ware. 04/30/2020 2:45 PM Medical Record Number: [id number] Patient Account Number: [id number] Date of Birth/Sex: Treating RN: 03-03-1966 (54 y.o. [name] Primary Care Jaxsen Bernhart: Fanny Bien Other Clinician: Referring Ariabella Brien: Treating Annleigh Knueppel/Extender: Scheryl Marten in Treatment: 186 Vital Signs Time Taken: 16:13 Temperature (F): 98.3 Height (in): 70 Pulse (bpm): 109 Weight (lbs): 484 Respiratory Rate (breaths/min): 20 Body Mass Index (BMI): 69.4 Blood Pressure (mmHg): 180/72 Reference Range: 80 - 120 mg / dl Electronic Signature(s) Signed: 04/30/2020 4:20:25 PM By: Sandre Kitty Entered By: Sandre Kitty on 04/30/2020 16:16:13

## 2020-07-02 ENCOUNTER — Encounter (HOSPITAL_BASED_OUTPATIENT_CLINIC_OR_DEPARTMENT_OTHER): Payer: Medicare PPO | Admitting: Internal Medicine

## 2020-07-16 ENCOUNTER — Encounter (HOSPITAL_BASED_OUTPATIENT_CLINIC_OR_DEPARTMENT_OTHER): Payer: Medicare PPO | Attending: Internal Medicine | Admitting: Internal Medicine

## 2020-07-16 ENCOUNTER — Other Ambulatory Visit: Payer: Self-pay

## 2020-07-16 DIAGNOSIS — I87322 Chronic venous hypertension (idiopathic) with inflammation of left lower extremity: Secondary | ICD-10-CM | POA: Diagnosis not present

## 2020-07-16 DIAGNOSIS — L97221 Non-pressure chronic ulcer of left calf limited to breakdown of skin: Secondary | ICD-10-CM | POA: Insufficient documentation

## 2020-07-16 DIAGNOSIS — E11622 Type 2 diabetes mellitus with other skin ulcer: Secondary | ICD-10-CM | POA: Insufficient documentation

## 2020-07-16 DIAGNOSIS — E6609 Other obesity due to excess calories: Secondary | ICD-10-CM | POA: Diagnosis not present

## 2020-07-16 DIAGNOSIS — Z6841 Body Mass Index (BMI) 40.0 and over, adult: Secondary | ICD-10-CM | POA: Diagnosis not present

## 2020-07-16 DIAGNOSIS — I890 Lymphedema, not elsewhere classified: Secondary | ICD-10-CM | POA: Diagnosis not present

## 2020-07-17 NOTE — Progress Notes (Signed)
VIOLA, KINNICK T ([id number]) . Visit Report for 07/16/2020 HPI Details Patient Name: Date of Service: MCA OLEVIA, [place] 07/16/2020 3:00 PM Medical Record Number: [id number] Patient Account Number: [id number] Date of Birth/Sex: Treating RN: June 18, 1966 (55 y.o. Tonita Phoenix, Lauren Primary Care Provider: Fanny Bien Other Clinician: Referring Provider: Treating Provider/Extender: Scheryl Marten in Treatment: 197 History of Present Illness Location: Left popliteal fossa Duration: She was aware of the wound October 2017 Context: The development/occurrence is unclear Modifying Factors: Chronic pressure secondary to thigh pannus HPI Description: 10/01/16- She is here for initial evaluation of her left popliteal fossa ulcer. She states that she noticed the area in October 2017. She is unaware of actual causative factor, states she has a history of eczema and uses a brush for bathing. She was being treated by her PCP, using Aquaphor since then, and stopped using last week. She denies ever being on an antibiotic for this ulcer. She states that this drains excessively and soaks through her clothes, she is unable to visualize this area and has a friend that helps with the dressing changes. She was recently diagnosed with diabetes; A1c in December was 9; down to 7.5-8 in February. She is being managed on oral agents. She is a non-smoker. She has never been evaluated for venous reflux or lymphedema. 10/08/16- She is here in follow up regarding her left popliteal fossa pressure ulcer. She states that she did not receive any supplies as ordered. She states that the dressing material that was ordered and placed last week was in place for several days. She states that the odor and drainage is unchanged. She states that her PCP has written for 1 month off work, starting Monday 4/23. The culture obtained last week was mixed skin flora, no identifiable pathogen. 10/15/16;  difficult wound in the left popliteal fossa in the setting of morbid obesity and lymphedema. We have been using silver alginate although it is not really clear to me that this stays on for very long. She changes it once. Covering with AVD pads to try and separate wound from the surrounding pannus. A culture of this area showed both enterococcus and Enterobacter and I have her on a combination of amoxicillin and cipro. Although most Gram positives will be covered by Cipro, a big exception is Enterobacter particularly Enterobacter faecalis which generally requires ampicillin if indeed it is sensitive. A final issue is that the patient is limited by the co-pay on her insurance with regards to actual wound care supplies. I didn't get into this in much detail although he crossed my mind that we may need to bring her back more than once a week for dressing changes. 10/22/16; difficult wound in the left popliteal fossa. I think this is a lymphedema type issue in the middle lobe folds in the back of her knee. We have been using silver alginate, ABD pads. Dimensions today are improved, base of the wound is improved. I had some thoughts about biopsying this however overall this is improved today and all put that off for now 10/29/16; difficult wound in the left popliteal fossa which I think is a pressure wound from associated large folds of lymphedema. We have been using silver alginate. 11/05/16; difficult wound in the left popliteal fossa. Using silver alginate change 3x/wk. Wound measuring slightly smaller 11/12/16; difficult wound in the setting of chronic lymphedema. Left popliteal fossa. Using so over alginate change 3 times a week, once here and wants by home health and/or  the patient's friend who is a Marine scientist. The dimensions of this appear to be improving, the surface is certainly improved 1/41/03; Patient now has lymphedema pumps. wound is slightly reduced in width. Still using silver alginate 11/26/16; patient  has lymphedema pumps although she is having trouble with them in terms of fit. Nevertheless she tells me she is using them. She is still using silver alginate as the primary dressing 12/03/16; patient has lymphedema pumps and there is been significant improvement in the swelling in the leg. We've been using silver alginate but really only making minimal improvements if any in the overall wound area although the base of the wound is a lot better than when she first came here. 12/10/16; patient has increasing pain, drainage and odor this week. States this began to get worse over the weekend. She is been using her pumps at home and the edema is certainly better in the leg. We changed to [facility] Blue last week. She has home health coming out once a week to change the dressing 12/17/16; culture I did last week showed a few MSSA. We called her in cephalexin at 500 4 times a day. She admits to less than 100% compliance with the 4 times a day dosing. I have encouraged her today. The surrounding tenderness and erythema in this wound is a lot better. Nursing reports that the diameter of the wound is down a centimeter. Healthy-looking wound base no surrounding tenderness. We continue to use silver alginate 12/24/16; she is still completing her antibiotics apparently not taking them exactly as prescribed. The wound is perhaps marginally smaller. Surface of this continues to look not 100% viable. We have been using silver collagen. Consider changing to Iodoflex 12/31/16; she is finished her antibiotics. There is no evidence to suspect ongoing infection in the wound. We have looked back over the pictures of the wound in the left popliteal fossa. She came in with a thick adherent necrotic eschar and now we are dealing with slight surface slough. No debridement will be planned today. I'm going to try [facility] in lieu of the silver alginate starting today 01/07/17; patient continues to do well using Hydrofera Blue.  Reliably using her external compression pumps twice a day 01/15/17; the patient has been using [facility] with a general improvement in her wound condition and size. However she went back to work yesterday it would seem that her use of the compression pumps this week is been intermittent and at work she is unable to change the dressings. Per our intake nurse arrived with a macerated dressing in place. 01/22/17 on evaluation today patient appears to be doing a little bit worse. She is having more discomfort and there's a erythema surrounding the wound bed behind her left knee. She states this discomfort often is associated with infection and in fact this wound looks to likely be infected. She has no fevers, chills, nausea, vomiting, or diarrhea. 02/12/17; the patient's wound is not as good as I thought I would find it. She was making a good improvement when I saw her a month ago. She went back to work and largely she blames difficulties at work for this. Apparently during my absence she was treated with oral antibiotics starting on 8/3. I don't believe there are any cultures 02/19/17; patient arrives in clinic today with the wound is essentially unchanged in terms of dimensions and wound appearance. We have been using silver alginate. She claims compliance with the compression pumps twice a day 02/25/17; no  major change in the condition of the wound. We used Iodo flex last week with the purpose of trying to get a better-looking surface. She could not tolerate this because of discomfort. She found this especially bad when she used her compression pumps. Before this we use silver alginate again with not a lot of success. I changed her to [employer] which will certainly not have much debridement activity however I will probably change to weekly mechanical debridements. This will help with the drainage. She is now at home/on short-term disability. 03/11/17; the patient is now on disability. She is been using  PolyMem AG wound for the first time looks a little better this week and dimensions have improved. She is compliant with her external compression pumps 03/18/16; the patient is now on disability at home using her palms. We've been using PolyMen AG. Wound certainly looks smaller. Arrives today with an adherent surface requiring debridement. 04/08/17; patient has not been here in 3 weeks. Using Anasept with PolyMem AG. She arrives today with an adherent surface over the wound. She did not wish debridement, there was edema fluid dripping through the wound surface even though she states she is using her external compression pumps 04/15/17; patient is using Anasept with PolyMem and AG. She arrives today telling us that her leg is been hurting since a home care nurse staff her dressing into her on Sunday therefore she could not use her compression pumps in spite of this her dimensions appear to be better although this is a very difficult wound to measure in the popliteal fossa of the left knee 04/22/17; arrives today in clinic using Anasept and PolyMem AG. She is using her compression pumps per her description. The wound surface however once again is not going to allow healing. She still has weeping edema fluid coming through the wound. No major change in dimensions 04/29/17; arrives today with the major deterioration in the wound both in terms of dimensions and condition of the wound surface. She reports increasing pain since earlier in the week at our intake nurse reports purulent drainage on the ABD pads.this is not going in the right direction. We put Hydrofera Blue on this last week but I'm going to change back to [employer] because of the drainage. I reviewed previous cultures last done in June showed methicillin sensitive staph aureus. We had been making progress with this wound presenting initially with a necrotic wound on the popliteal fossa on the right. We managed to get a viable surface and some  reduction in wound dimensions however in the past 4 weeks this is been getting gradually worse and today much worse in terms of both size and condition of the wound surface. The patient is having pain and will require empiric antibiotics because of the purulent drainage and deterioration in the wound 05/20/17; I haven't seen this patient in 3 weeks. Biopsy of this wound I did last time suggested extensive granulation tissue with collections of neutrophils lymphocytes plasma cells histiocytes and multinucleated giant cells. There was no evidence of malignancy. Deep tissue culture I did showed MSSA and I gave her Keflex when she was here last time and renewed this for a week however she is still taking the medication I'm not certain she actually took it properly.the pathologist suggested the possibility of pyoderma gangrenosum, a vasculopathy. PAS stains and acid-fast stains were negative She also tells me that she is having mobility problems secondary to pain in both knees. She uses a walker but can  barely make it to the bathroom. It sounds as though she has a history of chronic osteoarthritis and she tells me that she had a left anterior cruciate ligament tear at some time in the past 06/10/17; this is a patient that I haven't seen in 3 weeks. She is completed her antibiotics. Biopsy suggested the possibility of infection versus pyoderma gangrenosum. I gave her 2 weeks of Keflex directed at [facility] she is repeated this.she called last week to request more antibiotics but I wanted to see her. She arrives today with her aunt. Apparently the patient has deteriorated in terms of function. According the patient and her aunt she can no longer ambulate or easily transfer. She is incontinent because she has urge incontinence etc. They requested admission to a rehabilitation facility or the hospital and then a rehabilitation facility. I was not aware that the patient was declining in terms a shin but according to  our staff that's been the case they haven't seen her walk in the him any months that she's been here. I wasn't really aware of this. In fact the patient was working up until the end of August. In any case she arrives with the wound a lot worse 07/01/17; this is a patient I haven't seen in 3 weeks. The last time she was here at the request of her family member we sent her to the ER. She apparently was kept overnight. She was treated for UTI. Attempts were made to find her long-term care although there was insurance and cost issues and she is back home. She now has her aunt living with her. I am not clear what they are using to the wound on the posterior left knee/popliteal fossa. Last biopsy I did of this area did not document pyoderma which had been raised by another/previous biopsy. We are not clear today what they're actually putting on the wound 07/15/17; she arrives today with improvement in her wound dimensions although there is still weeping edema fluid. She claims she is changing the dressing [silver alginate] twice daily. I've suggested increasing this to 3 times a day. She is changing the silver alginate twice a day 07/29/17; patient complains of pain drainage and odor coming from the wound which was different from 2 weeks ago.. We have been using silver alginate she has been compliant with her compression pumps 09/02/17; the patient continues to complain of pain and drainage. I gave her empiric antibiotics when she was last here 5 weeks ago but she says she only completed them recently. She seems confused about the time frame of her last visit here.she states she was concerned about an odor and stopped using the [facility] Blue on the wound in her left popliteal fossa about a week ago. I don't think she is dressing this with anything since. She is using her lymphedema pumps once a day.previous biopsy of done of this area showed inflammation associated with stasis dermatitis or trauma She is still  not walking apparently because of severe osteoarthritis in both knees. She thinks she'll be going back to work in June and she needs to be ambulatory although I really can't see this happening. 09/16/17; the patient is been using a mixture of Hydrofera Blue and PolyMen that she had left over. I'm not really sure how reliable this is. She tells me she is using her external compression pumps once a day. She had a steroid shot in her left knee this morning 09/30/17; 2 week follow-up for this patient with a difficult  wound behind her left popliteal fossa. She has home health. They're using Hydrofera Blue. Measurement slightly smaller. She states she is using her compression pumps once a day 10/14/17; 2 week follow-up for this woman with a difficult wound with find her left popliteal fossa. She has home health. Using Hydrofera Blue. Measurements again today slightly smaller. Most of the surface of this covered in a nonviable tightly green adherent material. Some of this may be retained Hydrofera Blue nevertheless was debrided with great difficulty. She asked me about her upcoming disability which I think happens in June. I told her that from my point of view the wound itself does not qualify her to be disabled by itself. Now I'm aware that she can no longer stand and walk. This really deteriorated quite a bit during the course of the time she is been here. I suspect this is due to severe osteoarthritis of both knees and she is seen orthopedics for this. I think this is a source of her disability but I don't really feel comfortable with this given the fact that I don't precisely know the exact diagnosis here. 10/28/17; 2 week follow-up for this woman with a difficult wound in her left popliteal fossa. This is complicated by lymphedema. She is episodic in using her compression pumps we obtained. We've been using Hydrofera Blue. Dimensions of the wound are not any different however surface of this looks a lot  better than last time. Prior to last visit at which time I had the debridement the wound, dimensions were slightly smaller. The patient came in last time talking to me about disability. I sent her to see her primary physician who I thought would do a proper evaluation for this patient with regards to her disability thoughts. The patient is able to stand and transfer but is no longer ambulatory. 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 . 11/08/17; this is a 2 week follow-up for patient with a difficult wound in the left popliteal fossa complicated by lymphedema. I am not sure about her compliance with the compression pumps we've been using Hydrofera Blue for 2 months now and we have been making decent progress however once again today she arrives with 50% of the overall wound area covered in tightly adherent black/green necrotic tissue. 12/02/17; difficult wound in the left popliteal fossa complicated by severe lymphedema. We've been using silver alginate although the patient ran out of this and she's been using Hydrofera Blue. Last time she is here she had a considerable amount of tightly adherent green necrotic tissue. Post debridement culture showed MSSA, I gave her 7 days worth of Septra DS which seems to have helped 12/24/17; almost palliative follow-up for this difficult area on the left popliteal fossa complicated by severe lymphedema. We've been using silver alginate although the patient tells me she's been using Hydrofera Blue. She does not separate the folds of this wound. She has been using her lymphedema pumps but not every day. 01/20/18 This is a difficult wound in the left popliteal fossa. Intake nurse noted some purulent drainage and necrotic material. She has severe lymphedema including the posterior thigh and the upper calf surrounding this wound and there is continued leaking edema fluid out of the wound surface. This is not going to heal  like this. She states she is being compliant with the lymphedema pumps once sometimes twice a day. However this is not controlling the edema around the wound  either in the calf or the posterior thigh 02/10/18; really not making any progress here. The wound is actually larger. There is no purulent drainage. Culture I did last time was negative/multiple organisms 03/10/18; 1 month follow-up. The wound may be measures slightly smaller. We've been using care max directly on the wound surface. The area definitely looks less moist. She tells me she is also you been using her pumps religiously twice a day. In fact the area actually looks dry today. 04/12/2018; 1 month follow-up. She states after I used silver nitrate on this wound area a month ago that the pain was intolerable and continued all month. This made it too painful for her to use the care of max reliably or to use her compression pumps. She arrives in clinic today with her wound probably twice the size. There is no purulent drainage 05/03/2018; the patient has been using her pumps reliably twice a day over the last week but she finds the combination of the pump and a dressing on the wound to be unduly painful therefore there is not been a dressing in the last week. She arrives in clinic today with the wound continuing to enlarge. She did not tolerate silver nitrate nor Kerramax directly on the wound 05/26/2018; the patient has not been using her pumps. Her wound in the left posterior popliteal fossa continues to expand. We are using silver alginate although the options for primary dressings are limited since she is paying out-of-pocket for this. Culture I did last time which was a PCR culture showed a scattered number of anaerobes low quantities. I did not think this was significant. 06/09/2018 the patient has not been using her pumps. She puts care of max in the area on the posterior popliteal fossa. She has no one to help her clean this area. Home  health is apparently coming by once a week. For some reason she has to purchase a lot of her supplies herself online. She does not have a lot of financial resources. She states the pumps hurt her and caused the wound to bleed. She is continuing to deteriorate Biopsy I did last visit did not show malignancy/fungus or atypical Mycobacterium. This is a second time I have biopsied this 06/30/2018; the patient states she is using her pumps at least once a day. She put silver alginate and a rolled towel in the area. She occasionally uses kerramax and/or ABD pads. She is decided that a application of silver nitrate I did because of very friable mucosa on this sometime in September is the reason that she could not use the pumps. I did not actually research this. This is clearly not the case 1/30; the patient states she is using her pumps once a day but sometimes for as long as 2 hours. I told her that she still needs to do this and additional time a day. She is using silver alginate. She states the [facility] hurts when she uses her pumps therefore she is not using this. She has ABD pads that she is getting from home health. Dimensions perhaps slightly improved 3/5; the patient uses her pumps once a day but she states for is sometimes as long as 2 hours. She has not managed to do this twice a day. She is using silver alginate. Separating these areas with ABDs and rolled towels. There is no change in dimensions of this wound if anything slightly larger although the surfaces look better 4/2; I do not see too much difference 1 way  or the other in the area on the left popliteal fossa. She is not using her pumps. She did describe an episode of pain and odor a week or 2 ago however she said it is mostly subsided. Still using silver alginate. 5/8; monthly follow-up for this large area in the left popliteal fossa. She is not using her compression pumps there is surrounding lymphedema from tissue in the posterior thigh in  the posterior calf. Her home health nurse had called 2 weeks ago to report black drainage on the surface, she used silver alginate after that and it seems to have cleared up. 6/4; Patient is not using pumps. Finds silver alginate painful. I think she is just using ABD's in the area. She has a new wound laterally to the original wound. Leaking copious lymphedema 7/9; monthly follow-up. Patient is using her pumps about once a week finds it painful. She uses some combination of silver calcium alginate. She has to buy her own supplies [place] and she uses an ABD. She has home health but her co-pay is such that it is cheaper for her to buy her supplies online 8/6-Patient is here for her monthly follow-up, she has started using her pumps again, apparently a couple of weeks ago she had a lot of pain and could not use the pumps, she is now using silver alginate, she states her left posterior calf wound had some drainage that was greenish now converted to serous 10/13; 67-monthfollow-up. The patient's wound is on the left popliteal fossa. Fairly substantial area in the middle of 2 pannus folds the lower part of her thigh and the upper part of her calf. She has been using silver alginate. As usual she presents with greenish drainage but the wound does not look infected 12/8; 279-monthollow-up. The patient now has 2 wounds in the popliteal fossa on the left. Fairly substantial area in the middle of 2 pannus folds in the lower part of her thigh and the upper part of her calf. She has been using silver alginate. There is a smaller wound laterally and posteriorly very difficult to see. The patient says she is using her compression pumps 3 times a week. She is not willing to separate the folds in this wound area. I am not sure I really understand the issue. 2/23; 2-40-monthllow-up. I did not see a second wound this time. But otherwise I think the area on her left popliteal fossa is unchanged. The wound is between  2 fairly substantial pannus fold in the lower part of her thigh and the upper part of her calf. She has been using silver alginate which she buys online herself. She asks me about different dressings suggested by home health. She is not using her compression pumps and she does not keep these pannus fold separated. 4/20; I follow this woman palliatively on a 2-m58-monthis. She has a chronic wound on the left popliteal fossa. This has been there for 4 years between 2 fairly substantial pannus folds on the lower part of her thigh and the upper part of her calf this drains weeping edema fluid. I biopsied this area on 2 different occasions there was no malignancy. She has external compression pumps but probably uses them maximum of 4 times a week. She is technically homebound but able to transfer herself from bed to wheelchair etc. We have been using silver alginate for a long period of time Our intake nurses reported a greenish drainage 6/15; palliative wound on the  left popliteal fossa. This is been there for several years as noted. She has 2 fairly substantial pannus areas that encompasses 1 from the posterior thigh and the other from the upper calf. She has compression pumps that we ordered for her early on she does not use them there is always a reason today it has she had to replace her lift chair. We have been using silver alginate She has [facility] home health and there are reports that she is having bleeding from certain areas of this wound. Not exactly certain where but it apparently pools around her foot this is happened 4 times since she was here last 2 months ago 8/24; 69-monthfollow-up for chronic wound on the left popliteal fossa. This is in the setting of severe lymphedema. The patient tells me she has recently gotten a lift chair and is going to try to use her compression pumps again. We have been using silver alginate with ABDs to the wounds for a prolonged period of time. The patient is  basically confined to home now. She moves around mostly by pushing herself in a seated walker. She is able to stand and transfer perhaps take a few steps. Miraculously she is managing to hang on at home 11/9; this is a patient with a chronic wound and a pannus fold in her left popliteal fossa. She has massive lymphedema. We managed to get her compression pumps but she does not really use them largely related to musculoskeletal pain. She says she is getting a lift chair fixed and she may be able to use the pumps after this. She treats the wound with silver alginate and ABD pads. [place]. When I first met this patient she was ambulatory and working she is no longer working and really minimally ambulatory. 1/25; 270-monthollow-up. This area is behind her left popliteal fossa. She says she has been using her compression pumps 1 hour once a day. The edema does seem to be down in her lower legs but up in her thighs. The wound itself looks somewhat better. She covers silver alginate with ABDs she has 2 use silver alginate she is buying her wound care supplies privately She tells me that she can no longer stand and get into bed indeed she could not stand up to let [place] at the wound here. She says this is been going for the last 2 weeks. She denies numbness Stemp simply states that her legs feel heavy she has no back pain. She has pain in her knees but no other radicular sounding symptoms Electronic Signature(s) Signed: 07/16/2020 5:14:25 PM By: RoLinton HamD Entered By: RoLinton Hamn 07/16/2020 17:06:23 -------------------------------------------------------------------------------- Physical Exam Details Patient Name: Date of Service: MCA LLMichail Jewels. 07/16/2020 3:00 PM Medical Record Number: [id number] Account Number: [id number] of Birth/Sex: Treating RN: [id number].o. F)Tonita PhoenixLauren Primary Care Provider:  DeFanny Bienther Clinician: Referring Provider: Treating Provider/Extender: RoScheryl Martenn Treatment: 197 Constitutional Sitting or standing Blood Pressure is within target range for patient.. Pulse regular and within target range for patient.. Marland Kitchenespirations regular, non-labored and within target range.. Temperature is normal and within the target range for the patient.. Marland Kitchenppears in no distress. Neurological No obvious sensory loss. She does not have antigravity hip flexor strength and markedly reduced hip abductors. Notes Wound exam; the area does not look a lot different but slightly more healthy looking. I do  not know that the actual size is any different. There is no evidence of surrounding infection Electronic Signature(s) Signed: 07/16/2020 5:14:25 PM By: Linton Ham MD Entered By: Linton Ham on 07/16/2020 17:07:39 -------------------------------------------------------------------------------- Physician Orders Details Patient Name: Date of Service: MCA Michail Jewels T. 07/16/2020 3:00 PM Medical Record Number: [id number] Patient Account Number: [id number] Date of Birth/Sex: Treating RN: Dec 10, 1965 (55 y.o. Tonita Phoenix, Lauren Primary Care Provider: Fanny Bien Other Clinician: Referring Provider: Treating Provider/Extender: Scheryl Marten in Treatment: [phone] Verbal / Phone Orders: No Diagnosis Coding Follow-up Appointments Other: - 2 months Bathing/ Shower/ Hygiene May shower with protection but do not get wound dressing(s) wet. Edema Control - Lymphedema / SCD / Other Bilateral Lower Extremities Lymphedema Pumps. Use Lymphedema pumps on leg(s) 2-3 times a day for 45-60 minutes. If wearing any wraps or hose, do not remove them. Continue exercising as instructed. Elevate legs to the level of the heart or above for 30 minutes daily and/or when sitting, a frequency of: Avoid standing for long periods  of time. Moisturize legs daily. Off-Loading Other: - keep pressure off of left posterior wound Home Health New wound care orders this week; continue Home Health for wound care. May utilize formulary equivalent dressing for wound treatment orders unless otherwise specified. - [place] Orders/Instructions: - Please have pt. evaluated by PT to see if she qualifies for physical therapy as she is having weakness in her legs and stating she has a hard time moving around Wound Treatment Wound #1 - Knee Wound Laterality: Left, Posterior Cleanser: Soap and Water [facility]) Every Other Day/30 Days Discharge Instructions: May shower and wash wound with dial antibacterial soap and water prior to dressing change. Cleanser: Wound Cleanser [facility]) Every Other Day/30 Days Discharge Instructions: Cleanse the wound with wound cleanser prior to applying a clean dressing using gauze sponges, not tissue or cotton balls. Prim Dressing: KerraCel Ag Gelling Fiber Dressing, 4x5 in (silver alginate) (Home Health) Every Other Day/30 Days ary Discharge Instructions: Apply silver alginate to wound bed as instructed Secondary Dressing: Woven Gauze Sponge, Non-Sterile 4x4 in [facility]) Every Other Day/30 Days Discharge Instructions: Apply over primary dressing as directed. Secondary Dressing: ABD Pad, 5x9 [facility]) Every Other Day/30 Days Discharge Instructions: Apply over primary dressing as directed. Secured With: Paper Tape, 1x10 (in/yd) (Home Health) Every Other Day/30 Days Discharge Instructions: Secure dressing with tape as directed. Electronic Signature(s) Signed: 07/16/2020 5:14:25 PM By: Linton Ham MD Signed: 07/17/2020 5:56:51 PM By: Rhae Hammock RN Entered By: Rhae Hammock on 07/16/2020 16:22:09 -------------------------------------------------------------------------------- Problem List Details Patient Name: Date of Service: MCA Michail Jewels T. 07/16/2020  3:00 PM Medical Record Number: [id number] Patient Account Number: [id number] Date of Birth/Sex: Treating RN: [birth year] (55 y.o. Tonita Phoenix, Lauren Primary Care Provider: Fanny Bien Other Clinician: Referring Provider: Treating Provider/Extender: Scheryl Marten in Treatment: 197 Active Problems ICD-10 Encounter Code Description Active Date MDM Diagnosis I87.322 Chronic venous hypertension (idiopathic) with inflammation of left lower 11/05/2016 No Yes extremity I89.0 Lymphedema, not elsewhere classified 10/09/2016 No Yes L97.221 Non-pressure chronic ulcer of left calf limited to breakdown of skin 11/05/2016 No Yes E11.622 Type 2 diabetes mellitus with other skin ulcer 10/01/2016 No Yes E66.09 Other obesity due to excess calories 10/01/2016 No Yes Inactive Problems Resolved Problems Electronic Signature(s) Signed: 07/16/2020 5:14:25 PM By: Linton Ham MD Entered By: Linton Ham on 07/16/2020 17:04:15 -------------------------------------------------------------------------------- Progress Note Details Patient Name: Date of Service: MCA  MALLOREY, ODONELL T. 07/16/2020 3:00 PM Medical Record Number: [id number] Patient Account Number: [id number] Date of Birth/Sex: Treating RN: 1965-08-24 (55 y.o. Tonita Phoenix, Lauren Primary Care Provider: Fanny Bien Other Clinician: Referring Provider: Treating Provider/Extender: Scheryl Marten in Treatment: 197 Subjective History of Present Illness (HPI) The following HPI elements were documented for the patient's wound: Location: Left popliteal fossa Duration: She was aware of the wound October 2017 Context: The development/occurrence is unclear Modifying Factors: Chronic pressure secondary to thigh pannus 10/01/16- She is here for initial evaluation of her left popliteal fossa ulcer. She states that she noticed the area in October 2017. She is unaware of actual causative  factor, states she has a history of eczema and uses a brush for bathing. She was being treated by her PCP, using Aquaphor since then, and stopped using last week. She denies ever being on an antibiotic for this ulcer. She states that this drains excessively and soaks through her clothes, she is unable to visualize this area and has a friend that helps with the dressing changes. She was recently diagnosed with diabetes; A1c in December was 9; down to 7.5-8 in February. She is being managed on oral agents. She is a non-smoker. She has never been evaluated for venous reflux or lymphedema. 10/08/16- She is here in follow up regarding her left popliteal fossa pressure ulcer. She states that she did not receive any supplies as ordered. She states that the dressing material that was ordered and placed last week was in place for several days. She states that the odor and drainage is unchanged. She states that her PCP has written for 1 month off work, starting Monday 4/23. The culture obtained last week was mixed skin flora, no identifiable pathogen. 10/15/16; difficult wound in the left popliteal fossa in the setting of morbid obesity and lymphedema. We have been using silver alginate although it is not really clear to me that this stays on for very long. She changes it once. Covering with AVD pads to try and separate wound from the surrounding pannus. A culture of this area showed both enterococcus and Enterobacter and I have her on a combination of amoxicillin and cipro. Although most Gram positives will be covered by Cipro, a big exception is Enterobacter particularly Enterobacter faecalis which generally requires ampicillin if indeed it is sensitive. A final issue is that the patient is limited by the co-pay on her insurance with regards to actual wound care supplies. I didn't get into this in much detail although he crossed my mind that we may need to bring her back more than once a week for dressing  changes. 10/22/16; difficult wound in the left popliteal fossa. I think this is a lymphedema type issue in the middle lobe folds in the back of her knee. We have been using silver alginate, ABD pads. Dimensions today are improved, base of the wound is improved. I had some thoughts about biopsying this however overall this is improved today and all put that off for now 10/29/16; difficult wound in the left popliteal fossa which I think is a pressure wound from associated large folds of lymphedema. We have been using silver alginate. 11/05/16; difficult wound in the left popliteal fossa. Using silver alginate change 3x/wk. Wound measuring slightly smaller 11/12/16; difficult wound in the setting of chronic lymphedema. Left popliteal fossa. Using so over alginate change 3 times a week, once here and wants by home health and/or the patient's friend who is a  nurse. The dimensions of this appear to be improving, the surface is certainly improved 1/59/45; Patient now has lymphedema pumps. wound is slightly reduced in width. Still using silver alginate 11/26/16; patient has lymphedema pumps although she is having trouble with them in terms of fit. Nevertheless she tells me she is using them. She is still using silver alginate as the primary dressing 12/03/16; patient has lymphedema pumps and there is been significant improvement in the swelling in the leg. We've been using silver alginate but really only making minimal improvements if any in the overall wound area although the base of the wound is a lot better than when she first came here. 12/10/16; patient has increasing pain, drainage and odor this week. States this began to get worse over the weekend. She is been using her pumps at home and the edema is certainly better in the leg. We changed to [facility] Blue last week. She has home health coming out once a week to change the dressing 12/17/16; culture I did last week showed a few MSSA. We called her in  cephalexin at 500 4 times a day. She admits to less than 100% compliance with the 4 times a day dosing. I have encouraged her today. The surrounding tenderness and erythema in this wound is a lot better. Nursing reports that the diameter of the wound is down a centimeter. Healthy-looking wound base no surrounding tenderness. We continue to use silver alginate 12/24/16; she is still completing her antibiotics apparently not taking them exactly as prescribed. The wound is perhaps marginally smaller. Surface of this continues to look not 100% viable. We have been using silver collagen. Consider changing to Iodoflex 12/31/16; she is finished her antibiotics. There is no evidence to suspect ongoing infection in the wound. We have looked back over the pictures of the wound in the left popliteal fossa. She came in with a thick adherent necrotic eschar and now we are dealing with slight surface slough. No debridement will be planned today. I'm going to try [facility] in lieu of the silver alginate starting today 01/07/17; patient continues to do well using Hydrofera Blue. Reliably using her external compression pumps twice a day 01/15/17; the patient has been using [facility] with a general improvement in her wound condition and size. However she went back to work yesterday it would seem that her use of the compression pumps this week is been intermittent and at work she is unable to change the dressings. Per our intake nurse arrived with a macerated dressing in place. 01/22/17 on evaluation today patient appears to be doing a little bit worse. She is having more discomfort and there's a erythema surrounding the wound bed behind her left knee. She states this discomfort often is associated with infection and in fact this wound looks to likely be infected. She has no fevers, chills, nausea, vomiting, or diarrhea. 02/12/17; the patient's wound is not as good as I thought I would find it. She was making a good  improvement when I saw her a month ago. She went back to work and largely she blames difficulties at work for this. Apparently during my absence she was treated with oral antibiotics starting on 8/3. I don't believe there are any cultures 02/19/17; patient arrives in clinic today with the wound is essentially unchanged in terms of dimensions and wound appearance. We have been using silver alginate. She claims compliance with the compression pumps twice a day 02/25/17; no major change in the condition of  the wound. We used Iodo flex last week with the purpose of trying to get a better-looking surface. She could not tolerate this because of discomfort. She found this especially bad when she used her compression pumps. Before this we use silver alginate again with not a lot of success. I changed her to [employer] which will certainly not have much debridement activity however I will probably change to weekly mechanical debridements. This will help with the drainage. She is now at home/on short-term disability. 03/11/17; the patient is now on disability. She is been using PolyMem AG wound for the first time looks a little better this week and dimensions have improved. She is compliant with her external compression pumps 03/18/16; the patient is now on disability at home using her palms. We've been using PolyMen AG. Wound certainly looks smaller. Arrives today with an adherent surface requiring debridement. 04/08/17; patient has not been here in 3 weeks. Using Anasept with PolyMem AG. She arrives today with an adherent surface over the wound. She did not wish debridement, there was edema fluid dripping through the wound surface even though she states she is using her external compression pumps 04/15/17; patient is using Anasept with PolyMem and AG. She arrives today telling us that her leg is been hurting since a home care nurse staff her dressing into her on Sunday therefore she could not use her compression  pumps in spite of this her dimensions appear to be better although this is a very difficult wound to measure in the popliteal fossa of the left knee 04/22/17; arrives today in clinic using Anasept and PolyMem AG. She is using her compression pumps per her description. The wound surface however once again is not going to allow healing. She still has weeping edema fluid coming through the wound. No major change in dimensions 04/29/17; arrives today with the major deterioration in the wound both in terms of dimensions and condition of the wound surface. She reports increasing pain since earlier in the week at our intake nurse reports purulent drainage on the ABD pads.this is not going in the right direction. We put Hydrofera Blue on this last week but I'm going to change back to [employer] because of the drainage. I reviewed previous cultures last done in June showed methicillin sensitive staph aureus. We had been making progress with this wound presenting initially with a necrotic wound on the popliteal fossa on the right. We managed to get a viable surface and some reduction in wound dimensions however in the past 4 weeks this is been getting gradually worse and today much worse in terms of both size and condition of the wound surface. The patient is having pain and will require empiric antibiotics because of the purulent drainage and deterioration in the wound 05/20/17; I haven't seen this patient in 3 weeks. Biopsy of this wound I did last time suggested extensive granulation tissue with collections of neutrophils lymphocytes plasma cells histiocytes and multinucleated giant cells. There was no evidence of malignancy. Deep tissue culture I did showed MSSA and I gave her Keflex when she was here last time and renewed this for a week however she is still taking the medication I'm not certain she actually took it properly.the pathologist suggested the possibility of pyoderma gangrenosum, a vasculopathy.  PAS stains and acid-fast stains were negative She also tells me that she is having mobility problems secondary to pain in both knees. She uses a walker but can barely make it to the bathroom.  It sounds as though she has a history of chronic osteoarthritis and she tells me that she had a left anterior cruciate ligament tear at some time in the past 06/10/17; this is a patient that I haven't seen in 3 weeks. She is completed her antibiotics. Biopsy suggested the possibility of infection versus pyoderma gangrenosum. I gave her 2 weeks of Keflex directed at [facility] she is repeated this.she called last week to request more antibiotics but I wanted to see her. She arrives today with her aunt. Apparently the patient has deteriorated in terms of function. According the patient and her aunt she can no longer ambulate or easily transfer. She is incontinent because she has urge incontinence etc. They requested admission to a rehabilitation facility or the hospital and then a rehabilitation facility. I was not aware that the patient was declining in terms a shin but according to our staff that's been the case they haven't seen her walk in the him any months that she's been here. I wasn't really aware of this. In fact the patient was working up until the end of August. In any case she arrives with the wound a lot worse 07/01/17; this is a patient I haven't seen in 3 weeks. The last time she was here at the request of her family member we sent her to the ER. She apparently was kept overnight. She was treated for UTI. Attempts were made to find her long-term care although there was insurance and cost issues and she is back home. She now has her aunt living with her. I am not clear what they are using to the wound on the posterior left knee/popliteal fossa. Last biopsy I did of this area did not document pyoderma which had been raised by another/previous biopsy. We are not clear today what they're actually putting on the  wound 07/15/17; she arrives today with improvement in her wound dimensions although there is still weeping edema fluid. She claims she is changing the dressing [silver alginate] twice daily. I've suggested increasing this to 3 times a day. She is changing the silver alginate twice a day 07/29/17; patient complains of pain drainage and odor coming from the wound which was different from 2 weeks ago.. We have been using silver alginate she has been compliant with her compression pumps 09/02/17; the patient continues to complain of pain and drainage. I gave her empiric antibiotics when she was last here 5 weeks ago but she says she only completed them recently. She seems confused about the time frame of her last visit here.she states she was concerned about an odor and stopped using the [facility] Blue on the wound in her left popliteal fossa about a week ago. I don't think she is dressing this with anything since. She is using her lymphedema pumps once a day.previous biopsy of done of this area showed inflammation associated with stasis dermatitis or trauma She is still not walking apparently because of severe osteoarthritis in both knees. She thinks she'll be going back to work in June and she needs to be ambulatory although I really can't see this happening. 09/16/17; the patient is been using a mixture of Hydrofera Blue and PolyMen that she had left over. I'm not really sure how reliable this is. She tells me she is using her external compression pumps once a day. She had a steroid shot in her left knee this morning 09/30/17; 2 week follow-up for this patient with a difficult wound behind her left popliteal fossa. She  has home health. They're using Hydrofera Blue. Measurement slightly smaller. She states she is using her compression pumps once a day 10/14/17; 2 week follow-up for this woman with a difficult wound with find her left popliteal fossa. She has home health. Using Hydrofera Blue.  Measurements again today slightly smaller. Most of the surface of this covered in a nonviable tightly green adherent material. Some of this may be retained Hydrofera Blue nevertheless was debrided with great difficulty. She asked me about her upcoming disability which I think happens in June. I told her that from my point of view the wound itself does not qualify her to be disabled by itself. Now I'm aware that she can no longer stand and walk. This really deteriorated quite a bit during the course of the time she is been here. I suspect this is due to severe osteoarthritis of both knees and she is seen orthopedics for this. I think this is a source of her disability but I don't really feel comfortable with this given the fact that I don't precisely know the exact diagnosis here. 10/28/17; 2 week follow-up for this woman with a difficult wound in her left popliteal fossa. This is complicated by lymphedema. She is episodic in using her compression pumps we obtained. We've been using Hydrofera Blue. Dimensions of the wound are not any different however surface of this looks a lot better than last time. Prior to last visit at which time I had the debridement the wound, dimensions were slightly smaller. The patient came in last time talking to me about disability. I sent her to see her primary physician who I thought would do a proper evaluation for this patient with regards to her disability thoughts. The patient is able to stand and transfer but is no longer ambulatory. 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 . 11/08/17; this is a 2 week follow-up for patient with a difficult wound in the left popliteal fossa complicated by lymphedema. I am not sure about her compliance with the compression pumps we've been using Hydrofera Blue for 2 months now and we have been making decent progress however once again today she arrives with 50% of the overall wound area  covered in tightly adherent black/green necrotic tissue. 12/02/17; difficult wound in the left popliteal fossa complicated by severe lymphedema. We've been using silver alginate although the patient ran out of this and she's been using Hydrofera Blue. Last time she is here she had a considerable amount of tightly adherent green necrotic tissue. Post debridement culture showed MSSA, I gave her 7 days worth of Septra DS which seems to have helped 12/24/17; almost palliative follow-up for this difficult area on the left popliteal fossa complicated by severe lymphedema. We've been using silver alginate although the patient tells me she's been using Hydrofera Blue. She does not separate the folds of this wound. She has been using her lymphedema pumps but not every day. 01/20/18 This is a difficult wound in the left popliteal fossa. Intake nurse noted some purulent drainage and necrotic material. She has severe lymphedema including the posterior thigh and the upper calf surrounding this wound and there is continued leaking edema fluid out of the wound surface. This is not going to heal like this. She states she is being compliant with the lymphedema pumps once sometimes twice a day. However this is not controlling the edema around the wound either in the calf or the posterior  thigh 02/10/18; really not making any progress here. The wound is actually larger. There is no purulent drainage. Culture I did last time was negative/multiple organisms 03/10/18; 1 month follow-up. The wound may be measures slightly smaller. We've been using care max directly on the wound surface. The area definitely looks less moist. She tells me she is also you been using her pumps religiously twice a day. In fact the area actually looks dry today. 04/12/2018; 1 month follow-up. She states after I used silver nitrate on this wound area a month ago that the pain was intolerable and continued all month. This made it too painful for her to  use the care of max reliably or to use her compression pumps. She arrives in clinic today with her wound probably twice the size. There is no purulent drainage 05/03/2018; the patient has been using her pumps reliably twice a day over the last week but she finds the combination of the pump and a dressing on the wound to be unduly painful therefore there is not been a dressing in the last week. She arrives in clinic today with the wound continuing to enlarge. She did not tolerate silver nitrate nor Kerramax directly on the wound 05/26/2018; the patient has not been using her pumps. Her wound in the left posterior popliteal fossa continues to expand. We are using silver alginate although the options for primary dressings are limited since she is paying out-of-pocket for this. Culture I did last time which was a PCR culture showed a scattered number of anaerobes low quantities. I did not think this was significant. 06/09/2018 the patient has not been using her pumps. She puts care of max in the area on the posterior popliteal fossa. She has no one to help her clean this area. Home health is apparently coming by once a week. For some reason she has to purchase a lot of her supplies herself online. She does not have a lot of financial resources. She states the pumps hurt her and caused the wound to bleed. She is continuing to deteriorate Biopsy I did last visit did not show malignancy/fungus or atypical Mycobacterium. This is a second time I have biopsied this 06/30/2018; the patient states she is using her pumps at least once a day. She put silver alginate and a rolled towel in the area. She occasionally uses kerramax and/or ABD pads. She is decided that a application of silver nitrate I did because of very friable mucosa on this sometime in September is the reason that she could not use the pumps. I did not actually research this. This is clearly not the case 1/30; the patient states she is using her pumps  once a day but sometimes for as long as 2 hours. I told her that she still needs to do this and additional time a day. She is using silver alginate. She states the [facility] hurts when she uses her pumps therefore she is not using this. She has ABD pads that she is getting from home health. Dimensions perhaps slightly improved 3/5; the patient uses her pumps once a day but she states for is sometimes as long as 2 hours. She has not managed to do this twice a day. She is using silver alginate. Separating these areas with ABDs and rolled towels. There is no change in dimensions of this wound if anything slightly larger although the surfaces look better 4/2; I do not see too much difference 1 way or the other in the area  on the left popliteal fossa. She is not using her pumps. She did describe an episode of pain and odor a week or 2 ago however she said it is mostly subsided. Still using silver alginate. 5/8; monthly follow-up for this large area in the left popliteal fossa. She is not using her compression pumps there is surrounding lymphedema from tissue in the posterior thigh in the posterior calf. Her home health nurse had called 2 weeks ago to report black drainage on the surface, she used silver alginate after that and it seems to have cleared up. 6/4; Patient is not using pumps. Finds silver alginate painful. I think she is just using ABD's in the area. She has a new wound laterally to the original wound. Leaking copious lymphedema 7/9; monthly follow-up. Patient is using her pumps about once a week finds it painful. She uses some combination of silver calcium alginate. She has to buy her own supplies [place] and she uses an ABD. She has home health but her co-pay is such that it is cheaper for her to buy her supplies online 8/6-Patient is here for her monthly follow-up, she has started using her pumps again, apparently a couple of weeks ago she had a lot of pain and could not use the pumps, she  is now using silver alginate, she states her left posterior calf wound had some drainage that was greenish now converted to serous 10/13; 53-monthfollow-up. The patient's wound is on the left popliteal fossa. Fairly substantial area in the middle of 2 pannus folds the lower part of her thigh and the upper part of her calf. She has been using silver alginate. As usual she presents with greenish drainage but the wound does not look infected 12/8; 291-monthollow-up. The patient now has 2 wounds in the popliteal fossa on the left. Fairly substantial area in the middle of 2 pannus folds in the lower part of her thigh and the upper part of her calf. She has been using silver alginate. There is a smaller wound laterally and posteriorly very difficult to see. The patient says she is using her compression pumps 3 times a week. She is not willing to separate the folds in this wound area. I am not sure I really understand the issue. 2/23; 2-109-monthllow-up. I did not see a second wound this time. But otherwise I think the area on her left popliteal fossa is unchanged. The wound is between 2 fairly substantial pannus fold in the lower part of her thigh and the upper part of her calf. She has been using silver alginate which she buys online herself. She asks me about different dressings suggested by home health. She is not using her compression pumps and she does not keep these pannus fold separated. 4/20; I follow this woman palliatively on a 2-m52-monthis. She has a chronic wound on the left popliteal fossa. This has been there for 4 years between 2 fairly substantial pannus folds on the lower part of her thigh and the upper part of her calf this drains weeping edema fluid. I biopsied this area on 2 different occasions there was no malignancy. She has external compression pumps but probably uses them maximum of 4 times a week. She is technically homebound but able to transfer herself from bed to wheelchair etc. We  have been using silver alginate for a long period of time Our intake nurses reported a greenish drainage 6/15; palliative wound on the left popliteal fossa. This is been  there for several years as noted. She has 2 fairly substantial pannus areas that encompasses 1 from the posterior thigh and the other from the upper calf. She has compression pumps that we ordered for her early on she does not use them there is always a reason today it has she had to replace her lift chair. We have been using silver alginate She has [facility] home health and there are reports that she is having bleeding from certain areas of this wound. Not exactly certain where but it apparently pools around her foot this is happened 4 times since she was here last 2 months ago 8/24; 26-monthfollow-up for chronic wound on the left popliteal fossa. This is in the setting of severe lymphedema. The patient tells me she has recently gotten a lift chair and is going to try to use her compression pumps again. We have been using silver alginate with ABDs to the wounds for a prolonged period of time. The patient is basically confined to home now. She moves around mostly by pushing herself in a seated walker. She is able to stand and transfer perhaps take a few steps. Miraculously she is managing to hang on at home 11/9; this is a patient with a chronic wound and a pannus fold in her left popliteal fossa. She has massive lymphedema. We managed to get her compression pumps but she does not really use them largely related to musculoskeletal pain. She says she is getting a lift chair fixed and she may be able to use the pumps after this. She treats the wound with silver alginate and ABD pads. She purchased this herself on line "[employer] she has home health. When I first met this patient she was ambulatory and working she is no longer working and really minimally ambulatory. 1/25; 280-monthollow-up. This area is behind her left popliteal fossa. She  says she has been using her compression pumps 1 hour once a day. The edema does seem to be down in her lower legs but up in her thighs. The wound itself looks somewhat better. She covers silver alginate with ABDs she has 2 use silver alginate she is buying her wound care supplies privately She tells me that she can no longer stand and get into bed indeed she could not stand up to let us[place]orea[place] at the wound here. She says this is been going for the last 2 weeks. She denies numbness Stemp simply states that her legs feel heavy she has no back pain. She has pain in her knees but no other radicular sounding symptoms Objective Constitutional Sitting or standing Blood Pressure is within target range for patient.. Pulse regular and within target range for patient.. Marland Kitchenespirations regular, non-labored and within target range.. Temperature is normal and within the target range for the patient.. Marland Kitchenppears in no distress. Vitals Time Taken: 3:05 PM, Height: 70 in, Weight: 484 lbs, BMI: 69.4, Temperature: 98.3 F, Pulse: 116 bpm, Respiratory Rate: 20 breaths/min, Blood Pressure: 136/50 mmHg. Neurological No obvious sensory loss. She does not have antigravity hip flexor strength and markedly reduced hip abductors. General Notes: Wound exam; the area does not look a lot different but slightly more healthy looking. I do not know that the actual size is any different. There is no evidence of surrounding infection Integumentary (Hair, Skin) Wound #1 status is Open. Original cause of wound was Gradually Appeared. The wound is located on the Left,Posterior Knee. The wound measures 3cm length x 9.5cm width x 0.1cm depth; 22.384cm^2  area and 2.238cm^3 volume. There is Fat Layer (Subcutaneous Tissue) exposed. There is no tunneling or undermining noted. There is a large amount of serous drainage noted. The wound margin is flat and intact. There is large (67-100%) **red granulation within the wound bed. There is a small  (1-33%) amount of necrotic tissue within the wound bed including Adherent Slough. Assessment Active Problems ICD-10 Chronic venous hypertension (idiopathic) with inflammation of left lower extremity Lymphedema, not elsewhere classified Non-pressure chronic ulcer of left calf limited to breakdown of skin Type 2 diabetes mellitus with other skin ulcer Other obesity due to excess calories Plan Follow-up Appointments: Other: - 2 months Bathing/ Shower/ Hygiene: May shower with protection but do not get wound dressing(s) wet. Edema Control - Lymphedema / SCD / Other: Lymphedema Pumps. Use Lymphedema pumps on leg(s) 2-3 times a day for 45-60 minutes. If wearing any wraps or hose, do not remove them. Continue exercising as instructed. Elevate legs to the level of the heart or above for 30 minutes daily and/or when sitting, a frequency of: Avoid standing for long periods of time. Moisturize legs daily. Off-Loading: Other: - keep pressure off of left posterior wound Home Health: New wound care orders this week; continue Home Health for wound care. May utilize formulary equivalent dressing for wound treatment orders unless otherwise specified. - [place] Orders/Instructions: - Please have pt. evaluated by PT to see if she qualifies for physical therapy as she is having weakness in her legs and stating she has a hard time moving around WOUND #1: - Knee Wound Laterality: Left, Posterior Cleanser: Soap and Water [facility]) Every Other Day/30 Days Discharge Instructions: May shower and wash wound with dial antibacterial soap and water prior to dressing change. Cleanser: Wound Cleanser [facility]) Every Other Day/30 Days Discharge Instructions: Cleanse the wound with wound cleanser prior to applying a clean dressing using gauze sponges, not tissue or cotton balls. Prim Dressing: KerraCel Ag Gelling Fiber Dressing, 4x5 in (silver alginate) (Home Health) Every Other Day/30  Days ary Discharge Instructions: Apply silver alginate to wound bed as instructed Secondary Dressing: Woven Gauze Sponge, Non-Sterile 4x4 in [facility]) Every Other Day/30 Days Discharge Instructions: Apply over primary dressing as directed. Secondary Dressing: ABD Pad, 5x9 [facility]) Every Other Day/30 Days Discharge Instructions: Apply over primary dressing as directed. Secured With: Paper T ape, 1x10 (in/yd) (Home Health) Every Other Day/30 Days Discharge Instructions: Secure dressing with tape as directed. 1. I am continuing with the silver alginate ABDs. I asked her before to put hand towels in this area as she does not seem to be able to manage this 2. I am uncertain about what is caused her relatively acute lower extremity weakness to the point that she can no longer bear weight. She does have proximal muscle weakness in her legs. No obvious sensory loss. She wonders whether this is simply more fluid in her thighs I am doubtful that is the etiology. I wonder about this use weakness for an occult neurologic issue here. 3. We have been following this area lady as needed/palliatively for a long period of time I biopsied this area on 2 occasions there was no malignancy. 4. I will see if we can get her physical therapy through her home health insurance. As I noted I am not completely sure of the exact etiology here but she clearly does not have antigravity strength at the hip flexors and minimally at the hip abductors Electronic Signature(s) Signed: 07/16/2020 5:14:25  PM By: Linton Ham MD Entered By: Linton Ham on 07/16/2020 17:09:28 -------------------------------------------------------------------------------- SuperBill Details Patient Name: Date of Service: Debe Coder 07/16/2020 Medical Record Number: [id number] Patient Account Number: [id number] Date of Birth/Sex: Treating RN: April 03, 1966 (55 y.o. Tonita Phoenix, Lauren Primary Care Provider: Fanny Bien Other Clinician: Referring Provider: Treating Provider/Extender: Scheryl Marten in Treatment: 197 Diagnosis Coding ICD-10 Codes Code Description [phone] Chronic venous hypertension (idiopathic) with inflammation of left lower extremity I89.0 Lymphedema, not elsewhere classified L97.221 Non-pressure chronic ulcer of left calf limited to breakdown of skin E11.622 Type 2 diabetes mellitus with other skin ulcer E66.09 Other obesity due to excess calories Facility Procedures CPT4 Code: [id number] Description: 99214 - WOUND CARE VISIT-LEV 4 EST PT Modifier: Quantity: 1 Physician Procedures : CPT4 Code Description Modifier [id number] - WC PHYS LEVEL 3 - EST PT ICD-10 Diagnosis Description I87.322 Chronic venous hypertension (idiopathic) with inflammation of left lower extremity I89.0 Lymphedema, not elsewhere classified L97.221  Non-pressure chronic ulcer of left calf limited to breakdown of skin Quantity: 1 Electronic Signature(s) Signed: 07/16/2020 5:14:25 PM By: Linton Ham MD Entered By: Linton Ham on 07/16/2020 17:09:48

## 2020-07-17 NOTE — Progress Notes (Signed)
[name], [name] ([id number]) . Visit Report for 07/16/2020 Arrival Information Details Patient Name: Date of Service: MCA VICTOIRE, LEAVY 07/16/2020 3:00 PM Medical Record Number: [id number] Patient Account Number: [id number] Date of Birth/Sex: Treating RN: [birth year] (55 y.o. [name], [name] Primary Care Kailah Pennel: Fanny Bien Other Clinician: Referring Floreine Kingdon: Treating Rickia Freeburg/Extender: Scheryl Marten in Treatment: 40 Visit Information History Since Last Visit Added or deleted any medications: No Patient Arrived: [name] Any new allergies or adverse reactions: No Arrival Time: 15:05 Had a fall or experienced change in No Accompanied By: self activities of daily living that may affect Transfer Assistance: None risk of falls: Patient Identification Verified: Yes Signs or symptoms of abuse/neglect since last visito No Secondary Verification Process Completed: Yes Hospitalized since last visit: No Patient Requires Transmission-Based Precautions: No Implantable device outside of the clinic excluding No Patient Has Alerts: No cellular tissue based products placed in the center since last visit: Has Dressing in Place as Prescribed: Yes Pain Present Now: Yes Electronic Signature(s) Signed: 07/16/2020 3:15:52 PM By: Sandre Kitty Entered By: Sandre Kitty on 07/16/2020 15:05:45 -------------------------------------------------------------------------------- Clinic Level of Care Assessment Details Patient Name: Date of Service: MCA JEILANI, PALM 07/16/2020 3:00 PM Medical Record Number: [id number] Patient Account Number: [id number] Date of Birth/Sex: Treating RN: 02-17-1966 (55 y.o. [name], [name] Primary Care Semaje Kinker: Fanny Bien Other Clinician: Referring Conita Amenta: Treating Sachi Boulay/Extender: Scheryl Marten in Treatment: 197 Clinic Level of Care Assessment Items TOOL 4 Quantity  Score X- 1 0 Use when only an EandM is performed on FOLLOW-UP visit ASSESSMENTS - Nursing Assessment / Reassessment X- 1 10 Reassessment of Co-morbidities (includes updates in patient status) X- 1 5 Reassessment of Adherence to Treatment Plan ASSESSMENTS - Wound and Skin A ssessment / Reassessment X - Simple Wound Assessment / Reassessment - one wound 1 5 []  - 0 Complex Wound Assessment / Reassessment - multiple wounds X- 1 10 Dermatologic / Skin Assessment (not related to wound area) ASSESSMENTS - Focused Assessment []  - 0 Circumferential Edema Measurements - multi extremities X- 1 10 Nutritional Assessment / Counseling / Intervention X- 1 5 Lower Extremity Assessment (monofilament, tuning fork, pulses) []  - 0 Peripheral Arterial Disease Assessment (using hand held doppler) ASSESSMENTS - Ostomy and/or Continence Assessment and Care []  - 0 Incontinence Assessment and Management []  - 0 Ostomy Care Assessment and Management (repouching, etc.) PROCESS - Coordination of Care X - Simple Patient / Family Education for ongoing care 1 15 []  - 0 Complex (extensive) Patient / Family Education for ongoing care X- 1 10 Staff obtains Programmer, systems, Records, Ware Results / Process Orders est X- 1 10 Staff telephones HHA, Nursing Homes / Clarify orders / etc []  - 0 Routine Transfer to another Facility (non-emergent condition) []  - 0 Routine Hospital Admission (non-emergent condition) []  - 0 New Admissions / Biomedical engineer / Ordering NPWT Apligraf, etc. , []  - 0 Emergency Hospital Admission (emergent condition) X- 1 10 Simple Discharge Coordination []  - 0 Complex (extensive) Discharge Coordination PROCESS - Special Needs []  - 0 Pediatric / Minor Patient Management []  - 0 Isolation Patient Management []  - 0 Hearing / Language / Visual special needs []  - 0 Assessment of Community assistance (transportation, D/C planning, etc.) []  - 0 Additional assistance / Altered  mentation []  - 0 Support Surface(s) Assessment (bed, cushion, seat, etc.) INTERVENTIONS - Wound Cleansing / Measurement X - Simple Wound Cleansing - one wound 1 5 []  - 0 Complex Wound Cleansing -  multiple wounds X- 1 5 Wound Imaging (photographs - any number of wounds) []  - 0 Wound Tracing (instead of photographs) X- 1 5 Simple Wound Measurement - one wound []  - 0 Complex Wound Measurement - multiple wounds INTERVENTIONS - Wound Dressings X - Small Wound Dressing one or multiple wounds 1 10 []  - 0 Medium Wound Dressing one or multiple wounds []  - 0 Large Wound Dressing one or multiple wounds X- 1 5 Application of Medications - topical []  - 0 Application of Medications - injection INTERVENTIONS - Miscellaneous []  - 0 External ear exam []  - 0 Specimen Collection (cultures, biopsies, blood, body fluids, etc.) []  - 0 Specimen(s) / Culture(s) sent or taken to Lab for analysis []  - 0 Patient Transfer (multiple staff / Civil Service fast streamer / Similar devices) []  - 0 Simple Staple / Suture removal (25 or less) []  - 0 Complex Staple / Suture removal (26 or more) []  - 0 Hypo / Hyperglycemic Management (close monitor of Blood Glucose) []  - 0 Ankle / Brachial Index (ABI) - do not check if billed separately X- 1 5 Vital Signs Has the patient been seen at the hospital within the last three years: Yes Total Score: 125 Level Of Care: New/Established - Level 4 Electronic Signature(s) Signed: 07/17/2020 5:56:51 PM By: Rhae Hammock RN Entered By: Rhae Hammock on 07/16/2020 16:28:34 -------------------------------------------------------------------------------- Encounter Discharge Information Details Patient Name: Date of Service: MCA [name]. 07/16/2020 3:00 PM Medical Record Number: [id number] Patient Account Number: [id number] Date of Birth/Sex: Treating RN: 08-23-1965 (55 y.o. Debby Bud Primary Care Cailee Blanke: Fanny Bien Other Clinician: Referring  Javonne Dorko: Treating Shariyah Eland/Extender: Scheryl Marten in Treatment: 197 Encounter Discharge Information Items Discharge Condition: Stable Ambulatory Status: Walker Discharge Destination: Home Transportation: Private Auto Accompanied By: self Schedule Follow-up Appointment: Yes Clinical Summary of Care: Electronic Signature(s) Signed: 07/16/2020 6:32:06 PM By: Deon Pilling Entered By: Deon Pilling on 07/16/2020 17:57:58 -------------------------------------------------------------------------------- Lower Extremity Assessment Details Patient Name: Date of Service: MCA [name], [name] 07/16/2020 3:00 PM Medical Record Number: [id number] Patient Account Number: [id number] Date of Birth/Sex: Treating RN: 12-06-1965 (55 y.o. [name] Primary Care Chayden Garrelts: Fanny Bien Other Clinician: Referring Copper Basnett: Treating Terrace Chiem/Extender: Leana Gamer Weeks in Treatment: 197 Edema Assessment Assessed: [Left: No] [Right: No] Edema: [Left: Ye] [Right: s] Calf Left: Right: Point of Measurement: From Medial Instep 53.2 cm Ankle Left: Right: Point of Measurement: From Medial Instep 30.5 cm Electronic Signature(s) Signed: 07/16/2020 5:42:27 PM By: Baruch Gouty RN, BSN Entered By: Baruch Gouty on 07/16/2020 15:23:50 -------------------------------------------------------------------------------- Multi Wound Chart Details Patient Name: Date of Service: Noralee Space Ware. 07/16/2020 3:00 PM Medical Record Number: [id number] Patient Account Number: [id number] Date of Birth/Sex: Treating RN: 09-Jul-1965 (55 y.o. [name], [name] Primary Care Josanna Hefel: Fanny Bien Other Clinician: Referring Xochilt Conant: Treating Nubia Ziesmer/Extender: Scheryl Marten in Treatment: 197 Vital Signs Height(in): 70 Pulse(bpm): 116 Weight(lbs): 484 Blood Pressure(mmHg): 136/50 Body Mass Index(BMI):  69 Temperature(F): 98.3 Respiratory Rate(breaths/min): 20 Photos: [1:No Photos Left, Posterior Knee] [N/A:N/A N/A] Wound Location: [1:Gradually Appeared] [N/A:N/A] Wounding Event: [1:Diabetic Wound/Ulcer of the Lower] [N/A:N/A] Primary Etiology: [1:Extremity Type II Diabetes, Osteoarthritis] [N/A:N/A] Comorbid History: [1:03/23/2016] [N/A:N/A] Date Acquired: [7:425] [N/A:N/A] Weeks of Treatment: [1:Open] [N/A:N/A] Wound Status: [1:3x9.5x0.1] [N/A:N/A] Measurements L x W x D (cm) [1:22.384] [N/A:N/A] A (cm) : rea [1:2.238] [N/A:N/A] Volume (cm) : [1:18.10%] [N/A:N/A] % Reduction in A rea: [1:91.80%] [N/A:N/A] % Reduction in Volume: [1:Grade 2] [N/A:N/A] Classification: [  1:Large] [N/A:N/A] Exudate A mount: [1:Serous] [N/A:N/A] Exudate Type: [1:amber] [N/A:N/A] Exudate Color: [1:Flat and Intact] [N/A:N/A] Wound Margin: [1:Large (67-100%)] [N/A:N/A] Granulation A mount: [1:Red] [N/A:N/A] Granulation Quality: [1:Small (1-33%)] [N/A:N/A] Necrotic A mount: [1:Fat Layer (Subcutaneous Tissue): Yes N/A] Exposed Structures: [1:Fascia: No Tendon: No Muscle: No Joint: No Bone: No Small (1-33%)] [N/A:N/A] Treatment Notes Electronic Signature(s) Signed: 07/16/2020 5:14:25 PM By: Linton Ham MD Signed: 07/17/2020 5:56:51 PM By: Rhae Hammock RN Entered By: Linton Ham on 07/16/2020 17:04:23 -------------------------------------------------------------------------------- Multi-Disciplinary Care Plan Details Patient Name: Date of Service: MCA [name]. 07/16/2020 3:00 PM Medical Record Number: [id number] Patient Account Number: [id number] Date of Birth/Sex: Treating RN: July 13, 1965 (55 y.o. [name], [name] Primary Care Saraia Platner: Fanny Bien Other Clinician: Referring Kya Mayfield: Treating Jeffrey Voth/Extender: Scheryl Marten in Treatment: 197 Active Inactive Wound/Skin Impairment Nursing Diagnoses: Impaired tissue  integrity Goals: Patient/caregiver will verbalize understanding of skin care regimen Date Initiated: 09/16/2017 Target Resolution Date: 08/23/2020 Goal Status: Active Ulcer/skin breakdown will heal within 14 weeks Date Initiated: 04/12/2018 Date Inactivated: 05/03/2018 Target Resolution Date: 07/13/2018 Unmet Reason: not healed, comorbid Goal Status: Unmet morbid obesity Interventions: Assess patient/caregiver ability to perform ulcer/skin care regimen upon admission and as needed Notes: Electronic Signature(s) Signed: 07/17/2020 5:56:51 PM By: Rhae Hammock RN Entered By: Rhae Hammock on 07/16/2020 16:22:24 -------------------------------------------------------------------------------- Pain Assessment Details Patient Name: Date of Service: MCA [name]. 07/16/2020 3:00 PM Medical Record Number: [id number] Patient Account Number: [id number] Date of Birth/Sex: Treating RN: May 14, 1966 (55 y.o. [name], [name] Primary Care Glora Hulgan: Fanny Bien Other Clinician: Referring Imanie Darrow: Treating Yvon Mccord/Extender: Scheryl Marten in Treatment: 197 Active Problems Location of Pain Severity and Description of Pain Patient Has Paino Yes Site Locations Pain Location: Generalized Pain, Pain in Ulcers With Dressing Change: Yes Duration of the Pain. Constant / Intermittento Constant Rate the pain. Current Pain Level: 8 Character of Pain Describe the Pain: Aching Pain Management and Medication Current Pain Management: Notes c/o pain in knees, small amouint of pain in ulcer Electronic Signature(s) Signed: 07/16/2020 5:42:27 PM By: Baruch Gouty RN, BSN Signed: 07/17/2020 5:56:51 PM By: Rhae Hammock RN Previous Signature: 07/16/2020 3:15:52 PM Version By: Sandre Kitty Entered By: Baruch Gouty on 07/16/2020 15:23:05 -------------------------------------------------------------------------------- Patient/Caregiver  Education Details Patient Name: Date of Service: MCA Brown Human 1/25/2022andnbsp3:00 PM Medical Record Number: [id number] Patient Account Number: [id number] Date of Birth/Gender: Treating RN: [birth year] (55 y.o. [name], [name] Primary Care Physician: Fanny Bien Other Clinician: Referring Physician: Treating Physician/Extender: Scheryl Marten in Treatment: 197 Education Assessment Education Provided To: Patient Education Topics Provided Wound/Skin Impairment: Methods: Explain/Verbal Responses: State content correctly Electronic Signature(s) Signed: 07/17/2020 5:56:51 PM By: Rhae Hammock RN Entered By: Rhae Hammock on 07/16/2020 16:24:27 -------------------------------------------------------------------------------- Wound Assessment Details Patient Name: Date of Service: MCA [name]. 07/16/2020 3:00 PM Medical Record Number: [id number] Patient Account Number: [id number] Date of Birth/Sex: Treating RN: 10-21-1965 (55 y.o. [name], [name] Primary Care Lorita Forinash: Fanny Bien Other Clinician: Referring Ryszard Socarras: Treating Elyana Grabski/Extender: Scheryl Marten in Treatment: 197 Wound Status Wound Number: 1 Primary Etiology: Diabetic Wound/Ulcer of the Lower Extremity Wound Location: Left, Posterior Knee Wound Status: Open Wounding Event: Gradually Appeared Comorbid History: Type II Diabetes, Osteoarthritis Date Acquired: 03/23/2016 Weeks Of Treatment: 197 Clustered Wound: No Wound Measurements Length: (cm) 3 Width: (cm) 9.5 Depth: (cm) 0.1 Area: (cm) 22.384 Volume: (cm) 2.238 % Reduction in Area: 18.1% % Reduction in Volume: 91.8%  Epithelialization: Small (1-33%) Tunneling: No Undermining: No Wound Description Classification: Grade 2 Wound Margin: Flat and Intact Exudate Amount: Large Exudate Type: Serous Exudate Color: amber Foul Odor After Cleansing:  No Slough/Fibrino Yes Wound Bed Granulation Amount: Large (67-100%) Exposed Structure Granulation Quality: Red Fascia Exposed: No Necrotic Amount: Small (1-33%) Fat Layer (Subcutaneous Tissue) Exposed: Yes Necrotic Quality: Adherent Slough Tendon Exposed: No Muscle Exposed: No Joint Exposed: No Bone Exposed: No Treatment Notes Wound #1 (Knee) Wound Laterality: Left, Posterior Cleanser Soap and Water Discharge Instruction: May shower and wash wound with dial antibacterial soap and water prior to dressing change. Wound Cleanser Discharge Instruction: Cleanse the wound with wound cleanser prior to applying a clean dressing using gauze sponges, not tissue or cotton balls. Peri-Wound Care Topical Primary Dressing KerraCel Ag Gelling Fiber Dressing, 4x5 in (silver alginate) Discharge Instruction: Apply silver alginate to wound bed as instructed Secondary Dressing Woven Gauze Sponge, Non-Sterile 4x4 in Discharge Instruction: Apply over primary dressing as directed. ABD Pad, 5x9 Discharge Instruction: Apply over primary dressing as directed. Secured With Paper Tape, 1x10 (in/yd) Discharge Instruction: Secure dressing with tape as directed. Compression Wrap Compression Stockings Add-Ons Electronic Signature(s) Signed: 07/16/2020 5:42:27 PM By: Baruch Gouty RN, BSN Signed: 07/17/2020 5:56:51 PM By: Rhae Hammock RN Previous Signature: 07/16/2020 3:15:52 PM Version By: Sandre Kitty Entered By: Baruch Gouty on 07/16/2020 15:26:03 -------------------------------------------------------------------------------- Vitals Details Patient Name: Date of Service: MCA [name]. 07/16/2020 3:00 PM Medical Record Number: [id number] Patient Account Number: [id number] Date of Birth/Sex: Treating RN: June 09, 1966 (55 y.o. [name], [name] Primary Care Malachi Suderman: Fanny Bien Other Clinician: Referring Andi Mahaffy: Treating Darrek Leasure/Extender: Scheryl Marten in Treatment: 197 Vital Signs Time Taken: 15:05 Temperature (F): 98.3 Height (in): 70 Pulse (bpm): 116 Weight (lbs): 484 Respiratory Rate (breaths/min): 20 Body Mass Index (BMI): 69.4 Blood Pressure (mmHg): 136/50 Reference Range: 80 - 120 mg / dl Electronic Signature(s) Signed: 07/16/2020 3:15:52 PM By: Sandre Kitty Entered By: Sandre Kitty on 07/16/2020 15:06:08

## 2020-09-10 ENCOUNTER — Encounter (HOSPITAL_BASED_OUTPATIENT_CLINIC_OR_DEPARTMENT_OTHER): Payer: Medicare PPO | Admitting: Internal Medicine

## 2020-10-10 ENCOUNTER — Inpatient Hospital Stay (HOSPITAL_COMMUNITY)
Admission: EM | Admit: 2020-10-10 | Discharge: 2020-12-27 | DRG: 607 | Disposition: A | Discharge status: Skilled Nursing Facility | Payer: Medicare PPO | Attending: Internal Medicine | Admitting: Internal Medicine

## 2020-10-10 ENCOUNTER — Encounter (HOSPITAL_COMMUNITY): Payer: Self-pay | Admitting: Internal Medicine

## 2020-10-10 DIAGNOSIS — L89899 Pressure ulcer of other site, unspecified stage: Secondary | ICD-10-CM | POA: Diagnosis present

## 2020-10-10 DIAGNOSIS — I890 Lymphedema, not elsewhere classified: Principal | ICD-10-CM

## 2020-10-10 DIAGNOSIS — E875 Hyperkalemia: Secondary | ICD-10-CM | POA: Diagnosis present

## 2020-10-10 DIAGNOSIS — I87399 Chronic venous hypertension (idiopathic) with other complications of unspecified lower extremity: Secondary | ICD-10-CM | POA: Diagnosis present

## 2020-10-10 DIAGNOSIS — M160 Bilateral primary osteoarthritis of hip: Secondary | ICD-10-CM | POA: Diagnosis present

## 2020-10-10 DIAGNOSIS — I878 Other specified disorders of veins: Secondary | ICD-10-CM | POA: Diagnosis present

## 2020-10-10 DIAGNOSIS — R7989 Other specified abnormal findings of blood chemistry: Secondary | ICD-10-CM | POA: Diagnosis present

## 2020-10-10 DIAGNOSIS — Z87891 Personal history of nicotine dependence: Secondary | ICD-10-CM

## 2020-10-10 DIAGNOSIS — E669 Obesity, unspecified: Secondary | ICD-10-CM | POA: Diagnosis present

## 2020-10-10 DIAGNOSIS — M170 Bilateral primary osteoarthritis of knee: Secondary | ICD-10-CM | POA: Diagnosis present

## 2020-10-10 DIAGNOSIS — E1169 Type 2 diabetes mellitus with other specified complication: Secondary | ICD-10-CM

## 2020-10-10 DIAGNOSIS — L97929 Non-pressure chronic ulcer of unspecified part of left lower leg with unspecified severity: Secondary | ICD-10-CM | POA: Diagnosis present

## 2020-10-10 DIAGNOSIS — L97919 Non-pressure chronic ulcer of unspecified part of right lower leg with unspecified severity: Secondary | ICD-10-CM | POA: Diagnosis present

## 2020-10-10 DIAGNOSIS — T148XXA Other injury of unspecified body region, initial encounter: Secondary | ICD-10-CM | POA: Diagnosis not present

## 2020-10-10 DIAGNOSIS — R5381 Other malaise: Secondary | ICD-10-CM | POA: Diagnosis present

## 2020-10-10 DIAGNOSIS — K5900 Constipation, unspecified: Secondary | ICD-10-CM | POA: Diagnosis present

## 2020-10-10 DIAGNOSIS — L0390 Cellulitis, unspecified: Secondary | ICD-10-CM | POA: Diagnosis present

## 2020-10-10 DIAGNOSIS — E119 Type 2 diabetes mellitus without complications: Secondary | ICD-10-CM

## 2020-10-10 DIAGNOSIS — L03115 Cellulitis of right lower limb: Secondary | ICD-10-CM | POA: Diagnosis present

## 2020-10-10 DIAGNOSIS — Z20822 Contact with and (suspected) exposure to covid-19: Secondary | ICD-10-CM | POA: Diagnosis present

## 2020-10-10 DIAGNOSIS — G894 Chronic pain syndrome: Secondary | ICD-10-CM | POA: Diagnosis present

## 2020-10-10 DIAGNOSIS — Z6841 Body Mass Index (BMI) 40.0 and over, adult: Secondary | ICD-10-CM

## 2020-10-10 DIAGNOSIS — R627 Adult failure to thrive: Secondary | ICD-10-CM | POA: Diagnosis present

## 2020-10-10 DIAGNOSIS — I10 Essential (primary) hypertension: Secondary | ICD-10-CM | POA: Diagnosis present

## 2020-10-10 DIAGNOSIS — R0602 Shortness of breath: Secondary | ICD-10-CM

## 2020-10-10 DIAGNOSIS — L03116 Cellulitis of left lower limb: Secondary | ICD-10-CM | POA: Diagnosis present

## 2020-10-10 DIAGNOSIS — L089 Local infection of the skin and subcutaneous tissue, unspecified: Secondary | ICD-10-CM | POA: Diagnosis not present

## 2020-10-10 DIAGNOSIS — F32A Depression, unspecified: Secondary | ICD-10-CM | POA: Diagnosis present

## 2020-10-10 DIAGNOSIS — Z79899 Other long term (current) drug therapy: Secondary | ICD-10-CM

## 2020-10-10 DIAGNOSIS — E876 Hypokalemia: Secondary | ICD-10-CM | POA: Diagnosis present

## 2020-10-10 DIAGNOSIS — Z791 Long term (current) use of non-steroidal anti-inflammatories (NSAID): Secondary | ICD-10-CM

## 2020-10-10 HISTORY — DX: Type 2 diabetes mellitus without complications: E11.9

## 2020-10-10 HISTORY — DX: Headache, unspecified: R51.9

## 2020-10-10 HISTORY — DX: Unspecified multiple injuries, initial encounter: T07.XXXA

## 2020-10-10 HISTORY — DX: Lymphedema, not elsewhere classified: I89.0

## 2020-10-10 HISTORY — DX: Other complications of anesthesia, initial encounter: T88.59XA

## 2020-10-10 HISTORY — DX: Other seasonal allergic rhinitis: J30.2

## 2020-10-10 LAB — CBC
HCT: 45.4 % (ref 36.0–46.0)
Hemoglobin: 12.8 g/dL (ref 12.0–15.0)
MCH: 29 pg (ref 26.0–34.0)
MCHC: 28.2 g/dL — ABNORMAL LOW (ref 30.0–36.0)
MCV: 102.7 fL — ABNORMAL HIGH (ref 80.0–100.0)
Platelets: 216 10*3/uL (ref 150–400)
RBC: 4.42 MIL/uL (ref 3.87–5.11)
RDW: 15.2 % (ref 11.5–15.5)
WBC: 7.8 10*3/uL (ref 4.0–10.5)
nRBC: 0 % (ref 0.0–0.2)

## 2020-10-10 LAB — BASIC METABOLIC PANEL
Anion gap: 10 (ref 5–15)
BUN: 16 mg/dL (ref 6–20)
CO2: 20 mmol/L — ABNORMAL LOW (ref 22–32)
Calcium: 8.6 mg/dL — ABNORMAL LOW (ref 8.9–10.3)
Chloride: 108 mmol/L (ref 98–111)
Creatinine, Ser: 0.88 mg/dL (ref 0.44–1.00)
GFR, Estimated: >60 mL/min (ref >60)
Glucose, Bld: 135 mg/dL — ABNORMAL HIGH (ref 70–99)
Potassium: 5.6 mmol/L — ABNORMAL HIGH (ref 3.5–5.1)
Sodium: 138 mmol/L (ref 135–145)

## 2020-10-10 LAB — DIFFERENTIAL
Abs Immature Granulocytes: 0.04 10*3/uL (ref 0.00–0.07)
Basophils Absolute: 0 10*3/uL (ref 0.0–0.1)
Basophils Relative: 0 %
Eosinophils Absolute: 0.3 10*3/uL (ref 0.0–0.5)
Eosinophils Relative: 4 %
Immature Granulocytes: 1 %
Lymphocytes Relative: 21 %
Lymphs Abs: 1.7 10*3/uL (ref 0.7–4.0)
Monocytes Absolute: 0.5 10*3/uL (ref 0.1–1.0)
Monocytes Relative: 7 %
Neutro Abs: 5.3 10*3/uL (ref 1.7–7.7)
Neutrophils Relative %: 67 %

## 2020-10-10 MED ORDER — MORPHINE SULFATE (PF) 4 MG/ML IV SOLN
6.0000 mg | Freq: Once | INTRAVENOUS | Status: AC
  Administered 2020-10-10: 6 mg via INTRAVENOUS
  Filled 2020-10-10: qty 2

## 2020-10-10 MED ORDER — OXYCODONE HCL 5 MG PO TABS
10.0000 mg | ORAL_TABLET | ORAL | Status: DC | PRN
  Administered 2020-10-10 – 2020-12-27 (×297): 10 mg via ORAL
  Filled 2020-10-10 (×304): qty 2

## 2020-10-10 MED ORDER — SODIUM ZIRCONIUM CYCLOSILICATE 10 G PO PACK
10.0000 g | PACK | Freq: Once | ORAL | Status: AC
  Administered 2020-10-10: 10 g via ORAL
  Filled 2020-10-10: qty 1

## 2020-10-10 MED ORDER — VANCOMYCIN HCL 2000 MG/400ML IV SOLN
2000.0000 mg | Freq: Two times a day (BID) | INTRAVENOUS | Status: DC
  Administered 2020-10-11: 2000 mg via INTRAVENOUS
  Filled 2020-10-10: qty 400

## 2020-10-10 MED ORDER — LABETALOL HCL 5 MG/ML IV SOLN
20.0000 mg | Freq: Once | INTRAVENOUS | Status: AC
  Administered 2020-10-10: 20 mg via INTRAVENOUS
  Filled 2020-10-10: qty 4

## 2020-10-10 MED ORDER — SODIUM CHLORIDE 0.9 % IV SOLN
2.0000 g | Freq: Three times a day (TID) | INTRAVENOUS | Status: DC
  Administered 2020-10-11 (×2): 2 g via INTRAVENOUS
  Filled 2020-10-10 (×3): qty 2

## 2020-10-10 MED ORDER — VANCOMYCIN HCL 10 G IV SOLR
2500.0000 mg | Freq: Once | INTRAVENOUS | Status: AC
  Administered 2020-10-10: 2500 mg via INTRAVENOUS
  Filled 2020-10-10: qty 2500

## 2020-10-10 MED ORDER — SODIUM CHLORIDE 0.9 % IV BOLUS
500.0000 mL | Freq: Once | INTRAVENOUS | Status: AC
  Administered 2020-10-10: 500 mL via INTRAVENOUS

## 2020-10-10 MED ORDER — CEFAZOLIN SODIUM-DEXTROSE 2-4 GM/100ML-% IV SOLN
2.0000 g | Freq: Once | INTRAVENOUS | Status: AC
  Administered 2020-10-10: 2 g via INTRAVENOUS
  Filled 2020-10-10: qty 100

## 2020-10-10 NOTE — NC FL2 (Addendum)
[place] LEVEL OF CARE SCREENING TOOL     IDENTIFICATION  Patient Name: [name] Birthdate: 1966/02/23 Sex: female Admission Date (Current Location): 10/10/2020  [facility] and [place] Number:  Herbalist and Address:  [facility],  [address] [place], [place]      Provider Number: [id number]  Attending Physician Name and Address:  Elodia Florence., *  Relative Name and Phone Number:  Deggie Jonell Cluck [phone]    Current Level of Care: Hospital Recommended Level of Care: [place] Prior Approval Number:    Date Approved/Denied:   PASRR Number: [id number]  Discharge Plan: SNF    Current Diagnoses: Patient Active Problem List   Diagnosis Date Noted  . Cellulitis 10/17/2020  . HTN (hypertension) 10/11/2020  . Wound infection 10/10/2020  . Hyperkalemia 10/10/2020  . Chronic peripheral venous hypertension with lower extremity complication 74/25/9563  . Lymphedema 12/14/2019  . Obesity 12/14/2019  . Type 2 diabetes mellitus ([place]) 12/14/2019  . S/P hysterectomy 08/13/2011    Orientation RESPIRATION BLADDER Height & Weight     Self,Time,Situation,Place  Normal Incontinent Weight: (!) 477 lb 4.7 oz (216.5 kg) Height:  5\' 10"  (177.8 cm)  BEHAVIORAL SYMPTOMS/MOOD NEUROLOGICAL BOWEL NUTRITION STATUS      Continent Diet (carb modified)  AMBULATORY STATUS COMMUNICATION OF NEEDS Skin   Extensive Assist Verbally PU Stage and Appropriate Care   PU Stage 2 Dressing: BID                   Personal Care Assistance Level of Assistance  Bathing,Feeding,Dressing Bathing Assistance: Maximum assistance Feeding assistance: Independent Dressing Assistance: Maximum assistance     Functional Limitations Info  Sight,Speech,Hearing Sight Info: Adequate Hearing Info: Adequate Speech Info: Adequate    SPECIAL CARE FACTORS FREQUENCY  PT (By licensed PT),OT (By licensed OT)     PT Frequency: 5x per  week OT Frequency: 5x per week            Contractures Contractures Info: Not present    Additional Factors Info  Code Status,Allergies Code Status Info: Full code Allergies Info: no none allergies           Current Medications (10/23/2020):  This is the current hospital active medication list Current Facility-Administered Medications  Medication Dose Route Frequency Provider Last Rate Last Admin  . 0.9 %  sodium chloride infusion   Intravenous PRN Georgette Shell, MD 10 mL/hr at 10/15/20 2301 250 mL at 10/15/20 2301  . acetaminophen (TYLENOL) tablet 1,000 mg  1,000 mg Oral QHS Gala Romney L, MD   1,000 mg at 10/22/20 2208  . acetaminophen (TYLENOL) tablet 650 mg  650 mg Oral Q6H PRN Georgette Shell, MD   650 mg at 10/21/20 1121  . cyclobenzaprine (FLEXERIL) tablet 7.5 mg  7.5 mg Oral BID PRN Alma Friendly, MD   7.5 mg at 10/23/20 0429  . enoxaparin (LOVENOX) injection 120 mg  120 mg Subcutaneous Q24H Gala Romney L, MD   120 mg at 10/22/20 0852  . ferrous sulfate tablet 325 mg  325 mg Oral Daily Elwyn Reach, MD   325 mg at 10/22/20 0855  . hydrochlorothiazide (MICROZIDE) capsule 12.5 mg  12.5 mg Oral Daily Alma Friendly, MD   12.5 mg at 10/22/20 0853  . hydrocortisone cream 1 %   Topical BID Alma Friendly, MD   Given at 10/22/20 2313  . hydrOXYzine (ATARAX/VISTARIL) tablet 10 mg  10 mg  Oral TID Georgette Shell, MD   10 mg at 10/22/20 2208  . insulin aspart (novoLOG) injection 0-20 Units  0-20 Units Subcutaneous TID WC Elwyn Reach, MD   4 Units at 10/22/20 1745  . insulin aspart (novoLOG) injection 0-5 Units  0-5 Units Subcutaneous QHS Garba, Mohammad L, MD      . iohexol (OMNIPAQUE) 350 MG/ML injection 100 mL  100 mL Intravenous Once PRN Georgette Shell, MD      . metoprolol tartrate (LOPRESSOR) tablet 12.5 mg  12.5 mg Oral BID Georgette Shell, MD   12.5 mg at 10/22/20 2208  . ondansetron (ZOFRAN) tablet 4 mg  4 mg  Oral Q6H PRN Elwyn Reach, MD       Or  . ondansetron (ZOFRAN) injection 4 mg  4 mg Intravenous Q6H PRN Gala Romney L, MD      . oxyCODONE (Oxy IR/ROXICODONE) immediate release tablet 10 mg  10 mg Oral Q4H PRN Elwyn Reach, MD   10 mg at 10/22/20 2312  . polyethylene glycol (MIRALAX / GLYCOLAX) packet 17 g  17 g Oral Daily Alma Friendly, MD   17 g at 10/21/20 0843  . potassium chloride SA (KLOR-CON) CR tablet 20 mEq  20 mEq Oral Daily Georgette Shell, MD   20 mEq at 10/22/20 0855  . senna-docusate (Senokot-S) tablet 1 tablet  1 tablet Oral BID Alma Friendly, MD   1 tablet at 10/22/20 2208     Discharge Medications: Please see discharge summary for a list of discharge medications.  Relevant Imaging Results:  Relevant Lab Results:   Additional Information [id number]  Lennart Pall, LCSW

## 2020-10-10 NOTE — Progress Notes (Signed)
Pharmacy Antibiotic Note  [name] is a 55 y.o. female admitted on 10/10/2020 with wound infection.  Pharmacy has been consulted for Vancomycin & Cefepime dosing.  Plan: Cefepime 2gm q8 Vancomycin 2500mg  x1, then 2g q12  Height: 5\' 10"  (177.8 cm) Weight: (!) 233.6 kg (515 lb) IBW/kg (Calculated) : 68.5  Temp (24hrs), Avg:98.4 F (36.9 C), Min:98.4 F (36.9 C), Max:98.4 F (36.9 C)  Recent Labs  Lab 10/10/20 1556 10/10/20 1610  WBC  --  7.8  CREATININE 0.88  --     Estimated Creatinine Clearance: 158.8 mL/min (by C-G formula based on SCr of 0.88 mg/dL).    No Known Allergies  Antimicrobials this admission: 4/21 Cefepime >>  4/21 Vancomycin >>   Dose adjustments this admission:  Microbiology results: none on admission   Thank you for allowing pharmacy to be a part of this patient's care.  Minda Ditto PharmD 10/10/2020 8:44 PM

## 2020-10-10 NOTE — ED Triage Notes (Signed)
Ems brings in pt from home. Pt reports multiple wounds that are not healing. Pt reports the wounds being treated through March but not since then. Pt unable to care for wounds at home.

## 2020-10-10 NOTE — ED Provider Notes (Signed)
[facility] DEPT Provider Note   CSN: [id number] Arrival date & time: 10/10/20  1347     History Chief Complaint  Patient presents with  . Wound Infection    [name] is a 55 y.o. female with past medical history of type II DM, obesity, lymphedema, and chronic nonpressure ulcer of calf followed by Dr. Dellia Nims, medical director for [facility], who presents the ED via EMS with concern for wound infection.  History is obtained by EMS reports that she has not received any wound care since 08/2020 and is incapable of caring for them independently.  On my examination, patient reports that she was receiving assistance with a home health aide for her months, but that was terminated last month by her medical insurance company.  She states that she has a pump for her lymphedema, but has cracked toenails and so she has not been able to use it regularly.  She also was unable to care for her dressings.  She only pivots into a rolling chair 1-2 times a per day and does not ambulate.  She states that given her morbid obesity she is very limited with what she can do.  She also cannot even cook anymore which leads her to eating processed foods which further exacerbates her weight problem.  She tells me that she has multiple wounds over her lower legs bilaterally as well as her left upper leg.  She has been unable to change her pants for the past 5 days and has been urinating on herself given inability to get up and go to the bathroom.  She states that the urine is stinging her wounds.  She has had infections involving her wounds in the past and states that the wounds on the back of her left thigh have felt that way for the past 5 days.  She states that they have been getting increasingly painful.  She denies any fevers, chills, or other symptoms.   HPI     Past Medical History:  Diagnosis Date  . Arthritis    knees, hips, tx with otc med  .  Depression    History in 2004, no current prob, no meds    Patient Active Problem List   Diagnosis Date Noted  . S/P hysterectomy 08/13/2011    Past Surgical History:  Procedure Laterality Date  . arthroscopic knee  1998   right knee  . Excision of 4 areas of hidradenitis in the right axilla   1986 x 04/25/2004   x 2  . OVARIAN CYST REMOVAL  08/11/2011   Procedure: OVARIAN CYSTECTOMY;  Surgeon: Marvene Staff, MD;  Location: [facility];  Service: Gynecology;  Laterality: Left;  . TONSILLECTOMY     as 55 yrs old  . WISDOM TOOTH EXTRACTION       OB History   No obstetric history on file.     No family history on file.  Social History   Tobacco Use  . Smoking status: Passive Smoke Exposure - Never Smoker  . Smokeless tobacco: Never Used  Substance Use Topics  . Alcohol use: Yes    Comment: pt states drinks " 2-3 bottles of wine per week"  . Drug use: No    Home Medications Prior to Admission medications   Medication Sig Start Date End Date Taking? Authorizing Provider  acetaminophen (TYLENOL) 325 MG tablet Take 1,300 mg by mouth See admin instructions. 1300mg  with first meal   Yes [provider]  acetaminophen (TYLENOL) 650 MG CR tablet Take 3,250 mg by mouth 2 (two) times daily with a meal.   Yes [provider]  diphenhydramine-acetaminophen (TYLENOL PM) 25-500 MG TABS tablet Take 5 tablets by mouth See admin instructions. With 2nd meal   Yes [provider]  ferrous sulfate 325 (65 FE) MG tablet Take 325 mg by mouth daily. 09/20/20  Yes [provider]  ibuprofen (ADVIL) 200 MG tablet Take 800 mg by mouth 2 (two) times daily with a meal.   Yes [provider]    Allergies    Patient has no known allergies.  Review of Systems   Review of Systems  All other systems reviewed and are negative.   Physical Exam Updated Vital Signs BP (!) 179/91   Pulse (!) 102   Temp 98.4 F (36.9 C) (Oral)   Resp 18   SpO2 99%    Physical Exam Vitals and nursing note reviewed. Exam conducted with a chaperone present.  Constitutional:      General: She is not in acute distress.    Appearance: She is obese. She is not toxic-appearing.  HENT:     Head: Normocephalic and atraumatic.  Eyes:     General: No scleral icterus.    Conjunctiva/sclera: Conjunctivae normal.  Cardiovascular:     Rate and Rhythm: Regular rhythm. Tachycardia present.     Pulses: Normal pulses.  Pulmonary:     Effort: Pulmonary effort is normal. No respiratory distress.  Skin:    General: Skin is dry.     Findings: Erythema present.     Comments: Multiple areas of skin breakdown over legs bilaterally.  The popliteal region of left leg is malodorous.  Erythema and induration noted.  Also appears consistent with intertrigo.  Yellowish staining in old dressings (see picture).  Chronic skin changes also noted due to her chronic lymphedema.  Neurological:     Mental Status: She is alert and oriented to person, place, and time.     GCS: GCS eye subscore is 4. GCS verbal subscore is 5. GCS motor subscore is 6.  Psychiatric:        Mood and Affect: Mood normal.        Behavior: Behavior normal.        Thought Content: Thought content normal.           ED Results / Procedures / Treatments   Labs (all labs ordered are listed, but only abnormal results are displayed) Labs Reviewed  SARS CORONAVIRUS 2 (TAT 6-24 HRS)  CBC WITH DIFFERENTIAL/PLATELET  BASIC METABOLIC PANEL    EKG None  Radiology No results found.  Procedures Procedures   Medications Ordered in ED Medications  ceFAZolin (ANCEF) IVPB 2g/100 mL premix (has no administration in time range)  sodium chloride 0.9 % bolus 500 mL (has no administration in time range)    ED Course  I have reviewed the triage vital signs and the nursing notes.  Pertinent labs & imaging results that were available during my care of the patient were reviewed by me and considered in my  medical decision making (see chart for details).    MDM Rules/Calculators/A&P                          [name] was evaluated in Emergency Department on 10/10/2020 for the symptoms described in the history of present illness. She was evaluated in the context of the [place] COVID-19  pandemic, which necessitated consideration that the patient might be at risk for infection with the SARS-CoV-2 virus that causes COVID-19. Institutional protocols and algorithms that pertain to the evaluation of patients at risk for COVID-19 are in a state of rapid change based on information released by regulatory bodies including the CDC and federal and state organizations. These policies and algorithms were followed during the patient's care in the ED.  I personally reviewed patient's medical chart and all notes from triage and staff during today's encounter. I have also ordered and reviewed all labs and imaging that I felt to be medically necessary in the evaluation of this patient's complaints and with consideration of their physical exam. If needed, translation services were available and utilized.   Patient with chronic wounds and multiple areas of skin breakdown.  She has old dressings on her legs bilaterally and they are malodorous and erythematous.  Mild surrounding erythema and induration.  Concern for cellulitis versus intertrigo.  Will provide 500 mL IV bolus given tachycardia in the ED and provide dose of IV Ancef given concern for soft tissue infection.    She is incapable of caring for herself at home and her insurance is no longer covering her for home health aide.  This is not a sustainable plan and she ultimately will need placement with assisted living or skilled nursing facility. I believe that she would benefit from wound care consult as well as continued antibiotics and/or anti-fungal cream for intertrigo in interim.    Final Clinical Impression(s) / ED Diagnoses Final diagnoses:  Wound  infection    Rx / DC Orders ED Discharge Orders    None       Reita Chard 10/10/20 1605    Lacretia Leigh, MD 10/15/20 [phone]

## 2020-10-10 NOTE — H&P (Signed)
History and Physical   [name] DOB: August 22, 1965 DOA: 10/10/2020  Referring MD/NP/PA: Dr. Vallery Ridge  PCP: Fanny Bien, MD   Outpatient Specialists: Wound care center  Patient coming from: Home  Chief Complaint: Multiple wounds and pain  HPI: [name] is a 55 y.o. female with medical history significant of morbid obesity, depression, hypertension, diabetes, lymphedema chronically, who has multiple open wounds in her behind legs as well as the calf area who is to have 3 times a week home health for wound care.  She also goes to the wound care center to see Dr. Dellia Nims.  Patient's insurance apparently decreased the visits to only once a week.  And patient has completely fallen off wound care since March 22.  She was attempting to do it by herself but could not keep up.  She came in with some malodorous discharge from the area.  Worsening breakdown.  Patient is unable to ambulate.  She is having some occasional fever with chills.  She is also failing to thrive at home.  She is being admitted to the hospital now with infected wound and failure to thrive.  Patient most likely will require going to skilled nursing facility for wound care...  ED Course: Temperature 98.4 blood pressure 193/102, pulse 112 respirate 28 oxygen sat 94% on room air.  CBC largely within normal.  Chemistry showed a potassium of 5.6 CO2 20 with creatinine 0.88 calcium 8.6.  Viral screen is currently pending.  Cultures were obtained and patient is being admitted for IV antibiotics and possible placement  Review of Systems: As per HPI otherwise 10 point review of systems negative.    Past Medical History:  Diagnosis Date  . Arthritis    knees, hips, tx with otc med  . Depression    History in 2004, no current prob, no meds    Past Surgical History:  Procedure Laterality Date  . arthroscopic knee  1998   right knee  . Excision of 4 areas of hidradenitis in the right axilla   1986 x  04/25/2004   x 2  . OVARIAN CYST REMOVAL  08/11/2011   Procedure: OVARIAN CYSTECTOMY;  Surgeon: Marvene Staff, MD;  Location: [facility];  Service: Gynecology;  Laterality: Left;  . TONSILLECTOMY     as 55 yrs old  . WISDOM TOOTH EXTRACTION       reports that she is a non-smoker but has been exposed to tobacco smoke. She has been exposed to tobacco smoke for the past 15.00 years. She has never used smokeless tobacco. She reports current alcohol use. She reports that she does not use drugs.  No Known Allergies  No family history on file.   Prior to Admission medications   Medication Sig Start Date End Date Taking? Authorizing Provider  acetaminophen (TYLENOL) 325 MG tablet Take 1,300 mg by mouth See admin instructions. 1300mg  with first meal   Yes [provider]  acetaminophen (TYLENOL) 650 MG CR tablet Take 3,250 mg by mouth 2 (two) times daily with a meal.   Yes [provider]  diphenhydramine-acetaminophen (TYLENOL PM) 25-500 MG TABS tablet Take 5 tablets by mouth See admin instructions. With 2nd meal   Yes [provider]  ferrous sulfate 325 (65 FE) MG tablet Take 325 mg by mouth daily. 09/20/20  Yes [provider]  ibuprofen (ADVIL) 200 MG tablet Take 800 mg by mouth 2 (two) times daily with a meal.   Yes [provider]  Physical Exam: Vitals:   10/10/20 1643 10/10/20 1700 10/10/20 1800 10/10/20 1830  BP: (!) 181/103 (!) 193/102 (!) 158/104 126/84  Pulse: (!) 103 (!) 102 (!) 107 (!) 103  Resp: 18 18 10 18   Temp:      TempSrc:      SpO2: 100% 100% 100% 100%      Constitutional: Morbidly obese in no acute distress Vitals:   10/10/20 1643 10/10/20 1700 10/10/20 1800 10/10/20 1830  BP: (!) 181/103 (!) 193/102 (!) 158/104 126/84  Pulse: (!) 103 (!) 102 (!) 107 (!) 103  Resp: 18 18 10 18   Temp:      TempSrc:      SpO2: 100% 100% 100% 100%   Eyes: PERRL, lids and conjunctivae normal ENMT: Mucous membranes are moist.  Posterior pharynx clear of any exudate or lesions.Normal dentition.  Neck: normal, supple, no masses, no thyromegaly Respiratory: clear to auscultation bilaterally, no wheezing, no crackles. Normal respiratory effort. No accessory muscle use.  Cardiovascular: Regular rate and rhythm, no murmurs / rubs / gallops. No extremity edema. 2+ pedal pulses. No carotid bruits.  Abdomen: no tenderness, no masses palpated. No hepatosplenomegaly. Bowel sounds positive.  Musculoskeletal: no clubbing / cyanosis. No joint deformity upper and lower extremities. Good ROM, no contractures. Normal muscle tone.  Skin: Multiple skin breakdowns, dry skin lower extremity with scaling excoriation, alert none decubitus ulcer in the calf region especially on the left, oozing malodorous discharge.  Bilateral multiple areas of skin breakdown with erythema and induration as well as erythema and intertrigo. Neurologic: CN 2-12 grossly intact. Sensation intact, DTR normal. Strength 5/5 in all 4.  Psychiatric: Normal judgment and insight. Alert and oriented x 3. Normal mood.     Labs on Admission: I have personally reviewed following labs and imaging studies  CBC: Recent Labs  Lab 10/10/20 1610  WBC 7.8  NEUTROABS 5.3  HGB 12.8  HCT 45.4  MCV 102.7*  PLT 409   Basic Metabolic Panel: Recent Labs  Lab 10/10/20 1556  NA 138  K 5.6*  CL 108  CO2 20*  GLUCOSE 135*  BUN 16  CREATININE 0.88  CALCIUM 8.6*   GFR: CrCl cannot be calculated (Unknown ideal weight.). Liver Function Tests: No results for input(s): AST, ALT, ALKPHOS, BILITOT, PROT, ALBUMIN in the last 168 hours. No results for input(s): LIPASE, AMYLASE in the last 168 hours. No results for input(s): AMMONIA in the last 168 hours. Coagulation Profile: No results for input(s): INR, PROTIME in the last 168 hours. Cardiac Enzymes: No results for input(s): CKTOTAL, CKMB, CKMBINDEX, TROPONINI in the last 168 hours. BNP (last 3 results) No results for  input(s): PROBNP in the last 8760 hours. HbA1C: No results for input(s): HGBA1C in the last 72 hours. CBG: No results for input(s): GLUCAP in the last 168 hours. Lipid Profile: No results for input(s): CHOL, HDL, LDLCALC, TRIG, CHOLHDL, LDLDIRECT in the last 72 hours. Thyroid Function Tests: No results for input(s): TSH, T4TOTAL, FREET4, T3FREE, THYROIDAB in the last 72 hours. Anemia Panel: No results for input(s): VITAMINB12, FOLATE, FERRITIN, TIBC, IRON, RETICCTPCT in the last 72 hours. Urine analysis:    Component Value Date/Time   COLORURINE AMBER (A) 06/10/2017 1825   APPEARANCEUR HAZY (A) 06/10/2017 1825   LABSPEC 1.033 (H) 06/10/2017 1825   PHURINE 5.0 06/10/2017 1825   GLUCOSEU NEGATIVE 06/10/2017 1825   HGBUR LARGE (A) 06/10/2017 1825   BILIRUBINUR NEGATIVE 06/10/2017 1825   KETONESUR NEGATIVE 06/10/2017 1825   PROTEINUR 30 (A)  06/10/2017 1825   UROBILINOGEN 0.2 08/13/2011 0624   NITRITE POSITIVE (A) 06/10/2017 1825   LEUKOCYTESUR LARGE (A) 06/10/2017 1825   Sepsis Labs: @LABRCNTIP (procalcitonin:4,lacticidven:4) )No results found for this or any previous visit (from the past 240 hour(s)).   Radiological Exams on Admission: No results found.  EKG: Independently reviewed.  Sinus rhythm no significant findings  Assessment/Plan Principal Problem:   Wound infection Active Problems:   Hyperkalemia   Chronic peripheral venous hypertension with lower extremity complication   Lymphedema   Obesity   Type 2 diabetes mellitus ([place])     #1 infected wound: Appears to be infected with malodorous, discharge.  Initiate IV vancomycin and cefepime.  May consider ID consultation in the morning.  Wound care consult.  Patient may require long-term placement for the purpose of wound care.  #2 hyperkalemia: Potassium of 5.6.  Continue to use Lokelma for potassium management  #3 morbidly obese: We will need a bariatric bed.  #4 type 2 diabetes: Sliding scale insulin with chronic  management.  #5 lymphedema: Patient has a lymphedema pump that she is unable to use at the moment due to cracked toenails.  Defer to wound care.  #6 essential hypertension: Blood pressure markedly elevated.  Restart home regimen and adjust as needed.   DVT prophylaxis: Lovenox Code Status: Full code Family Communication: No family at bedside Disposition Plan: Possibly skilled nursing facility Consults called: Wound care consult Admission status: Inpatient  Severity of Illness: The appropriate patient status for this patient is INPATIENT. Inpatient status is judged to be reasonable and necessary in order to provide the required intensity of service to ensure the patient's safety. The patient's presenting symptoms, physical exam findings, and initial radiographic and laboratory data in the context of their chronic comorbidities is felt to place them at high risk for further clinical deterioration. Furthermore, it is not anticipated that the patient will be medically stable for discharge from the hospital within 2 midnights of admission. The following factors support the patient status of inpatient.   " The patient's presenting symptoms include Calf ulcer. " The worrisome physical exam findings include lymphedema with cracked skin. " The initial radiographic and laboratory data are worrisome because of hyperkalemia. " The chronic co-morbidities include morbid obesity.   * I certify that at the point of admission it is my clinical judgment that the patient will require inpatient hospital care spanning beyond 2 midnights from the point of admission due to high intensity of service, high risk for further deterioration and high frequency of surveillance required.Barbette Merino MD [facility] Pager [phone]  If 7PM-7AM, please contact night-coverage [email or web address] Password [email or web address]  10/10/2020, 7:10 PM

## 2020-10-10 NOTE — TOC Initial Note (Signed)
Transition of Care [facility]) - Initial/Assessment Note    Patient Details  Name: [name] MRN: [id number] Date of Birth: [birth year]  Transition of Care [facility]) CM/SW Contact:    Erenest Rasher, RN Phone Number: [phone] 10/10/2020, 9:16 PM  Clinical Narrative:                 TOC CM spoke to pt and aunt, Deggie at bedside, states she lives at home alone. Her HH ended on 09/16/2020. They were coming out to do dressing changes on her leg wounds but insurance coverage ended for [facility]. States she was unable to do dressing changes at home. She able to move around in home on her Rollator. Has lift chair, bedside commode and rollator in home. Gave permission to create FL2 and fax referral out for SNF rehab. States she prefers to be close to [place] in [place] area. Waiting PT evaluation.   Expected Discharge Plan: Skilled Nursing Facility Barriers to Discharge: Continued Medical Work up   Patient Goals and CMS Choice Patient states their goals for this hospitalization and ongoing recovery are:: want to be more mobile and independent in my home CMS Medicare.gov Compare Post Acute Care list provided to:: Patient Choice offered to / list presented to : Patient  Expected Discharge Plan and Services Expected Discharge Plan: [place] In-house Referral: Clinical Social Work Discharge Planning Services: CM Consult Post Acute Care Choice: [facility] Living arrangements for the past 2 months: Apartment                                      Prior Living Arrangements/Services Living arrangements for the past 2 months: Apartment Lives with:: Self Patient language and need for interpreter reviewed:: Yes Do you feel safe going back to the place where you live?: No   requesting rehab  Need for Family Participation in Patient Care: Yes (Comment) Care giver support system in place?: Yes (comment) Current home services: DME (power lift chair, rollator,  bedside commode) Criminal Activity/Legal Involvement Pertinent to Current Situation/Hospitalization: No - Comment as needed  Activities of Daily Living Home Assistive Devices/Equipment: Eyeglasses,Walker (specify type),Bedside commode/3-in-1 (reading glasses, rolatro walker, power lift chair, chuck pads) ADL Screening (condition at time of admission) Patient's cognitive ability adequate to safely complete daily activities?: Yes Is the patient deaf or have difficulty hearing?: No Does the patient have difficulty seeing, even when wearing glasses/contacts?: No Does the patient have difficulty concentrating, remembering, or making decisions?: No Patient able to express need for assistance with ADLs?: Yes Does the patient have difficulty dressing or bathing?: Yes Independently performs ADLs?: No Communication: Independent Dressing (OT): Needs assistance Is this a change from baseline?: Pre-admission baseline Grooming: Independent Feeding: Independent Bathing: Needs assistance Is this a change from baseline?: Pre-admission baseline Toileting: Needs assistance Is this a change from baseline?: Pre-admission baseline In/Out Bed: Needs assistance Is this a change from baseline?: Pre-admission baseline Walks in Home: Dependent Is this a change from baseline?: Pre-admission baseline Does the patient have difficulty walking or climbing stairs?: Yes Weakness of Legs: Both Weakness of Arms/Hands: Left (left wrist locks up)  Permission Sought/Granted Permission sought to share information with : Case Manager,Facility Contact Representative,PCP,Family Supports Permission granted to share information with : Yes, Verbal Permission Granted  Share Information with NAME: Deggie Calbreath  Permission granted to share info w AGENCY: SNF rehab  Permission granted to share  info w Relationship: aunt  Permission granted to share info w Contact Information: [phone]  Emotional Assessment Appearance::  Appears stated age,Other (Comment Required Attitude/Demeanor/Rapport: Gracious Affect (typically observed): Accepting Orientation: : Oriented to Self,Oriented to Place,Oriented to  Time,Oriented to Situation   Psych Involvement: No (comment)  Admission diagnosis:  Wound infection [T14.8XXA, L08.9] Patient Active Problem List   Diagnosis Date Noted  . Wound infection 10/10/2020  . Hyperkalemia 10/10/2020  . Chronic peripheral venous hypertension with lower extremity complication 44/51/4604  . Lymphedema 12/14/2019  . Obesity 12/14/2019  . Type 2 diabetes mellitus ([place]) 12/14/2019  . S/P hysterectomy 08/13/2011   PCP:  Fanny Bien, MD Pharmacy:   [id number] - [address], [address] [place] [address] Phone: [phone] Fax: [phone]     Social Determinants of Health (SDOH) Interventions    Readmission Risk Interventions No flowsheet data found.

## 2020-10-10 NOTE — ED Provider Notes (Signed)
Received signout from previous provider, please see his note for complete H&P.  This is a 55 year old female who is morbidly obese presenting with significant chronic wounds and multiple areas of skin breakdown that has now become more malodorous and erythematous concerning for cellulitis.  Due to her body habitus patient is unable to care for herself and is mostly bedbound.  She was found to be slightly tachycardic with a heart rate of 103, IV fluids given.  Patient also received IV Ancef.  6:00 PM TOC have evaluated pt, recommend bariatric bed, and PT to eval to help facilitate placement.  Pt is currently receiving IV abx.  She is hypertensive with BP 193/102, and is hyperkalemic with K+ 5.6 but no EKG changes.  Lokelma given for hyperkalemia, and labetalol along with pain medication for elevated BP.  Will consult medicine for admission.   6:39 PM Appreciate consultation from [facility] Dr. Jonelle Sidle who agrees to admit pt for infected non healing ulcers, and to help with ultimate placement as pt is now unable to care for herself at home.   BP 126/84   Pulse (!) 103   Temp 98.4 F (36.9 C) (Oral)   Resp 18   SpO2 100%   Results for orders placed or performed during the hospital encounter of 16/10/96  Basic metabolic panel  Result Value Ref Range   Sodium 138 135 - 145 mmol/L   Potassium 5.6 (H) 3.5 - 5.1 mmol/L   Chloride 108 98 - 111 mmol/L   CO2 20 (L) 22 - 32 mmol/L   Glucose, Bld 135 (H) 70 - 99 mg/dL   BUN 16 6 - 20 mg/dL   Creatinine, Ser 0.88 0.44 - 1.00 mg/dL   Calcium 8.6 (L) 8.9 - 10.3 mg/dL   GFR, Estimated >60 >60 mL/min   Anion gap 10 5 - 15  CBC  Result Value Ref Range   WBC 7.8 4.0 - 10.5 K/uL   RBC 4.42 3.87 - 5.11 MIL/uL   Hemoglobin 12.8 12.0 - 15.0 g/dL   HCT 45.4 36.0 - 46.0 %   MCV 102.7 (H) 80.0 - 100.0 fL   MCH 29.0 26.0 - 34.0 pg   MCHC 28.2 (L) 30.0 - 36.0 g/dL   RDW 15.2 11.5 - 15.5 %   Platelets 216 150 - 400 K/uL   nRBC 0.0 0.0 - 0.2 %   Differential  Result Value Ref Range   Neutrophils Relative % 67 %   Neutro Abs 5.3 1.7 - 7.7 K/uL   Lymphocytes Relative 21 %   Lymphs Abs 1.7 0.7 - 4.0 K/uL   Monocytes Relative 7 %   Monocytes Absolute 0.5 0.1 - 1.0 K/uL   Eosinophils Relative 4 %   Eosinophils Absolute 0.3 0.0 - 0.5 K/uL   Basophils Relative 0 %   Basophils Absolute 0.0 0.0 - 0.1 K/uL   Immature Granulocytes 1 %   Abs Immature Granulocytes 0.04 0.00 - 0.07 K/uL   No results found.    Domenic Moras, PA-C 10/10/20 1840    Charlesetta Shanks, MD 10/11/20 [id number]

## 2020-10-11 ENCOUNTER — Other Ambulatory Visit: Payer: Self-pay

## 2020-10-11 DIAGNOSIS — T148XXA Other injury of unspecified body region, initial encounter: Secondary | ICD-10-CM | POA: Diagnosis not present

## 2020-10-11 DIAGNOSIS — L089 Local infection of the skin and subcutaneous tissue, unspecified: Secondary | ICD-10-CM | POA: Diagnosis not present

## 2020-10-11 DIAGNOSIS — I10 Essential (primary) hypertension: Secondary | ICD-10-CM | POA: Diagnosis present

## 2020-10-11 LAB — CBC
HCT: 40.2 % (ref 36.0–46.0)
HCT: 40.3 % (ref 36.0–46.0)
Hemoglobin: 11.9 g/dL — ABNORMAL LOW (ref 12.0–15.0)
Hemoglobin: 11.6 g/dL — ABNORMAL LOW (ref 12.0–15.0)
MCH: 29.5 pg (ref 26.0–34.0)
MCH: 29.4 pg (ref 26.0–34.0)
MCHC: 29.6 g/dL — ABNORMAL LOW (ref 30.0–36.0)
MCHC: 28.8 g/dL — ABNORMAL LOW (ref 30.0–36.0)
MCV: 99.8 fL (ref 80.0–100.0)
MCV: 102 fL — ABNORMAL HIGH (ref 80.0–100.0)
Platelets: 246 10*3/uL (ref 150–400)
Platelets: 244 10*3/uL (ref 150–400)
RBC: 4.03 MIL/uL (ref 3.87–5.11)
RBC: 3.95 MIL/uL (ref 3.87–5.11)
RDW: 15.1 % (ref 11.5–15.5)
RDW: 15.2 % (ref 11.5–15.5)
WBC: 7.8 10*3/uL (ref 4.0–10.5)
WBC: 7.9 10*3/uL (ref 4.0–10.5)
nRBC: 0 % (ref 0.0–0.2)
nRBC: 0 % (ref 0.0–0.2)

## 2020-10-11 LAB — CREATININE, SERUM
Creatinine, Ser: 0.8 mg/dL (ref 0.44–1.00)
GFR, Estimated: >60 mL/min (ref >60)

## 2020-10-11 LAB — COMPREHENSIVE METABOLIC PANEL
ALT: 12 U/L (ref 0–44)
AST: 17 U/L (ref 15–41)
Albumin: 3.2 g/dL — ABNORMAL LOW (ref 3.5–5.0)
Alkaline Phosphatase: 56 U/L (ref 38–126)
Anion gap: 11 (ref 5–15)
BUN: 15 mg/dL (ref 6–20)
CO2: 21 mmol/L — ABNORMAL LOW (ref 22–32)
Calcium: 8.6 mg/dL — ABNORMAL LOW (ref 8.9–10.3)
Chloride: 109 mmol/L (ref 98–111)
Creatinine, Ser: 0.79 mg/dL (ref 0.44–1.00)
GFR, Estimated: >60 mL/min (ref >60)
Glucose, Bld: 152 mg/dL — ABNORMAL HIGH (ref 70–99)
Potassium: 4 mmol/L (ref 3.5–5.1)
Sodium: 141 mmol/L (ref 135–145)
Total Bilirubin: 0.8 mg/dL (ref 0.3–1.2)
Total Protein: 7 g/dL (ref 6.5–8.1)

## 2020-10-11 LAB — SARS CORONAVIRUS 2 (TAT 6-24 HRS): SARS Coronavirus 2: NEGATIVE

## 2020-10-11 LAB — HEMOGLOBIN A1C
Hgb A1c MFr Bld: 6.5 % — ABNORMAL HIGH (ref 4.8–5.6)
Mean Plasma Glucose: 139.85 mg/dL

## 2020-10-11 LAB — GLUCOSE, CAPILLARY
Glucose-Capillary: 148 mg/dL — ABNORMAL HIGH (ref 70–99)
Glucose-Capillary: 149 mg/dL — ABNORMAL HIGH (ref 70–99)
Glucose-Capillary: 150 mg/dL — ABNORMAL HIGH (ref 70–99)
Glucose-Capillary: 177 mg/dL — ABNORMAL HIGH (ref 70–99)
Glucose-Capillary: 180 mg/dL — ABNORMAL HIGH (ref 70–99)

## 2020-10-11 LAB — HIV ANTIBODY (ROUTINE TESTING W REFLEX): HIV Screen 4th Generation wRfx: NONREACTIVE

## 2020-10-11 MED ORDER — LIP MEDEX EX OINT
TOPICAL_OINTMENT | CUTANEOUS | Status: AC
  Filled 2020-10-11: qty 7

## 2020-10-11 MED ORDER — ACETAMINOPHEN 500 MG PO TABS
1000.0000 mg | ORAL_TABLET | Freq: Every day | ORAL | Status: DC
  Administered 2020-10-11 – 2020-12-27 (×78): 1000 mg via ORAL
  Filled 2020-10-11 (×79): qty 2

## 2020-10-11 MED ORDER — INSULIN ASPART 100 UNIT/ML ~~LOC~~ SOLN
0.0000 [IU] | Freq: Three times a day (TID) | SUBCUTANEOUS | Status: DC
  Administered 2020-10-11: 4 [IU] via SUBCUTANEOUS
  Administered 2020-10-11 (×2): 3 [IU] via SUBCUTANEOUS
  Administered 2020-10-12 – 2020-10-19 (×20): 4 [IU] via SUBCUTANEOUS
  Administered 2020-10-19: 3 [IU] via SUBCUTANEOUS
  Administered 2020-10-19 – 2020-10-21 (×5): 4 [IU] via SUBCUTANEOUS
  Administered 2020-10-21: 3 [IU] via SUBCUTANEOUS
  Administered 2020-10-21 – 2020-10-22 (×4): 4 [IU] via SUBCUTANEOUS
  Administered 2020-10-23: 3 [IU] via SUBCUTANEOUS
  Administered 2020-10-23: 7 [IU] via SUBCUTANEOUS
  Administered 2020-10-23 – 2020-10-24 (×2): 4 [IU] via SUBCUTANEOUS
  Administered 2020-10-24: 3 [IU] via SUBCUTANEOUS
  Administered 2020-10-24: 4 [IU] via SUBCUTANEOUS
  Administered 2020-10-25: 7 [IU] via SUBCUTANEOUS
  Administered 2020-10-25 (×2): 4 [IU] via SUBCUTANEOUS
  Administered 2020-10-26: 7 [IU] via SUBCUTANEOUS
  Administered 2020-10-26 – 2020-10-28 (×6): 4 [IU] via SUBCUTANEOUS
  Administered 2020-10-28: 7 [IU] via SUBCUTANEOUS
  Administered 2020-10-28 – 2020-10-29 (×3): 4 [IU] via SUBCUTANEOUS
  Administered 2020-10-29: 7 [IU] via SUBCUTANEOUS
  Administered 2020-10-30 (×2): 4 [IU] via SUBCUTANEOUS
  Administered 2020-10-30: 5 [IU] via SUBCUTANEOUS
  Administered 2020-10-31 (×2): 4 [IU] via SUBCUTANEOUS
  Administered 2020-10-31: 5 [IU] via SUBCUTANEOUS
  Administered 2020-11-01: 4 [IU] via SUBCUTANEOUS

## 2020-10-11 MED ORDER — DIPHENHYDRAMINE-APAP (SLEEP) 25-500 MG PO TABS: 5.0000 | ORAL_TABLET | ORAL | Status: DC

## 2020-10-11 MED ORDER — SODIUM CHLORIDE 0.9 % IV SOLN: INTRAVENOUS | Status: DC

## 2020-10-11 MED ORDER — INSULIN ASPART 100 UNIT/ML ~~LOC~~ SOLN
0.0000 [IU] | Freq: Every day | SUBCUTANEOUS | Status: DC
  Administered 2020-10-31: 2 [IU] via SUBCUTANEOUS

## 2020-10-11 MED ORDER — IBUPROFEN 400 MG PO TABS
800.0000 mg | ORAL_TABLET | Freq: Two times a day (BID) | ORAL | Status: DC
  Administered 2020-10-11 – 2020-10-13 (×4): 800 mg via ORAL
  Filled 2020-10-11 (×7): qty 2

## 2020-10-11 MED ORDER — DIPHENHYDRAMINE HCL 25 MG PO CAPS
125.0000 mg | ORAL_CAPSULE | Freq: Every day | ORAL | Status: DC
  Administered 2020-10-11: 125 mg via ORAL
  Filled 2020-10-11: qty 5

## 2020-10-11 MED ORDER — METFORMIN HCL 500 MG PO TABS
500.0000 mg | ORAL_TABLET | Freq: Two times a day (BID) | ORAL | Status: DC
  Administered 2020-10-12: 500 mg via ORAL
  Filled 2020-10-11 (×4): qty 1

## 2020-10-11 MED ORDER — FERROUS SULFATE 325 (65 FE) MG PO TABS
325.0000 mg | ORAL_TABLET | Freq: Every day | ORAL | Status: DC
  Administered 2020-10-11 – 2020-12-27 (×78): 325 mg via ORAL
  Filled 2020-10-11 (×79): qty 1

## 2020-10-11 MED ORDER — ONDANSETRON HCL 4 MG PO TABS: 4.0000 mg | ORAL_TABLET | Freq: Four times a day (QID) | ORAL | Status: DC | PRN

## 2020-10-11 MED ORDER — ONDANSETRON HCL 4 MG/2ML IJ SOLN: 4.0000 mg | Freq: Four times a day (QID) | INTRAMUSCULAR | Status: DC | PRN

## 2020-10-11 MED ORDER — ENOXAPARIN SODIUM 120 MG/0.8ML ~~LOC~~ SOLN
120.0000 mg | SUBCUTANEOUS | Status: DC
  Administered 2020-10-11 – 2020-10-26 (×16): 120 mg via SUBCUTANEOUS
  Filled 2020-10-11 (×16): qty 0.8

## 2020-10-11 MED ORDER — ACETAMINOPHEN 325 MG PO TABS
650.0000 mg | ORAL_TABLET | Freq: Four times a day (QID) | ORAL | Status: DC | PRN
  Administered 2020-10-11 – 2020-12-04 (×27): 650 mg via ORAL
  Filled 2020-10-11 (×27): qty 2

## 2020-10-11 MED ORDER — METOPROLOL TARTRATE 12.5 MG HALF TABLET
12.5000 mg | ORAL_TABLET | Freq: Two times a day (BID) | ORAL | Status: DC
  Administered 2020-10-11 – 2020-12-27 (×155): 12.5 mg via ORAL
  Filled 2020-10-11 (×159): qty 1

## 2020-10-11 MED ORDER — FUROSEMIDE 40 MG PO TABS
40.0000 mg | ORAL_TABLET | Freq: Every day | ORAL | Status: DC
  Administered 2020-10-11 – 2020-10-17 (×7): 40 mg via ORAL
  Filled 2020-10-11 (×8): qty 1

## 2020-10-11 MED ORDER — MORPHINE SULFATE (PF) 2 MG/ML IV SOLN
2.0000 mg | INTRAVENOUS | Status: DC | PRN
  Administered 2020-10-11 – 2020-10-13 (×7): 2 mg via INTRAVENOUS
  Filled 2020-10-11 (×7): qty 1

## 2020-10-11 NOTE — TOC Progression Note (Signed)
Transition of Care [facility]) - Progression Note    Patient Details  Name: [name] MRN: [id number] Date of Birth: [birth year]  Transition [facility]) CM/SW Contact  Lennart Pall, LCSW Phone Number: 10/11/2020, 4:47 PM  Clinical Narrative:    Met pt this morning and reviewed information for TOC assessment prior.  Pt confirms plan for SNF and would like to consider facilities in the [facility] area as well.  Have begun insurance auth (ref # [id number]) and started bed search.  TOC will continue to follow.   Expected Discharge Plan: [place] Barriers to Discharge: Continued Medical Work up  Expected Discharge Plan and Services Expected Discharge Plan: [place] In-house Referral: Clinical Social Work Discharge Planning Services: CM Consult Post Acute Care Choice: [place] Living arrangements for the past 2 months: Apartment                                       Social Determinants of Health (SDOH) Interventions    Readmission Risk Interventions No flowsheet data found.

## 2020-10-11 NOTE — Consult Note (Signed)
[place] Nurse wound consult note Consultation was completed by review of records, images and assistance from the bedside nurse/clinical staff.    Reason for Consult: multiple LE wounds; followed in the [facility] by Dr. Dellia Nims Wound type: full and partial thickness ulcerations related to chronic lymphedema; complicated by continuous insult from urine due to incontinence.  Patient has not been able to change dressings, does not ambulate.    Pressure Injury POA: NA Measurement:see nursing flow sheets  Wound bed:all wounds are 100% pink and clean.  Drainage (amount, consistency, odor) dressings have been on for a while; reported malodorous which is not surprising with the length of time dressings have been present.  Periwound: chronic skin changes related to lymphedema  Dressing procedure/placement/frequency: Silver hydrofiber Kellie Simmering # [phone]) cut if needed to fit into skin folds where there is skin breakdown or ulcerations.  Top with ABD pad and secure with tape. Change daily.    Re consult if needed, will not follow at this time. Thanks  Shanisha Lech [employer], RN,CWOCN, CNS, [place] [phone])

## 2020-10-11 NOTE — Evaluation (Signed)
Physical Therapy Evaluation Patient Details Name: [name] MRN: [id number] DOB: 01-Sep-1965 Today's Date: 10/11/2020   History of Present Illness  Pt admitted from home with multiple LE wounds, and FTT and unable to perform basic self care.  Pt reports she stayed in lift chair getting up only twice a day for BM or getting food.  Pt utilizing Rollator around home but pivoted to it from lift chair and wheeled around in seated position.  Pt states she put pads and diapers in chair so she did not have to get up to urinate.  Pt with hx of DM and lymphadema (has been unable to use her pressure sleeves so lymphadema has significantly worsened since last December).  Clinical Impression  Pt admitted as above and presenting with functional mobility limitations 2* generalized weakness, gross deconditioning, LE pain with attempts to move, and obesity.  Pt currently requiring significant assist of multiple people for basic bed mobility and further assessment deferred 2* pt pain level and for pt/staff safety.  Pt would benefit from follow up rehab at SNF level for appropriate wound treatment and to address gross deconditioning.      Follow Up Recommendations SNF    Equipment Recommendations  None recommended by PT    Recommendations for Other Services OT consult     Precautions / Restrictions Precautions Precautions: Fall Restrictions Weight Bearing Restrictions: No      Mobility  Bed Mobility Overal bed mobility: Needs Assistance             General bed mobility comments: Assist of 6 to move up in bed - limited by pt pain level    Transfers                    Ambulation/Gait                Stairs            Wheelchair Mobility    Modified Rankin (Stroke Patients Only)       Balance                                             Pertinent Vitals/Pain Pain Assessment: Faces Faces Pain Scale: Hurts whole lot Pain Location:  generalized but focused in bilat hips and thighs Pain Descriptors / Indicators: Grimacing;Spasm Pain Intervention(s): Limited activity within patient's tolerance;Monitored during session;Premedicated before session    Home Living Family/patient expects to be discharged to:: Skilled nursing facility Living Arrangements: Alone                    Prior Function Level of Independence: Needs assistance   Gait / Transfers Assistance Needed: Stand pvt only and getting increasingly difficult  ADL's / Homemaking Assistance Needed: I do the best I can        Hand Dominance        Extremity/Trunk Assessment   Upper Extremity Assessment Upper Extremity Assessment: Generalized weakness    Lower Extremity Assessment Lower Extremity Assessment: Generalized weakness;RLE deficits/detail;LLE deficits/detail RLE: Unable to fully assess due to pain LLE: Unable to fully assess due to pain       Communication   Communication: No difficulties  Cognition Arousal/Alertness: Awake/alert Behavior During Therapy: WFL for tasks assessed/performed Overall Cognitive Status: Within Functional Limits for tasks assessed  General Comments      Exercises General Exercises - Lower Extremity Ankle Circles/Pumps: AROM;Both;15 reps;Supine   Assessment/Plan    PT Assessment Patient needs continued PT services  PT Problem List Decreased strength;Decreased range of motion;Decreased activity tolerance;Decreased balance;Decreased mobility;Decreased knowledge of use of DME;Obesity;Pain       PT Treatment Interventions DME instruction;Gait training;Functional mobility training;Therapeutic activities;Therapeutic exercise;Balance training;Patient/family education    PT Goals (Current goals can be found in the Care Plan section)  Acute Rehab PT Goals Patient Stated Goal: Regain health and IND to the point she can function at home PT Goal  Formulation: With patient Time For Goal Achievement: 10/24/20 Potential to Achieve Goals: Fair    Frequency Min 2X/week   Barriers to discharge Decreased caregiver support Pt lives alone    Co-evaluation               AM-PAC PT "6 Clicks" Mobility  Outcome Measure Help needed turning from your back to your side while in a flat bed without using bedrails?: A Lot Help needed moving from lying on your back to sitting on the side of a flat bed without using bedrails?: Total Help needed moving to and from a bed to a chair (including a wheelchair)?: Total Help needed standing up from a chair using your arms (e.g., wheelchair or bedside chair)?: Total Help needed to walk in hospital room?: Total Help needed climbing 3-5 steps with a railing? : Total 6 Click Score: 7    End of Session   Activity Tolerance: Patient limited by pain Patient left: in bed Nurse Communication: Mobility status PT Visit Diagnosis: Muscle weakness (generalized) (M62.81);Difficulty in walking, not elsewhere classified (R26.2);Pain;Adult, failure to thrive (R62.7) Pain - part of body:  (Generalized but focused in hips and thighs this session)    Time: 9563-8756 PT Time Calculation (min) (ACUTE ONLY): 22 min   Charges:   PT Evaluation $PT Eval Low Complexity: 1 Low          [place] [facility] Pager [phone] Office [phone]   Roger Fasnacht 10/11/2020, 1:04 PM

## 2020-10-11 NOTE — Progress Notes (Signed)
PROGRESS NOTE    [name]  [id number] DOB: [birth year] DOA: 10/10/2020 PCP: Fanny Bien, MD   Brief Narrative: 55 year old female morbidly obese type II diabetic with chronic lymphedema chronic nonhealing ulcers on both legs and calves and left upper thigh followed by wound clinic Dr. Asa Saunas was also being followed by home health nurses 3 times a week which has been terminated few weeks ago by insurance her insurance company.  Now her wounds have become much larger with increased drainage and pain.  She is unable to change the dressings by herself due to the location of the wound and morbid obesity.  She does not walk she only moves around in the house in the rolling chair 1-2 times per day.  She was not able to get to the restroom so she has been urinating in the bed which has made the wounds even much worse.  She lives alone.  She describes her pain as 10 out of 10 and has been receiving IV narcotics.  Chronic wounds and multiple areas of skin breakdown.  When patient came to the ER she had very old dressings still in place which were malodorous and had bloody drainage with chronic skin changes in the  periwound area.  She received Ancef in the ER.  Assessment & Plan:   Principal Problem:   Wound infection Active Problems:   Hyperkalemia   Chronic peripheral venous hypertension with lower extremity complication   Lymphedema   Obesity   Type 2 diabetes mellitus (HCC)  #1 chronic bilateral lower extremity wounds now with increased pain foul-smelling discharge.  Appreciate wound care input.  Patient's wounds are 100% pink and clean.  Will DC vancomycin and cefepime .  #2 type 2 diabetes-check hemoglobin A1c.  She is not on any medications at home.  We will cover her with SSI for now. CBG (last 3)  Recent Labs    10/11/20 0019 10/11/20 0750  GLUCAP 177* 148*     #3 chronic lymphedema she has not been using the lymphedema pump at home.  #4 morbid obesity BMI  73.89.  #5 essential hypertension blood pressure 177/98.  She does not take any medications at home.  We will start her on metoprolol 12.5 twice daily.  #6 hyperkalemia resolved with Lokelma  #7 tachycardia secondary to acute pain.  #8 disposition patient lives alone and is unable to care for herself.  She is unable to change the dressings on the wound because of location and morbid obesity and pain.  She does not ambulate.  She moves around in her Rollator couple of times during the day.  She is unable to cook for herself as she is unable to stand.  She will need placement.  She is an unsafe discharge back home.  She does not have any family members that can assist her.   Pressure Injury 10/11/20 Thigh Left;Posterior (Active)  10/11/20 0041  Location: Thigh  Location Orientation: Left;Posterior  Staging:   Wound Description (Comments):   Present on Admission:      Pressure Injury 10/11/20 Tibial Left;Posterior;Proximal (Active)  10/11/20 0041  Location: Tibial  Location Orientation: Left;Posterior;Proximal  Staging:   Wound Description (Comments):   Present on Admission:      Estimated body mass index is 73.89 kg/m as calculated from the following:   Height as of this encounter: 5\' 10"  (1.778 m).   Weight as of this encounter: 233.6 kg.  DVT prophylaxis: Lovenox  code Status: Full code Family Communication: None  at bedside  disposition Plan:  Status is: Observation   Dispo: The patient is from: Home              Anticipated d/c is to: SNF              Patient currently is medically stable to d/c.   Difficult to place patient unknown    Consultants: None  Procedures: None Antimicrobials: None  Subjective: She is resting in bed complaining of pain  Objective: Vitals:   10/10/20 2330 10/11/20 0026 10/11/20 0537 10/11/20 1016  BP: (!) 146/82 (!) 163/91 120/79 (!) 177/98  Pulse: 94 84 (!) 110 (!) 109  Resp: 16 19 16 20   Temp:  (!) 97.3 F (36.3 C) 98.3 F  (36.8 C) 98.7 F (37.1 C)  TempSrc:  Oral Oral Oral  SpO2: 96% 97% 91% 94%  Weight:      Height:        Intake/Output Summary (Last 24 hours) at 10/11/2020 1056 Last data filed at 10/11/2020 9678 Gross per 24 hour  Intake 1532.12 ml  Output 1100 ml  Net 432.12 ml   Filed Weights   10/10/20 1954  Weight: (!) 233.6 kg    Examination:  General exam: Appears calm and comfortable  Respiratory system: Clear to auscultation. Respiratory effort normal. Cardiovascular system: S1 & S2 heard, RRR. No JVD, murmurs, rubs, gallops or clicks. No pedal edema. Gastrointestinal system: Abdomen is nondistended, soft and nontender. No organomegaly or masses felt. Normal bowel sounds heard. Central nervous system: Alert and oriented. No focal neurological deficits. Extremities: 2+ edema Skin: Multiple wounds in bilateral lower extremities Psychiatry: Judgement and insight appear normal. Mood & affect appropriate.     Data Reviewed: I have personally reviewed following labs and imaging studies  CBC: Recent Labs  Lab 10/10/20 1610 10/11/20 0101 10/11/20 0306  WBC 7.8 7.8 7.9  NEUTROABS 5.3  --   --   HGB 12.8 11.9* 11.6*  HCT 45.4 40.2 40.3  MCV 102.7* 99.8 102.0*  PLT 216 246 938   Basic Metabolic Panel: Recent Labs  Lab 10/10/20 1556 10/11/20 0101 10/11/20 0306  NA 138  --  141  K 5.6*  --  4.0  CL 108  --  109  CO2 20*  --  21*  GLUCOSE 135*  --  152*  BUN 16  --  15  CREATININE 0.88 0.80 0.79  CALCIUM 8.6*  --  8.6*   GFR: Estimated Creatinine Clearance: 174.7 mL/min (by C-G formula based on SCr of 0.79 mg/dL). Liver Function Tests: Recent Labs  Lab 10/11/20 0306  AST 17  ALT 12  ALKPHOS 56  BILITOT 0.8  PROT 7.0  ALBUMIN 3.2*   No results for input(s): LIPASE, AMYLASE in the last 168 hours. No results for input(s): AMMONIA in the last 168 hours. Coagulation Profile: No results for input(s): INR, PROTIME in the last 168 hours. Cardiac Enzymes: No results  for input(s): CKTOTAL, CKMB, CKMBINDEX, TROPONINI in the last 168 hours. BNP (last 3 results) No results for input(s): PROBNP in the last 8760 hours. HbA1C: Recent Labs    10/10/20 1610  HGBA1C 6.5*   CBG: Recent Labs  Lab 10/11/20 0019 10/11/20 0750  GLUCAP 177* 148*   Lipid Profile: No results for input(s): CHOL, HDL, LDLCALC, TRIG, CHOLHDL, LDLDIRECT in the last 72 hours. Thyroid Function Tests: No results for input(s): TSH, T4TOTAL, FREET4, T3FREE, THYROIDAB in the last 72 hours. Anemia Panel: No results for input(s): VITAMINB12, FOLATE, FERRITIN,  TIBC, IRON, RETICCTPCT in the last 72 hours. Sepsis Labs: No results for input(s): PROCALCITON, LATICACIDVEN in the last 168 hours.  Recent Results (from the past 240 hour(s))  SARS CORONAVIRUS 2 (TAT 6-24 HRS) Nasopharyngeal Nasopharyngeal Swab     Status: None   Collection Time: 10/10/20  5:00 PM   Specimen: Nasopharyngeal Swab  Result Value Ref Range Status   SARS Coronavirus 2 NEGATIVE NEGATIVE Final    Comment: (NOTE) SARS-CoV-2 target nucleic acids are NOT DETECTED.  The SARS-CoV-2 RNA is generally detectable in upper and lower respiratory specimens during the acute phase of infection. Negative results do not preclude SARS-CoV-2 infection, do not rule out co-infections with other pathogens, and should not be used as the sole basis for treatment or other patient management decisions. Negative results must be combined with clinical observations, patient history, and epidemiological information. The expected result is Negative.  Fact Sheet for Patients: SugarRoll.be  Fact Sheet for Healthcare Providers: https://www.[facility] [place].com/  This test is not yet approved or cleared by the [place] FDA and  has been authorized for detection and/or diagnosis of SARS-CoV-2 by FDA under an Emergency Use Authorization (EUA). This EUA will remain  in effect (meaning this test can be  used) for the duration of the COVID-19 declaration under Se ction 564(b)(1) of the Act, 21 U.S.C. section 360bbb-3(b)(1), unless the authorization is terminated or revoked sooner.  Performed at [facility], [place] [address], [place], [place] [address]          Radiology Studies: No results found.      Scheduled Meds: . diphenhydrAMINE  125 mg Oral QHS   And  . acetaminophen  1,000 mg Oral QHS  . enoxaparin (LOVENOX) injection  120 mg Subcutaneous Q24H  . ferrous sulfate  325 mg Oral Daily  . ibuprofen  800 mg Oral BID WC  . insulin aspart  0-20 Units Subcutaneous TID WC  . insulin aspart  0-5 Units Subcutaneous QHS   Continuous Infusions: . sodium chloride 100 mL/hr at 10/11/20 0138  . ceFEPime (MAXIPIME) IV 2 g (10/11/20 0812)  . vancomycin       LOS: 1 day   Georgette Shell, MD [facility]  10/11/2020, 10:56 AM

## 2020-10-11 NOTE — Care Management Obs Status (Signed)
[place] NOTIFICATION   Patient Details  Name: [name] MRN: [id number] Date of Birth: 1966-01-11   Medicare Observation Status Notification Given:  Macario Golds, LCSW 10/11/2020, 12:24 PM

## 2020-10-11 NOTE — Progress Notes (Signed)
Pt alert and aware in bed. Pt's aunt was present at bedside. The pt gave a bit of her history and that she is episcopalian.  The chaplain explained the AD paperwork. We were able to complete the AD. Prayers and blessings were also offered.

## 2020-10-11 NOTE — Care Management CC44 (Signed)
Condition Code 44 Documentation Completed  Patient Details  Name: [name] MRN: [id number] Date of Birth: [birth year]   Condition Code 44 given:  Yes Patient signature on Condition Code 44 notice:  Yes Documentation of 2 MD's agreement:  Yes Code 44 added to claim:  Yes    Ivy Puryear, LCSW 10/11/2020, 12:24 PM

## 2020-10-12 DIAGNOSIS — T148XXA Other injury of unspecified body region, initial encounter: Secondary | ICD-10-CM | POA: Diagnosis not present

## 2020-10-12 DIAGNOSIS — L089 Local infection of the skin and subcutaneous tissue, unspecified: Secondary | ICD-10-CM | POA: Diagnosis not present

## 2020-10-12 LAB — GLUCOSE, CAPILLARY
Glucose-Capillary: 162 mg/dL — ABNORMAL HIGH (ref 70–99)
Glucose-Capillary: 161 mg/dL — ABNORMAL HIGH (ref 70–99)
Glucose-Capillary: 154 mg/dL — ABNORMAL HIGH (ref 70–99)
Glucose-Capillary: 148 mg/dL — ABNORMAL HIGH (ref 70–99)

## 2020-10-12 LAB — BASIC METABOLIC PANEL
Anion gap: 10 (ref 5–15)
BUN: 15 mg/dL (ref 6–20)
CO2: 25 mmol/L (ref 22–32)
Calcium: 8.7 mg/dL — ABNORMAL LOW (ref 8.9–10.3)
Chloride: 105 mmol/L (ref 98–111)
Creatinine, Ser: 0.87 mg/dL (ref 0.44–1.00)
GFR, Estimated: >60 mL/min (ref >60)
Glucose, Bld: 136 mg/dL — ABNORMAL HIGH (ref 70–99)
Potassium: 4 mmol/L (ref 3.5–5.1)
Sodium: 140 mmol/L (ref 135–145)

## 2020-10-12 MED ORDER — HYDROCORTISONE 1 % EX LOTN
TOPICAL_LOTION | Freq: Two times a day (BID) | CUTANEOUS | Status: DC
  Administered 2020-10-12 – 2020-10-16 (×4): 1 via TOPICAL
  Filled 2020-10-12 (×3): qty 118

## 2020-10-12 MED ORDER — HYDROXYZINE HCL 10 MG PO TABS
10.0000 mg | ORAL_TABLET | Freq: Three times a day (TID) | ORAL | Status: DC
  Administered 2020-10-12 – 2020-12-27 (×227): 10 mg via ORAL
  Filled 2020-10-12 (×234): qty 1

## 2020-10-12 NOTE — TOC Progression Note (Addendum)
Transition of Care [facility]) - Progression Note    Patient Details  Name: ATIYAH BAUER MRN: [id number] Date of Birth: [birth year]  [facility]) CM/SW Contact  Ross Ludwig, [place] Phone Number: 10/12/2020, 5:16 PM  Clinical Narrative:     Insurance authorization still pending, and patient still awaiting for bed offers.   Expected Discharge Plan: [place] Barriers to Discharge: Continued Medical Work up  Expected Discharge Plan and Services Expected Discharge Plan: [place] In-house Referral: Clinical Social Work Discharge Planning Services: CM Consult Post Acute Care Choice: [place] Living arrangements for the past 2 months: Apartment                                       Social Determinants of Health (SDOH) Interventions    Readmission Risk Interventions No flowsheet data found.

## 2020-10-12 NOTE — Progress Notes (Signed)
Pt refused to be turned throughout the night. Educated patient. Pt more cooperative this morning, pre-medicated before turning and dressing changes. Dressing changes completed at 0540.

## 2020-10-12 NOTE — Progress Notes (Signed)
PROGRESS NOTE    [name]  UYQ:[id number] DOB: 1965-11-04 DOA: 10/10/2020 PCP: Fanny Bien, MD   Brief Narrative: 55 year old female morbidly obese type II diabetic with chronic lymphedema chronic nonhealing ulcers on both legs and calves and left upper thigh followed by wound clinic Dr. Asa Saunas was also being followed by home health nurses 3 times a week which has been terminated few weeks ago by insurance her insurance company.  Now her wounds have become much larger with increased drainage and pain.  She is unable to change the dressings by herself due to the location of the wound and morbid obesity.  She does not walk she only moves around in the house in the rolling chair 1-2 times per day.  She was not able to get to the restroom so she has been urinating in the bed which has made the wounds even much worse.  She lives alone.  She describes her pain as 10 out of 10 and has been receiving IV narcotics.  Chronic wounds and multiple areas of skin breakdown.  When patient came to the ER she had very old dressings still in place which were malodorous and had bloody drainage with chronic skin changes in the  periwound area.  She received Ancef in the ER.  Assessment & Plan:   Principal Problem:   Wound infection Active Problems:   Hyperkalemia   Chronic peripheral venous hypertension with lower extremity complication   Lymphedema   Obesity   Type 2 diabetes mellitus (HCC)   HTN (hypertension)  #1 chronic bilateral lower extremity wounds now with increased pain foul-smelling discharge.  Appreciate wound care input.  Patient's wounds are 100% pink and clean.DCd vancomycin and cefepime .  #2 type 2 diabetes-check hemoglobin A1c.  She is not on any medications at home.  We will cover her with SSI for now. CBG (last 3)  Recent Labs    10/11/20 1703 10/11/20 2127 10/12/20 0817  GLUCAP 149* 150* 162*     #3 chronic lymphedema she has not been using the lymphedema pump at  home.  #4 morbid obesity BMI 73.89.  #5 essential hypertension blood pressure 177/98.  She does not take any medications at home.  We will start her on metoprolol 12.5 twice daily.  #6 hyperkalemia resolved with Lokelma  #7 tachycardia secondary to acute pain.  #8 disposition patient lives alone and is unable to care for herself.  She is unable to change the dressings on the wound because of location and morbid obesity and pain.  She does not ambulate.  She moves around in her Rollator couple of times during the day.  She is unable to cook for herself as she is unable to stand.  She will need placement.  She is an unsafe discharge back home.  She does not have any family members that can assist her.  #9 itching I will start her on hydrocortisone cream and Atarax.  Hold off on starting prednisone.   Pressure Injury 10/11/20 Thigh Left;Posterior (Active)  10/11/20 0041  Location: Thigh  Location Orientation: Left;Posterior  Staging:   Wound Description (Comments):   Present on Admission:      Pressure Injury 10/11/20 Tibial Left;Posterior;Proximal (Active)  10/11/20 0041  Location: Tibial  Location Orientation: Left;Posterior;Proximal  Staging:   Wound Description (Comments):   Present on Admission:      Estimated body mass index is 73.89 kg/m as calculated from the following:   Height as of this encounter: 5\' 10"  (1.778  m).   Weight as of this encounter: 233.6 kg.  DVT prophylaxis: Lovenox  code Status: Full code Family Communication: None at bedside  disposition Plan:  Status is: Observation   Dispo: The patient is from: Home              Anticipated d/c is to: SNF              Patient currently is medically stable to d/c.   Difficult to place patient unknown    Consultants: None  Procedures: None Antimicrobials: None  Subjective: She is resting in bed complaining of pain and itching itching all through the back she has history of eczema was followed by  dermatology in the past who gave her hydrocortisone cream and prednisone  Objective: Vitals:   10/11/20 1016 10/11/20 1450 10/11/20 2127 10/12/20 0456  BP: (!) 177/98 (!) 175/90 (!) 159/81 (!) 157/92  Pulse: (!) 109 95 100 99  Resp: 20 20 20 20   Temp: 98.7 F (37.1 C) 98.4 F (36.9 C) 99.2 F (37.3 C) 98.2 F (36.8 C)  TempSrc: Oral Oral Oral Oral  SpO2: 94% 94% 93% 91%  Weight:      Height:        Intake/Output Summary (Last 24 hours) at 10/12/2020 1001 Last data filed at 10/12/2020 0456 Gross per 24 hour  Intake 1831.92 ml  Output 1075 ml  Net 756.92 ml   Filed Weights   10/10/20 1954  Weight: (!) 233.6 kg    Examination:  General exam: Appears calm and comfortable  Respiratory system: Clear to auscultation. Respiratory effort normal. Cardiovascular system: S1 & S2 heard, RRR. No JVD, murmurs, rubs, gallops or clicks. No pedal edema. Gastrointestinal system: Abdomen is nondistended, soft and nontender. No organomegaly or masses felt. Normal bowel sounds heard. Central nervous system: Alert and oriented. No focal neurological deficits. Extremities: 2+ edema Skin: Multiple wounds in bilateral lower extremities Psychiatry: Judgement and insight appear normal. Mood & affect appropriate.     Data Reviewed: I have personally reviewed following labs and imaging studies  CBC: Recent Labs  Lab 10/10/20 1610 10/11/20 0101 10/11/20 0306  WBC 7.8 7.8 7.9  NEUTROABS 5.3  --   --   HGB 12.8 11.9* 11.6*  HCT 45.4 40.2 40.3  MCV 102.7* 99.8 102.0*  PLT 216 246 924   Basic Metabolic Panel: Recent Labs  Lab 10/10/20 1556 10/11/20 0101 10/11/20 0306 10/12/20 0225  NA 138  --  141 140  K 5.6*  --  4.0 4.0  CL 108  --  109 105  CO2 20*  --  21* 25  GLUCOSE 135*  --  152* 136*  BUN 16  --  15 15  CREATININE 0.88 0.80 0.79 0.87  CALCIUM 8.6*  --  8.6* 8.7*   GFR: Estimated Creatinine Clearance: 160.6 mL/min (by C-G formula based on SCr of 0.87 mg/dL). Liver  Function Tests: Recent Labs  Lab 10/11/20 0306  AST 17  ALT 12  ALKPHOS 56  BILITOT 0.8  PROT 7.0  ALBUMIN 3.2*   No results for input(s): LIPASE, AMYLASE in the last 168 hours. No results for input(s): AMMONIA in the last 168 hours. Coagulation Profile: No results for input(s): INR, PROTIME in the last 168 hours. Cardiac Enzymes: No results for input(s): CKTOTAL, CKMB, CKMBINDEX, TROPONINI in the last 168 hours. BNP (last 3 results) No results for input(s): PROBNP in the last 8760 hours. HbA1C: Recent Labs    10/10/20 1610  HGBA1C 6.5*  CBG: Recent Labs  Lab 10/11/20 0750 10/11/20 1213 10/11/20 1703 10/11/20 2127 10/12/20 0817  GLUCAP 148* 180* 149* 150* 162*   Lipid Profile: No results for input(s): CHOL, HDL, LDLCALC, TRIG, CHOLHDL, LDLDIRECT in the last 72 hours. Thyroid Function Tests: No results for input(s): TSH, T4TOTAL, FREET4, T3FREE, THYROIDAB in the last 72 hours. Anemia Panel: No results for input(s): VITAMINB12, FOLATE, FERRITIN, TIBC, IRON, RETICCTPCT in the last 72 hours. Sepsis Labs: No results for input(s): PROCALCITON, LATICACIDVEN in the last 168 hours.  Recent Results (from the past 240 hour(s))  SARS CORONAVIRUS 2 (TAT 6-24 HRS) Nasopharyngeal Nasopharyngeal Swab     Status: None   Collection Time: 10/10/20  5:00 PM   Specimen: Nasopharyngeal Swab  Result Value Ref Range Status   SARS Coronavirus 2 NEGATIVE NEGATIVE Final    Comment: (NOTE) SARS-CoV-2 target nucleic acids are NOT DETECTED.  The SARS-CoV-2 RNA is generally detectable in upper and lower respiratory specimens during the acute phase of infection. Negative results do not preclude SARS-CoV-2 infection, do not rule out co-infections with other pathogens, and should not be used as the sole basis for treatment or other patient management decisions. Negative results must be combined with clinical observations, patient history, and epidemiological information. The  expected result is Negative.  Fact Sheet for Patients: SugarRoll.be  Fact Sheet for Healthcare Providers: https://www.[employer].com/  This test is not yet approved or cleared by the [place] FDA and  has been authorized for detection and/or diagnosis of SARS-CoV-2 by FDA under an Emergency Use Authorization (EUA). This EUA will remain  in effect (meaning this test can be used) for the duration of the COVID-19 declaration under Se ction 564(b)(1) of the Act, 21 U.S.C. section 360bbb-3(b)(1), unless the authorization is terminated or revoked sooner.  Performed at [facility], [place] [address], [place], [place] [address]          Radiology Studies: No results found.      Scheduled Meds: . acetaminophen  1,000 mg Oral QHS  . enoxaparin (LOVENOX) injection  120 mg Subcutaneous Q24H  . ferrous sulfate  325 mg Oral Daily  . furosemide  40 mg Oral Daily  . ibuprofen  800 mg Oral BID WC  . insulin aspart  0-20 Units Subcutaneous TID WC  . insulin aspart  0-5 Units Subcutaneous QHS  . metFORMIN  500 mg Oral BID WC  . metoprolol tartrate  12.5 mg Oral BID   Continuous Infusions:    LOS: 1 day   Georgette Shell, MD [facility]  10/12/2020, 10:01 AM

## 2020-10-13 DIAGNOSIS — T148XXA Other injury of unspecified body region, initial encounter: Secondary | ICD-10-CM | POA: Diagnosis not present

## 2020-10-13 DIAGNOSIS — L089 Local infection of the skin and subcutaneous tissue, unspecified: Secondary | ICD-10-CM | POA: Diagnosis not present

## 2020-10-13 LAB — GLUCOSE, CAPILLARY
Glucose-Capillary: 159 mg/dL — ABNORMAL HIGH (ref 70–99)
Glucose-Capillary: 174 mg/dL — ABNORMAL HIGH (ref 70–99)
Glucose-Capillary: 161 mg/dL — ABNORMAL HIGH (ref 70–99)
Glucose-Capillary: 147 mg/dL — ABNORMAL HIGH (ref 70–99)

## 2020-10-13 LAB — URINALYSIS, ROUTINE W REFLEX MICROSCOPIC
Bilirubin Urine: NEGATIVE
Glucose, UA: NEGATIVE mg/dL
Ketones, ur: NEGATIVE mg/dL
Nitrite: NEGATIVE
Protein, ur: NEGATIVE mg/dL
Specific Gravity, Urine: 1.009 (ref 1.005–1.030)
pH: 5 (ref 5.0–8.0)

## 2020-10-13 NOTE — Plan of Care (Signed)
  Problem: Education: Goal: Knowledge of General Education information will improve Description Including pain rating scale, medication(s)/side effects and non-pharmacologic comfort measures Outcome: Progressing   

## 2020-10-13 NOTE — Progress Notes (Signed)
PROGRESS NOTE    VALLEY KE  DPO:[id number] DOB: [birth year] DOA: 10/10/2020 PCP: Fanny Bien, MD   Brief Narrative: 55 year old female morbidly obese type II diabetic with chronic lymphedema chronic nonhealing ulcers on both legs and calves and left upper thigh followed by wound clinic Dr. Asa Saunas was also being followed by home health nurses 3 times a week which has been terminated few weeks ago by insurance her insurance company.  Now her wounds have become much larger with increased drainage and pain.  She is unable to change the dressings by herself due to the location of the wound and morbid obesity.  She does not walk she only moves around in the house in the rolling chair 1-2 times per day.  She was not able to get to the restroom so she has been urinating in the bed which has made the wounds even much worse.  She lives alone.  She describes her pain as 10 out of 10 and has been receiving IV narcotics.  Chronic wounds and multiple areas of skin breakdown.  When patient came to the ER she had very old dressings still in place which were malodorous and had bloody drainage with chronic skin changes in the  periwound area.  She received Ancef in the ER.  Assessment & Plan:   Principal Problem:   Wound infection Active Problems:   Hyperkalemia   Chronic peripheral venous hypertension with lower extremity complication   Lymphedema   Obesity   Type 2 diabetes mellitus (HCC)   HTN (hypertension)  #1 chronic bilateral lower extremity wounds now with increased pain foul-smelling discharge.  Appreciate wound care input.  Patient's wounds are 100% pink and clean.DCd vancomycin and cefepime .  #2 type 2 diabetes-check hemoglobin A1c.  She is not on any medications at home.  We will cover her with SSI for now. CBG (last 3)  Recent Labs    10/12/20 1644 10/12/20 2223 10/13/20 0734  GLUCAP 154* 148* 159*     #3 chronic lymphedema she has not been using the lymphedema pump at  home.  #4 morbid obesity BMI 73.89.  #5 essential hypertension-blood pressure improved to 155/72 after starting her on metoprolol 12.5 twice daily along with Lasix.  Continue the same.    #6 hyperkalemia resolved with Lokelma  #7 tachycardia secondary to acute pain.  #8 disposition patient lives alone and is unable to care for herself.  She is unable to change the dressings on the wound because of location and morbid obesity and pain.  She does not ambulate.  She moves around in her Rollator couple of times during the day.  She is unable to cook for herself as she is unable to stand.  She will need placement.  She is an unsafe discharge back home.  She does not have any family members that can assist her.  #9 itching continue Atarax and hydrocortisone cream.    Pressure Injury 10/11/20 Thigh Left;Posterior (Active)  10/11/20 0041  Location: Thigh  Location Orientation: Left;Posterior  Staging:   Wound Description (Comments):   Present on Admission:      Pressure Injury 10/11/20 Tibial Left;Posterior;Proximal (Active)  10/11/20 0041  Location: Tibial  Location Orientation: Left;Posterior;Proximal  Staging:   Wound Description (Comments):   Present on Admission:      Estimated body mass index is 73.89 kg/m as calculated from the following:   Height as of this encounter: 5\' 10"  (1.778 m).   Weight as of this encounter: 233.6 kg.  DVT prophylaxis: Lovenox  code Status: Full code Family Communication: None at bedside  disposition Plan:  Status is: Observation   Dispo: The patient is from: Home              Anticipated d/c is to: SNF              Patient currently is medically stable to d/c.   Difficult to place patient unknown    Consultants: None  Procedures: None Antimicrobials: None  Subjective: Patient resting in bed staff reported that she refusing to take metformin. Objective: Vitals:   10/12/20 0456 10/12/20 1326 10/12/20 2221 10/13/20 0646  BP: (!)  157/92 (!) 148/79 (!) 141/69 (!) 155/72  Pulse: 99 100 (!) 105 (!) 108  Resp: 20 (!) 22 16 18   Temp: 98.2 F (36.8 C) 98.7 F (37.1 C) 98.6 F (37 C) 98.9 F (37.2 C)  TempSrc: Oral Oral Oral Oral  SpO2: 91% (!) 86% 93% 93%  Weight:      Height:        Intake/Output Summary (Last 24 hours) at 10/13/2020 1218 Last data filed at 10/13/2020 0615 Gross per 24 hour  Intake 1920 ml  Output 1700 ml  Net 220 ml   Filed Weights   10/10/20 1954  Weight: (!) 233.6 kg    Examination:  General exam: Appears calm and comfortable  Respiratory system: Clear to auscultation. Respiratory effort normal. Cardiovascular system: S1 & S2 heard, RRR. No JVD, murmurs, rubs, gallops or clicks. No pedal edema. Gastrointestinal system: Abdomen is nondistended, soft and nontender. No organomegaly or masses felt. Normal bowel sounds heard. Central nervous system: Alert and oriented. No focal neurological deficits. Extremities: 2+ edema Skin: Multiple wounds in bilateral lower extremities Psychiatry: Judgement and insight appear normal. Mood & affect appropriate.     Data Reviewed: I have personally reviewed following labs and imaging studies  CBC: Recent Labs  Lab 10/10/20 1610 10/11/20 0101 10/11/20 0306  WBC 7.8 7.8 7.9  NEUTROABS 5.3  --   --   HGB 12.8 11.9* 11.6*  HCT 45.4 40.2 40.3  MCV 102.7* 99.8 102.0*  PLT 216 246 101   Basic Metabolic Panel: Recent Labs  Lab 10/10/20 1556 10/11/20 0101 10/11/20 0306 10/12/20 0225  NA 138  --  141 140  K 5.6*  --  4.0 4.0  CL 108  --  109 105  CO2 20*  --  21* 25  GLUCOSE 135*  --  152* 136*  BUN 16  --  15 15  CREATININE 0.88 0.80 0.79 0.87  CALCIUM 8.6*  --  8.6* 8.7*   GFR: Estimated Creatinine Clearance: 160.6 mL/min (by C-G formula based on SCr of 0.87 mg/dL). Liver Function Tests: Recent Labs  Lab 10/11/20 0306  AST 17  ALT 12  ALKPHOS 56  BILITOT 0.8  PROT 7.0  ALBUMIN 3.2*   No results for input(s): LIPASE, AMYLASE  in the last 168 hours. No results for input(s): AMMONIA in the last 168 hours. Coagulation Profile: No results for input(s): INR, PROTIME in the last 168 hours. Cardiac Enzymes: No results for input(s): CKTOTAL, CKMB, CKMBINDEX, TROPONINI in the last 168 hours. BNP (last 3 results) No results for input(s): PROBNP in the last 8760 hours. HbA1C: Recent Labs    10/10/20 1610  HGBA1C 6.5*   CBG: Recent Labs  Lab 10/12/20 0817 10/12/20 1127 10/12/20 1644 10/12/20 2223 10/13/20 0734  GLUCAP 162* 161* 154* 148* 159*   Lipid Profile: No results for  input(s): CHOL, HDL, LDLCALC, TRIG, CHOLHDL, LDLDIRECT in the last 72 hours. Thyroid Function Tests: No results for input(s): TSH, T4TOTAL, FREET4, T3FREE, THYROIDAB in the last 72 hours. Anemia Panel: No results for input(s): VITAMINB12, FOLATE, FERRITIN, TIBC, IRON, RETICCTPCT in the last 72 hours. Sepsis Labs: No results for input(s): PROCALCITON, LATICACIDVEN in the last 168 hours.  Recent Results (from the past 240 hour(s))  SARS CORONAVIRUS 2 (TAT 6-24 HRS) Nasopharyngeal Nasopharyngeal Swab     Status: None   Collection Time: 10/10/20  5:00 PM   Specimen: Nasopharyngeal Swab  Result Value Ref Range Status   SARS Coronavirus 2 NEGATIVE NEGATIVE Final    Comment: (NOTE) SARS-CoV-2 target nucleic acids are NOT DETECTED.  The SARS-CoV-2 RNA is generally detectable in upper and lower respiratory specimens during the acute phase of infection. Negative results do not preclude SARS-CoV-2 infection, do not rule out co-infections with other pathogens, and should not be used as the sole basis for treatment or other patient management decisions. Negative results must be combined with clinical observations, patient history, and epidemiological information. The expected result is Negative.  Fact Sheet for Patients: SugarRoll.be  Fact Sheet for Healthcare  Providers: https://www.[employer] [employer].com/  This test is not yet approved or cleared by the [place] FDA and  has been authorized for detection and/or diagnosis of SARS-CoV-2 by FDA under an Emergency Use Authorization (EUA). This EUA will remain  in effect (meaning this test can be used) for the duration of the COVID-19 declaration under Se ction 564(b)(1) of the Act, 21 U.S.C. section 360bbb-3(b)(1), unless the authorization is terminated or revoked sooner.  Performed at [facility], [place] [address] [place], [place] [address]          Radiology Studies: No results found.      Scheduled Meds: . acetaminophen  1,000 mg Oral QHS  . enoxaparin (LOVENOX) injection  120 mg Subcutaneous Q24H  . ferrous sulfate  325 mg Oral Daily  . furosemide  40 mg Oral Daily  . hydrocortisone   Topical BID  . hydrOXYzine  10 mg Oral TID  . ibuprofen  800 mg Oral BID WC  . insulin aspart  0-20 Units Subcutaneous TID WC  . insulin aspart  0-5 Units Subcutaneous QHS  . metFORMIN  500 mg Oral BID WC  . metoprolol tartrate  12.5 mg Oral BID   Continuous Infusions:    LOS: 1 day   Georgette Shell, MD [facility]  10/13/2020, 12:18 PM

## 2020-10-14 DIAGNOSIS — T148XXA Other injury of unspecified body region, initial encounter: Secondary | ICD-10-CM | POA: Diagnosis not present

## 2020-10-14 DIAGNOSIS — L089 Local infection of the skin and subcutaneous tissue, unspecified: Secondary | ICD-10-CM | POA: Diagnosis not present

## 2020-10-14 LAB — BASIC METABOLIC PANEL
Anion gap: 10 (ref 5–15)
BUN: 15 mg/dL (ref 6–20)
CO2: 28 mmol/L (ref 22–32)
Calcium: 8.9 mg/dL (ref 8.9–10.3)
Chloride: 103 mmol/L (ref 98–111)
Creatinine, Ser: 0.72 mg/dL (ref 0.44–1.00)
GFR, Estimated: >60 mL/min (ref >60)
Glucose, Bld: 165 mg/dL — ABNORMAL HIGH (ref 70–99)
Potassium: 4.3 mmol/L (ref 3.5–5.1)
Sodium: 141 mmol/L (ref 135–145)

## 2020-10-14 LAB — GLUCOSE, CAPILLARY
Glucose-Capillary: 184 mg/dL — ABNORMAL HIGH (ref 70–99)
Glucose-Capillary: 179 mg/dL — ABNORMAL HIGH (ref 70–99)
Glucose-Capillary: 169 mg/dL — ABNORMAL HIGH (ref 70–99)
Glucose-Capillary: 136 mg/dL — ABNORMAL HIGH (ref 70–99)

## 2020-10-14 MED ORDER — POTASSIUM CHLORIDE CRYS ER 20 MEQ PO TBCR
20.0000 meq | EXTENDED_RELEASE_TABLET | Freq: Every day | ORAL | Status: DC
  Administered 2020-10-14 – 2020-10-31 (×18): 20 meq via ORAL
  Filled 2020-10-14 (×18): qty 1

## 2020-10-14 NOTE — Progress Notes (Signed)
Physical Therapy Treatment Patient Details Name: [name] MRN: [id number] DOB: 05-02-1966 Today's Date: 10/14/2020    History of Present Illness Pt admitted from home with multiple LE wounds, and FTT and unable to perform basic self care.  Pt reports she stayed in lift chair getting up only twice a day for BM or getting food.  Pt utilizing Rollator around home but pivoted to it from lift chair and wheeled around in seated position.  Pt states she put pads and diapers in chair so she did not have to get up to urinate.  Pt with hx of DM and lymphadema (has been unable to use her pressure sleeves so lymphadema has significantly worsened since last December).    PT Comments    Assisted nursing staff with room change as bed had to be moved manually; lengthy discussion with pt regarding her goals, importance of continuing  to mobilize.  Educated on LE exercise as well assisting with rolling as much as able for core strengthening  Follow Up Recommendations  SNF     Equipment Recommendations  None recommended by PT    Recommendations for Other Services       Precautions / Restrictions Precautions Precautions: Fall Restrictions Weight Bearing Restrictions: No    Mobility  Bed Mobility                    Transfers                    Ambulation/Gait                 Stairs             Wheelchair Mobility    Modified Rankin (Stroke Patients Only)       Balance                                            Cognition Arousal/Alertness: Awake/alert Behavior During Therapy: WFL for tasks assessed/performed Overall Cognitive Status: Within Functional Limits for tasks assessed                                 General Comments: went on disability in 2019      Exercises General Exercises - Lower Extremity Ankle Circles/Pumps: AROM;Both;10 reps Quad Sets: AROM;Both;5 reps;Limitations Quad Sets Limitations:  spasms Heel Slides: AAROM;Both;5 reps;Limitations Heel Slides Limitations: pain, body habitus    General Comments        Pertinent Vitals/Pain Pain Location: generalized but focused in bilat hips and thighs, knees pain increases with movement Pain Descriptors / Indicators: Grimacing;Spasm Pain Intervention(s): Monitored during session    Home Living                      Prior Function            PT Goals (current goals can now be found in the care plan section) Acute Rehab PT Goals Patient Stated Goal: Regain health and IND to the point she can function at home, lose wt, be able to walk PT Goal Formulation: With patient Time For Goal Achievement: 10/24/20 Potential to Achieve Goals: Fair Progress towards PT goals: Progressing toward goals    Frequency    Min 2X/week      PT Plan Current plan remains appropriate  Co-evaluation              AM-PAC PT "6 Clicks" Mobility   Outcome Measure  Help needed turning from your back to your side while in a flat bed without using bedrails?: A Lot Help needed moving from lying on your back to sitting on the side of a flat bed without using bedrails?: Total Help needed moving to and from a bed to a chair (including a wheelchair)?: Total Help needed standing up from a chair using your arms (e.g., wheelchair or bedside chair)?: Total Help needed to walk in hospital room?: Total Help needed climbing 3-5 steps with a railing? : Total 6 Click Score: 7    End of Session   Activity Tolerance: Patient limited by pain Patient left: in bed;with call bell/phone within reach Nurse Communication: Mobility status;Need for lift equipment PT Visit Diagnosis: Muscle weakness (generalized) (M62.81);Difficulty in walking, not elsewhere classified (R26.2);Pain;Adult, failure to thrive (R62.7)     Time: 1155-1226 PT Time Calculation (min) (ACUTE ONLY): 31 min  Charges:  $Therapeutic Exercise: 8-22 mins $Self Care/Home  Management: 8-22                     Baxter Flattery, PT  Acute Rehab Dept [facility]) [phone] Pager [phone]  10/14/2020    [facility] 10/14/2020, 12:38 PM

## 2020-10-14 NOTE — Progress Notes (Signed)
PROGRESS NOTE    [name]  UYQ:[id number] DOB: 13-Apr-1966 DOA: 10/10/2020 PCP: Fanny Bien, MD   Brief Narrative: 55 year old female morbidly obese type II diabetic with chronic lymphedema chronic nonhealing ulcers on both legs and calves and left upper thigh followed by wound clinic Dr. Asa Saunas was also being followed by home health nurses 3 times a week which has been terminated few weeks ago by insurance her insurance company.  Now her wounds have become much larger with increased drainage and pain.  She is unable to change the dressings by herself due to the location of the wound and morbid obesity.  She does not walk she only moves around in the house in the rolling chair 1-2 times per day.  She was not able to get to the restroom so she has been urinating in the bed which has made the wounds even much worse.  She lives alone.  She describes her pain as 10 out of 10 and has been receiving IV narcotics.  Chronic wounds and multiple areas of skin breakdown.  When patient came to the ER she had very old dressings still in place which were malodorous and had bloody drainage with chronic skin changes in the  periwound area.  She received Ancef in the ER.  Assessment & Plan:   Principal Problem:   Wound infection Active Problems:   Hyperkalemia   Chronic peripheral venous hypertension with lower extremity complication   Lymphedema   Obesity   Type 2 diabetes mellitus (HCC)   HTN (hypertension)  #1 chronic bilateral lower extremity wounds now with increased pain foul-smelling discharge.  Appreciate wound care input.  Patient's wounds are 100% pink and clean.DCd vancomycin and cefepime .  #2 type 2 diabetes-check hemoglobin A1c.  She is not on any medications at home.  We will cover her with SSI for now. CBG (last 3)  Recent Labs    10/13/20 2149 10/14/20 0742 10/14/20 1115  GLUCAP 147* 184* 179*     #3 chronic lymphedema she has not been using the lymphedema pump at  home.  #4 morbid obesity BMI 73.89.  #5 essential hypertension-blood pressure improved to 155/72 after starting her on metoprolol 12.5 twice daily along with Lasix.  Continue the same.    #6 hyperkalemia resolved with Lokelma  #7 tachycardia secondary to acute pain resolved  #8 disposition patient lives alone and is unable to care for herself.  She is unable to change the dressings on the wound because of location and morbid obesity and pain.  She does not ambulate.  She moves around in her Rollator couple of times during the day.  She is unable to cook for herself as she is unable to stand.  She will need placement.  She is an unsafe discharge back home.  She does not have any family members that can assist her.  #9 itching continue Atarax and hydrocortisone cream.    Pressure Injury 10/11/20 Thigh Left;Posterior (Active)  10/11/20 0041  Location: Thigh  Location Orientation: Left;Posterior  Staging:   Wound Description (Comments):   Present on Admission:      Pressure Injury 10/11/20 Tibial Left;Posterior;Proximal (Active)  10/11/20 0041  Location: Tibial  Location Orientation: Left;Posterior;Proximal  Staging:   Wound Description (Comments):   Present on Admission:      Estimated body mass index is 73.89 kg/m as calculated from the following:   Height as of this encounter: 5\' 10"  (1.778 m).   Weight as of this encounter: 233.6  kg.  DVT prophylaxis: Lovenox  code Status: Full code Family Communication: None at bedside  disposition Plan:  Status is: Observation   Dispo: The patient is from: Home              Anticipated d/c is to: SNF              Patient currently is medically stable to d/c.   Difficult to place patient unknown    Consultants: None  Procedures: None Antimicrobials: None  Subjective: She is resting in bed no new complaints Objective: Vitals:   10/13/20 1342 10/13/20 2147 10/14/20 0500 10/14/20 1318  BP: (!) 155/70 (!) 147/91 127/75 (!)  153/97  Pulse: (!) 102 (!) 105 (!) 101 75  Resp: 18 20 18 18   Temp: 98.3 F (36.8 C) 98.5 F (36.9 C) 98 F (36.7 C) 98.3 F (36.8 C)  TempSrc: Oral Oral Oral Oral  SpO2: 96% 91% 94% 91%  Weight:      Height:        Intake/Output Summary (Last 24 hours) at 10/14/2020 1330 Last data filed at 10/14/2020 0900 Gross per 24 hour  Intake 1320 ml  Output 2550 ml  Net -1230 ml   Filed Weights   10/10/20 1954  Weight: (!) 233.6 kg    Examination:  General exam: Appears calm and comfortable  Respiratory system: Clear to auscultation. Respiratory effort normal. Cardiovascular system: S1 & S2 heard, RRR. No JVD, murmurs, rubs, gallops or clicks. No pedal edema. Gastrointestinal system: Abdomen is nondistended, soft and nontender. No organomegaly or masses felt. Normal bowel sounds heard. Central nervous system: Alert and oriented. No focal neurological deficits. Extremities: 2+ edema Skin: Multiple wounds in bilateral lower extremities Psychiatry: Judgement and insight appear normal. Mood & affect appropriate.     Data Reviewed: I have personally reviewed following labs and imaging studies  CBC: Recent Labs  Lab 10/10/20 1610 10/11/20 0101 10/11/20 0306  WBC 7.8 7.8 7.9  NEUTROABS 5.3  --   --   HGB 12.8 11.9* 11.6*  HCT 45.4 40.2 40.3  MCV 102.7* 99.8 102.0*  PLT 216 246 858   Basic Metabolic Panel: Recent Labs  Lab 10/10/20 1556 10/11/20 0101 10/11/20 0306 10/12/20 0225  NA 138  --  141 140  K 5.6*  --  4.0 4.0  CL 108  --  109 105  CO2 20*  --  21* 25  GLUCOSE 135*  --  152* 136*  BUN 16  --  15 15  CREATININE 0.88 0.80 0.79 0.87  CALCIUM 8.6*  --  8.6* 8.7*   GFR: Estimated Creatinine Clearance: 160.6 mL/min (by C-G formula based on SCr of 0.87 mg/dL). Liver Function Tests: Recent Labs  Lab 10/11/20 0306  AST 17  ALT 12  ALKPHOS 56  BILITOT 0.8  PROT 7.0  ALBUMIN 3.2*   No results for input(s): LIPASE, AMYLASE in the last 168 hours. No results  for input(s): AMMONIA in the last 168 hours. Coagulation Profile: No results for input(s): INR, PROTIME in the last 168 hours. Cardiac Enzymes: No results for input(s): CKTOTAL, CKMB, CKMBINDEX, TROPONINI in the last 168 hours. BNP (last 3 results) No results for input(s): PROBNP in the last 8760 hours. HbA1C: No results for input(s): HGBA1C in the last 72 hours. CBG: Recent Labs  Lab 10/13/20 1218 10/13/20 1645 10/13/20 2149 10/14/20 0742 10/14/20 1115  GLUCAP 174* 161* 147* 184* 179*   Lipid Profile: No results for input(s): CHOL, HDL, LDLCALC, TRIG, CHOLHDL,  LDLDIRECT in the last 72 hours. Thyroid Function Tests: No results for input(s): TSH, T4TOTAL, FREET4, T3FREE, THYROIDAB in the last 72 hours. Anemia Panel: No results for input(s): VITAMINB12, FOLATE, FERRITIN, TIBC, IRON, RETICCTPCT in the last 72 hours. Sepsis Labs: No results for input(s): PROCALCITON, LATICACIDVEN in the last 168 hours.  Recent Results (from the past 240 hour(s))  SARS CORONAVIRUS 2 (TAT 6-24 HRS) Nasopharyngeal Nasopharyngeal Swab     Status: None   Collection Time: 10/10/20  5:00 PM   Specimen: Nasopharyngeal Swab  Result Value Ref Range Status   SARS Coronavirus 2 NEGATIVE NEGATIVE Final    Comment: (NOTE) SARS-CoV-2 target nucleic acids are NOT DETECTED.  The SARS-CoV-2 RNA is generally detectable in upper and lower respiratory specimens during the acute phase of infection. Negative results do not preclude SARS-CoV-2 infection, do not rule out co-infections with other pathogens, and should not be used as the sole basis for treatment or other patient management decisions. Negative results must be combined with clinical observations, patient history, and epidemiological information. The expected result is Negative.  Fact Sheet for Patients: SugarRoll.be  Fact Sheet for Healthcare Providers: https://www.woods-Jazelle Achey.com/  This test is not yet  approved or cleared by the [place] FDA and  has been authorized for detection and/or diagnosis of SARS-CoV-2 by FDA under an Emergency Use Authorization (EUA). This EUA will remain  in effect (meaning this test can be used) for the duration of the COVID-19 declaration under Se ction 564(b)(1) of the Act, 21 U.S.C. section 360bbb-3(b)(1), unless the authorization is terminated or revoked sooner.  Performed at [facility], [place] [address] [place], [place] [address]          Radiology Studies: No results found.      Scheduled Meds: . acetaminophen  1,000 mg Oral QHS  . enoxaparin (LOVENOX) injection  120 mg Subcutaneous Q24H  . ferrous sulfate  325 mg Oral Daily  . furosemide  40 mg Oral Daily  . hydrocortisone   Topical BID  . hydrOXYzine  10 mg Oral TID  . ibuprofen  800 mg Oral BID WC  . insulin aspart  0-20 Units Subcutaneous TID WC  . insulin aspart  0-5 Units Subcutaneous QHS  . metoprolol tartrate  12.5 mg Oral BID   Continuous Infusions:    LOS: 0 days   Georgette Shell, MD [facility]  10/14/2020, 1:30 PM

## 2020-10-14 NOTE — Plan of Care (Signed)
Plan of care discussed with pt.

## 2020-10-15 ENCOUNTER — Observation Stay (HOSPITAL_COMMUNITY): Payer: Medicare PPO

## 2020-10-15 DIAGNOSIS — T148XXA Other injury of unspecified body region, initial encounter: Secondary | ICD-10-CM | POA: Diagnosis not present

## 2020-10-15 DIAGNOSIS — L089 Local infection of the skin and subcutaneous tissue, unspecified: Secondary | ICD-10-CM | POA: Diagnosis not present

## 2020-10-15 LAB — GLUCOSE, CAPILLARY
Glucose-Capillary: 189 mg/dL — ABNORMAL HIGH (ref 70–99)
Glucose-Capillary: 161 mg/dL — ABNORMAL HIGH (ref 70–99)
Glucose-Capillary: 156 mg/dL — ABNORMAL HIGH (ref 70–99)
Glucose-Capillary: 149 mg/dL — ABNORMAL HIGH (ref 70–99)

## 2020-10-15 LAB — TROPONIN I (HIGH SENSITIVITY): Troponin I (High Sensitivity): 3 ng/L (ref <18)

## 2020-10-15 LAB — D-DIMER, QUANTITATIVE: D-Dimer, Quant: 1.08 ug/mL-FEU — ABNORMAL HIGH (ref 0.00–0.50)

## 2020-10-15 MED ORDER — SODIUM CHLORIDE 0.9 % IV SOLN
INTRAVENOUS | Status: DC | PRN
  Administered 2020-10-15: 250 mL via INTRAVENOUS

## 2020-10-15 MED ORDER — IOHEXOL 350 MG/ML SOLN: 100.0000 mL | Freq: Once | INTRAVENOUS | Status: DC | PRN

## 2020-10-15 MED ORDER — METHOCARBAMOL 1000 MG/10ML IJ SOLN
500.0000 mg | Freq: Once | INTRAVENOUS | Status: AC
  Administered 2020-10-15: 500 mg via INTRAVENOUS
  Filled 2020-10-15: qty 500

## 2020-10-15 NOTE — Progress Notes (Signed)
At shift change Patient reported her chest feeling tighter, no shortness of breath, and her left arm having slight numbness.  Reports having arm and chest sensation in the past.  Observed that heart rhythm Writer requested 12 lead EKG by NT at this time.  Informed CN. ECG reads "Sinus Tachycardia Otherwise normal ECG" Now reports continued leg/hip muscle cramping. See MAR.  On-Call updated.

## 2020-10-15 NOTE — Progress Notes (Signed)
PROGRESS NOTE    PADEN KURAS  ZOX:[id number] DOB: 05-23-1966 DOA: 10/10/2020 PCP: Fanny Bien, MD   Brief Narrative: 55 year old female morbidly obese type II diabetic with chronic lymphedema chronic nonhealing ulcers on both legs and calves and left upper thigh followed by wound clinic Dr. Asa Saunas was also being followed by home health nurses 3 times a week which has been terminated few weeks ago by insurance her insurance company.  Now her wounds have become much larger with increased drainage and pain.  She is unable to change the dressings by herself due to the location of the wound and morbid obesity.  She does not walk she only moves around in the house in the rolling chair 1-2 times per day.  She was not able to get to the restroom so she has been urinating in the bed which has made the wounds even much worse.  She lives alone.  She describes her pain as 10 out of 10 and has been receiving IV narcotics.  Chronic wounds and multiple areas of skin breakdown.  When patient came to the ER she had very old dressings still in place which were malodorous and had bloody drainage with chronic skin changes in the  periwound area.  She received Ancef in the ER.  Assessment & Plan:   Principal Problem:   Wound infection Active Problems:   Hyperkalemia   Chronic peripheral venous hypertension with lower extremity complication   Lymphedema   Obesity   Type 2 diabetes mellitus (HCC)   HTN (hypertension)  #1 chronic bilateral lower extremity wounds now with increased pain foul-smelling discharge.  Appreciate wound care input.  Patient's wounds are 100% pink and clean.DCd vancomycin and cefepime .  #2 type 2 diabetes- hemoglobin A1c 6.5.  She is not on any medications at home.  We will cover her with SSI for now.  She refuses to take metformin. CBG (last 3)  Recent Labs    10/14/20 2158 10/15/20 0745 10/15/20 1140  GLUCAP 136* 189* 161*     #3 chronic lymphedema she has not been  using the lymphedema pump at home.  #4 morbid obesity BMI 73.89.  #5 essential hypertension-blood pressure improved to 155/72 after starting her on metoprolol 12.5 twice daily along with Lasix.  Continue the same.    #6 hyperkalemia resolved with Lokelma  #7 tachycardia secondary to acute pain resolved  #8 disposition patient lives alone and is unable to care for herself.  She is unable to change the dressings on the wound because of location and morbid obesity and pain.  She does not ambulate.  She moves around in her Rollator couple of times during the day.  She is unable to cook for herself as she is unable to stand.  She will need placement.  She is an unsafe discharge back home.  She does not have any family members that can assist her.  #9 itching continue Atarax and hydrocortisone cream.    Pressure Injury 10/11/20 Thigh Left;Posterior (Active)  10/11/20 0041  Location: Thigh  Location Orientation: Left;Posterior  Staging:   Wound Description (Comments):   Present on Admission:      Pressure Injury 10/11/20 Tibial Left;Posterior;Proximal (Active)  10/11/20 0041  Location: Tibial  Location Orientation: Left;Posterior;Proximal  Staging:   Wound Description (Comments):   Present on Admission:      Estimated body mass index is 73.89 kg/m as calculated from the following:   Height as of this encounter: 5\' 10"  (1.778 m).  Weight as of this encounter: 233.6 kg.  DVT prophylaxis: Lovenox  code Status: Full code Family Communication: None at bedside  disposition Plan:  Status is: Observation   Dispo: The patient is from: Home              Anticipated d/c is to: SNF              Patient currently is medically stable to d/c.   Difficult to place patient yes due to bariatric bed    Consultants: None  Procedures: None Antimicrobials: None  Subjective: Patient resting in bed no new complaints Objective: Vitals:   10/14/20 1318 10/14/20 2156 10/15/20 0513 10/15/20  1449  BP: (!) 153/97 136/72 (!) 156/97 119/86  Pulse: 75 98 (!) 108 98  Resp: 18 18 18 20   Temp: 98.3 F (36.8 C) 98.9 F (37.2 C) 98.1 F (36.7 C) 98.4 F (36.9 C)  TempSrc: Oral Oral Oral Oral  SpO2: 91% 93% 92% 90%  Weight:      Height:        Intake/Output Summary (Last 24 hours) at 10/15/2020 1539 Last data filed at 10/15/2020 1500 Gross per 24 hour  Intake 1200 ml  Output 2775 ml  Net -1575 ml   Filed Weights   10/10/20 1954  Weight: (!) 233.6 kg    Examination:  General exam: Appears calm and comfortable  Respiratory system: Clear to auscultation. Respiratory effort normal. Cardiovascular system: S1 & S2 heard, RRR. No JVD, murmurs, rubs, gallops or clicks. No pedal edema. Gastrointestinal system: Abdomen is nondistended, soft and nontender. No organomegaly or masses felt. Normal bowel sounds heard. Central nervous system: Alert and oriented. No focal neurological deficits. Extremities: 2+ edema Skin: Multiple wounds in bilateral lower extremities Psychiatry: Judgement and insight appear normal. Mood & affect appropriate.     Data Reviewed: I have personally reviewed following labs and imaging studies  CBC: Recent Labs  Lab 10/10/20 1610 10/11/20 0101 10/11/20 0306  WBC 7.8 7.8 7.9  NEUTROABS 5.3  --   --   HGB 12.8 11.9* 11.6*  HCT 45.4 40.2 40.3  MCV 102.7* 99.8 102.0*  PLT 216 246 710   Basic Metabolic Panel: Recent Labs  Lab 10/10/20 1556 10/11/20 0101 10/11/20 0306 10/12/20 0225 10/14/20 1348  NA 138  --  141 140 141  K 5.6*  --  4.0 4.0 4.3  CL 108  --  109 105 103  CO2 20*  --  21* 25 28  GLUCOSE 135*  --  152* 136* 165*  BUN 16  --  15 15 15   CREATININE 0.88 0.80 0.79 0.87 0.72  CALCIUM 8.6*  --  8.6* 8.7* 8.9   GFR: Estimated Creatinine Clearance: 174.7 mL/min (by C-G formula based on SCr of 0.72 mg/dL). Liver Function Tests: Recent Labs  Lab 10/11/20 0306  AST 17  ALT 12  ALKPHOS 56  BILITOT 0.8  PROT 7.0  ALBUMIN 3.2*    No results for input(s): LIPASE, AMYLASE in the last 168 hours. No results for input(s): AMMONIA in the last 168 hours. Coagulation Profile: No results for input(s): INR, PROTIME in the last 168 hours. Cardiac Enzymes: No results for input(s): CKTOTAL, CKMB, CKMBINDEX, TROPONINI in the last 168 hours. BNP (last 3 results) No results for input(s): PROBNP in the last 8760 hours. HbA1C: No results for input(s): HGBA1C in the last 72 hours. CBG: Recent Labs  Lab 10/14/20 1115 10/14/20 1650 10/14/20 2158 10/15/20 0745 10/15/20 1140  GLUCAP 179* 169*  136* 189* 161*   Lipid Profile: No results for input(s): CHOL, HDL, LDLCALC, TRIG, CHOLHDL, LDLDIRECT in the last 72 hours. Thyroid Function Tests: No results for input(s): TSH, T4TOTAL, FREET4, T3FREE, THYROIDAB in the last 72 hours. Anemia Panel: No results for input(s): VITAMINB12, FOLATE, FERRITIN, TIBC, IRON, RETICCTPCT in the last 72 hours. Sepsis Labs: No results for input(s): PROCALCITON, LATICACIDVEN in the last 168 hours.  Recent Results (from the past 240 hour(s))  SARS CORONAVIRUS 2 (TAT 6-24 HRS) Nasopharyngeal Nasopharyngeal Swab     Status: None   Collection Time: 10/10/20  5:00 PM   Specimen: Nasopharyngeal Swab  Result Value Ref Range Status   SARS Coronavirus 2 NEGATIVE NEGATIVE Final    Comment: (NOTE) SARS-CoV-2 target nucleic acids are NOT DETECTED.  The SARS-CoV-2 RNA is generally detectable in upper and lower respiratory specimens during the acute phase of infection. Negative results do not preclude SARS-CoV-2 infection, do not rule out co-infections with other pathogens, and should not be used as the sole basis for treatment or other patient management decisions. Negative results must be combined with clinical observations, patient history, and epidemiological information. The expected result is Negative.  Fact Sheet for Patients: SugarRoll.be  Fact Sheet for Healthcare  Providers: https://www.woods-Lavanda Nevels.com/  This test is not yet approved or cleared by the [place] FDA and  has been authorized for detection and/or diagnosis of SARS-CoV-2 by FDA under an Emergency Use Authorization (EUA). This EUA will remain  in effect (meaning this test can be used) for the duration of the COVID-19 declaration under Se ction 564(b)(1) of the Act, 21 U.S.C. section 360bbb-3(b)(1), unless the authorization is terminated or revoked sooner.  Performed at [facility], [place] [address] [place], [place] [address]          Radiology Studies: No results found.      Scheduled Meds: . acetaminophen  1,000 mg Oral QHS  . enoxaparin (LOVENOX) injection  120 mg Subcutaneous Q24H  . ferrous sulfate  325 mg Oral Daily  . furosemide  40 mg Oral Daily  . hydrocortisone   Topical BID  . hydrOXYzine  10 mg Oral TID  . ibuprofen  800 mg Oral BID WC  . insulin aspart  0-20 Units Subcutaneous TID WC  . insulin aspart  0-5 Units Subcutaneous QHS  . metoprolol tartrate  12.5 mg Oral BID  . potassium chloride  20 mEq Oral Daily   Continuous Infusions:    LOS: 0 days   Georgette Shell, MD [facility]  10/15/2020, 3:39 PM

## 2020-10-15 NOTE — TOC Progression Note (Signed)
Transition of Care [facility]) - Progression Note    Patient Details  Name: [name] MRN: [id number] Date of Birth: 11-20-1965  Transition of [facility] CM/SW Contact  Lennart Pall, LCSW Phone Number: 10/15/2020, 2:51 PM  Clinical Narrative:    Continue to work on securing SNF bed for this bariatric patient.  FL2 sent out to all area facilities and have been calling SNFs in the [place] and [place] area as well per pt request.  Unfortunately, only denials at this point due to need for bariatric bed.  We do have insurance authorization, however, this is only good until 5/28 and will then need to start another auth if no SNF bed found.  Have reviewed case with Olga Coaster, [facility] Supervisor.     Expected Discharge Plan: [place] Barriers to Discharge: Continued Medical Work up  Expected Discharge Plan and Services Expected Discharge Plan: [place] In-house Referral: Clinical Social Work Discharge Planning Services: CM Consult Post Acute Care Choice: [place] Living arrangements for the past 2 months: Apartment                                       Social Determinants of Health (SDOH) Interventions    Readmission Risk Interventions No flowsheet data found.

## 2020-10-16 ENCOUNTER — Observation Stay (HOSPITAL_COMMUNITY): Payer: Medicare PPO

## 2020-10-16 DIAGNOSIS — I890 Lymphedema, not elsewhere classified: Secondary | ICD-10-CM | POA: Diagnosis not present

## 2020-10-16 DIAGNOSIS — Z6841 Body Mass Index (BMI) 40.0 and over, adult: Secondary | ICD-10-CM

## 2020-10-16 DIAGNOSIS — I10 Essential (primary) hypertension: Secondary | ICD-10-CM

## 2020-10-16 DIAGNOSIS — I87399 Chronic venous hypertension (idiopathic) with other complications of unspecified lower extremity: Secondary | ICD-10-CM | POA: Diagnosis not present

## 2020-10-16 DIAGNOSIS — T148XXA Other injury of unspecified body region, initial encounter: Secondary | ICD-10-CM | POA: Diagnosis not present

## 2020-10-16 DIAGNOSIS — E1169 Type 2 diabetes mellitus with other specified complication: Secondary | ICD-10-CM

## 2020-10-16 LAB — CBC WITH DIFFERENTIAL/PLATELET
Abs Immature Granulocytes: 0.08 10*3/uL — ABNORMAL HIGH (ref 0.00–0.07)
Basophils Absolute: 0 10*3/uL (ref 0.0–0.1)
Basophils Relative: 0 %
Eosinophils Absolute: 0.5 10*3/uL (ref 0.0–0.5)
Eosinophils Relative: 5 %
HCT: 44.8 % (ref 36.0–46.0)
Hemoglobin: 13.3 g/dL (ref 12.0–15.0)
Immature Granulocytes: 1 %
Lymphocytes Relative: 17 %
Lymphs Abs: 1.6 10*3/uL (ref 0.7–4.0)
MCH: 29.6 pg (ref 26.0–34.0)
MCHC: 29.7 g/dL — ABNORMAL LOW (ref 30.0–36.0)
MCV: 99.6 fL (ref 80.0–100.0)
Monocytes Absolute: 0.9 10*3/uL (ref 0.1–1.0)
Monocytes Relative: 9 %
Neutro Abs: 6.5 10*3/uL (ref 1.7–7.7)
Neutrophils Relative %: 68 %
Platelets: 273 10*3/uL (ref 150–400)
RBC: 4.5 MIL/uL (ref 3.87–5.11)
RDW: 14.2 % (ref 11.5–15.5)
WBC: 9.5 10*3/uL (ref 4.0–10.5)
nRBC: 0 % (ref 0.0–0.2)

## 2020-10-16 LAB — BASIC METABOLIC PANEL
Anion gap: 11 (ref 5–15)
BUN: 14 mg/dL (ref 6–20)
CO2: 24 mmol/L (ref 22–32)
Calcium: 8.9 mg/dL (ref 8.9–10.3)
Chloride: 98 mmol/L (ref 98–111)
Creatinine, Ser: 0.83 mg/dL (ref 0.44–1.00)
GFR, Estimated: >60 mL/min (ref >60)
Glucose, Bld: 184 mg/dL — ABNORMAL HIGH (ref 70–99)
Potassium: 4.5 mmol/L (ref 3.5–5.1)
Sodium: 133 mmol/L — ABNORMAL LOW (ref 135–145)

## 2020-10-16 LAB — GLUCOSE, CAPILLARY
Glucose-Capillary: 180 mg/dL — ABNORMAL HIGH (ref 70–99)
Glucose-Capillary: 151 mg/dL — ABNORMAL HIGH (ref 70–99)
Glucose-Capillary: 154 mg/dL — ABNORMAL HIGH (ref 70–99)
Glucose-Capillary: 139 mg/dL — ABNORMAL HIGH (ref 70–99)

## 2020-10-16 NOTE — Progress Notes (Signed)
Physical Therapy Treatment Patient Details Name: [name] MRN: [id number] DOB: [birth year] Today's Date: 10/16/2020    History of Present Illness Pt admitted from home with multiple LE wounds, and FTT and unable to perform basic self care.  Pt reports she stayed in lift chair getting up only twice a day for BM or getting food.  Pt utilizing Rollator around home but pivoted to it from lift chair and wheeled around in seated position.  Pt states she put pads and diapers in chair so she did not have to get up to urinate.  Pt with hx of DM and lymphadema (has been unable to use her pressure sleeves so lymphadema has significantly worsened since last December).    PT Comments    Pt assisted with rolling L and R in bed using bil UEs. +2 assist and bed pad used to complete turn. Maxisky used to transfer pt bed to chair. Pt able to to pull self forward in chair, perform AROM bil ankles and begin working  On glut and quad sets.  Pt is motivated to progress mobility; will continue to follow. Recommend SNF post acute.  Follow Up Recommendations  SNF     Equipment Recommendations  None recommended by PT    Recommendations for Other Services       Precautions / Restrictions Precautions Precautions: Fall Restrictions Weight Bearing Restrictions: No    Mobility  Bed Mobility Overal bed mobility: Needs Assistance   Rolling: Max assist;+2 for physical assistance;+2 for safety/equipment;Total assist         General bed mobility comments: assist of 2 to roll for placement of maxi-sky pad    Transfers Overall transfer level: Needs assistance               General transfer comment: bed to chair transfer with maxisky 1000. disposable pad used this session, will likely benefit from use of cloth pad next transfer. additional padding used L shoulder/upper arm and inner thighs to protect skin. sling placed under pillows (under LEs) d/t posterior thigh wounds  Ambulation/Gait                  Stairs             Wheelchair Mobility    Modified Rankin (Stroke Patients Only)       Balance                                            Cognition Arousal/Alertness: Awake/alert Behavior During Therapy: WFL for tasks assessed/performed Overall Cognitive Status: Within Functional Limits for tasks assessed                                        Exercises General Exercises - Lower Extremity Ankle Circles/Pumps: AROM;Both;10 reps Other Exercises Other Exercises: pt able to pull forward in chair bil UEs on chair arms x3 Other Exercises: encouraged glut sets, quad sets    General Comments        Pertinent Vitals/Pain Pain Assessment: Faces Faces Pain Scale: Hurts whole lot Pain Location: generalized but focused in bilat hips and thighs, knees pain increases with movement Pain Descriptors / Indicators: Grimacing;Spasm Pain Intervention(s): Limited activity within patient's tolerance;Monitored during session;Repositioned;Premedicated before session    Home Living  Prior Function            PT Goals (current goals can now be found in the care plan section) Acute Rehab PT Goals Patient Stated Goal: Regain health and IND to the point she can function at home, lose wt, be able to walk PT Goal Formulation: With patient Time For Goal Achievement: 10/24/20 Potential to Achieve Goals: Fair Progress towards PT goals: Progressing toward goals    Frequency    Min 2X/week      PT Plan Current plan remains appropriate    Co-evaluation              AM-PAC PT "6 Clicks" Mobility   Outcome Measure  Help needed turning from your back to your side while in a flat bed without using bedrails?: Total Help needed moving from lying on your back to sitting on the side of a flat bed without using bedrails?: Total Help needed moving to and from a bed to a chair (including a  wheelchair)?: Total Help needed standing up from a chair using your arms (e.g., wheelchair or bedside chair)?: Total Help needed to walk in hospital room?: Total Help needed climbing 3-5 steps with a railing? : Total 6 Click Score: 6    End of Session Equipment Utilized During Treatment: Other (comment) (maxisky) Activity Tolerance: Patient tolerated treatment well;Patient limited by pain Patient left: in chair;with call bell/phone within reach;with chair alarm set Nurse Communication: Mobility status;Need for lift equipment PT Visit Diagnosis: Muscle weakness (generalized) (M62.81);Difficulty in walking, not elsewhere classified (R26.2);Pain;Adult, failure to thrive (R62.7) Pain - Right/Left:  (bil) Pain - part of body: Knee;Leg (back)     Time: 2725-3664 PT Time Calculation (min) (ACUTE ONLY): 50 min  Charges:  $Therapeutic Activity: 38-52 mins                     Baxter Flattery, PT  Acute Rehab Dept ([facility]) [phone] Pager [phone]  10/16/2020    [facility] 10/16/2020, 12:30 PM

## 2020-10-16 NOTE — Progress Notes (Signed)
PROGRESS NOTE    [name]  CVE:[id number] DOB: [birth year] DOA: 10/10/2020 PCP: Fanny Bien, MD   Brief Narrative:  HPI from Dr Rodena Piety 54 year old female morbidly obese type II diabetic with chronic lymphedema chronic nonhealing ulcers on both legs and calves and left upper thigh followed by wound clinic Dr. Asa Saunas was also being followed by home health nurses 3 times a week which has been terminated few weeks ago by insurance her insurance company.  Now her wounds have become much larger with increased drainage and pain.  She is unable to change the dressings by herself due to the location of the wound and morbid obesity.  She does not walk she only moves around in the house in the rolling chair 1-2 times per day.  She was not able to get to the restroom so she has been urinating in the bed which has made the wounds even much worse.  She lives alone.  She describes her pain as 10 out of 10 and has been receiving IV narcotics.  Chronic wounds and multiple areas of skin breakdown.  When patient came to the ER she had very old dressings still in place which were malodorous and had bloody drainage with chronic skin changes in the  periwound area.  She received Ancef in the ER, admitted for further management.  Assessment & Plan:   Principal Problem:   Wound infection Active Problems:   Hyperkalemia   Chronic peripheral venous hypertension with lower extremity complication   Lymphedema   Obesity   Type 2 diabetes mellitus (HCC)   HTN (hypertension)   Chronic bilateral lower extremity wounds  Chronic Lymphedema Noted increased pain, foul-smelling discharge on admission, now improved, with wound 100% pink and clean Discontinued vancomycin, cefepime Appreciate wound care input Uses lymphedema pump at home  Type 2 diabetes mellitus Hemoglobin A1c 6.5 SSI, Accu-Cheks, hypoglycemic protocol  Not on any medications at home, refuses to take metformin  Essential  hypertension Started on metoprolol 12.5 twice daily along with Lasix  Mildly elevated d-dimer Noted mild tachycardia, HR in low 100s D-dimer mildly elevated Pt too heavy for CT scan, unable to do CTA chest, although current low suspicion for PE, but at high risk Continue to monitor  Morbid obesity BMI 73 Lifestyle modification advised  Disposition Patient lives alone and is unable to care for herself.  She is unable to change the dressings on the wound because of location and morbid obesity and pain.  She does not ambulate.  She moves around in her Rollator couple of times during the day.  She is unable to cook for herself as she is unable to stand.  She will need placement.  She is an unsafe discharge back home.  She does not have any family members that can assist her.      Pressure Injury 10/11/20 Thigh Left;Posterior (Active)  10/11/20 0041  Location: Thigh  Location Orientation: Left;Posterior  Staging:   Wound Description (Comments):   Present on Admission:      Pressure Injury 10/11/20 Tibial Left;Posterior;Proximal (Active)  10/11/20 0041  Location: Tibial  Location Orientation: Left;Posterior;Proximal  Staging:   Wound Description (Comments):   Present on Admission:      Estimated body mass index is 73.89 kg/m as calculated from the following:   Height as of this encounter: 5\' 10"  (1.778 m).   Weight as of this encounter: 233.6 kg.  DVT prophylaxis: Lovenox  code Status: Full code Family Communication: None at bedside  disposition  Plan:  Status is: Observation   Dispo: The patient is from: Home              Anticipated d/c is to: SNF              Patient currently is medically stable to d/c.   Difficult to place patient yes due to bariatric bed    Consultants: None  Procedures: None Antimicrobials: None  Subjective: She denies any new complaints.  Patient denies any chest pain, shortness of breath, abdominal pain, nausea/vomiting, fever/chills.   Just reports generalized pain worse around the lower extremities.   Objective: Vitals:   10/15/20 1449 10/15/20 2040 10/15/20 2228 10/16/20 0627  BP: 119/86 (!) 165/106 138/77 (!) 139/44  Pulse: 98 (!) 101 93 (!) 107  Resp: 20 17  16   Temp: 98.4 F (36.9 C) 98.4 F (36.9 C)  98.5 F (36.9 C)  TempSrc: Oral Oral  Oral  SpO2: 90% 93%  92%  Weight:      Height:        Intake/Output Summary (Last 24 hours) at 10/16/2020 1645 Last data filed at 10/16/2020 1100 Gross per 24 hour  Intake 1480 ml  Output 2150 ml  Net -670 ml   Filed Weights   10/10/20 1954  Weight: (!) 233.6 kg    Examination:  General: NAD   Cardiovascular: S1, S2 present  Respiratory: CTAB  Abdomen: Soft, nontender, nondistended, bowel sounds present  Musculoskeletal: 1+ bilateral pedal edema noted, lymphedema  Skin:  Noted bilateral chronic venous stasis changes, multiple wounds  Psychiatry: Normal mood   Data Reviewed: I have personally reviewed following labs and imaging studies  CBC: Recent Labs  Lab 10/10/20 1610 10/11/20 0101 10/11/20 0306 10/16/20 0950  WBC 7.8 7.8 7.9 9.5  NEUTROABS 5.3  --   --  6.5  HGB 12.8 11.9* 11.6* 13.3  HCT 45.4 40.2 40.3 44.8  MCV 102.7* 99.8 102.0* 99.6  PLT 216 246 244 616   Basic Metabolic Panel: Recent Labs  Lab 10/10/20 1556 10/11/20 0101 10/11/20 0306 10/12/20 0225 10/14/20 1348 10/16/20 0950  NA 138  --  141 140 141 133*  K 5.6*  --  4.0 4.0 4.3 4.5  CL 108  --  109 105 103 98  CO2 20*  --  21* 25 28 24   GLUCOSE 135*  --  152* 136* 165* 184*  BUN 16  --  15 15 15 14   CREATININE 0.88 0.80 0.79 0.87 0.72 0.83  CALCIUM 8.6*  --  8.6* 8.7* 8.9 8.9   GFR: Estimated Creatinine Clearance: 168.4 mL/min (by C-G formula based on SCr of 0.83 mg/dL). Liver Function Tests: Recent Labs  Lab 10/11/20 0306  AST 17  ALT 12  ALKPHOS 56  BILITOT 0.8  PROT 7.0  ALBUMIN 3.2*   No results for input(s): LIPASE, AMYLASE in the last 168 hours. No  results for input(s): AMMONIA in the last 168 hours. Coagulation Profile: No results for input(s): INR, PROTIME in the last 168 hours. Cardiac Enzymes: No results for input(s): CKTOTAL, CKMB, CKMBINDEX, TROPONINI in the last 168 hours. BNP (last 3 results) No results for input(s): PROBNP in the last 8760 hours. HbA1C: No results for input(s): HGBA1C in the last 72 hours. CBG: Recent Labs  Lab 10/15/20 1140 10/15/20 1643 10/15/20 2222 10/16/20 0739 10/16/20 1211  GLUCAP 161* 156* 149* 180* 151*   Lipid Profile: No results for input(s): CHOL, HDL, LDLCALC, TRIG, CHOLHDL, LDLDIRECT in the last 72 hours. Thyroid  Function Tests: No results for input(s): TSH, T4TOTAL, FREET4, T3FREE, THYROIDAB in the last 72 hours. Anemia Panel: No results for input(s): VITAMINB12, FOLATE, FERRITIN, TIBC, IRON, RETICCTPCT in the last 72 hours. Sepsis Labs: No results for input(s): PROCALCITON, LATICACIDVEN in the last 168 hours.  Recent Results (from the past 240 hour(s))  SARS CORONAVIRUS 2 (TAT 6-24 HRS) Nasopharyngeal Nasopharyngeal Swab     Status: None   Collection Time: 10/10/20  5:00 PM   Specimen: Nasopharyngeal Swab  Result Value Ref Range Status   SARS Coronavirus 2 NEGATIVE NEGATIVE Final    Comment: (NOTE) SARS-CoV-2 target nucleic acids are NOT DETECTED.  The SARS-CoV-2 RNA is generally detectable in upper and lower respiratory specimens during the acute phase of infection. Negative results do not preclude SARS-CoV-2 infection, do not rule out co-infections with other pathogens, and should not be used as the sole basis for treatment or other patient management decisions. Negative results must be combined with clinical observations, patient history, and epidemiological information. The expected result is Negative.  Fact Sheet for Patients: SugarRoll.be  Fact Sheet for Healthcare Providers: https://www.woods-mathews.com/  This test is  not yet approved or cleared by the [place] FDA and  has been authorized for detection and/or diagnosis of SARS-CoV-2 by FDA under an Emergency Use Authorization (EUA). This EUA will remain  in effect (meaning this test can be used) for the duration of the COVID-19 declaration under Se ction 564(b)(1) of the Act, 21 U.S.C. section 360bbb-3(b)(1), unless the authorization is terminated or revoked sooner.  Performed at [facility], [place] [address] [place], [place] [address]          Radiology Studies: DG Chest [place] 1 View  Result Date: 10/16/2020 CLINICAL DATA:  55 year old female with shortness of breath. EXAM: PORTABLE CHEST 1 VIEW COMPARISON:  None. FINDINGS: Portable AP semi upright view at 0819 hours. Low lung volumes. Mild cardiomegaly suspected. Other mediastinal contours are within normal limits. Visualized tracheal air column is within normal limits. No pneumothorax. Pulmonary vascularity remains within normal limits. Hypo ventilation at both lung bases greater on the left. No consolidation or pleural effusion identified. No acute osseous abnormality identified. IMPRESSION: Low lung volumes with suspected cardiomegaly and lung base hypo-ventilation, favor atelectasis but basilar infection difficult to exclude. Electronically Signed   By: Genevie Ann M.D.   On: 10/16/2020 09:18        Scheduled Meds: . acetaminophen  1,000 mg Oral QHS  . enoxaparin (LOVENOX) injection  120 mg Subcutaneous Q24H  . ferrous sulfate  325 mg Oral Daily  . furosemide  40 mg Oral Daily  . hydrocortisone   Topical BID  . hydrOXYzine  10 mg Oral TID  . ibuprofen  800 mg Oral BID WC  . insulin aspart  0-20 Units Subcutaneous TID WC  . insulin aspart  0-5 Units Subcutaneous QHS  . metoprolol tartrate  12.5 mg Oral BID  . potassium chloride  20 mEq Oral Daily   Continuous Infusions: . sodium chloride 250 mL (10/15/20 2301)     LOS: 0 days   Alma Friendly, MD [facility]  10/16/2020, 4:45 PM

## 2020-10-16 NOTE — Progress Notes (Signed)
Great effort used to maintain safety to get Patient onto Maxi-Sky lifting harness prior to transport to CT.  Patient unable to use Sky lift at CT.  Transfer board used with four assist.  CT table failed to lift Patient into machine R/T weight safety limit.  Patient transferred back to bed and transported back to 1328 Patient room.  Patient requesting pain med when due.  Text to On-Call of failed attempt to use CT at [facility].

## 2020-10-16 NOTE — TOC Progression Note (Signed)
Transition of Care [facility]) - Progression Note    Patient Details  Name: [name] MRN: [id number] Date of Birth: [birth year]  [facility]) CM/SW Contact  Lennart Pall, LCSW Phone Number: 10/16/2020, 4:07 PM  Clinical Narrative:    Continue to reach out to SNFs throughout the multiple areas and no offers still at this time.  I have reached out to several SNF admissions coordinators who have agreed to send messages to their "sister facilities" throughout the state to see if they can assist with our search as well.  TOC supervisor aware of this difficult to place patient.     Expected Discharge Plan: [place] Barriers to Discharge: Continued Medical Work up  Expected Discharge Plan and Services Expected Discharge Plan: [place] In-house Referral: Clinical Social Work Discharge Planning Services: CM Consult Post Acute Care Choice: [facility] arrangements for the past 2 months: Apartment                                       Social Determinants of Health (SDOH) Interventions    Readmission Risk Interventions No flowsheet data found.

## 2020-10-17 DIAGNOSIS — I87399 Chronic venous hypertension (idiopathic) with other complications of unspecified lower extremity: Secondary | ICD-10-CM | POA: Diagnosis not present

## 2020-10-17 DIAGNOSIS — E119 Type 2 diabetes mellitus without complications: Secondary | ICD-10-CM | POA: Diagnosis present

## 2020-10-17 DIAGNOSIS — E875 Hyperkalemia: Secondary | ICD-10-CM | POA: Diagnosis present

## 2020-10-17 DIAGNOSIS — Z6841 Body Mass Index (BMI) 40.0 and over, adult: Secondary | ICD-10-CM | POA: Diagnosis not present

## 2020-10-17 DIAGNOSIS — L03116 Cellulitis of left lower limb: Secondary | ICD-10-CM | POA: Diagnosis present

## 2020-10-17 DIAGNOSIS — R7989 Other specified abnormal findings of blood chemistry: Secondary | ICD-10-CM | POA: Diagnosis present

## 2020-10-17 DIAGNOSIS — R627 Adult failure to thrive: Secondary | ICD-10-CM | POA: Diagnosis present

## 2020-10-17 DIAGNOSIS — E876 Hypokalemia: Secondary | ICD-10-CM | POA: Diagnosis present

## 2020-10-17 DIAGNOSIS — L0390 Cellulitis, unspecified: Secondary | ICD-10-CM | POA: Diagnosis present

## 2020-10-17 DIAGNOSIS — M170 Bilateral primary osteoarthritis of knee: Secondary | ICD-10-CM | POA: Diagnosis present

## 2020-10-17 DIAGNOSIS — Z87891 Personal history of nicotine dependence: Secondary | ICD-10-CM | POA: Diagnosis not present

## 2020-10-17 DIAGNOSIS — L97919 Non-pressure chronic ulcer of unspecified part of right lower leg with unspecified severity: Secondary | ICD-10-CM | POA: Diagnosis present

## 2020-10-17 DIAGNOSIS — E1169 Type 2 diabetes mellitus with other specified complication: Secondary | ICD-10-CM | POA: Diagnosis not present

## 2020-10-17 DIAGNOSIS — L03115 Cellulitis of right lower limb: Secondary | ICD-10-CM | POA: Diagnosis present

## 2020-10-17 DIAGNOSIS — M160 Bilateral primary osteoarthritis of hip: Secondary | ICD-10-CM | POA: Diagnosis present

## 2020-10-17 DIAGNOSIS — G894 Chronic pain syndrome: Secondary | ICD-10-CM | POA: Diagnosis present

## 2020-10-17 DIAGNOSIS — L089 Local infection of the skin and subcutaneous tissue, unspecified: Secondary | ICD-10-CM | POA: Diagnosis not present

## 2020-10-17 DIAGNOSIS — L89899 Pressure ulcer of other site, unspecified stage: Secondary | ICD-10-CM | POA: Diagnosis present

## 2020-10-17 DIAGNOSIS — Z20822 Contact with and (suspected) exposure to covid-19: Secondary | ICD-10-CM | POA: Diagnosis present

## 2020-10-17 DIAGNOSIS — T148XXA Other injury of unspecified body region, initial encounter: Secondary | ICD-10-CM | POA: Diagnosis present

## 2020-10-17 DIAGNOSIS — I878 Other specified disorders of veins: Secondary | ICD-10-CM | POA: Diagnosis present

## 2020-10-17 DIAGNOSIS — K5900 Constipation, unspecified: Secondary | ICD-10-CM | POA: Diagnosis present

## 2020-10-17 DIAGNOSIS — L97929 Non-pressure chronic ulcer of unspecified part of left lower leg with unspecified severity: Secondary | ICD-10-CM | POA: Diagnosis present

## 2020-10-17 DIAGNOSIS — F32A Depression, unspecified: Secondary | ICD-10-CM | POA: Diagnosis present

## 2020-10-17 DIAGNOSIS — R5381 Other malaise: Secondary | ICD-10-CM | POA: Diagnosis present

## 2020-10-17 DIAGNOSIS — I10 Essential (primary) hypertension: Secondary | ICD-10-CM | POA: Diagnosis present

## 2020-10-17 DIAGNOSIS — I890 Lymphedema, not elsewhere classified: Secondary | ICD-10-CM | POA: Diagnosis present

## 2020-10-17 LAB — GLUCOSE, CAPILLARY
Glucose-Capillary: 164 mg/dL — ABNORMAL HIGH (ref 70–99)
Glucose-Capillary: 177 mg/dL — ABNORMAL HIGH (ref 70–99)
Glucose-Capillary: 151 mg/dL — ABNORMAL HIGH (ref 70–99)
Glucose-Capillary: 174 mg/dL — ABNORMAL HIGH (ref 70–99)

## 2020-10-17 NOTE — TOC Progression Note (Signed)
Transition of Care [facility]) - Progression Note   Patient Details  Name: [name] MRN: [id number] Date of Birth: December 06, 1965  Transition of Care [facility]) CM/SW [place], LCSW Phone Number: 10/17/2020, 2:53 PM  Clinical Narrative: CSW called [place] at [place] and spoke with [facility] in admissions. Per Sharlet Salina, the facility can accept bariatric patients for rehab. CSW faxed clinicals to [facility] at [place] [phone]). TOC awaiting response.  Expected Discharge Plan: [place] Barriers to Discharge: Continued Medical Work up  Expected Discharge Plan and Services Expected Discharge Plan: [place] In-house Referral: Clinical Social Work Discharge Planning Services: CM Consult Post Acute Care Choice: [place] arrangements for the past 2 months: Apartment  Readmission Risk Interventions No flowsheet data found.

## 2020-10-17 NOTE — Progress Notes (Signed)
Writer discussed wound care with Patient and that 0500 was the scheduled time in the computer.  Patient politely requested that wound care occur later since she had wound care late in the afternoon yesterday.  Writer medicated her for pain and requested that she use her call button for when she wanted it done.   Patient said she would have her lymph edema leg pumps brought from home so the doctor could help incorporate them in her care routine.

## 2020-10-17 NOTE — Progress Notes (Signed)
PROGRESS NOTE    [name]  DZH:[id number] DOB: February 06, 1966 DOA: 10/10/2020 PCP: Fanny Bien, MD   Brief Narrative:  HPI from Dr Rodena Piety 55 year old female morbidly obese type II diabetic with chronic lymphedema chronic nonhealing ulcers on both legs and calves and left upper thigh followed by wound clinic Dr. Asa Saunas was also being followed by home health nurses 3 times a week which has been terminated few weeks ago by insurance her insurance company.  Now her wounds have become much larger with increased drainage and pain.  She is unable to change the dressings by herself due to the location of the wound and morbid obesity.  She does not walk she only moves around in the house in the rolling chair 1-2 times per day.  She was not able to get to the restroom so she has been urinating in the bed which has made the wounds even much worse.  She lives alone.  She describes her pain as 10 out of 10 and has been receiving IV narcotics.  Chronic wounds and multiple areas of skin breakdown.  When patient came to the ER she had very old dressings still in place which were malodorous and had bloody drainage with chronic skin changes in the  periwound area.  She received Ancef in the ER, admitted for further management.  Assessment & Plan:   Principal Problem:   Wound infection Active Problems:   Hyperkalemia   Chronic peripheral venous hypertension with lower extremity complication   Lymphedema   Obesity   Type 2 diabetes mellitus (HCC)   HTN (hypertension)   Chronic bilateral lower extremity wounds  Chronic Lymphedema Noted increased pain, foul-smelling discharge on admission, now improved, with wound 100% pink and clean Discontinued vancomycin, cefepime Appreciate wound care input Uses lymphedema pump at home, advised to bring to hospital  Type 2 diabetes mellitus Hemoglobin A1c 6.5 SSI, Accu-Cheks, hypoglycemic protocol  Not on any medications at home, refuses to take  metformin  Essential hypertension Started on metoprolol 12.5 twice daily along with Lasix  Mildly elevated d-dimer Noted mild tachycardia, HR in low 100s D-dimer mildly elevated Pt too heavy for CT scan, unable to do CTA chest, although current low suspicion for PE, but at high risk Continue to monitor  Morbid obesity BMI 73 Lifestyle modification advised  Disposition Patient lives alone and is unable to care for herself.  She is unable to change the dressings on the wound because of location and morbid obesity and pain.  She does not ambulate.  She moves around in her Rollator couple of times during the day.  She is unable to cook for herself as she is unable to stand.  She will need placement.  She is an unsafe discharge back home.  She does not have any family members that can assist her.      Pressure Injury 10/11/20 Thigh Left;Posterior (Active)  10/11/20 0041  Location: Thigh  Location Orientation: Left;Posterior  Staging:   Wound Description (Comments):   Present on Admission:      Pressure Injury 10/11/20 Tibial Left;Posterior;Proximal (Active)  10/11/20 0041  Location: Tibial  Location Orientation: Left;Posterior;Proximal  Staging:   Wound Description (Comments):   Present on Admission:      Estimated body mass index is 73.89 kg/m as calculated from the following:   Height as of this encounter: 5\' 10"  (1.778 m).   Weight as of this encounter: 233.6 kg.  DVT prophylaxis: Lovenox  code Status: Full code Family Communication:  None at bedside  disposition Plan:  Status is: Observation   Dispo: The patient is from: Home              Anticipated d/c is to: SNF              Patient currently is medically stable to d/c.   Difficult to place patient yes due to bariatric bed    Consultants: None  Procedures: None Antimicrobials: None  Subjective: Patient requesting lymphedema pump, which patient states that it helps a lot with BLE swelling, okay for patient  to have been in the hospital.  Patient denies any chest pain, shortness of breath, abdominal pain, nausea/vomiting, fever/chills.   Objective: Vitals:   10/15/20 2228 10/16/20 0627 10/16/20 2100 10/17/20 0446  BP: 138/77 (!) 139/44 (!) 165/116 (!) 147/79  Pulse: 93 (!) 107 (!) 110 (!) 107  Resp:  16 16 18   Temp:  98.5 F (36.9 C) 98.3 F (36.8 C) 98.1 F (36.7 C)  TempSrc:  Oral Oral Oral  SpO2:  92% 92% 93%  Weight:      Height:        Intake/Output Summary (Last 24 hours) at 10/17/2020 1352 Last data filed at 10/17/2020 0600 Gross per 24 hour  Intake 1320 ml  Output 2050 ml  Net -730 ml   Filed Weights   10/10/20 1954  Weight: (!) 233.6 kg    Examination:  General: NAD   Cardiovascular: S1, S2 present  Respiratory: CTAB  Abdomen: Soft, nontender, nondistended, bowel sounds present  Musculoskeletal: 2+ bilateral pedal edema noted, lymphedema  Skin:  Noted bilateral chronic venous stasis changes, multiple wounds  Psychiatry: Normal mood   Data Reviewed: I have personally reviewed following labs and imaging studies  CBC: Recent Labs  Lab 10/10/20 1610 10/11/20 0101 10/11/20 0306 10/16/20 0950  WBC 7.8 7.8 7.9 9.5  NEUTROABS 5.3  --   --  6.5  HGB 12.8 11.9* 11.6* 13.3  HCT 45.4 40.2 40.3 44.8  MCV 102.7* 99.8 102.0* 99.6  PLT 216 246 244 542   Basic Metabolic Panel: Recent Labs  Lab 10/10/20 1556 10/11/20 0101 10/11/20 0306 10/12/20 0225 10/14/20 1348 10/16/20 0950  NA 138  --  141 140 141 133*  K 5.6*  --  4.0 4.0 4.3 4.5  CL 108  --  109 105 103 98  CO2 20*  --  21* 25 28 24   GLUCOSE 135*  --  152* 136* 165* 184*  BUN 16  --  15 15 15 14   CREATININE 0.88 0.80 0.79 0.87 0.72 0.83  CALCIUM 8.6*  --  8.6* 8.7* 8.9 8.9   GFR: Estimated Creatinine Clearance: 168.4 mL/min (by C-G formula based on SCr of 0.83 mg/dL). Liver Function Tests: Recent Labs  Lab 10/11/20 0306  AST 17  ALT 12  ALKPHOS 56  BILITOT 0.8  PROT 7.0  ALBUMIN 3.2*    No results for input(s): LIPASE, AMYLASE in the last 168 hours. No results for input(s): AMMONIA in the last 168 hours. Coagulation Profile: No results for input(s): INR, PROTIME in the last 168 hours. Cardiac Enzymes: No results for input(s): CKTOTAL, CKMB, CKMBINDEX, TROPONINI in the last 168 hours. BNP (last 3 results) No results for input(s): PROBNP in the last 8760 hours. HbA1C: No results for input(s): HGBA1C in the last 72 hours. CBG: Recent Labs  Lab 10/16/20 1211 10/16/20 1740 10/16/20 2054 10/17/20 0734 10/17/20 1236  GLUCAP 151* 154* 139* 164* 177*   Lipid Profile:  No results for input(s): CHOL, HDL, LDLCALC, TRIG, CHOLHDL, LDLDIRECT in the last 72 hours. Thyroid Function Tests: No results for input(s): TSH, T4TOTAL, FREET4, T3FREE, THYROIDAB in the last 72 hours. Anemia Panel: No results for input(s): VITAMINB12, FOLATE, FERRITIN, TIBC, IRON, RETICCTPCT in the last 72 hours. Sepsis Labs: No results for input(s): PROCALCITON, LATICACIDVEN in the last 168 hours.  Recent Results (from the past 240 hour(s))  SARS CORONAVIRUS 2 (TAT 6-24 HRS) Nasopharyngeal Nasopharyngeal Swab     Status: None   Collection Time: 10/10/20  5:00 PM   Specimen: Nasopharyngeal Swab  Result Value Ref Range Status   SARS Coronavirus 2 NEGATIVE NEGATIVE Final    Comment: (NOTE) SARS-CoV-2 target nucleic acids are NOT DETECTED.  The SARS-CoV-2 RNA is generally detectable in upper and lower respiratory specimens during the acute phase of infection. Negative results do not preclude SARS-CoV-2 infection, do not rule out co-infections with other pathogens, and should not be used as the sole basis for treatment or other patient management decisions. Negative results must be combined with clinical observations, patient history, and epidemiological information. The expected result is Negative.  Fact Sheet for Patients: SugarRoll.be  Fact Sheet for Healthcare  Providers: https://www.woods-mathews.com/  This test is not yet approved or cleared by the [place] FDA and  has been authorized for detection and/or diagnosis of SARS-CoV-2 by FDA under an Emergency Use Authorization (EUA). This EUA will remain  in effect (meaning this test can be used) for the duration of the COVID-19 declaration under Se ction 564(b)(1) of the Act, 21 U.S.C. section 360bbb-3(b)(1), unless the authorization is terminated or revoked sooner.  Performed at [facility], [place] [address], [place], [place] [address]          Radiology Studies: DG Chest [place] 1 View  Result Date: 10/16/2020 CLINICAL DATA:  55 year old female with shortness of breath. EXAM: PORTABLE CHEST 1 VIEW COMPARISON:  None. FINDINGS: Portable AP semi upright view at 0819 hours. Low lung volumes. Mild cardiomegaly suspected. Other mediastinal contours are within normal limits. Visualized tracheal air column is within normal limits. No pneumothorax. Pulmonary vascularity remains within normal limits. Hypo ventilation at both lung bases greater on the left. No consolidation or pleural effusion identified. No acute osseous abnormality identified. IMPRESSION: Low lung volumes with suspected cardiomegaly and lung base hypo-ventilation, favor atelectasis but basilar infection difficult to exclude. Electronically Signed   By: Genevie Ann M.D.   On: 10/16/2020 09:18        Scheduled Meds: . acetaminophen  1,000 mg Oral QHS  . enoxaparin (LOVENOX) injection  120 mg Subcutaneous Q24H  . ferrous sulfate  325 mg Oral Daily  . furosemide  40 mg Oral Daily  . hydrocortisone   Topical BID  . hydrOXYzine  10 mg Oral TID  . ibuprofen  800 mg Oral BID WC  . insulin aspart  0-20 Units Subcutaneous TID WC  . insulin aspart  0-5 Units Subcutaneous QHS  . metoprolol tartrate  12.5 mg Oral BID  . potassium chloride  20 mEq Oral Daily   Continuous Infusions: . sodium chloride 250 mL (10/15/20  2301)     LOS: 0 days   Alma Friendly, MD [facility]  10/17/2020, 1:52 PM

## 2020-10-17 NOTE — Progress Notes (Signed)
Inpatient Rehab Admissions Coordinator:   Requested to screen patient for possible CIR candidacy by Dianna with [facility] department.  Chart reviewed and it does not appear she is a candidate.  Pt lives alone and appears to be severely limited in her mobility at baseline.  She does not have any available support prior to admission or available once discharged, so do not feel that she would qualify for CIR at this time.  Agree with PT recommendations for SNF.  Please contact me with questions.    Shann Medal, PT, DPT Admissions Coordinator [phone] 10/17/20  2:14 PM

## 2020-10-17 NOTE — Evaluation (Signed)
Occupational Therapy Evaluation Patient Details Name: [name] MRN: [id number] DOB: Oct 14, 1965 Today's Date: 10/17/2020    History of Present Illness Pt admitted from home with multiple LE wounds, and FTT and unable to perform basic self care.  Pt reports she stayed in lift chair getting up only twice a day for BM or getting food.  Pt utilizing Rollator around home but pivoted to it from lift chair and wheeled around in seated position.  Pt states she put pads and diapers in chair so she did not have to get up to urinate.  Pt with hx of DM and lymphadema (has been unable to use her pressure sleeves so lymphadema has significantly worsened since last December).   Clinical Impression   Ms. [name] is a 55 year old woman admitted to hospital with above medical history. On evaluation she exhibits decreased strength of upper extremities and reports pain in left upper extremity at time, generalized weakness, decreased activity tolerance, and pain resulting in a decline in functional abilities. Barriers to progress include morbid obesity and lower extremity wounds. Patient requires set up to mod assist for UB ADLs and max to total assist for LB ADLs. Patient total assist for toileting and LB dressing at bed level. Patient required +3 assistance to roll and required hoyer lift to chair. Patient will benefit from skilled OT services while in hospital to improve deficits and learn compensatory strategies as needed in order to improve functional abilities.  Recommend short term rehab discharge at baseline to maximize patient's abilities.   Follow Up Recommendations  SNF    Equipment Recommendations  None recommended by OT    Recommendations for Other Services       Precautions / Restrictions Precautions Precautions: Fall Precaution Comments: LE wounds Restrictions Weight Bearing Restrictions: No      Mobility Bed Mobility Overal bed mobility: Needs Assistance Bed Mobility:  Rolling Rolling: Max assist         General bed mobility comments: max x 3 to roll left and right for placing pad. Patient is able to assist with UEs and with upper body movement    Transfers                 General transfer comment: +3 to transfer to recliner. Skin protected with use of bed pads. Improved tolerate today with rolling and transfer per patient. Did better when LEs supported during [facility] transfer.    Balance                                           ADL either performed or assessed with clinical judgement   ADL Overall ADL's : Needs assistance/impaired Eating/Feeding: Independent   Grooming: Set up;Sitting;Bed level   Upper Body Bathing: Set up;Sitting;Bed level   Lower Body Bathing: Maximal assistance;Bed level   Upper Body Dressing : Moderate assistance;Bed level;Sitting   Lower Body Dressing: Total assistance;Bed level       Toileting- Clothing Manipulation and Hygiene: Total assistance;Bed level               Vision Patient Visual Report: No change from baseline       Perception     Praxis      Pertinent Vitals/Pain Pain Assessment: Faces Pain Score: 8  Faces Pain Scale: Hurts whole lot Pain Location: generalized but focused in bilat hips and thighs, knees pain increases  with movement. doesn't tolerate much movement in BLE especially no hip movment noted at all, very stiff. Get's " spasms" in her back with very slow increments of the bed lowering into flat position. Pain Descriptors / Indicators: Grimacing;Spasm Pain Intervention(s): Monitored during session     Hand Dominance Right   Extremity/Trunk Assessment Upper Extremity Assessment Upper Extremity Assessment: RUE deficits/detail;LUE deficits/detail RUE Deficits / Details: WFL ROM, overall 4/5 strength RUE Sensation: WNL RUE Coordination: WNL LUE Deficits / Details: WFL ROM, reports pain in left shoulder at times, reports left wrist gets "Locked up at  times" and has transient numbness/tingling that could not be produced, overall 4-/5 strength LUE Sensation: WNL LUE Coordination: WNL   Lower Extremity Assessment Lower Extremity Assessment: Defer to PT evaluation       Communication Communication Communication: No difficulties   Cognition Arousal/Alertness: Awake/alert Behavior During Therapy: WFL for tasks assessed/performed Overall Cognitive Status: Within Functional Limits for tasks assessed                                 General Comments: went on disability in 2019   General Comments       Exercises Other Exercises Other Exercises: while in tilt position pt was able to hold her head upright independently for longer period of time, also worked with R / L quad extension exercises ( 5x each) and 5x glut stretches whill in tilt position.   Shoulder Instructions      Home Living Family/patient expects to be discharged to:: Skilled nursing facility Living Arrangements: Alone                                      Prior Functioning/Environment Level of Independence: Needs assistance  Gait / Transfers Assistance Needed: Stand pvt only and getting increasingly difficult ADL's / Homemaking Assistance Needed: Had been pivoting to bedside commode. As wounds got worse per report using the bathroom in the bed. Had been pivoting to seated rolator twice a day to get food.   Comments: Has been sleeping in lift chair. Reports hasn't been in bed since October.        OT Problem List: Decreased strength;Decreased activity tolerance;Decreased knowledge of use of DME or AE;Pain;Obesity;Increased edema      OT Treatment/Interventions: Self-care/ADL training;Therapeutic exercise;DME and/or AE instruction;Therapeutic activities;Patient/family education    OT Goals(Current goals can be found in the care plan section) Acute Rehab OT Goals Patient Stated Goal: improve independence and function OT Goal  Formulation: With patient Time For Goal Achievement: 10/31/20 Potential to Achieve Goals: Fair  OT Frequency: Min 2X/week   Barriers to D/C: Decreased caregiver support          Co-evaluation              AM-PAC OT "6 Clicks" Daily Activity     Outcome Measure Help from another person eating meals?: None Help from another person taking care of personal grooming?: A Little Help from another person toileting, which includes using toliet, bedpan, or urinal?: Total Help from another person bathing (including washing, rinsing, drying)?: A Lot Help from another person to put on and taking off regular upper body clothing?: A Lot Help from another person to put on and taking off regular lower body clothing?: Total 6 Click Score: 13   End of Session Nurse Communication: Mobility status;Need for lift  equipment  Activity Tolerance: Patient limited by pain Patient left: in chair;with call bell/phone within reach  OT Visit Diagnosis: Other abnormalities of gait and mobility (R26.89);Muscle weakness (generalized) (M62.81);Pain                Time: 1884-1660 OT Time Calculation (min): 39 min Charges:  OT General Charges $OT Visit: 1 Visit OT Evaluation $OT Eval Moderate Complexity: 1 Mod OT Treatments $Therapeutic Activity: 8-22 mins  Justun Anaya, OTR/L [place] [phone] Pager: [phone]   Lenward Chancellor 10/17/2020, 4:21 PM

## 2020-10-17 NOTE — Progress Notes (Signed)
Physical Therapy Treatment Patient Details Name: [name] MRN: [id number] DOB: [birth year] Today's Date: 10/17/2020    History of Present Illness Pt admitted from home with multiple LE wounds, and FTT and unable to perform basic self care.  Pt reports she stayed in lift chair getting up only twice a day for BM or getting food.  Pt utilizing Rollator around home but pivoted to it from lift chair and wheeled around in seated position.  Pt states she put pads and diapers in chair so she did not have to get up to urinate.  Pt with hx of DM and lymphadema (has been unable to use her pressure sleeves so lymphadema has significantly worsened since last December).    PT Comments    Kreg Tilt bed arrived for patient this afternoon. Assisted rep and nursing with hoyer lift onto the new bed. Pt tolerated lift better today and also began with first Tilt session today as well. Pt seems in better spirits , still very fearful and in pain in with very little movement,very stiff and in trunk and B LEs with very little movement with AROM or PROM. Pt was able to assist a little more with bed mobility and pulling on side rails to assist with rolling.   First tilt to 32 degrees with 50 KG weight bearing for 15 minutes. B knees are flexed and externally rotated, L greater than right with difficulty getting heels flat to weigh bear as well. The R LE is better than the left and she was able to perform quad contractions for reps.    Follow Up Recommendations  SNF     Equipment Recommendations  None recommended by PT    Recommendations for Other Services OT consult     Precautions / Restrictions Precautions Precautions: Fall    Mobility  Bed Mobility                 Start Time: 1355 Angle: 32 degrees Total Minutes in Angle: 15 minutes  Transfers                    Ambulation/Gait                 Stairs             Wheelchair Mobility    Modified Rankin  (Stroke Patients Only)       Balance                                            Cognition Arousal/Alertness: Awake/alert Behavior During Therapy: WFL for tasks assessed/performed Overall Cognitive Status: Within Functional Limits for tasks assessed                                 General Comments: went on disability in 2019      Exercises Other Exercises Other Exercises: while in tilt position pt was able to hold her head upright independently for longer period of time, also worked with R / L quad extension exercises ( 5x each) and 5x glut stretches whill in tilt position.    General Comments        Pertinent Vitals/Pain Pain Assessment: Faces Pain Score: 8  Faces Pain Scale: Hurts whole lot Pain Location: generalized but focused in bilat hips and thighs, knees pain  increases with movement. doesn't tolerate much movement in BLE especially no hip movment noted at all, very stiff. Get's " spasms" in her back with very slow increments of the bed lowering into flat position. Pain Descriptors / Indicators: Grimacing;Spasm Pain Intervention(s): Monitored during session;Repositioned    Home Living                      Prior Function            PT Goals (current goals can now be found in the care plan section) Acute Rehab PT Goals Patient Stated Goal: Regain health and IND to the point she can function at home, lose wt, be able to walk PT Goal Formulation: With patient Time For Goal Achievement: 10/24/20 Potential to Achieve Goals: Fair Progress towards PT goals: Progressing toward goals    Frequency    Min 2X/week      PT Plan Current plan remains appropriate    Co-evaluation              AM-PAC PT "6 Clicks" Mobility   Outcome Measure  Help needed turning from your back to your side while in a flat bed without using bedrails?: Total Help needed moving from lying on your back to sitting on the side of a flat bed  without using bedrails?: Total Help needed moving to and from a bed to a chair (including a wheelchair)?: Total Help needed standing up from a chair using your arms (e.g., wheelchair or bedside chair)?: Total Help needed to walk in hospital room?: Total Help needed climbing 3-5 steps with a railing? : Total 6 Click Score: 6    End of Session Equipment Utilized During Treatment:  (sky lift and Kreg TILT bed) Activity Tolerance: Patient tolerated treatment well;Patient limited by pain Patient left: in bed;with bed alarm set Nurse Communication: Mobility status;Need for lift equipment PT Visit Diagnosis: Muscle weakness (generalized) (M62.81);Difficulty in walking, not elsewhere classified (R26.2);Pain;Adult, failure to thrive (R62.7) Pain - part of body: Knee;Leg     Time: 1340-1430 PT Time Calculation (min) (ACUTE ONLY): 50 min  Charges:  $Therapeutic Exercise: 8-22 mins $Therapeutic Activity: 23-37 mins                     Brantley Wiley, PT, MPT Acute Rehabilitation Services Office: [phone] Pager: [phone] 10/17/2020    [name] 10/17/2020, 3:22 PM

## 2020-10-18 DIAGNOSIS — I890 Lymphedema, not elsewhere classified: Secondary | ICD-10-CM | POA: Diagnosis not present

## 2020-10-18 DIAGNOSIS — I10 Essential (primary) hypertension: Secondary | ICD-10-CM | POA: Diagnosis not present

## 2020-10-18 DIAGNOSIS — T148XXA Other injury of unspecified body region, initial encounter: Secondary | ICD-10-CM | POA: Diagnosis not present

## 2020-10-18 DIAGNOSIS — I87399 Chronic venous hypertension (idiopathic) with other complications of unspecified lower extremity: Secondary | ICD-10-CM | POA: Diagnosis not present

## 2020-10-18 LAB — GLUCOSE, CAPILLARY
Glucose-Capillary: 185 mg/dL — ABNORMAL HIGH (ref 70–99)
Glucose-Capillary: 155 mg/dL — ABNORMAL HIGH (ref 70–99)
Glucose-Capillary: 153 mg/dL — ABNORMAL HIGH (ref 70–99)

## 2020-10-18 MED ORDER — HYDROCHLOROTHIAZIDE 12.5 MG PO CAPS
12.5000 mg | ORAL_CAPSULE | Freq: Every day | ORAL | Status: DC
  Administered 2020-10-18 – 2020-11-02 (×16): 12.5 mg via ORAL
  Filled 2020-10-18 (×16): qty 1

## 2020-10-18 NOTE — Progress Notes (Signed)
PROGRESS NOTE    [name]  BPZ:[id number] DOB: 11/27/1965 DOA: 10/10/2020 PCP: Fanny Bien, MD   Brief Narrative:  HPI from Dr Rodena Piety 55 year old female morbidly obese type II diabetic with chronic lymphedema chronic nonhealing ulcers on both legs and calves and left upper thigh followed by wound clinic Dr. Asa Saunas was also being followed by home health nurses 3 times a week which has been terminated few weeks ago by insurance her insurance company.  Now her wounds have become much larger with increased drainage and pain.  She is unable to change the dressings by herself due to the location of the wound and morbid obesity.  She does not walk she only moves around in the house in the rolling chair 1-2 times per day.  She was not able to get to the restroom so she has been urinating in the bed which has made the wounds even much worse.  She lives alone.  She describes her pain as 10 out of 10 and has been receiving IV narcotics.  Chronic wounds and multiple areas of skin breakdown.  When patient came to the ER she had very old dressings still in place which were malodorous and had bloody drainage with chronic skin changes in the  periwound area.  She received Ancef in the ER, admitted for further management.    Assessment & Plan:   Principal Problem:   Wound infection Active Problems:   Hyperkalemia   Chronic peripheral venous hypertension with lower extremity complication   Lymphedema   Obesity   Type 2 diabetes mellitus (HCC)   HTN (hypertension)   Cellulitis   Chronic bilateral lower extremity wounds  Chronic Lymphedema Noted increased pain, foul-smelling discharge on admission, now improved, with wound 100% pink and clean Discontinued vancomycin, cefepime Appreciate wound care input Ok to use home lymphedema pump  Type 2 diabetes mellitus Hemoglobin A1c 6.5 SSI, Accu-Cheks, hypoglycemic protocol  Not on any medications at home, refuses to take  metformin  Essential hypertension Started on metoprolol 12.5 twice daily Start HCTZ 12.5 mg daily, d/c lasix  Mildly elevated d-dimer Noted mild tachycardia, HR in low 100s D-dimer mildly elevated Pt too heavy for CT scan, unable to do CTA chest, although current low suspicion for PE, but at high risk Continue to monitor  Morbid obesity BMI 73 Lifestyle modification advised  Disposition Patient lives alone and is unable to care for herself.  She is unable to change the dressings on the wound because of location and morbid obesity and pain.  She does not ambulate.  She moves around in her Rollator couple of times during the day.  She is unable to cook for herself as she is unable to stand.  She will need placement.  She is an unsafe discharge back home.  She does not have any family members that can assist her.      Pressure Injury 10/11/20 Thigh Left;Posterior (Active)  10/11/20 0041  Location: Thigh  Location Orientation: Left;Posterior  Staging:   Wound Description (Comments):   Present on Admission:      Pressure Injury 10/11/20 Tibial Left;Posterior;Proximal (Active)  10/11/20 0041  Location: Tibial  Location Orientation: Left;Posterior;Proximal  Staging:   Wound Description (Comments):   Present on Admission:      Estimated body mass index is 73.89 kg/m as calculated from the following:   Height as of this encounter: 5\' 10"  (1.778 m).   Weight as of this encounter: 233.6 kg.  DVT prophylaxis: Lovenox  code  Status: Full code Family Communication: None at bedside  disposition Plan:  Status is: Inpatient   Dispo: The patient is from: Home              Anticipated d/c is to: SNF              Patient currently is medically stable to d/c.   Difficult to place patient yes due to bariatric bed    Consultants: None  Procedures: None Antimicrobials: None  Subjective: Patient requesting to use her home lymphedema pump, denies any other new  complaints.   Objective: Vitals:   10/17/20 1446 10/17/20 2138 10/18/20 0131 10/18/20 0529  BP: (!) 154/94 (!) 158/78 (!) 149/59 (!) 167/103  Pulse: 100 69 70 70  Resp: 16 17 17 16   Temp: 98.2 F (36.8 C) 97.9 F (36.6 C) 98.2 F (36.8 C) 97.9 F (36.6 C)  TempSrc:  Oral Oral Oral  SpO2: 94% 98% 90% (!) 88%  Weight:      Height:        Intake/Output Summary (Last 24 hours) at 10/18/2020 1437 Last data filed at 10/18/2020 1328 Gross per 24 hour  Intake 950 ml  Output 2800 ml  Net -1850 ml   Filed Weights   10/10/20 1954  Weight: (!) 233.6 kg    Examination:  General: NAD   Cardiovascular: S1, S2 present  Respiratory: CTAB  Abdomen: Soft, nontender, nondistended, bowel sounds present  Musculoskeletal: 2+ bilateral pedal edema noted, lymphedema  Skin:  Noted bilateral chronic venous stasis changes, multiple wounds  Psychiatry: Normal mood   Data Reviewed: I have personally reviewed following labs and imaging studies  CBC: Recent Labs  Lab 10/16/20 0950  WBC 9.5  NEUTROABS 6.5  HGB 13.3  HCT 44.8  MCV 99.6  PLT 914   Basic Metabolic Panel: Recent Labs  Lab 10/12/20 0225 10/14/20 1348 10/16/20 0950  NA 140 141 133*  K 4.0 4.3 4.5  CL 105 103 98  CO2 25 28 24   GLUCOSE 136* 165* 184*  BUN 15 15 14   CREATININE 0.87 0.72 0.83  CALCIUM 8.7* 8.9 8.9   GFR: Estimated Creatinine Clearance: 168.4 mL/min (by C-G formula based on SCr of 0.83 mg/dL). Liver Function Tests: No results for input(s): AST, ALT, ALKPHOS, BILITOT, PROT, ALBUMIN in the last 168 hours. No results for input(s): LIPASE, AMYLASE in the last 168 hours. No results for input(s): AMMONIA in the last 168 hours. Coagulation Profile: No results for input(s): INR, PROTIME in the last 168 hours. Cardiac Enzymes: No results for input(s): CKTOTAL, CKMB, CKMBINDEX, TROPONINI in the last 168 hours. BNP (last 3 results) No results for input(s): PROBNP in the last 8760 hours. HbA1C: No  results for input(s): HGBA1C in the last 72 hours. CBG: Recent Labs  Lab 10/17/20 1236 10/17/20 1718 10/17/20 2038 10/18/20 0812 10/18/20 1153  GLUCAP 177* 151* 174* 185* 155*   Lipid Profile: No results for input(s): CHOL, HDL, LDLCALC, TRIG, CHOLHDL, LDLDIRECT in the last 72 hours. Thyroid Function Tests: No results for input(s): TSH, T4TOTAL, FREET4, T3FREE, THYROIDAB in the last 72 hours. Anemia Panel: No results for input(s): VITAMINB12, FOLATE, FERRITIN, TIBC, IRON, RETICCTPCT in the last 72 hours. Sepsis Labs: No results for input(s): PROCALCITON, LATICACIDVEN in the last 168 hours.  Recent Results (from the past 240 hour(s))  SARS CORONAVIRUS 2 (TAT 6-24 HRS) Nasopharyngeal Nasopharyngeal Swab     Status: None   Collection Time: 10/10/20  5:00 PM   Specimen: Nasopharyngeal Swab  Result Value Ref Range Status   SARS Coronavirus 2 NEGATIVE NEGATIVE Final    Comment: (NOTE) SARS-CoV-2 target nucleic acids are NOT DETECTED.  The SARS-CoV-2 RNA is generally detectable in upper and lower respiratory specimens during the acute phase of infection. Negative results do not preclude SARS-CoV-2 infection, do not rule out co-infections with other pathogens, and should not be used as the sole basis for treatment or other patient management decisions. Negative results must be combined with clinical observations, patient history, and epidemiological information. The expected result is Negative.  Fact Sheet for Patients: SugarRoll.be  Fact Sheet for Healthcare Providers: https://www.woods-mathews.com/  This test is not yet approved or cleared by the [place] FDA and  has been authorized for detection and/or diagnosis of SARS-CoV-2 by FDA under an Emergency Use Authorization (EUA). This EUA will remain  in effect (meaning this test can be used) for the duration of the COVID-19 declaration under Se ction 564(b)(1) of the Act, 21  U.S.C. section 360bbb-3(b)(1), unless the authorization is terminated or revoked sooner.  Performed at [facility], [place] [address], [place], [place] [address]          Radiology Studies: No results found.      Scheduled Meds: . acetaminophen  1,000 mg Oral QHS  . enoxaparin (LOVENOX) injection  120 mg Subcutaneous Q24H  . ferrous sulfate  325 mg Oral Daily  . hydrochlorothiazide  12.5 mg Oral Daily  . hydrocortisone   Topical BID  . hydrOXYzine  10 mg Oral TID  . insulin aspart  0-20 Units Subcutaneous TID WC  . insulin aspart  0-5 Units Subcutaneous QHS  . metoprolol tartrate  12.5 mg Oral BID  . potassium chloride  20 mEq Oral Daily   Continuous Infusions: . sodium chloride 250 mL (10/15/20 2301)     LOS: 1 day   Alma Friendly, MD [facility]  10/18/2020, 2:37 PM

## 2020-10-18 NOTE — Progress Notes (Signed)
Physical Therapy Treatment Patient Details Name: [name] MRN: [id number] DOB: 06/08/1966 Today's Date: 10/18/2020    History of Present Illness Pt admitted from home with multiple LE wounds, and FTT and unable to perform basic self care.  Pt reports she stayed in lift chair getting up only twice a day for BM or getting food.  Pt utilizing Rollator around home but pivoted to it from lift chair and wheeled around in seated position.  Pt states she put pads and diapers in chair so she did not have to get up to urinate.  Pt with hx of DM and lymphadema (has been unable to use her pressure sleeves so lymphadema has significantly worsened since last December).    PT Comments    Pt could not get out of bed today due to stomach pain and rumbling with potential for BM later . So we worked in tilt position for LE improved positioning, AAROM, exercises with B quads and B LES while in tilt today. Pt still has a lot of pain with any movement and very stiff , rigid LEs.  Motivated, but takes a little encouraging initially, wants to improve and be included in the plan and knowing what is going on. Worked in tilt position at [employer] titlt for 15 minutes today before too much pain in LE set in and LE got weak.   Follow Up Recommendations  SNF     Equipment Recommendations  None recommended by PT    Recommendations for Other Services OT consult     Precautions / Restrictions Precautions Precautions: Fall Precaution Comments: LE wounds    Mobility  Bed Mobility                 Start Time: 1420 Angle: 35 degrees Total Minutes in Angle: 20 minutes (55kg weight bearing)  Transfers                    Ambulation/Gait                 Stairs             Wheelchair Mobility    Modified Rankin (Stroke Patients Only)       Balance                                            Cognition Arousal/Alertness: Awake/alert Behavior During  Therapy: WFL for tasks assessed/performed Overall Cognitive Status: Within Functional Limits for tasks assessed                                 General Comments: went on disability in 2019      Exercises Other Exercises Other Exercises: while in tilt position pt was able to hold her head upright independently for longer period of time, also worked with R / L quad extension exercises ( 5x each) and 5x glut stretches whill in tilt position. Other Exercises: while in tilt worked on UE exercises : orange TB : B UE 10x each: bicep curls, tricep extenstions, alternbate " high five for cross body shoulder extension and reach, and 10x small abdomainl crunches . Tied TB to her bed so she can do this independently as well, Other Exercises: tried to do gentle ROM for ANT hip AAROM for hip AB or AD duction. Very  little movement but very slight improvment and positioning of LE in extension for tilting today.    General Comments        Pertinent Vitals/Pain Pain Assessment: Faces Faces Pain Scale: Hurts even more Pain Location: hurts in her back and LEs mostly with any bed movement or position change. LLE more painful than right. LE very titght with no AB or AD duction !! Pain Descriptors / Indicators: Grimacing;Spasm Pain Intervention(s): Limited activity within patient's tolerance;Monitored during session;Repositioned    Home Living                      Prior Function            PT Goals (current goals can now be found in the care plan section) Acute Rehab PT Goals Patient Stated Goal: Regain health and IND to the point she can function at home, lose wt, be able to walk PT Goal Formulation: With patient Time For Goal Achievement: 10/24/20 Potential to Achieve Goals: Fair    Frequency    Min 2X/week      PT Plan Current plan remains appropriate    Co-evaluation              AM-PAC PT "6 Clicks" Mobility   Outcome Measure  Help needed turning from  your back to your side while in a flat bed without using bedrails?: Total Help needed moving from lying on your back to sitting on the side of a flat bed without using bedrails?: Total Help needed moving to and from a bed to a chair (including a wheelchair)?: Total Help needed standing up from a chair using your arms (e.g., wheelchair or bedside chair)?: Total Help needed to walk in hospital room?: Total Help needed climbing 3-5 steps with a railing? : Total 6 Click Score: 6    End of Session Equipment Utilized During Treatment:  (sky lift and Kreg TILT bed) Activity Tolerance: Patient tolerated treatment well;Patient limited by pain Patient left: in bed;with bed alarm set Nurse Communication: Mobility status;Need for lift equipment PT Visit Diagnosis: Muscle weakness (generalized) (M62.81);Difficulty in walking, not elsewhere classified (R26.2);Pain;Adult, failure to thrive (R62.7) Pain - part of body: Knee;Leg     Time: 1420-1510 PT Time Calculation (min) (ACUTE ONLY): 50 min  Charges:  $Therapeutic Exercise: 8-22 mins $Therapeutic Activity: 8-22 mins                     Jocilynn Grade, PT, MPT Acute Rehabilitation Services Office: [phone] Pager: [phone] 10/18/2020    [name] 10/18/2020, 4:36 PM

## 2020-10-18 NOTE — TOC Progression Note (Signed)
Transition of Care [facility]) - Progression Note   Patient Details  Name: [name] MRN: [id number] Date of Birth: [birth year]  Transition of Care [facility]) CM/SW [place], LCSW Phone Number: 10/18/2020, 1:32 PM  Clinical Narrative: CSW left voicemail for Loria with [facility] requesting follow up regarding SNF referral. CSW spoke to admissions at [facility] and the facility is willing to review the referral. Referral faxed to facility [phone]). TOC to awaiting bed offers.  Expected Discharge Plan: [place] Barriers to Discharge: Continued Medical Work up  Expected Discharge Plan and Services Expected Discharge Plan: [place] In-house Referral: Clinical Social Work Discharge Planning Services: CM Consult Post Acute Care Choice: [place] arrangements for the past 2 months: Apartment  Readmission Risk Interventions No flowsheet data found.

## 2020-10-19 DIAGNOSIS — I10 Essential (primary) hypertension: Secondary | ICD-10-CM | POA: Diagnosis not present

## 2020-10-19 DIAGNOSIS — I87399 Chronic venous hypertension (idiopathic) with other complications of unspecified lower extremity: Secondary | ICD-10-CM | POA: Diagnosis not present

## 2020-10-19 DIAGNOSIS — I890 Lymphedema, not elsewhere classified: Secondary | ICD-10-CM | POA: Diagnosis not present

## 2020-10-19 DIAGNOSIS — T148XXA Other injury of unspecified body region, initial encounter: Secondary | ICD-10-CM | POA: Diagnosis not present

## 2020-10-19 LAB — BASIC METABOLIC PANEL
Anion gap: 10 (ref 5–15)
BUN: 16 mg/dL (ref 6–20)
CO2: 26 mmol/L (ref 22–32)
Calcium: 9.1 mg/dL (ref 8.9–10.3)
Chloride: 99 mmol/L (ref 98–111)
Creatinine, Ser: 0.85 mg/dL (ref 0.44–1.00)
GFR, Estimated: >60 mL/min (ref >60)
Glucose, Bld: 170 mg/dL — ABNORMAL HIGH (ref 70–99)
Potassium: 3.8 mmol/L (ref 3.5–5.1)
Sodium: 135 mmol/L (ref 135–145)

## 2020-10-19 LAB — CBC WITH DIFFERENTIAL/PLATELET
Abs Immature Granulocytes: 0.04 10*3/uL (ref 0.00–0.07)
Basophils Absolute: 0.1 10*3/uL (ref 0.0–0.1)
Basophils Relative: 1 %
Eosinophils Absolute: 0.4 10*3/uL (ref 0.0–0.5)
Eosinophils Relative: 5 %
HCT: 42.9 % (ref 36.0–46.0)
Hemoglobin: 12.6 g/dL (ref 12.0–15.0)
Immature Granulocytes: 1 %
Lymphocytes Relative: 20 %
Lymphs Abs: 1.7 10*3/uL (ref 0.7–4.0)
MCH: 29.3 pg (ref 26.0–34.0)
MCHC: 29.4 g/dL — ABNORMAL LOW (ref 30.0–36.0)
MCV: 99.8 fL (ref 80.0–100.0)
Monocytes Absolute: 0.8 10*3/uL (ref 0.1–1.0)
Monocytes Relative: 10 %
Neutro Abs: 5.4 10*3/uL (ref 1.7–7.7)
Neutrophils Relative %: 63 %
Platelets: 291 10*3/uL (ref 150–400)
RBC: 4.3 MIL/uL (ref 3.87–5.11)
RDW: 14 % (ref 11.5–15.5)
WBC: 8.5 10*3/uL (ref 4.0–10.5)
nRBC: 0 % (ref 0.0–0.2)

## 2020-10-19 LAB — GLUCOSE, CAPILLARY
Glucose-Capillary: 138 mg/dL — ABNORMAL HIGH (ref 70–99)
Glucose-Capillary: 149 mg/dL — ABNORMAL HIGH (ref 70–99)
Glucose-Capillary: 170 mg/dL — ABNORMAL HIGH (ref 70–99)
Glucose-Capillary: 175 mg/dL — ABNORMAL HIGH (ref 70–99)
Glucose-Capillary: 176 mg/dL — ABNORMAL HIGH (ref 70–99)

## 2020-10-19 MED ORDER — SENNOSIDES-DOCUSATE SODIUM 8.6-50 MG PO TABS
1.0000 | ORAL_TABLET | Freq: Two times a day (BID) | ORAL | Status: DC
  Administered 2020-10-19 – 2020-11-10 (×41): 1 via ORAL
  Filled 2020-10-19 (×44): qty 1

## 2020-10-19 MED ORDER — POLYETHYLENE GLYCOL 3350 17 G PO PACK
17.0000 g | PACK | Freq: Every day | ORAL | Status: DC
  Administered 2020-10-19 – 2020-11-03 (×13): 17 g via ORAL
  Filled 2020-10-19 (×15): qty 1

## 2020-10-19 NOTE — Plan of Care (Signed)
  Problem: Pain Managment: Goal: General experience of comfort will improve Outcome: Progressing   

## 2020-10-19 NOTE — Progress Notes (Signed)
PROGRESS NOTE    [name]  BPZ:[id number] DOB: 11/27/1965 DOA: 10/10/2020 PCP: Fanny Bien, MD   Brief Narrative:  HPI from Dr Rodena Piety 55 year old female morbidly obese type II diabetic with chronic lymphedema chronic nonhealing ulcers on both legs and calves and left upper thigh followed by wound clinic Dr. Asa Saunas was also being followed by home health nurses 3 times a week which has been terminated few weeks ago by insurance her insurance company.  Now her wounds have become much larger with increased drainage and pain.  She is unable to change the dressings by herself due to the location of the wound and morbid obesity.  She does not walk she only moves around in the house in the rolling chair 1-2 times per day.  She was not able to get to the restroom so she has been urinating in the bed which has made the wounds even much worse.  She lives alone.  She describes her pain as 10 out of 10 and has been receiving IV narcotics.  Chronic wounds and multiple areas of skin breakdown.  When patient came to the ER she had very old dressings still in place which were malodorous and had bloody drainage with chronic skin changes in the  periwound area.  She received Ancef in the ER, admitted for further management.    Assessment & Plan:   Principal Problem:   Wound infection Active Problems:   Hyperkalemia   Chronic peripheral venous hypertension with lower extremity complication   Lymphedema   Obesity   Type 2 diabetes mellitus (HCC)   HTN (hypertension)   Cellulitis   Chronic bilateral lower extremity wounds  Chronic Lymphedema Noted increased pain, foul-smelling discharge on admission, now improved, with wound 100% pink and clean Discontinued vancomycin, cefepime Appreciate wound care input Ok to use home lymphedema pump  Type 2 diabetes mellitus Hemoglobin A1c 6.5 SSI, Accu-Cheks, hypoglycemic protocol  Not on any medications at home, refuses to take  metformin  Essential hypertension Started on metoprolol 12.5 twice daily Start HCTZ 12.5 mg daily, d/c lasix  Mildly elevated d-dimer Noted mild tachycardia, HR in low 100s D-dimer mildly elevated Pt too heavy for CT scan, unable to do CTA chest, although current low suspicion for PE, but at high risk Continue to monitor  Morbid obesity BMI 73 Lifestyle modification advised  Disposition Patient lives alone and is unable to care for herself.  She is unable to change the dressings on the wound because of location and morbid obesity and pain.  She does not ambulate.  She moves around in her Rollator couple of times during the day.  She is unable to cook for herself as she is unable to stand.  She will need placement.  She is an unsafe discharge back home.  She does not have any family members that can assist her.      Pressure Injury 10/11/20 Thigh Left;Posterior (Active)  10/11/20 0041  Location: Thigh  Location Orientation: Left;Posterior  Staging:   Wound Description (Comments):   Present on Admission:      Pressure Injury 10/11/20 Tibial Left;Posterior;Proximal (Active)  10/11/20 0041  Location: Tibial  Location Orientation: Left;Posterior;Proximal  Staging:   Wound Description (Comments):   Present on Admission:      Estimated body mass index is 73.89 kg/m as calculated from the following:   Height as of this encounter: 5\' 10"  (1.778 m).   Weight as of this encounter: 233.6 kg.  DVT prophylaxis: Lovenox  code  Status: Full code Family Communication: None at bedside  disposition Plan:  Status is: Inpatient   Dispo: The patient is from: Home              Anticipated d/c is to: SNF              Patient currently is medically stable to d/c.   Difficult to place patient yes due to bariatric bed    Consultants: None  Procedures: None Antimicrobials: None  Subjective: Patient denies any new complaints, denies any shortness of breath, chest pain, abdominal pain,  nausea/vomiting, fever/chills.   Objective: Vitals:   10/18/20 1400 10/18/20 2049 10/18/20 2152 10/19/20 0529  BP: (!) 161/91  (!) 148/55 137/61  Pulse: (!) 102 88 70 80  Resp:   16 16  Temp: 98.6 F (37 C)  99.1 F (37.3 C) 98.1 F (36.7 C)  TempSrc: Oral  Oral Oral  SpO2: 99%  (!) 89% 90%  Weight:      Height:        Intake/Output Summary (Last 24 hours) at 10/19/2020 1454 Last data filed at 10/19/2020 1000 Gross per 24 hour  Intake 220 ml  Output 1000 ml  Net -780 ml   Filed Weights   10/10/20 1954  Weight: (!) 233.6 kg    Examination:  General: NAD, morbidly obese  Cardiovascular: S1, S2 present  Respiratory:  Diminished breath sounds bilaterally  Abdomen: Soft, obese, nontender, nondistended, bowel sounds present  Musculoskeletal: 2+ bilateral pedal edema noted, lymphedema  Skin:  Noted bilateral chronic venous stasis changes, multiple wounds  Psychiatry: Normal mood   Data Reviewed: I have personally reviewed following labs and imaging studies  CBC: Recent Labs  Lab 10/16/20 0950 10/19/20 0548  WBC 9.5 8.5  NEUTROABS 6.5 5.4  HGB 13.3 12.6  HCT 44.8 42.9  MCV 99.6 99.8  PLT 273 025   Basic Metabolic Panel: Recent Labs  Lab 10/14/20 1348 10/16/20 0950 10/19/20 0548  NA 141 133* 135  K 4.3 4.5 3.8  CL 103 98 99  CO2 28 24 26   GLUCOSE 165* 184* 170*  BUN 15 14 16   CREATININE 0.72 0.83 0.85  CALCIUM 8.9 8.9 9.1   GFR: Estimated Creatinine Clearance: 164.4 mL/min (by C-G formula based on SCr of 0.85 mg/dL). Liver Function Tests: No results for input(s): AST, ALT, ALKPHOS, BILITOT, PROT, ALBUMIN in the last 168 hours. No results for input(s): LIPASE, AMYLASE in the last 168 hours. No results for input(s): AMMONIA in the last 168 hours. Coagulation Profile: No results for input(s): INR, PROTIME in the last 168 hours. Cardiac Enzymes: No results for input(s): CKTOTAL, CKMB, CKMBINDEX, TROPONINI in the last 168 hours. BNP (last 3  results) No results for input(s): PROBNP in the last 8760 hours. HbA1C: No results for input(s): HGBA1C in the last 72 hours. CBG: Recent Labs  Lab 10/18/20 1153 10/18/20 1658 10/19/20 0025 10/19/20 0736 10/19/20 1204  GLUCAP 155* 153* 170* 175* 176*   Lipid Profile: No results for input(s): CHOL, HDL, LDLCALC, TRIG, CHOLHDL, LDLDIRECT in the last 72 hours. Thyroid Function Tests: No results for input(s): TSH, T4TOTAL, FREET4, T3FREE, THYROIDAB in the last 72 hours. Anemia Panel: No results for input(s): VITAMINB12, FOLATE, FERRITIN, TIBC, IRON, RETICCTPCT in the last 72 hours. Sepsis Labs: No results for input(s): PROCALCITON, LATICACIDVEN in the last 168 hours.  Recent Results (from the past 240 hour(s))  SARS CORONAVIRUS 2 (TAT 6-24 HRS) Nasopharyngeal Nasopharyngeal Swab     Status: None  Collection Time: 10/10/20  5:00 PM   Specimen: Nasopharyngeal Swab  Result Value Ref Range Status   SARS Coronavirus 2 NEGATIVE NEGATIVE Final    Comment: (NOTE) SARS-CoV-2 target nucleic acids are NOT DETECTED.  The SARS-CoV-2 RNA is generally detectable in upper and lower respiratory specimens during the acute phase of infection. Negative results do not preclude SARS-CoV-2 infection, do not rule out co-infections with other pathogens, and should not be used as the sole basis for treatment or other patient management decisions. Negative results must be combined with clinical observations, patient history, and epidemiological information. The expected result is Negative.  Fact Sheet for Patients: SugarRoll.be  Fact Sheet for Healthcare Providers: https://www.woods-mathews.com/  This test is not yet approved or cleared by the [place] FDA and  has been authorized for detection and/or diagnosis of SARS-CoV-2 by FDA under an Emergency Use Authorization (EUA). This EUA will remain  in effect (meaning this test can be used) for the  duration of the COVID-19 declaration under Se ction 564(b)(1) of the Act, 21 U.S.C. section 360bbb-3(b)(1), unless the authorization is terminated or revoked sooner.  Performed at [facility], [place] [address] [place], [place] [address]          Radiology Studies: No results found.      Scheduled Meds: . acetaminophen  1,000 mg Oral QHS  . enoxaparin (LOVENOX) injection  120 mg Subcutaneous Q24H  . ferrous sulfate  325 mg Oral Daily  . hydrochlorothiazide  12.5 mg Oral Daily  . hydrocortisone   Topical BID  . hydrOXYzine  10 mg Oral TID  . insulin aspart  0-20 Units Subcutaneous TID WC  . insulin aspart  0-5 Units Subcutaneous QHS  . metoprolol tartrate  12.5 mg Oral BID  . polyethylene glycol  17 g Oral Daily  . potassium chloride  20 mEq Oral Daily  . senna-docusate  1 tablet Oral BID   Continuous Infusions: . sodium chloride 250 mL (10/15/20 2301)     LOS: 2 days   Alma Friendly, MD [facility]  10/19/2020, 2:54 PM

## 2020-10-19 NOTE — Plan of Care (Signed)
  Problem: Education: Goal: Knowledge of General Education information will improve Description: Including pain rating scale, medication(s)/side effects and non-pharmacologic comfort measures Outcome: Progressing   Problem: Health Behavior/Discharge Planning: Goal: Ability to manage health-related needs will improve Outcome: Progressing   Problem: Pain Managment: Goal: General experience of comfort will improve Outcome: Progressing   

## 2020-10-20 DIAGNOSIS — I10 Essential (primary) hypertension: Secondary | ICD-10-CM | POA: Diagnosis not present

## 2020-10-20 DIAGNOSIS — I87399 Chronic venous hypertension (idiopathic) with other complications of unspecified lower extremity: Secondary | ICD-10-CM | POA: Diagnosis not present

## 2020-10-20 DIAGNOSIS — T148XXA Other injury of unspecified body region, initial encounter: Secondary | ICD-10-CM | POA: Diagnosis not present

## 2020-10-20 DIAGNOSIS — I890 Lymphedema, not elsewhere classified: Secondary | ICD-10-CM | POA: Diagnosis not present

## 2020-10-20 LAB — GLUCOSE, CAPILLARY
Glucose-Capillary: 151 mg/dL — ABNORMAL HIGH (ref 70–99)
Glucose-Capillary: 173 mg/dL — ABNORMAL HIGH (ref 70–99)
Glucose-Capillary: 177 mg/dL — ABNORMAL HIGH (ref 70–99)
Glucose-Capillary: 149 mg/dL — ABNORMAL HIGH (ref 70–99)

## 2020-10-20 NOTE — Progress Notes (Signed)
PROGRESS NOTE    [name]  WUJ:[id number] DOB: [birth year] DOA: 10/10/2020 PCP: Fanny Bien, MD   Brief Narrative:  HPI from Dr Rodena Piety 55 year old female morbidly obese type II diabetic with chronic lymphedema chronic nonhealing ulcers on both legs and calves and left upper thigh followed by wound clinic Dr. Asa Saunas was also being followed by home health nurses 3 times a week which has been terminated few weeks ago by insurance her insurance company.  Now her wounds have become much larger with increased drainage and pain.  She is unable to change the dressings by herself due to the location of the wound and morbid obesity.  She does not walk she only moves around in the house in the rolling chair 1-2 times per day.  She was not able to get to the restroom so she has been urinating in the bed which has made the wounds even much worse.  She lives alone.  She describes her pain as 10 out of 10 and has been receiving IV narcotics.  Chronic wounds and multiple areas of skin breakdown.  When patient came to the ER she had very old dressings still in place which were malodorous and had bloody drainage with chronic skin changes in the  periwound area.  She received Ancef in the ER, admitted for further management.    Assessment & Plan:   Principal Problem:   Wound infection Active Problems:   Hyperkalemia   Chronic peripheral venous hypertension with lower extremity complication   Lymphedema   Obesity   Type 2 diabetes mellitus (HCC)   HTN (hypertension)   Cellulitis   Chronic bilateral lower extremity wounds  Chronic Lymphedema Noted increased pain, foul-smelling discharge on admission, now improved, with wound 100% pink and clean Discontinued vancomycin, cefepime Appreciate wound care input Ok to use home lymphedema pump  Type 2 diabetes mellitus Hemoglobin A1c 6.5 SSI, Accu-Cheks, hypoglycemic protocol  Not on any medications at home, refuses to take  metformin  Essential hypertension Started on metoprolol 12.5 twice daily Start HCTZ 12.5 mg daily, d/c lasix  Mildly elevated d-dimer Noted mild tachycardia, HR in low 100s D-dimer mildly elevated Pt too heavy for CT scan, unable to do CTA chest, although current low suspicion for PE, but at high risk Continue to monitor  Morbid obesity BMI 73 Lifestyle modification advised  Disposition Patient lives alone and is unable to care for herself.  She is unable to change the dressings on the wound because of location and morbid obesity and pain.  She does not ambulate.  She moves around in her Rollator couple of times during the day.  She is unable to cook for herself as she is unable to stand.  She will need placement.  She is an unsafe discharge back home.  She does not have any family members that can assist her.      Pressure Injury 10/11/20 Thigh Left;Posterior (Active)  10/11/20 0041  Location: Thigh  Location Orientation: Left;Posterior  Staging:   Wound Description (Comments):   Present on Admission:      Pressure Injury 10/11/20 Tibial Left;Posterior;Proximal (Active)  10/11/20 0041  Location: Tibial  Location Orientation: Left;Posterior;Proximal  Staging:   Wound Description (Comments):   Present on Admission:      Estimated body mass index is 73.89 kg/m as calculated from the following:   Height as of this encounter: 5\' 10"  (1.778 m).   Weight as of this encounter: 233.6 kg.  DVT prophylaxis: Lovenox  code  Status: Full code Family Communication: None at bedside  disposition Plan:  Status is: Inpatient   Dispo: The patient is from: Home              Anticipated d/c is to: SNF              Patient currently is medically stable to d/c.   Difficult to place patient yes due to bariatric bed    Consultants: None  Procedures: None Antimicrobials: None  Subjective: Patient denies any new complaints.   Objective: Vitals:   10/19/20 1500 10/19/20 2222  10/20/20 0530 10/20/20 1329  BP: (!) 147/76 128/62 (!) 109/50 138/75  Pulse: 100 97 86 (!) 101  Resp: 18 17 17 18   Temp: (!) 97.5 F (36.4 C) 99 F (37.2 C) 98.9 F (37.2 C) 99.1 F (37.3 C)  TempSrc: Oral Oral Oral Oral  SpO2: 94% 92% (!) 86% 92%  Weight:      Height:        Intake/Output Summary (Last 24 hours) at 10/20/2020 1607 Last data filed at 10/20/2020 1400 Gross per 24 hour  Intake 360 ml  Output 1100 ml  Net -740 ml   Filed Weights   10/10/20 1954  Weight: (!) 233.6 kg    Examination:  General: NAD, morbidly obese  Cardiovascular: S1, S2 present  Respiratory:  Diminished breath sounds bilaterally  Abdomen: Soft, obese, nontender, nondistended, bowel sounds present  Musculoskeletal: 2+ bilateral pedal edema noted, lymphedema  Skin:  Noted bilateral chronic venous stasis changes, multiple wounds  Psychiatry: Normal mood   Data Reviewed: I have personally reviewed following labs and imaging studies  CBC: Recent Labs  Lab 10/16/20 0950 10/19/20 0548  WBC 9.5 8.5  NEUTROABS 6.5 5.4  HGB 13.3 12.6  HCT 44.8 42.9  MCV 99.6 99.8  PLT 273 557   Basic Metabolic Panel: Recent Labs  Lab 10/14/20 1348 10/16/20 0950 10/19/20 0548  NA 141 133* 135  K 4.3 4.5 3.8  CL 103 98 99  CO2 28 24 26   GLUCOSE 165* 184* 170*  BUN 15 14 16   CREATININE 0.72 0.83 0.85  CALCIUM 8.9 8.9 9.1   GFR: Estimated Creatinine Clearance: 164.4 mL/min (by C-G formula based on SCr of 0.85 mg/dL). Liver Function Tests: No results for input(s): AST, ALT, ALKPHOS, BILITOT, PROT, ALBUMIN in the last 168 hours. No results for input(s): LIPASE, AMYLASE in the last 168 hours. No results for input(s): AMMONIA in the last 168 hours. Coagulation Profile: No results for input(s): INR, PROTIME in the last 168 hours. Cardiac Enzymes: No results for input(s): CKTOTAL, CKMB, CKMBINDEX, TROPONINI in the last 168 hours. BNP (last 3 results) No results for input(s): PROBNP in the last  8760 hours. HbA1C: No results for input(s): HGBA1C in the last 72 hours. CBG: Recent Labs  Lab 10/19/20 1204 10/19/20 1717 10/19/20 2115 10/20/20 0723 10/20/20 1253  GLUCAP 176* 138* 149* 151* 173*   Lipid Profile: No results for input(s): CHOL, HDL, LDLCALC, TRIG, CHOLHDL, LDLDIRECT in the last 72 hours. Thyroid Function Tests: No results for input(s): TSH, T4TOTAL, FREET4, T3FREE, THYROIDAB in the last 72 hours. Anemia Panel: No results for input(s): VITAMINB12, FOLATE, FERRITIN, TIBC, IRON, RETICCTPCT in the last 72 hours. Sepsis Labs: No results for input(s): PROCALCITON, LATICACIDVEN in the last 168 hours.  Recent Results (from the past 240 hour(s))  SARS CORONAVIRUS 2 (TAT 6-24 HRS) Nasopharyngeal Nasopharyngeal Swab     Status: None   Collection Time: 10/10/20  5:00 PM  Specimen: Nasopharyngeal Swab  Result Value Ref Range Status   SARS Coronavirus 2 NEGATIVE NEGATIVE Final    Comment: (NOTE) SARS-CoV-2 target nucleic acids are NOT DETECTED.  The SARS-CoV-2 RNA is generally detectable in upper and lower respiratory specimens during the acute phase of infection. Negative results do not preclude SARS-CoV-2 infection, do not rule out co-infections with other pathogens, and should not be used as the sole basis for treatment or other patient management decisions. Negative results must be combined with clinical observations, patient history, and epidemiological information. The expected result is Negative.  Fact Sheet for Patients: SugarRoll.be  Fact Sheet for Healthcare Providers: https://www.woods-mathews.com/  This test is not yet approved or cleared by the [place] FDA and  has been authorized for detection and/or diagnosis of SARS-CoV-2 by FDA under an Emergency Use Authorization (EUA). This EUA will remain  in effect (meaning this test can be used) for the duration of the COVID-19 declaration under Se ction  564(b)(1) of the Act, 21 U.S.C. section 360bbb-3(b)(1), unless the authorization is terminated or revoked sooner.  Performed at [facility], [place] [address], [place], [place] [address]          Radiology Studies: No results found.      Scheduled Meds: . acetaminophen  1,000 mg Oral QHS  . enoxaparin (LOVENOX) injection  120 mg Subcutaneous Q24H  . ferrous sulfate  325 mg Oral Daily  . hydrochlorothiazide  12.5 mg Oral Daily  . hydrocortisone   Topical BID  . hydrOXYzine  10 mg Oral TID  . insulin aspart  0-20 Units Subcutaneous TID WC  . insulin aspart  0-5 Units Subcutaneous QHS  . metoprolol tartrate  12.5 mg Oral BID  . polyethylene glycol  17 g Oral Daily  . potassium chloride  20 mEq Oral Daily  . senna-docusate  1 tablet Oral BID   Continuous Infusions: . sodium chloride 250 mL (10/15/20 2301)     LOS: 3 days   Alma Friendly, MD [facility]  10/20/2020, 4:07 PM

## 2020-10-20 NOTE — Plan of Care (Signed)
  Problem: Education: Goal: Knowledge of General Education information will improve Description: Including pain rating scale, medication(s)/side effects and non-pharmacologic comfort measures Outcome: Progressing   Problem: Activity: Goal: Risk for activity intolerance will decrease Outcome: Progressing   Problem: Pain Managment: Goal: General experience of comfort will improve Outcome: Progressing   

## 2020-10-20 NOTE — Plan of Care (Signed)
  Problem: Pain Managment: Goal: General experience of comfort will improve Outcome: Progressing   

## 2020-10-21 DIAGNOSIS — T148XXA Other injury of unspecified body region, initial encounter: Secondary | ICD-10-CM | POA: Diagnosis not present

## 2020-10-21 DIAGNOSIS — I87399 Chronic venous hypertension (idiopathic) with other complications of unspecified lower extremity: Secondary | ICD-10-CM | POA: Diagnosis not present

## 2020-10-21 DIAGNOSIS — I10 Essential (primary) hypertension: Secondary | ICD-10-CM | POA: Diagnosis not present

## 2020-10-21 DIAGNOSIS — I890 Lymphedema, not elsewhere classified: Secondary | ICD-10-CM | POA: Diagnosis not present

## 2020-10-21 LAB — GLUCOSE, CAPILLARY
Glucose-Capillary: 174 mg/dL — ABNORMAL HIGH (ref 70–99)
Glucose-Capillary: 182 mg/dL — ABNORMAL HIGH (ref 70–99)
Glucose-Capillary: 132 mg/dL — ABNORMAL HIGH (ref 70–99)
Glucose-Capillary: 157 mg/dL — ABNORMAL HIGH (ref 70–99)

## 2020-10-21 MED ORDER — CYCLOBENZAPRINE HCL 5 MG PO TABS
7.5000 mg | ORAL_TABLET | Freq: Two times a day (BID) | ORAL | Status: DC | PRN
  Administered 2020-10-21 – 2020-10-23 (×4): 7.5 mg via ORAL
  Filled 2020-10-21 (×4): qty 2

## 2020-10-21 NOTE — Progress Notes (Signed)
Physical Therapy Treatment Patient Details Name: [name] MRN: [id number] DOB: April 07, 1966 Today's Date: 10/21/2020    History of Present Illness Pt admitted from home with multiple LE wounds, and FTT and unable to perform basic self care.  Pt reports she stayed in lift chair getting up only twice a day for BM or getting food.  Pt utilizing Rollator around home but pivoted to it from lift chair and wheeled around in seated position.  Pt states she put pads and diapers in chair so she did not have to get up to urinate.  Pt with hx of DM and lymphadema (has been unable to use her pressure sleeves so lymphadema has significantly worsened since last December).    PT Comments    Pt moving UEs more today during session and assisted with scooting up in bed ( in trendelenburg position) with assisting with UEs on bedrails today. Worked LEs AAROM first before tilt to continue to encourage movement at hip AB/Adduction and slight knee flexion extension. Very little movement, however pt was putting forth effort and felt muscles being activated with more movement today, especially on R LE.    Increased Tilt to 40 degrees today, this was a lot more weight bearing up to 86kg which was more painful on R LE , so after 5 minutes at 40 degrees , had to come back down to 32 degrees for remaining 5 minutes, for total of 10 minutes today. However in tilt position worked on balloon tap, and UE theraband exercises as well as B LE quad isometrics.    Follow Up Recommendations  SNF     Equipment Recommendations  None recommended by PT    Recommendations for Other Services OT consult     Precautions / Restrictions Precautions Precautions: Fall Precaution Comments: LE wounds    Mobility  Bed Mobility                 Start Time: 1830 Angle: 40 degrees Total Minutes in Angle: 10 minutes (5 mins at 40 and 5 mins at 32)  Transfers                    Ambulation/Gait                  Stairs             Wheelchair Mobility    Modified Rankin (Stroke Patients Only)       Balance                                            Cognition Arousal/Alertness: Awake/alert Behavior During Therapy: WFL for tasks assessed/performed Overall Cognitive Status: Within Functional Limits for tasks assessed                                        Exercises Other Exercises Other Exercises: while in tilt position pt was able to hold her head upright independently for longer period of time, also worked with R / L quad extension exercises ( 10x each) Other Exercises: while in tilt worked on UE exercises : orange TB : B UE 10x each: bicep curls, tricep extenstions, balloon tap for 4 minutes in tilt position. Other Exercises: tried to do gentle hip AAROM for hip AB or  AD duction, 10 times each LE. Very little movement but very slight improvement and positioning of LE in extension for tilting today.    General Comments        Pertinent Vitals/Pain Pain Assessment: 0-10 Pain Score: 6  Faces Pain Scale: Hurts even more Pain Location: hurts in her back and LEs mostly with any bed movement or position change. L knee mostly the painful area today and back once we were in tilt. Flexiril was given by nurse prior to session which seemed to help Pain Descriptors / Indicators: Grimacing;Sharp Pain Intervention(s): Monitored during session;Heat applied;Repositioned    Home Living                      Prior Function            PT Goals (current goals can now be found in the care plan section) Acute Rehab PT Goals Patient Stated Goal: Regain health and IND to the point she can function at home, lose wt, be able to walk PT Goal Formulation: With patient Time For Goal Achievement: 10/24/20 Potential to Achieve Goals: Fair    Frequency    Min 2X/week      PT Plan Current plan remains appropriate    Co-evaluation               AM-PAC PT "6 Clicks" Mobility   Outcome Measure  Help needed turning from your back to your side while in a flat bed without using bedrails?: Total Help needed moving from lying on your back to sitting on the side of a flat bed without using bedrails?: Total Help needed moving to and from a bed to a chair (including a wheelchair)?: Total Help needed standing up from a chair using your arms (e.g., wheelchair or bedside chair)?: Total Help needed to walk in hospital room?: Total Help needed climbing 3-5 steps with a railing? : Total 6 Click Score: 6    End of Session Equipment Utilized During Treatment:  (sky lift and Kreg TILT bed) Activity Tolerance: Patient tolerated treatment well;Patient limited by pain Patient left: in bed Nurse Communication: Mobility status;Need for lift equipment PT Visit Diagnosis: Muscle weakness (generalized) (M62.81);Difficulty in walking, not elsewhere classified (R26.2);Pain;Adult, failure to thrive (R62.7) Pain - part of body: Knee;Leg     Time: 6962-9528 PT Time Calculation (min) (ACUTE ONLY): 41 min  Charges:  $Therapeutic Exercise: 8-22 mins $Therapeutic Activity: 8-22 mins                     Kyuss Hale, PT, MPT Acute Rehabilitation Services Office: [phone] Pager: [phone] 10/21/2020    [name] 10/21/2020, 7:33 PM

## 2020-10-21 NOTE — Progress Notes (Signed)
PROGRESS NOTE    [name]  BPZ:[id number] DOB: 11/27/1965 DOA: 10/10/2020 PCP: Fanny Bien, MD   Brief Narrative:  HPI from Dr Rodena Piety 55 year old female morbidly obese type II diabetic with chronic lymphedema chronic nonhealing ulcers on both legs and calves and left upper thigh followed by wound clinic Dr. Asa Saunas was also being followed by home health nurses 3 times a week which has been terminated few weeks ago by insurance her insurance company.  Now her wounds have become much larger with increased drainage and pain.  She is unable to change the dressings by herself due to the location of the wound and morbid obesity.  She does not walk she only moves around in the house in the rolling chair 1-2 times per day.  She was not able to get to the restroom so she has been urinating in the bed which has made the wounds even much worse.  She lives alone.  She describes her pain as 10 out of 10 and has been receiving IV narcotics.  Chronic wounds and multiple areas of skin breakdown.  When patient came to the ER she had very old dressings still in place which were malodorous and had bloody drainage with chronic skin changes in the  periwound area.  She received Ancef in the ER, admitted for further management.    Assessment & Plan:   Principal Problem:   Wound infection Active Problems:   Hyperkalemia   Chronic peripheral venous hypertension with lower extremity complication   Lymphedema   Obesity   Type 2 diabetes mellitus (HCC)   HTN (hypertension)   Cellulitis   Chronic bilateral lower extremity wounds  Chronic Lymphedema Noted increased pain, foul-smelling discharge on admission, now improved, with wound 100% pink and clean Discontinued vancomycin, cefepime Appreciate wound care input Ok to use home lymphedema pump  Type 2 diabetes mellitus Hemoglobin A1c 6.5 SSI, Accu-Cheks, hypoglycemic protocol  Not on any medications at home, refuses to take  metformin  Essential hypertension Started on metoprolol 12.5 twice daily Start HCTZ 12.5 mg daily, d/c lasix  Mildly elevated d-dimer Noted mild tachycardia, HR in low 100s D-dimer mildly elevated Pt too heavy for CT scan, unable to do CTA chest, although current low suspicion for PE, but at high risk Continue to monitor  Morbid obesity BMI 73 Lifestyle modification advised  Disposition Patient lives alone and is unable to care for herself.  She is unable to change the dressings on the wound because of location and morbid obesity and pain.  She does not ambulate.  She moves around in her Rollator couple of times during the day.  She is unable to cook for herself as she is unable to stand.  She will need placement.  She is an unsafe discharge back home.  She does not have any family members that can assist her.      Pressure Injury 10/11/20 Thigh Left;Posterior (Active)  10/11/20 0041  Location: Thigh  Location Orientation: Left;Posterior  Staging:   Wound Description (Comments):   Present on Admission:      Pressure Injury 10/11/20 Tibial Left;Posterior;Proximal (Active)  10/11/20 0041  Location: Tibial  Location Orientation: Left;Posterior;Proximal  Staging:   Wound Description (Comments):   Present on Admission:      Estimated body mass index is 73.89 kg/m as calculated from the following:   Height as of this encounter: 5\' 10"  (1.778 m).   Weight as of this encounter: 233.6 kg.  DVT prophylaxis: Lovenox  code  Status: Full code Family Communication: None at bedside  disposition Plan:  Status is: Inpatient   Dispo: The patient is from: Home              Anticipated d/c is to: SNF              Patient currently is medically stable to d/c.   Difficult to place patient yes due to bariatric bed    Consultants: None  Procedures: None Antimicrobials: None  Subjective: Patient reports feeling generalized pain/muscle spasms all over her body, back, bilateral  lower extremity.  No PT/OT over the weekend, has been in bed.  Denies any chest pain, shortness of breath, nausea/vomiting, fever/chills.   Objective: Vitals:   10/20/20 1329 10/20/20 2102 10/21/20 0537 10/21/20 1519  BP: 138/75 126/72 (!) 153/82 132/80  Pulse: (!) 101 (!) 103 100 98  Resp: 18 20 18 20   Temp: 99.1 F (37.3 C) 98.1 F (36.7 C) 98.1 F (36.7 C) 98 F (36.7 C)  TempSrc: Oral Oral Oral Oral  SpO2: 92% 95% 91% 94%  Weight:      Height:        Intake/Output Summary (Last 24 hours) at 10/21/2020 1703 Last data filed at 10/21/2020 1400 Gross per 24 hour  Intake 480 ml  Output 2200 ml  Net -1720 ml   Filed Weights   10/10/20 1954  Weight: (!) 233.6 kg    Examination:  General: NAD, morbidly obese  Cardiovascular: S1, S2 present  Respiratory:  Diminished breath sounds bilaterally  Abdomen: Soft, obese, nontender, nondistended, bowel sounds present  Musculoskeletal: 2+ bilateral pedal edema noted, lymphedema  Skin:  Noted bilateral chronic venous stasis changes, multiple wounds  Psychiatry: Normal mood   Data Reviewed: I have personally reviewed following labs and imaging studies  CBC: Recent Labs  Lab 10/16/20 0950 10/19/20 0548  WBC 9.5 8.5  NEUTROABS 6.5 5.4  HGB 13.3 12.6  HCT 44.8 42.9  MCV 99.6 99.8  PLT 273 161   Basic Metabolic Panel: Recent Labs  Lab 10/16/20 0950 10/19/20 0548  NA 133* 135  K 4.5 3.8  CL 98 99  CO2 24 26  GLUCOSE 184* 170*  BUN 14 16  CREATININE 0.83 0.85  CALCIUM 8.9 9.1   GFR: Estimated Creatinine Clearance: 164.4 mL/min (by C-G formula based on SCr of 0.85 mg/dL). Liver Function Tests: No results for input(s): AST, ALT, ALKPHOS, BILITOT, PROT, ALBUMIN in the last 168 hours. No results for input(s): LIPASE, AMYLASE in the last 168 hours. No results for input(s): AMMONIA in the last 168 hours. Coagulation Profile: No results for input(s): INR, PROTIME in the last 168 hours. Cardiac Enzymes: No results  for input(s): CKTOTAL, CKMB, CKMBINDEX, TROPONINI in the last 168 hours. BNP (last 3 results) No results for input(s): PROBNP in the last 8760 hours. HbA1C: No results for input(s): HGBA1C in the last 72 hours. CBG: Recent Labs  Lab 10/20/20 1253 10/20/20 1705 10/20/20 2104 10/21/20 0754 10/21/20 1343  GLUCAP 173* 177* 149* 174* 182*   Lipid Profile: No results for input(s): CHOL, HDL, LDLCALC, TRIG, CHOLHDL, LDLDIRECT in the last 72 hours. Thyroid Function Tests: No results for input(s): TSH, T4TOTAL, FREET4, T3FREE, THYROIDAB in the last 72 hours. Anemia Panel: No results for input(s): VITAMINB12, FOLATE, FERRITIN, TIBC, IRON, RETICCTPCT in the last 72 hours. Sepsis Labs: No results for input(s): PROCALCITON, LATICACIDVEN in the last 168 hours.  No results found for this or any previous visit (from the past 240 hour(s)).  Radiology Studies: No results found.      Scheduled Meds: . acetaminophen  1,000 mg Oral QHS  . enoxaparin (LOVENOX) injection  120 mg Subcutaneous Q24H  . ferrous sulfate  325 mg Oral Daily  . hydrochlorothiazide  12.5 mg Oral Daily  . hydrocortisone   Topical BID  . hydrOXYzine  10 mg Oral TID  . insulin aspart  0-20 Units Subcutaneous TID WC  . insulin aspart  0-5 Units Subcutaneous QHS  . metoprolol tartrate  12.5 mg Oral BID  . polyethylene glycol  17 g Oral Daily  . potassium chloride  20 mEq Oral Daily  . senna-docusate  1 tablet Oral BID   Continuous Infusions: . sodium chloride 250 mL (10/15/20 2301)     LOS: 4 days   Alma Friendly, MD [facility]  10/21/2020, 5:03 PM

## 2020-10-21 NOTE — Progress Notes (Signed)
OT Cancellation Note  Patient Details Name: GARA KINCADE MRN: [id number] DOB: 12-Jan-1966   Cancelled Treatment:    Reason Eval/Treat Not Completed: Pain limiting ability to participate: Pt reported muscle spasm as OT entering room and waiting for time when pain meds are available. Offered UE bed level exercise to compensate for LE pain, however pt stated that she could not tolerate any activity at this time. Will continue efforts.   Julien Girt 10/21/2020, 10:22 AM

## 2020-10-21 NOTE — Care Management Important Message (Signed)
Important Message  Patient Details IM Letter given to the Patient. Name: [name] MRN: [id number] Date of Birth: [birth year]   Medicare Important Message Given:  Yes     Kerin Salen 10/21/2020, 12:05 PM

## 2020-10-22 DIAGNOSIS — I10 Essential (primary) hypertension: Secondary | ICD-10-CM | POA: Diagnosis not present

## 2020-10-22 DIAGNOSIS — I890 Lymphedema, not elsewhere classified: Secondary | ICD-10-CM | POA: Diagnosis not present

## 2020-10-22 DIAGNOSIS — I87399 Chronic venous hypertension (idiopathic) with other complications of unspecified lower extremity: Secondary | ICD-10-CM | POA: Diagnosis not present

## 2020-10-22 DIAGNOSIS — T148XXA Other injury of unspecified body region, initial encounter: Secondary | ICD-10-CM | POA: Diagnosis not present

## 2020-10-22 LAB — BASIC METABOLIC PANEL
Anion gap: 13 (ref 5–15)
BUN: 17 mg/dL (ref 6–20)
CO2: 23 mmol/L (ref 22–32)
Calcium: 9.1 mg/dL (ref 8.9–10.3)
Chloride: 98 mmol/L (ref 98–111)
Creatinine, Ser: 0.88 mg/dL (ref 0.44–1.00)
GFR, Estimated: >60 mL/min (ref >60)
Glucose, Bld: 162 mg/dL — ABNORMAL HIGH (ref 70–99)
Potassium: 3.7 mmol/L (ref 3.5–5.1)
Sodium: 134 mmol/L — ABNORMAL LOW (ref 135–145)

## 2020-10-22 LAB — CBC WITH DIFFERENTIAL/PLATELET
Abs Immature Granulocytes: 0.04 10*3/uL (ref 0.00–0.07)
Basophils Absolute: 0 10*3/uL (ref 0.0–0.1)
Basophils Relative: 0 %
Eosinophils Absolute: 0.5 10*3/uL (ref 0.0–0.5)
Eosinophils Relative: 5 %
HCT: 46 % (ref 36.0–46.0)
Hemoglobin: 13.5 g/dL (ref 12.0–15.0)
Immature Granulocytes: 0 %
Lymphocytes Relative: 23 %
Lymphs Abs: 2.1 10*3/uL (ref 0.7–4.0)
MCH: 29 pg (ref 26.0–34.0)
MCHC: 29.3 g/dL — ABNORMAL LOW (ref 30.0–36.0)
MCV: 98.9 fL (ref 80.0–100.0)
Monocytes Absolute: 0.8 10*3/uL (ref 0.1–1.0)
Monocytes Relative: 8 %
Neutro Abs: 6 10*3/uL (ref 1.7–7.7)
Neutrophils Relative %: 64 %
Platelets: 289 10*3/uL (ref 150–400)
RBC: 4.65 MIL/uL (ref 3.87–5.11)
RDW: 13.7 % (ref 11.5–15.5)
WBC: 9.4 10*3/uL (ref 4.0–10.5)
nRBC: 0 % (ref 0.0–0.2)

## 2020-10-22 LAB — GLUCOSE, CAPILLARY
Glucose-Capillary: 174 mg/dL — ABNORMAL HIGH (ref 70–99)
Glucose-Capillary: 179 mg/dL — ABNORMAL HIGH (ref 70–99)
Glucose-Capillary: 165 mg/dL — ABNORMAL HIGH (ref 70–99)
Glucose-Capillary: 152 mg/dL — ABNORMAL HIGH (ref 70–99)

## 2020-10-22 MED ORDER — HYDROCORTISONE 1 % EX CREA
TOPICAL_CREAM | Freq: Two times a day (BID) | CUTANEOUS | Status: DC
  Administered 2020-10-28 – 2020-12-25 (×12): 1 via TOPICAL
  Filled 2020-10-22 (×4): qty 28

## 2020-10-22 NOTE — Progress Notes (Signed)
Physical Therapy Treatment Patient Details Name: [name] MRN: [id number] DOB: 03/26/1966 Today's Date: 10/22/2020    History of Present Illness Pt admitted from home with multiple LE wounds, and FTT and unable to perform basic self care.  Pt reports she stayed in lift chair getting up only twice a day for BM or getting food.  Pt utilizing Rollator around home but pivoted to it from lift chair and wheeled around in seated position.  Pt states she put pads and diapers in chair so she did not have to get up to urinate.  Pt with hx of DM and lymphadema (has been unable to use her pressure sleeves so lymphadema has significantly worsened since last December).    PT Comments    Pt able to tilt to 32 degrees up to  44 degrees tolerating 95kg max WBing ~ 16 minutes total, with LLE being more painful than RLE.  Pt is demonstrating incr movement RLE --slight knee flexion and extension, hip abd/add in supine. Limited by  Weakness and body habitus.  Pt and (per RN per MD) asking about lymphedema pumps,  have spoken with multiple therapists regarding this.  Pt hs not had pumps on in the last 2 mos, thigh wounds are a contraindication to pump garments and given these factors it is felt that pumps WILL NOT fit her currently. Lymphedema wrapping would be beneficial however this is typically done in the out-pt PT setting.  Continue to recommend SNF, pt remains motivated to work with therapy.  Follow Up Recommendations  SNF     Equipment Recommendations  None recommended by PT    Recommendations for Other Services       Precautions / Restrictions Precautions Precautions: Fall Precaution Comments: LE wounds Restrictions Weight Bearing Restrictions: No    Mobility  Bed Mobility                 Start Time: 1125 Angle: 44 degrees (max) Total Minutes in Angle: 16 minutes Patient Response: Cooperative  Transfers                    Ambulation/Gait                  Stairs             Wheelchair Mobility    Modified Rankin (Stroke Patients Only)       Balance                                            Cognition Arousal/Alertness: Awake/alert Behavior During Therapy: WFL for tasks assessed/performed Overall Cognitive Status: Within Functional Limits for tasks assessed                                 General Comments: went on disability in 2019      Exercises General Exercises - Lower Extremity Ankle Circles/Pumps: AROM;Both;10 reps Other Exercises Other Exercises: encouraged quad and glut sets while tilted Other Exercises: cervical flexion/head lift with abd contraction Other Exercises: tried to do gentle hip AAROM for hip AB or AD duction, 10 times RLE    General Comments        Pertinent Vitals/Pain Pain Assessment: Faces Faces Pain Scale: Hurts even more Pain Location: hurts in her back and LEs mostly with any bed  movement or position change. L knee mostly the painful area today and back once we were in tilt. Flexeril was given by nurse prior to session Pain Descriptors / Indicators: Grimacing;Sharp;Spasm Pain Intervention(s): Limited activity within patient's tolerance;Monitored during session;Repositioned    Home Living                      Prior Function            PT Goals (current goals can now be found in the care plan section) Acute Rehab PT Goals Patient Stated Goal: Regain health and IND to the point she can function at home, lose wt, be able to walk PT Goal Formulation: With patient Time For Goal Achievement: 10/24/20 Potential to Achieve Goals: Fair Progress towards PT goals: Progressing toward goals    Frequency    Min 2X/week      PT Plan Current plan remains appropriate    Co-evaluation              AM-PAC PT "6 Clicks" Mobility   Outcome Measure  Help needed turning from your back to your side while in a flat bed without using  bedrails?: Total Help needed moving from lying on your back to sitting on the side of a flat bed without using bedrails?: Total Help needed moving to and from a bed to a chair (including a wheelchair)?: Total Help needed standing up from a chair using your arms (e.g., wheelchair or bedside chair)?: Total Help needed to walk in hospital room?: Total Help needed climbing 3-5 steps with a railing? : Total 6 Click Score: 6    End of Session Equipment Utilized During Treatment: Other (comment) (tilt bed) Activity Tolerance: Patient tolerated treatment well;Patient limited by pain Patient left: in bed;with call bell/phone within reach Nurse Communication: Mobility status;Need for lift equipment PT Visit Diagnosis: Muscle weakness (generalized) (M62.81);Difficulty in walking, not elsewhere classified (R26.2);Pain;Adult, failure to thrive (R62.7)     Time: 8099-8338 PT Time Calculation (min) (ACUTE ONLY): 40 min  Charges:  $Therapeutic Exercise: 8-22 mins $Therapeutic Activity: 23-37 mins                     Baxter Flattery, PT  Acute Rehab Dept ([place]) [phone] Pager [phone]  10/22/2020    [facility] 10/22/2020, 1:51 PM

## 2020-10-22 NOTE — TOC Progression Note (Signed)
Transition of Care [facility]) - Progression Note    Patient Details  Name: [name] MRN: [id number] Date of Birth: 06-Jun-1966  Transition of [facility]) CM/SW Contact  Lennart Pall, LCSW Phone Number: 10/22/2020, 1:16 PM  Clinical Narrative:    Met with pt today to provide update on (unsuccessful) SNF bed search so far.  She is fully aware that I am seeking bariatric bed anywhere in the state at this point.  We discussed primary barriers of current weight (hearing mostly ~ 450 as cut off on what facilities can manage with their equipment) and her inability still to stand - pivot transfer.   Discussed funding for longer term planning needs and pt notes that she does not qualify for Medicaid due to her current income. We also discussed possibility of any family supports that could assist her.  She reports that all family members are living in the [place] area and are working - she does not believe this is an option for her. TOC will continue to work on SNF placement at this point.   Expected Discharge Plan: [place] Barriers to Discharge: Continued Medical Work up  Expected Discharge Plan and Services Expected Discharge Plan: [place] In-house Referral: Clinical Social Work Discharge Planning Services: CM Consult Post Acute Care Choice: [place] Living arrangements for the past 2 months: Apartment                                       Social Determinants of Health (SDOH) Interventions    Readmission Risk Interventions No flowsheet data found.

## 2020-10-22 NOTE — Progress Notes (Addendum)
PROGRESS NOTE    [name]  GYJ:[id number] DOB: 1965/12/20 DOA: 10/10/2020 PCP: Fanny Bien, MD   Brief Narrative:  HPI from Dr Rodena Piety 55 year old female morbidly obese type II diabetic with chronic lymphedema chronic nonhealing ulcers on both legs and calves and left upper thigh followed by wound clinic Dr. Asa Saunas was also being followed by home health nurses 3 times a week which has been terminated few weeks ago by insurance her insurance company.  Now her wounds have become much larger with increased drainage and pain.  She is unable to change the dressings by herself due to the location of the wound and morbid obesity.  She does not walk she only moves around in the house in the rolling chair 1-2 times per day.  She was not able to get to the restroom so she has been urinating in the bed which has made the wounds even much worse.  She lives alone.  She describes her pain as 10 out of 10 and has been receiving IV narcotics.  Chronic wounds and multiple areas of skin breakdown.  When patient came to the ER she had very old dressings still in place which were malodorous and had bloody drainage with chronic skin changes in the  periwound area.  She received Ancef in the ER, admitted for further management.    Assessment & Plan:   Principal Problem:   Wound infection Active Problems:   Hyperkalemia   Chronic peripheral venous hypertension with lower extremity complication   Lymphedema   Obesity   Type 2 diabetes mellitus (HCC)   HTN (hypertension)   Cellulitis   Chronic bilateral lower extremity wounds  Chronic Lymphedema Noted increased pain, foul-smelling discharge on admission, now improved, with wound 100% pink and clean Discontinued vancomycin, cefepime Appreciate wound care input Unable to use home lymphedema pump due to not fitting properly due to increase in size of BLE as well as BLE wound. Pt may benefit from manual wrapping, but no PT in the system is  certified to do that Pain management, with oxycodone, Flexeril PT/OT  Type 2 diabetes mellitus Hemoglobin A1c 6.5 SSI, Accu-Cheks, hypoglycemic protocol  Not on any medications at home, refuses to take metformin  Essential hypertension Started on metoprolol 12.5 twice daily Start HCTZ 12.5 mg daily, d/c lasix  Mildly elevated d-dimer Noted mild tachycardia, HR in low 100s D-dimer mildly elevated Pt too heavy for CT scan, unable to do CTA chest, although current low suspicion for PE, but at high risk Continue to monitor  Morbid obesity BMI 73 Lifestyle modification advised-discussed with patient extensively on 10/22/2020 about options of weight loss such as considering a consultation for bariatric surgery.  Patient completely refuses even a consultation, states that she has some friends that passed away after the surgery and is currently not interested.  Disposition Patient lives alone and is unable to care for herself.  She is unable to change the dressings on the wound because of location and morbid obesity and pain.  She does not ambulate.  She moves around in her Rollator couple of times during the day.  She is unable to cook for herself as she is unable to stand.  She will need placement.  She is an unsafe discharge back home.  She does not have any family members that can assist her.      Pressure Injury 10/11/20 Thigh Left;Posterior (Active)  10/11/20 0041  Location: Thigh  Location Orientation: Left;Posterior  Staging:   Wound Description (  Comments):   Present on Admission:      Pressure Injury 10/11/20 Tibial Left;Posterior;Proximal (Active)  10/11/20 0041  Location: Tibial  Location Orientation: Left;Posterior;Proximal  Staging:   Wound Description (Comments):   Present on Admission:      Estimated body mass index is 73.89 kg/m as calculated from the following:   Height as of this encounter: 5\' 10"  (1.778 m).   Weight as of this encounter: 233.6 kg.  DVT  prophylaxis: Lovenox  code Status: Full code Family Communication: None at bedside  disposition Plan:  Status is: Inpatient   Dispo: The patient is from: Home              Anticipated d/c is to: SNF              Patient currently is medically stable to d/c.   Difficult to place patient yes due to bariatric bed    Consultants: None  Procedures: None Antimicrobials: None  Subjective: Patient still reports muscle spasms worse on her bilateral lower extremities, any chest pain, abdominal pain, shortness of breath, nausea/vomiting, fever/chills.   Objective: Vitals:   10/21/20 1519 10/21/20 2306 10/22/20 0532 10/22/20 1326  BP: 132/80 (!) 149/69 139/87 (!) 151/57  Pulse: 98 (!) 109 (!) 108 71  Resp: 20 18 16 16   Temp: 98 F (36.7 C) 98.4 F (36.9 C) 98.3 F (36.8 C) 97.8 F (36.6 C)  TempSrc: Oral Oral Oral Oral  SpO2: 94% 93% 90% 94%  Weight:      Height:        Intake/Output Summary (Last 24 hours) at 10/22/2020 1337 Last data filed at 10/22/2020 1024 Gross per 24 hour  Intake 600 ml  Output 2000 ml  Net -1400 ml   Filed Weights   10/10/20 1954  Weight: (!) 233.6 kg    Examination:  General: NAD, morbidly obese  Cardiovascular: S1, S2 present  Respiratory:  Diminished breath sounds bilaterally  Abdomen: Soft, obese, nontender, nondistended, bowel sounds present  Musculoskeletal: 2+ bilateral pedal edema noted, lymphedema  Skin:  Noted bilateral chronic venous stasis changes, multiple wounds  Psychiatry: Normal mood   Data Reviewed: I have personally reviewed following labs and imaging studies  CBC: Recent Labs  Lab 10/16/20 0950 10/19/20 0548 10/22/20 0259  WBC 9.5 8.5 9.4  NEUTROABS 6.5 5.4 6.0  HGB 13.3 12.6 13.5  HCT 44.8 42.9 46.0  MCV 99.6 99.8 98.9  PLT 273 291 130   Basic Metabolic Panel: Recent Labs  Lab 10/16/20 0950 10/19/20 0548 10/22/20 0259  NA 133* 135 134*  K 4.5 3.8 3.7  CL 98 99 98  CO2 24 26 23   GLUCOSE 184* 170*  162*  BUN 14 16 17   CREATININE 0.83 0.85 0.88  CALCIUM 8.9 9.1 9.1   GFR: Estimated Creatinine Clearance: 158.8 mL/min (by C-G formula based on SCr of 0.88 mg/dL). Liver Function Tests: No results for input(s): AST, ALT, ALKPHOS, BILITOT, PROT, ALBUMIN in the last 168 hours. No results for input(s): LIPASE, AMYLASE in the last 168 hours. No results for input(s): AMMONIA in the last 168 hours. Coagulation Profile: No results for input(s): INR, PROTIME in the last 168 hours. Cardiac Enzymes: No results for input(s): CKTOTAL, CKMB, CKMBINDEX, TROPONINI in the last 168 hours. BNP (last 3 results) No results for input(s): PROBNP in the last 8760 hours. HbA1C: No results for input(s): HGBA1C in the last 72 hours. CBG: Recent Labs  Lab 10/21/20 1343 10/21/20 1708 10/21/20 2106 10/22/20 0726 10/22/20 1223  GLUCAP 182* 132* 157* 174* 179*   Lipid Profile: No results for input(s): CHOL, HDL, LDLCALC, TRIG, CHOLHDL, LDLDIRECT in the last 72 hours. Thyroid Function Tests: No results for input(s): TSH, T4TOTAL, FREET4, T3FREE, THYROIDAB in the last 72 hours. Anemia Panel: No results for input(s): VITAMINB12, FOLATE, FERRITIN, TIBC, IRON, RETICCTPCT in the last 72 hours. Sepsis Labs: No results for input(s): PROCALCITON, LATICACIDVEN in the last 168 hours.  No results found for this or any previous visit (from the past 240 hour(s)).       Radiology Studies: No results found.      Scheduled Meds: . acetaminophen  1,000 mg Oral QHS  . enoxaparin (LOVENOX) injection  120 mg Subcutaneous Q24H  . ferrous sulfate  325 mg Oral Daily  . hydrochlorothiazide  12.5 mg Oral Daily  . hydrocortisone   Topical BID  . hydrOXYzine  10 mg Oral TID  . insulin aspart  0-20 Units Subcutaneous TID WC  . insulin aspart  0-5 Units Subcutaneous QHS  . metoprolol tartrate  12.5 mg Oral BID  . polyethylene glycol  17 g Oral Daily  . potassium chloride  20 mEq Oral Daily  . senna-docusate  1  tablet Oral BID   Continuous Infusions: . sodium chloride 250 mL (10/15/20 2301)     LOS: 5 days   Alma Friendly, MD [facility]  10/22/2020, 1:37 PM

## 2020-10-23 ENCOUNTER — Inpatient Hospital Stay (HOSPITAL_COMMUNITY): Payer: Medicare PPO

## 2020-10-23 DIAGNOSIS — L089 Local infection of the skin and subcutaneous tissue, unspecified: Secondary | ICD-10-CM | POA: Diagnosis not present

## 2020-10-23 DIAGNOSIS — T148XXA Other injury of unspecified body region, initial encounter: Secondary | ICD-10-CM | POA: Diagnosis not present

## 2020-10-23 DIAGNOSIS — R7989 Other specified abnormal findings of blood chemistry: Secondary | ICD-10-CM | POA: Diagnosis not present

## 2020-10-23 LAB — COMPREHENSIVE METABOLIC PANEL
ALT: 21 U/L (ref 0–44)
AST: 26 U/L (ref 15–41)
Albumin: 3.8 g/dL (ref 3.5–5.0)
Alkaline Phosphatase: 66 U/L (ref 38–126)
Anion gap: 13 (ref 5–15)
BUN: 19 mg/dL (ref 6–20)
CO2: 26 mmol/L (ref 22–32)
Calcium: 9.7 mg/dL (ref 8.9–10.3)
Chloride: 100 mmol/L (ref 98–111)
Creatinine, Ser: 0.91 mg/dL (ref 0.44–1.00)
GFR, Estimated: >60 mL/min (ref >60)
Glucose, Bld: 213 mg/dL — ABNORMAL HIGH (ref 70–99)
Potassium: 3.9 mmol/L (ref 3.5–5.1)
Sodium: 139 mmol/L (ref 135–145)
Total Bilirubin: 0.9 mg/dL (ref 0.3–1.2)
Total Protein: 8.7 g/dL — ABNORMAL HIGH (ref 6.5–8.1)

## 2020-10-23 LAB — CBC WITH DIFFERENTIAL/PLATELET
Abs Immature Granulocytes: 0.05 10*3/uL (ref 0.00–0.07)
Basophils Absolute: 0.1 10*3/uL (ref 0.0–0.1)
Basophils Relative: 1 %
Eosinophils Absolute: 0.3 10*3/uL (ref 0.0–0.5)
Eosinophils Relative: 4 %
HCT: 46.4 % — ABNORMAL HIGH (ref 36.0–46.0)
Hemoglobin: 14.2 g/dL (ref 12.0–15.0)
Immature Granulocytes: 1 %
Lymphocytes Relative: 20 %
Lymphs Abs: 1.6 10*3/uL (ref 0.7–4.0)
MCH: 29.6 pg (ref 26.0–34.0)
MCHC: 30.6 g/dL (ref 30.0–36.0)
MCV: 96.7 fL (ref 80.0–100.0)
Monocytes Absolute: 0.6 10*3/uL (ref 0.1–1.0)
Monocytes Relative: 7 %
Neutro Abs: 5.7 10*3/uL (ref 1.7–7.7)
Neutrophils Relative %: 67 %
Platelets: 327 10*3/uL (ref 150–400)
RBC: 4.8 MIL/uL (ref 3.87–5.11)
RDW: 13.5 % (ref 11.5–15.5)
WBC: 8.3 10*3/uL (ref 4.0–10.5)
nRBC: 0 % (ref 0.0–0.2)

## 2020-10-23 LAB — GLUCOSE, CAPILLARY
Glucose-Capillary: 176 mg/dL — ABNORMAL HIGH (ref 70–99)
Glucose-Capillary: 209 mg/dL — ABNORMAL HIGH (ref 70–99)
Glucose-Capillary: 134 mg/dL — ABNORMAL HIGH (ref 70–99)
Glucose-Capillary: 167 mg/dL — ABNORMAL HIGH (ref 70–99)

## 2020-10-23 LAB — MAGNESIUM: Magnesium: 1.4 mg/dL — ABNORMAL LOW (ref 1.7–2.4)

## 2020-10-23 MED ORDER — METHOCARBAMOL 500 MG PO TABS
500.0000 mg | ORAL_TABLET | Freq: Three times a day (TID) | ORAL | Status: DC | PRN
  Administered 2020-10-23 – 2020-11-20 (×64): 500 mg via ORAL
  Filled 2020-10-23 (×69): qty 1

## 2020-10-23 NOTE — TOC Progression Note (Signed)
Transition of Care [facility]) - Progression Note    Patient Details  Name: [name] MRN: [id number] Date of Birth: February 02, 1966  [facility] CM/SW Contact  Lennart Pall, LCSW Phone Number: 10/23/2020, 8:38 AM  Clinical Narrative:    Have updated pt's information to include new weight of 477 lbs and resending to facilities.   Expected Discharge Plan: [place] Barriers to Discharge: Continued Medical Work up  Expected Discharge Plan and Services Expected Discharge Plan: [place] In-house Referral: Clinical Social Work Discharge Planning Services: CM Consult Post Acute Care Choice: [place] Living arrangements for the past 2 months: Apartment                                       Social Determinants of Health (SDOH) Interventions    Readmission Risk Interventions No flowsheet data found.

## 2020-10-23 NOTE — Progress Notes (Signed)
PT Cancellation Note  Patient Details Name: [name] MRN: [id number] DOB: [birth year]   Cancelled Treatment:    Reason Eval/Treat Not Completed: Medical issues which prohibited therapy, had issues =having BM over period of time. Will check back tomorrow for tilt bed. Patient will ask RN to assist with lymphodema pumps as tolerated.   Victoriano Campion [place] Pager [phone] Office [phone]  10/23/2020, 2:06 PM

## 2020-10-23 NOTE — Progress Notes (Signed)
Bilateral lower extremity venous duplex has been completed. Preliminary results can be found in CV Proc through chart review.   10/23/20 10:00 AM [name] RVT

## 2020-10-23 NOTE — Progress Notes (Signed)
PROGRESS NOTE    [name]  [id number] DOB: 1965-10-09 DOA: 10/10/2020 PCP: Sierra Bien, MD   Chief Complaint  Patient presents with  . Wound Infection    Brief Narrative:  55 year old female morbidly obese type II diabetic with chronic lymphedema chronic nonhealing ulcers on both legs and calves and left upper thigh followed by wound clinic Dr. Asa Saunas was also being followed by home health nurses 3 times [name] week which has been terminated few weeks ago by insurance her insurance company.  Now her wounds have become much larger with increased drainage and pain.  She is unable to change the dressings by herself due to the location of the wound and morbid obesity.  She does not walk she only moves around in the house in the rolling chair 1-2 times per day.  She was not able to get to the restroom so she has been urinating in the bed which has made the wounds even much worse.  She lives alone.  She describes her pain as 10 out of 10 and has been receiving IV narcotics.  Chronic wounds and multiple areas of skin breakdown.  When patient came to the ER she had very old dressings still in place which were malodorous and had bloody drainage with chronic skin changes in the  periwound area.  She received Ancef in the ER, admitted for further management.   Assessment & Plan:   Principal Problem:   Wound infection Active Problems:   Hyperkalemia   Chronic peripheral venous hypertension with lower extremity complication   Lymphedema   Obesity   Type 2 diabetes mellitus (HCC)   HTN (hypertension)   Cellulitis  Chronic bilateral lower extremity wounds  Chronic Lymphedema Noted increased pain, foul-smelling discharge on admission, now improved, with wound 100% pink and clean Discontinued vancomycin, cefepime (was only on abx from 4/21-4/22) Appreciate wound care input Unable to use home lymphedema pump due to not fitting properly due to increase in size of BLE as well as BLE  wound. Pt may benefit from manual wrapping, but no PT in the system is certified to do that Pain management, with oxycodone, Flexeril PT/OT  Type 2 diabetes mellitus Hemoglobin A1c 6.5 SSI, Accu-Cheks, hypoglycemic protocol  Not on any medications at home, refuses to take metformin  Essential hypertension Started on metoprolol 12.5 twice daily Start HCTZ 12.5 mg daily, d/c lasix  Mildly elevated d-dimer Noted mild tachycardia, HR in low 100s D-dimer mildly elevated LE [place] with suboptimal study Consider echo? Pt too heavy for CT scan, unable to do CTA chest, although current low suspicion for PE (lack of CP, SOB, hypoxia), but at high risk Continue to monitor  Morbid obesity BMI 73 Lifestyle modification advised-discussed with patient extensively on 10/22/2020 about options of weight loss such as considering [name] consultation for bariatric surgery.  Patient completely refuses even [name] consultation, states that she has some friends that passed away after the surgery and is currently not interested.  Disposition Patient lives alone and is unable to care for herself.  She is unable to change the dressings on the wound because of location and morbid obesity and pain.  She does not ambulate.  She moves around in her Rollator couple of times during the day.  She is unable to cook for herself as she is unable to stand.  She will need placement.  She is an unsafe discharge back home.  She does not have any family members that can assist her.  Pressure  injuries Pressure Injury 10/11/20 Thigh Left;Posterior (Active)  10/11/20 0041  Location: Thigh  Location Orientation: Left;Posterior  Staging:   Wound Description (Comments):   Present on Admission:      Pressure Injury 10/11/20 Tibial Left;Posterior;Proximal (Active)  10/11/20 0041  Location: Tibial  Location Orientation: Left;Posterior;Proximal  Staging:   Wound Description (Comments):   Present on Admission:     DVT prophylaxis:  lovenox Code Status: full  Family Communication: none Disposition:   Status is: Inpatient  Remains inpatient appropriate because:Inpatient level of care appropriate due to severity of illness   Dispo: The patient is from: Home              Anticipated d/c is to: SNF              Patient currently is not medically stable to d/c.   Difficult to place patient Yes       Consultants:   none  Procedures: LE [place] Summary:  RIGHT:  - Suboptimal study due to patient body habitus. Unable to compress  visualized veins.    LEFT:  - Suboptimal study due to patient body habitus. Unable to compress  visualized veins.     *See table(s) above for measurements and observations.  Antimicrobials: Anti-infectives (From admission, onward)   Start     Dose/Rate Route Frequency Ordered Stop   10/11/20 1000  vancomycin (VANCOREADY) IVPB 2000 mg/400 mL  Status:  Discontinued        2,000 mg 200 mL/hr over 120 Minutes Intravenous Every 12 hours 10/10/20 2047 10/11/20 1140   10/11/20 0100  ceFEPIme (MAXIPIME) 2 g in sodium chloride 0.9 % 100 mL IVPB  Status:  Discontinued        2 g 200 mL/hr over 30 Minutes Intravenous Every 8 hours 10/10/20 2001 10/11/20 1140   10/10/20 2100  vancomycin (VANCOCIN) 2,500 mg in sodium chloride 0.9 % 500 mL IVPB        2,500 mg 250 mL/hr over 120 Minutes Intravenous  Once 10/10/20 2001 10/11/20 0135   10/10/20 1545  ceFAZolin (ANCEF) IVPB 2g/100 mL premix        2 g 200 mL/hr over 30 Minutes Intravenous  Once 10/10/20 1530 10/10/20 1723         Subjective: No complaints today  Objective: Vitals:   10/22/20 1531 10/22/20 2137 10/23/20 0539 10/23/20 1420  BP:  106/70 (!) 164/89 137/77  Pulse:  (!) 109 (!) 105 (!) 101  Resp:  18 20 18   Temp:  98.4 F (36.9 C) 98 F (36.7 C) 98 F (36.7 C)  TempSrc:  Oral Oral Oral  SpO2:  95% 91% 95%  Weight: (!) 216.5 kg     Height:        Intake/Output Summary (Last 24 hours) at 10/23/2020 1836 Last data  filed at 10/23/2020 1500 Gross per 24 hour  Intake 660 ml  Output 950 ml  Net -290 ml   Filed Weights   10/10/20 1954 10/22/20 1531  Weight: (!) 233.6 kg (!) 216.5 kg    Examination:  General exam: Appears calm and comfortable - obese Respiratory system: Clear to auscultation. Respiratory effort normal. Cardiovascular system: S1 & S2 heard, RRR Gastrointestinal system: Abdomen is nondistended, soft and nontender. Central nervous system: Alert and oriented. No focal neurological deficits. Extremities: bilateral LE edema, dressing intact  Psychiatry: Judgement and insight appear normal. Mood & affect appropriate.     Data Reviewed: I have personally reviewed following labs and imaging studies  CBC: Recent Labs  Lab 10/19/20 0548 10/22/20 0259 10/23/20 1308  WBC 8.5 9.4 8.3  NEUTROABS 5.4 6.0 5.7  HGB 12.6 13.5 14.2  HCT 42.9 46.0 46.4*  MCV 99.8 98.9 96.7  PLT 291 289 [id number]    Basic Metabolic Panel: Recent Labs  Lab 10/19/20 0548 10/22/20 0259 10/23/20 1308  NA 135 134* 139  K 3.8 3.7 3.9  CL 99 98 100  CO2 26 23 26   GLUCOSE 170* 162* 213*  BUN 16 17 19   CREATININE 0.85 0.88 0.91  CALCIUM 9.1 9.1 9.7  MG  --   --  1.4*    GFR: Estimated Creatinine Clearance: 145.8 mL/min (by C-G formula based on SCr of 0.91 mg/dL).  Liver Function Tests: Recent Labs  Lab 10/23/20 1308  AST 26  ALT 21  ALKPHOS 66  BILITOT 0.9  PROT 8.7*  ALBUMIN 3.8    CBG: Recent Labs  Lab 10/22/20 1709 10/22/20 2130 10/23/20 0918 10/23/20 1145 10/23/20 1646  GLUCAP 165* 152* 176* 209* 134*     No results found for this or any previous visit (from the past 240 hour(s)).       Radiology Studies: VAS [place] LOWER EXTREMITY VENOUS (DVT)  Result Date: 10/23/2020  Lower Venous DVT Study Patient Name:  GEORDYN STUM  Date of Exam:   10/23/2020 Medical Rec #: [id number]           Accession #:    [id number] Date of Birth: [birth year]          Patient Gender: F Patient Age:    54Y Exam Location:  [facility] Procedure:      VAS [place] LOWER EXTREMITY VENOUS (DVT) Referring Phys: [id number] Skyeler Scalese CALDWELL POWELL JR --------------------------------------------------------------------------------  Indications: Elevated Ddimer. Other Indications: 5'10" 477lbs BMI: 68.48 kg/m. Risk Factors: None identified. Limitations: Body habitus, poor ultrasound/tissue interface, bandages, open wound and Skin induration, patient positioning, patient immobility, patient pain tolerance. Comparison Study: No prior studies. Performing Technologist: Oliver Hum RVT  Examination Guidelines: Mikaiah Stoffer complete evaluation includes B-mode imaging, spectral Doppler, color Doppler, and power Doppler as needed of all accessible portions of each vessel. Bilateral testing is considered an integral part of Avenell Sellers complete examination. Limited examinations for reoccurring indications may be performed as noted. The reflux portion of the exam is performed with the patient in reverse Trendelenburg.  +---------+---------------+---------+-----------+----------+-------------------+ RIGHT    CompressibilityPhasicitySpontaneityPropertiesThrombus Aging      +---------+---------------+---------+-----------+----------+-------------------+ CFV                     Yes      Yes                                      +---------+---------------+---------+-----------+----------+-------------------+ SFJ                                                   Not well visualized +---------+---------------+---------+-----------+----------+-------------------+ FV Prox                 Yes      Yes                                      +---------+---------------+---------+-----------+----------+-------------------+  FV Mid                                                Not well visualized +---------+---------------+---------+-----------+----------+-------------------+ FV Distal                                             Not  well visualized +---------+---------------+---------+-----------+----------+-------------------+ PFV                                                   Not well visualized +---------+---------------+---------+-----------+----------+-------------------+ POP      Full           Yes      Yes                                      +---------+---------------+---------+-----------+----------+-------------------+ PTV                                                   Not well visualized +---------+---------------+---------+-----------+----------+-------------------+ PERO                                                  Not well visualized +---------+---------------+---------+-----------+----------+-------------------+   +---------+---------------+---------+-----------+----------+-------------------+ LEFT     CompressibilityPhasicitySpontaneityPropertiesThrombus Aging      +---------+---------------+---------+-----------+----------+-------------------+ CFV                     Yes      Yes                                      +---------+---------------+---------+-----------+----------+-------------------+ SFJ                                                   Not well visualized +---------+---------------+---------+-----------+----------+-------------------+ FV Prox                 Yes      Yes                                      +---------+---------------+---------+-----------+----------+-------------------+ FV Mid                                                Not well visualized +---------+---------------+---------+-----------+----------+-------------------+ FV Distal  Not well visualized +---------+---------------+---------+-----------+----------+-------------------+ PFV                                                   Not well visualized  +---------+---------------+---------+-----------+----------+-------------------+ POP                                                   Unable to visualize                                                       due to open wounds                                                        and bandages.       +---------+---------------+---------+-----------+----------+-------------------+ PTV                                                   Not well visualized +---------+---------------+---------+-----------+----------+-------------------+ PERO                                                  Not well visualized +---------+---------------+---------+-----------+----------+-------------------+     Summary: RIGHT: - Suboptimal study due to patient body habitus. Unable to compress visualized veins.  LEFT: - Suboptimal study due to patient body habitus. Unable to compress visualized veins.  *See table(s) above for measurements and observations. Electronically signed by Jamelle Haring on 10/23/2020 at 5:09:33 PM.    Final         Scheduled Meds: . acetaminophen  1,000 mg Oral QHS  . enoxaparin (LOVENOX) injection  120 mg Subcutaneous Q24H  . ferrous sulfate  325 mg Oral Daily  . hydrochlorothiazide  12.5 mg Oral Daily  . hydrocortisone cream   Topical BID  . hydrOXYzine  10 mg Oral TID  . insulin aspart  0-20 Units Subcutaneous TID WC  . insulin aspart  0-5 Units Subcutaneous QHS  . metoprolol tartrate  12.5 mg Oral BID  . polyethylene glycol  17 g Oral Daily  . potassium chloride  20 mEq Oral Daily  . senna-docusate  1 tablet Oral BID   Continuous Infusions: . sodium chloride 250 mL (10/15/20 2301)     LOS: 6 days    Time spent: over 30 min    Fayrene Helper, MD [facility]   To contact the attending provider between 7A-7P or the covering provider during after hours 7P-7A, please log into the web site [email or web address] and access using universal [place]  password for that web site. If you do not have the password, please call the hospital operator.  10/23/2020, 6:36  PM    

## 2020-10-24 DIAGNOSIS — T148XXA Other injury of unspecified body region, initial encounter: Secondary | ICD-10-CM | POA: Diagnosis not present

## 2020-10-24 DIAGNOSIS — L089 Local infection of the skin and subcutaneous tissue, unspecified: Secondary | ICD-10-CM | POA: Diagnosis not present

## 2020-10-24 LAB — GLUCOSE, CAPILLARY
Glucose-Capillary: 176 mg/dL — ABNORMAL HIGH (ref 70–99)
Glucose-Capillary: 163 mg/dL — ABNORMAL HIGH (ref 70–99)
Glucose-Capillary: 127 mg/dL — ABNORMAL HIGH (ref 70–99)
Glucose-Capillary: 195 mg/dL — ABNORMAL HIGH (ref 70–99)

## 2020-10-24 MED ORDER — MAGNESIUM OXIDE -MG SUPPLEMENT 400 (240 MG) MG PO TABS
400.0000 mg | ORAL_TABLET | Freq: Every day | ORAL | Status: DC
  Administered 2020-10-24 – 2020-11-04 (×12): 400 mg via ORAL
  Filled 2020-10-24 (×12): qty 1

## 2020-10-24 NOTE — Progress Notes (Signed)
Physical Therapy Treatment Patient Details Name: [name] MRN: [id number] DOB: 08-19-1965 Today's Date: 10/24/2020    History of Present Illness Pt admitted from home with multiple LE wounds, and FTT and unable to perform basic self care.  Pt reports she stayed in lift chair getting up only twice a day for BM or getting food.  Pt utilizing Rollator around home but pivoted to it from lift chair and wheeled around in seated position.  Pt states she put pads and diapers in chair so she did not have to get up to urinate.  Pt with hx of DM and lymphadema (has been unable to use her pressure sleeves so lymphadema has significantly worsened since last December).    PT Comments    Positioned patient fro tilt bed. Patient tolerated 31 degrees x 12 minutes, 44 KG WB.Reports more pain right knee today. Attempted repositioning legs/feet. Patient did have both fee flatter to foot surface. Patient performed  UE strengthening while tilted. Continue PT for mobility, standing tolerance.  Follow Up Recommendations  SNF     Equipment Recommendations  None recommended by PT    Recommendations for Other Services       Precautions / Restrictions Precautions Precautions: Fall Precaution Comments: LE wounds    Mobility  Bed Mobility               General bed mobility comments: assist for repositioning legs in prep to tilt Start Time: 1215 Angle: 31 degrees Total Minutes in Angle: 12 minutes Patient Response: Cooperative  Transfers                    Ambulation/Gait                 Stairs             Wheelchair Mobility    Modified Rankin (Stroke Patients Only)       Balance                                            Cognition                                              Exercises Other Exercises Other Exercises: encouraged quad and glut sets while tilted Other Exercises: in tilt, TB UE exercises x 20,     General Comments        Pertinent Vitals/Pain Faces Pain Scale: Hurts even more Pain Location: right knee more than left Pain Descriptors / Indicators: Grimacing;Sharp;Spasm Pain Intervention(s): Monitored during session;Limited activity within patient's tolerance;Premedicated before session;Heat applied    Home Living                      Prior Function            PT Goals (current goals can now be found in the care plan section) Acute Rehab PT Goals Patient Stated Goal: to tolerate standing longer PT Goal Formulation: With patient Time For Goal Achievement: 11/07/20 Potential to Achieve Goals: Fair    Frequency    Min 2X/week      PT Plan Current plan remains appropriate    Co-evaluation  AM-PAC PT "6 Clicks" Mobility   Outcome Measure  Help needed turning from your back to your side while in a flat bed without using bedrails?: Total Help needed moving from lying on your back to sitting on the side of a flat bed without using bedrails?: Total Help needed moving to and from a bed to a chair (including a wheelchair)?: Total Help needed standing up from a chair using your arms (e.g., wheelchair or bedside chair)?: Total Help needed to walk in hospital room?: Total Help needed climbing 3-5 steps with a railing? : Total 6 Click Score: 6    End of Session Equipment Utilized During Treatment:  (kreg tilt bed) Activity Tolerance: Patient tolerated treatment well;Patient limited by pain Patient left: in bed;with call bell/phone within reach Nurse Communication: Mobility status;Need for lift equipment PT Visit Diagnosis: Muscle weakness (generalized) (M62.81);Difficulty in walking, not elsewhere classified (R26.2);Pain;Adult, failure to thrive (R62.7) Pain - Right/Left: Right Pain - part of body: Knee;Leg     Time: 7893-8101 PT Time Calculation (min) (ACUTE ONLY): 62 min  Charges:  $Therapeutic Exercise: 8-22 mins $Therapeutic Activity:  38-52 mins                     [name] PT Acute Rehabilitation Services Pager [phone] Office [phone]    [name] 10/24/2020, 2:11 PM

## 2020-10-24 NOTE — TOC Progression Note (Signed)
Transition of Care [facility]) - Progression Note    Patient Details  Name: [name] MRN: [id number] Date of Birth: [birth year]  Transition of Care [facility]) CM/SW Contact  Lennart Pall, LCSW Phone Number: 10/24/2020, 3:51 PM  Clinical Narrative:    Continue to pursue SNF (bariatric) bed throughout the state and several out of state locations reached out to as well.  Have updated pt who remains motivated for rehab in SNF facility with eventual return to her home.     Expected Discharge Plan: [place] Barriers to Discharge: Continued Medical Work up  Expected Discharge Plan and Services Expected Discharge Plan: [place] In-house Referral: Clinical Social Work Discharge Planning Services: CM Consult Post Acute Care Choice: [place] Living arrangements for the past 2 months: Apartment                                       Social Determinants of Health (SDOH) Interventions    Readmission Risk Interventions No flowsheet data found.

## 2020-10-24 NOTE — NC FL2 (Signed)
[place] LEVEL OF CARE SCREENING TOOL     IDENTIFICATION  Patient Name: [name] Birthdate: [birth year] Sex: female Admission Date (Current Location): 10/10/2020  [facility] and [place] Number:  Herbalist and Address:  [facility],  [place], [place]      Provider Number: [id number]  Attending Physician Name and Address:  Elodia Florence., *  Relative Name and Phone Number:  Deggie Jonell Cluck [phone]    Current Level of Care: Hospital Recommended Level of Care: [place] Prior Approval Number:    Date Approved/Denied:   PASRR Number: [id number]  Discharge Plan: SNF    Current Diagnoses: Patient Active Problem List   Diagnosis Date Noted  . Cellulitis 10/17/2020  . HTN (hypertension) 10/11/2020  . Wound infection 10/10/2020  . Hyperkalemia 10/10/2020  . Chronic peripheral venous hypertension with lower extremity complication 46/27/0350  . Lymphedema 12/14/2019  . Obesity 12/14/2019  . Type 2 diabetes mellitus ([place]) 12/14/2019  . S/P hysterectomy 08/13/2011    Orientation RESPIRATION BLADDER Height & Weight     Self,Time,Situation,Place  Normal Incontinent Weight: (!) 477 lb 4.7 oz (216.5 kg) Height:  5\' 10"  (177.8 cm)  BEHAVIORAL SYMPTOMS/MOOD NEUROLOGICAL BOWEL NUTRITION STATUS      Continent Diet (carb modified)  AMBULATORY STATUS COMMUNICATION OF NEEDS Skin   Total Care Verbally PU Stage and Appropriate Care   PU Stage 2 Dressing: BID                   Personal Care Assistance Level of Assistance  Bathing,Feeding,Dressing Bathing Assistance: Maximum assistance Feeding assistance: Independent Dressing Assistance: Maximum assistance     Functional Limitations Info  Sight,Speech,Hearing Sight Info: Adequate Hearing Info: Adequate Speech Info: Adequate    SPECIAL CARE FACTORS FREQUENCY  PT (By licensed PT),OT (By licensed OT)     PT Frequency: 5x per  week OT Frequency: 5x per week            Contractures Contractures Info: Not present    Additional Factors Info  Code Status,Allergies Code Status Info: Full code Allergies Info: no none allergies           Current Medications (10/24/2020):  This is the current hospital active medication list Current Facility-Administered Medications  Medication Dose Route Frequency Provider Last Rate Last Admin  . 0.9 %  sodium chloride infusion   Intravenous PRN Georgette Shell, MD 10 mL/hr at 10/15/20 2301 250 mL at 10/15/20 2301  . acetaminophen (TYLENOL) tablet 1,000 mg  1,000 mg Oral QHS Gala Romney L, MD   1,000 mg at 10/23/20 2220  . acetaminophen (TYLENOL) tablet 650 mg  650 mg Oral Q6H PRN Georgette Shell, MD   650 mg at 10/23/20 0941  . enoxaparin (LOVENOX) injection 120 mg  120 mg Subcutaneous Q24H Gala Romney L, MD   120 mg at 10/24/20 1003  . ferrous sulfate tablet 325 mg  325 mg Oral Daily Gala Romney L, MD   325 mg at 10/24/20 1004  . hydrochlorothiazide (MICROZIDE) capsule 12.5 mg  12.5 mg Oral Daily Alma Friendly, MD   12.5 mg at 10/24/20 1004  . hydrocortisone cream 1 %   Topical BID Alma Friendly, MD   Given at 10/23/20 2221  . hydrOXYzine (ATARAX/VISTARIL) tablet 10 mg  10 mg Oral TID Georgette Shell, MD   10 mg at 10/24/20 1004  . insulin aspart (novoLOG) injection 0-20 Units  0-20 Units  Subcutaneous TID WC Elwyn Reach, MD   4 Units at 10/24/20 1400  . insulin aspart (novoLOG) injection 0-5 Units  0-5 Units Subcutaneous QHS Garba, Mohammad L, MD      . iohexol (OMNIPAQUE) 350 MG/ML injection 100 mL  100 mL Intravenous Once PRN Georgette Shell, MD      . magnesium oxide (MAG-OX) tablet 400 mg  400 mg Oral Daily Elodia Florence., MD   400 mg at 10/24/20 1004  . methocarbamol (ROBAXIN) tablet 500 mg  500 mg Oral Q8H PRN Elodia Florence., MD   500 mg at 10/24/20 1004  . metoprolol tartrate (LOPRESSOR) tablet 12.5 mg   12.5 mg Oral BID Georgette Shell, MD   12.5 mg at 10/24/20 1004  . ondansetron (ZOFRAN) tablet 4 mg  4 mg Oral Q6H PRN Elwyn Reach, MD       Or  . ondansetron (ZOFRAN) injection 4 mg  4 mg Intravenous Q6H PRN Gala Romney L, MD      . oxyCODONE (Oxy IR/ROXICODONE) immediate release tablet 10 mg  10 mg Oral Q4H PRN Elwyn Reach, MD   10 mg at 10/24/20 1359  . polyethylene glycol (MIRALAX / GLYCOLAX) packet 17 g  17 g Oral Daily Alma Friendly, MD   17 g at 10/21/20 0843  . potassium chloride SA (KLOR-CON) CR tablet 20 mEq  20 mEq Oral Daily Georgette Shell, MD   20 mEq at 10/24/20 1004  . senna-docusate (Senokot-S) tablet 1 tablet  1 tablet Oral BID Alma Friendly, MD   1 tablet at 10/24/20 1004     Discharge Medications: Please see discharge summary for a list of discharge medications.  Relevant Imaging Results:  Relevant Lab Results:   Additional Information [id number]  Lennart Pall, LCSW

## 2020-10-24 NOTE — Progress Notes (Signed)
Inpatient Rehab Admissions Coordinator:   Per request from [facility], rescreened for candidacy.  Pt does appear to be slowly progressing with mobility tolerance with therapy.  However, I still do not believe that she is a candidate.  Her tolerance for mobility remains very poor, and she still does not have a disposition from CIR, according to documentation.    Shann Medal, PT, DPT Admissions Coordinator [phone] 10/24/20  1:57 PM

## 2020-10-24 NOTE — Care Management Important Message (Signed)
Important Message  Patient Details IM Letter given to the Patient. Name: [name] MRN: [id number] Date of Birth: [birth year]   Medicare Important Message Given:  Yes     Kerin Salen 10/24/2020, 12:15 PM

## 2020-10-24 NOTE — Progress Notes (Signed)
PROGRESS NOTE    [name]  [id number] DOB: January 28, 1966 DOA: 10/10/2020 PCP: Fanny Bien, MD   Chief Complaint  Patient presents with  . Wound Infection    Brief Narrative:  55 year old female morbidly obese type II diabetic with chronic lymphedema chronic nonhealing ulcers on both legs and calves and left upper thigh followed by wound clinic Dr. Asa Saunas was also being followed by home health nurses 3 times [name] week which has been terminated few weeks ago by insurance her insurance company.  Now her wounds have become much larger with increased drainage and pain.  She is unable to change the dressings by herself due to the location of the wound and morbid obesity.  She does not walk she only moves around in the house in the rolling chair 1-2 times per day.  She was not able to get to the restroom so she has been urinating in the bed which has made the wounds even much worse.  She lives alone.  She describes her pain as 10 out of 10 and has been receiving IV narcotics.  Chronic wounds and multiple areas of skin breakdown.  When patient came to the ER she had very old dressings still in place which were malodorous and had bloody drainage with chronic skin changes in the  periwound area.  She received Ancef in the ER, admitted for further management.   Assessment & Plan:   Principal Problem:   Wound infection Active Problems:   Hyperkalemia   Chronic peripheral venous hypertension with lower extremity complication   Lymphedema   Obesity   Type 2 diabetes mellitus (HCC)   HTN (hypertension)   Cellulitis  Chronic bilateral lower extremity wounds  Chronic Lymphedema Noted increased pain, foul-smelling discharge on admission, now improved, with wound 100% pink and clean Discontinued vancomycin, cefepime (was only on abx from 4/21-4/22) Appreciate wound care input Unable to use home lymphedema pump due to not fitting properly due to increase in size of BLE as well as BLE  wound. Pt may benefit from manual wrapping, but no PT in the system is certified to do that Pain management, with oxycodone, Flexeril PT/OT  Type 2 diabetes mellitus Hemoglobin A1c 6.5 SSI, Accu-Cheks, hypoglycemic protocol  Not on any medications at home, refuses to take metformin  Essential hypertension Started on metoprolol 12.5 twice daily Start HCTZ 12.5 mg daily, d/c lasix Relatively stable, follow adjust as needed  Mildly elevated d-dimer Noted mild tachycardia, HR in low 100s D-dimer mildly elevated LE [place] with suboptimal study Consider echo?  Low suspicion at this point, don't think she needs this Pt too heavy for CT scan, unable to do CTA chest, although current low suspicion for PE (lack of CP, SOB, hypoxia), but at high risk Continue to monitor  Morbid obesity BMI 73 Lifestyle modification advised-discussed with patient extensively on 10/22/2020 about options of weight loss such as considering [name] consultation for bariatric surgery.  Patient completely refuses even [name] consultation, states that she has some friends that passed away after the surgery and is currently not interested.  Disposition Patient lives alone and is unable to care for herself.  She is unable to change the dressings on the wound because of location and morbid obesity and pain.  She does not ambulate.  She moves around in her Rollator couple of times during the day.  She is unable to cook for herself as she is unable to stand.  She will need placement.  She is an unsafe  discharge back home.  She does not have any family members that can assist her.  Pressure injuries Pressure Injury 10/11/20 Thigh Left;Posterior (Active)  10/11/20 0041  Location: Thigh  Location Orientation: Left;Posterior  Staging:   Wound Description (Comments):   Present on Admission:      Pressure Injury 10/11/20 Tibial Left;Posterior;Proximal (Active)  10/11/20 0041  Location: Tibial  Location Orientation:  Left;Posterior;Proximal  Staging:   Wound Description (Comments):   Present on Admission:     DVT prophylaxis: lovenox Code Status: full  Family Communication: none Disposition:   Status is: Inpatient  Remains inpatient appropriate because:Inpatient level of care appropriate due to severity of illness   Dispo: The patient is from: Home              Anticipated d/c is to: SNF              Patient currently is not medically stable to d/c.   Difficult to place patient Yes       Consultants:   none  Procedures: LE [place] Summary:  RIGHT:  - Suboptimal study due to patient body habitus. Unable to compress  visualized veins.    LEFT:  - Suboptimal study due to patient body habitus. Unable to compress  visualized veins.     *See table(s) above for measurements and observations.  Antimicrobials: Anti-infectives (From admission, onward)   Start     Dose/Rate Route Frequency Ordered Stop   10/11/20 1000  vancomycin (VANCOREADY) IVPB 2000 mg/400 mL  Status:  Discontinued        2,000 mg 200 mL/hr over 120 Minutes Intravenous Every 12 hours 10/10/20 2047 10/11/20 1140   10/11/20 0100  ceFEPIme (MAXIPIME) 2 g in sodium chloride 0.9 % 100 mL IVPB  Status:  Discontinued        2 g 200 mL/hr over 30 Minutes Intravenous Every 8 hours 10/10/20 2001 10/11/20 1140   10/10/20 2100  vancomycin (VANCOCIN) 2,500 mg in sodium chloride 0.9 % 500 mL IVPB        2,500 mg 250 mL/hr over 120 Minutes Intravenous  Once 10/10/20 2001 10/11/20 0135   10/10/20 1545  ceFAZolin (ANCEF) IVPB 2g/100 mL premix        2 g 200 mL/hr over 30 Minutes Intravenous  Once 10/10/20 1530 10/10/20 1723         Subjective: No new complaints  Objective: Vitals:   10/23/20 1420 10/23/20 2128 10/24/20 0537 10/24/20 1409  BP: 137/77 (!) 145/68 (!) 161/105 (!) 143/63  Pulse: (!) 101 (!) 108 (!) 108 81  Resp: 18 20 18 16   Temp: 98 F (36.7 C) 98.9 F (37.2 C) 98.3 F (36.8 C) 98.4 F (36.9 C)   TempSrc: Oral Oral Oral Oral  SpO2: 95% 91% 91% 94%  Weight:      Height:        Intake/Output Summary (Last 24 hours) at 10/24/2020 1725 Last data filed at 10/24/2020 1100 Gross per 24 hour  Intake 1140 ml  Output 1450 ml  Net -310 ml   Filed Weights   10/10/20 1954 10/22/20 1531  Weight: (!) 233.6 kg (!) 216.5 kg    Examination:  General: No acute distress. Cardiovascular: Heart sounds show Kaydon Creedon regular rate, and rhythm.  Lungs: Clear to auscultation bilaterally  Abdomen: Soft, nontender, nondistended Neurological: Alert and oriented 3. Moves all extremities 4. Cranial nerves II through XII grossly intact. Extremities: Dressing intact to LLE    Data Reviewed: I have personally reviewed  following labs and imaging studies  CBC: Recent Labs  Lab 10/19/20 0548 10/22/20 0259 10/23/20 1308  WBC 8.5 9.4 8.3  NEUTROABS 5.4 6.0 5.7  HGB 12.6 13.5 14.2  HCT 42.9 46.0 46.4*  MCV 99.8 98.9 96.7  PLT 291 289 937    Basic Metabolic Panel: Recent Labs  Lab 10/19/20 0548 10/22/20 0259 10/23/20 1308  NA 135 134* 139  K 3.8 3.7 3.9  CL 99 98 100  CO2 26 23 26   GLUCOSE 170* 162* 213*  BUN 16 17 19   CREATININE 0.85 0.88 0.91  CALCIUM 9.1 9.1 9.7  MG  --   --  1.4*    GFR: Estimated Creatinine Clearance: 145.8 mL/min (by C-G formula based on SCr of 0.91 mg/dL).  Liver Function Tests: Recent Labs  Lab 10/23/20 1308  AST 26  ALT 21  ALKPHOS 66  BILITOT 0.9  PROT 8.7*  ALBUMIN 3.8    CBG: Recent Labs  Lab 10/23/20 1646 10/23/20 2125 10/24/20 0744 10/24/20 1150 10/24/20 1632  GLUCAP 134* 167* 176* 163* 127*     No results found for this or any previous visit (from the past 240 hour(s)).       Radiology Studies: VAS [place] LOWER EXTREMITY VENOUS (DVT)  Result Date: 10/23/2020  Lower Venous DVT Study Patient Name:  [name]  Date of Exam:   10/23/2020 Medical Rec #: [id number]           Accession #:    [id number] Date of Birth: 08/01/1965           Patient Gender: F Patient Age:   54Y Exam Location:  [facility] Procedure:      VAS [place] LOWER EXTREMITY VENOUS (DVT) Referring Phys: [id number] Aspasia Rude CALDWELL POWELL JR --------------------------------------------------------------------------------  Indications: Elevated Ddimer. Other Indications: 5'10" 477lbs BMI: 68.48 kg/m. Risk Factors: None identified. Limitations: Body habitus, poor ultrasound/tissue interface, bandages, open wound and Skin induration, patient positioning, patient immobility, patient pain tolerance. Comparison Study: No prior studies. Performing Technologist: Oliver Hum RVT  Examination Guidelines: Garnet Chatmon complete evaluation includes B-mode imaging, spectral Doppler, color Doppler, and power Doppler as needed of all accessible portions of each vessel. Bilateral testing is considered an integral part of [place] complete examination. Limited examinations for reoccurring indications may be performed as noted. The reflux portion of the exam is performed with the patient in reverse Trendelenburg.  +---------+---------------+---------+-----------+----------+-------------------+ RIGHT    CompressibilityPhasicitySpontaneityPropertiesThrombus Aging      +---------+---------------+---------+-----------+----------+-------------------+ CFV                     Yes      Yes                                      +---------+---------------+---------+-----------+----------+-------------------+ SFJ                                                   Not well visualized +---------+---------------+---------+-----------+----------+-------------------+ FV Prox                 Yes      Yes                                      +---------+---------------+---------+-----------+----------+-------------------+  FV Mid                                                Not well visualized +---------+---------------+---------+-----------+----------+-------------------+ FV Distal                                              Not well visualized +---------+---------------+---------+-----------+----------+-------------------+ PFV                                                   Not well visualized +---------+---------------+---------+-----------+----------+-------------------+ POP      Full           Yes      Yes                                      +---------+---------------+---------+-----------+----------+-------------------+ PTV                                                   Not well visualized +---------+---------------+---------+-----------+----------+-------------------+ PERO                                                  Not well visualized +---------+---------------+---------+-----------+----------+-------------------+   +---------+---------------+---------+-----------+----------+-------------------+ LEFT     CompressibilityPhasicitySpontaneityPropertiesThrombus Aging      +---------+---------------+---------+-----------+----------+-------------------+ CFV                     Yes      Yes                                      +---------+---------------+---------+-----------+----------+-------------------+ SFJ                                                   Not well visualized +---------+---------------+---------+-----------+----------+-------------------+ FV Prox                 Yes      Yes                                      +---------+---------------+---------+-----------+----------+-------------------+ FV Mid                                                Not well visualized +---------+---------------+---------+-----------+----------+-------------------+ FV Distal  Not well visualized +---------+---------------+---------+-----------+----------+-------------------+ PFV                                                   Not well visualized  +---------+---------------+---------+-----------+----------+-------------------+ POP                                                   Unable to visualize                                                       due to open wounds                                                        and bandages.       +---------+---------------+---------+-----------+----------+-------------------+ PTV                                                   Not well visualized +---------+---------------+---------+-----------+----------+-------------------+ PERO                                                  Not well visualized +---------+---------------+---------+-----------+----------+-------------------+     Summary: RIGHT: - Suboptimal study due to patient body habitus. Unable to compress visualized veins.  LEFT: - Suboptimal study due to patient body habitus. Unable to compress visualized veins.  *See table(s) above for measurements and observations. Electronically signed by Jamelle Haring on 10/23/2020 at 5:09:33 PM.    Final         Scheduled Meds: . acetaminophen  1,000 mg Oral QHS  . enoxaparin (LOVENOX) injection  120 mg Subcutaneous Q24H  . ferrous sulfate  325 mg Oral Daily  . hydrochlorothiazide  12.5 mg Oral Daily  . hydrocortisone cream   Topical BID  . hydrOXYzine  10 mg Oral TID  . insulin aspart  0-20 Units Subcutaneous TID WC  . insulin aspart  0-5 Units Subcutaneous QHS  . magnesium oxide  400 mg Oral Daily  . metoprolol tartrate  12.5 mg Oral BID  . polyethylene glycol  17 g Oral Daily  . potassium chloride  20 mEq Oral Daily  . senna-docusate  1 tablet Oral BID   Continuous Infusions: . sodium chloride 250 mL (10/15/20 2301)     LOS: 7 days    Time spent: over 30 min    Fayrene Helper, MD [facility]   To contact the attending provider between 7A-7P or the covering provider during after hours 7P-7A, please log into the web site [email or web address]  and access using universal  password for that web site. If you do not have the  password, please call the hospital operator.  10/24/2020, 5:25 PM

## 2020-10-25 DIAGNOSIS — L089 Local infection of the skin and subcutaneous tissue, unspecified: Secondary | ICD-10-CM | POA: Diagnosis not present

## 2020-10-25 DIAGNOSIS — T148XXA Other injury of unspecified body region, initial encounter: Secondary | ICD-10-CM | POA: Diagnosis not present

## 2020-10-25 LAB — CBC WITH DIFFERENTIAL/PLATELET
Abs Immature Granulocytes: 0.04 10*3/uL (ref 0.00–0.07)
Basophils Absolute: 0 10*3/uL (ref 0.0–0.1)
Basophils Relative: 0 %
Eosinophils Absolute: 0.4 10*3/uL (ref 0.0–0.5)
Eosinophils Relative: 5 %
HCT: 44.7 % (ref 36.0–46.0)
Hemoglobin: 13.5 g/dL (ref 12.0–15.0)
Immature Granulocytes: 1 %
Lymphocytes Relative: 25 %
Lymphs Abs: 2.1 10*3/uL (ref 0.7–4.0)
MCH: 29.1 pg (ref 26.0–34.0)
MCHC: 30.2 g/dL (ref 30.0–36.0)
MCV: 96.3 fL (ref 80.0–100.0)
Monocytes Absolute: 0.8 10*3/uL (ref 0.1–1.0)
Monocytes Relative: 9 %
Neutro Abs: 5.1 10*3/uL (ref 1.7–7.7)
Neutrophils Relative %: 60 %
Platelets: 300 10*3/uL (ref 150–400)
RBC: 4.64 MIL/uL (ref 3.87–5.11)
RDW: 13.6 % (ref 11.5–15.5)
WBC: 8.5 10*3/uL (ref 4.0–10.5)
nRBC: 0 % (ref 0.0–0.2)

## 2020-10-25 LAB — GLUCOSE, CAPILLARY
Glucose-Capillary: 177 mg/dL — ABNORMAL HIGH (ref 70–99)
Glucose-Capillary: 201 mg/dL — ABNORMAL HIGH (ref 70–99)
Glucose-Capillary: 168 mg/dL — ABNORMAL HIGH (ref 70–99)
Glucose-Capillary: 162 mg/dL — ABNORMAL HIGH (ref 70–99)

## 2020-10-25 LAB — BASIC METABOLIC PANEL
Anion gap: 13 (ref 5–15)
BUN: 22 mg/dL — ABNORMAL HIGH (ref 6–20)
CO2: 25 mmol/L (ref 22–32)
Calcium: 9.5 mg/dL (ref 8.9–10.3)
Chloride: 100 mmol/L (ref 98–111)
Creatinine, Ser: 1.01 mg/dL — ABNORMAL HIGH (ref 0.44–1.00)
GFR, Estimated: >60 mL/min (ref >60)
Glucose, Bld: 176 mg/dL — ABNORMAL HIGH (ref 70–99)
Potassium: 3.6 mmol/L (ref 3.5–5.1)
Sodium: 138 mmol/L (ref 135–145)

## 2020-10-25 NOTE — Plan of Care (Signed)
  Problem: Nutrition: Goal: Adequate nutrition will be maintained Outcome: Progressing   Problem: Coping: Goal: Level of anxiety will decrease Outcome: Progressing   Problem: Pain Managment: Goal: General experience of comfort will improve Outcome: Progressing   

## 2020-10-25 NOTE — Progress Notes (Signed)
PROGRESS NOTE    [name]  ION:[id number] DOB: 1966-04-22 DOA: 10/10/2020 PCP: Fanny Bien, MD   Chief Complaint  Patient presents with  . Wound Infection    Brief Narrative:  55 year old female morbidly obese type II diabetic with chronic lymphedema chronic nonhealing ulcers on both legs and calves and left upper thigh followed by wound clinic Dr. Asa Saunas was also being followed by home health nurses 3 times [facility] week which has been terminated few weeks ago by insurance her insurance company.  Now her wounds have become much larger with increased drainage and pain.  She is unable to change the dressings by herself due to the location of the wound and morbid obesity.  She does not walk she only moves around in the house in the rolling chair 1-2 times per day.  She was not able to get to the restroom so she has been urinating in the bed which has made the wounds even much worse.  She lives alone.  She describes her pain as 10 out of 10 and has been receiving IV narcotics.  Chronic wounds and multiple areas of skin breakdown.  When patient came to the ER she had very old dressings still in place which were malodorous and had bloody drainage with chronic skin changes in the  periwound area.  She received Ancef in the ER, admitted for further management.   Assessment & Plan:   Principal Problem:   Wound infection Active Problems:   Hyperkalemia   Chronic peripheral venous hypertension with lower extremity complication   Lymphedema   Obesity   Type 2 diabetes mellitus (HCC)   HTN (hypertension)   Cellulitis  Chronic bilateral lower extremity wounds  Chronic Lymphedema Noted increased pain, foul-smelling discharge on admission, now improved, with wound 100% pink and clean Discontinued vancomycin, cefepime (was only on abx from 4/21-4/22) Appreciate wound care input Unable to use home lymphedema pump due to not fitting properly due to increase in size of BLE as well as BLE  wound. Pt may benefit from manual wrapping, but no PT in the system is certified to do that - will discuss with patient  Pain management, with oxycodone, Flexeril PT/OT  Type 2 diabetes mellitus Hemoglobin A1c 6.5 SSI, Accu-Cheks, hypoglycemic protocol  Not on any medications at home, refuses to take metformin  Essential hypertension Started on metoprolol 12.5 twice daily Start HCTZ 12.5 mg daily, d/c lasix Relatively stable, follow adjust as needed  Mildly elevated d-dimer Noted mild tachycardia, HR in low 100s D-dimer mildly elevated LE [place] with suboptimal study Consider echo?  Low suspicion at this point, don't think she needs this - discussed with patient, will continue to monitor Pt too heavy for CT scan, unable to do CTA chest, although current low suspicion for PE (lack of CP, SOB, hypoxia), but at high risk Continue to monitor  Morbid obesity BMI 73 Lifestyle modification advised-discussed with patient extensively on 10/22/2020 about options of weight loss such as considering Dallis Darden consultation for bariatric surgery.  Patient completely refuses even Al[facility] Naser consultation, states that she has some friends that passed away after the surgery and is currently not interested.  Disposition Patient lives alone and is unable to care for herself.  She is unable to change the dressings on the wound because of location and morbid obesity and pain.  She does not ambulate.  She moves around in her Rollator couple of times during the day.  She is unable to cook for herself as she  is unable to stand.  She will need placement.  She is an unsafe discharge back home.  She does not have any family members that can assist her.  Pressure injuries Pressure Injury 10/11/20 Thigh Left;Posterior (Active)  10/11/20 0041  Location: Thigh  Location Orientation: Left;Posterior  Staging:   Wound Description (Comments):   Present on Admission:      Pressure Injury 10/11/20 Tibial Left;Posterior;Proximal  (Active)  10/11/20 0041  Location: Tibial  Location Orientation: Left;Posterior;Proximal  Staging:   Wound Description (Comments):   Present on Admission:     DVT prophylaxis: lovenox Code Status: full  Family Communication: none Disposition:   Status is: Inpatient  Remains inpatient appropriate because:Inpatient level of care appropriate due to severity of illness   Dispo: The patient is from: Home              Anticipated d/c is to: SNF              Patient currently is not medically stable to d/c.   Difficult to place patient Yes       Consultants:   none  Procedures: LE [place] Summary:  RIGHT:  - Suboptimal study due to patient body habitus. Unable to compress  visualized veins.    LEFT:  - Suboptimal study due to patient body habitus. Unable to compress  visualized veins.     *See table(s) above for measurements and observations.  Antimicrobials: Anti-infectives (From admission, onward)   Start     Dose/Rate Route Frequency Ordered Stop   10/11/20 1000  vancomycin (VANCOREADY) IVPB 2000 mg/400 mL  Status:  Discontinued        2,000 mg 200 mL/hr over 120 Minutes Intravenous Every 12 hours 10/10/20 2047 10/11/20 1140   10/11/20 0100  ceFEPIme (MAXIPIME) 2 g in sodium chloride 0.9 % 100 mL IVPB  Status:  Discontinued        2 g 200 mL/hr over 30 Minutes Intravenous Every 8 hours 10/10/20 2001 10/11/20 1140   10/10/20 2100  vancomycin (VANCOCIN) 2,500 mg in sodium chloride 0.9 % 500 mL IVPB        2,500 mg 250 mL/hr over 120 Minutes Intravenous  Once 10/10/20 2001 10/11/20 0135   10/10/20 1545  ceFAZolin (ANCEF) IVPB 2g/100 mL premix        2 g 200 mL/hr over 30 Minutes Intravenous  Once 10/10/20 1530 10/10/20 1723         Subjective: No new complaints  Objective: Vitals:   10/24/20 0537 10/24/20 1409 10/24/20 2203 10/25/20 0606  BP: (!) 161/105 (!) 143/63 113/63 121/61  Pulse: (!) 108 81 (!) 108 (!) 106  Resp: 18 16 20 19   Temp: 98.3 F (36.8  C) 98.4 F (36.9 C) 98.1 F (36.7 C) 98.4 F (36.9 C)  TempSrc: Oral Oral Oral Oral  SpO2: 91% 94% 94% 92%  Weight:      Height:        Intake/Output Summary (Last 24 hours) at 10/25/2020 1650 Last data filed at 10/25/2020 1400 Gross per 24 hour  Intake 960 ml  Output 1000 ml  Net -40 ml   Filed Weights   10/10/20 1954 10/22/20 1531  Weight: (!) 233.6 kg (!) 216.5 kg    Examination:  General: No acute distress. Cardiovascular: Heart sounds show Druanne Bosques regular rate, and rhythm.  Lungs: Clear to auscultation bilaterally Abdomen: Soft, nontender, nondistended  Neurological: Alert and oriented 3. Moves all extremities 4 . Cranial nerves II through XII grossly intact.  Skin: Warm and dry. No rashes or lesions. Extremities: dressing in place to LLE   Data Reviewed: I have personally reviewed following labs and imaging studies  CBC: Recent Labs  Lab 10/19/20 0548 10/22/20 0259 10/23/20 1308 10/25/20 0256  WBC 8.5 9.4 8.3 8.5  NEUTROABS 5.4 6.0 5.7 5.1  HGB 12.6 13.5 14.2 13.5  HCT 42.9 46.0 46.4* 44.7  MCV 99.8 98.9 96.7 96.3  PLT 291 289 327 884    Basic Metabolic Panel: Recent Labs  Lab 10/19/20 0548 10/22/20 0259 10/23/20 1308 10/25/20 0256  NA 135 134* 139 138  K 3.8 3.7 3.9 3.6  CL 99 98 100 100  CO2 26 23 26 25   GLUCOSE 170* 162* 213* 176*  BUN 16 17 19  22*  CREATININE 0.85 0.88 0.91 1.01*  CALCIUM 9.1 9.1 9.7 9.5  MG  --   --  1.4*  --     GFR: Estimated Creatinine Clearance: 131.4 mL/min (Lennix Rotundo) (by C-G formula based on SCr of 1.01 mg/dL (H)).  Liver Function Tests: Recent Labs  Lab 10/23/20 1308  AST 26  ALT 21  ALKPHOS 66  BILITOT 0.9  PROT 8.7*  ALBUMIN 3.8    CBG: Recent Labs  Lab 10/24/20 1632 10/24/20 2157 10/25/20 0723 10/25/20 1137 10/25/20 1610  GLUCAP 127* 195* 177* 201* 168*     No results found for this or any previous visit (from the past 240 hour(s)).       Radiology Studies: No results  found.      Scheduled Meds: . acetaminophen  1,000 mg Oral QHS  . enoxaparin (LOVENOX) injection  120 mg Subcutaneous Q24H  . ferrous sulfate  325 mg Oral Daily  . hydrochlorothiazide  12.5 mg Oral Daily  . hydrocortisone cream   Topical BID  . hydrOXYzine  10 mg Oral TID  . insulin aspart  0-20 Units Subcutaneous TID WC  . insulin aspart  0-5 Units Subcutaneous QHS  . magnesium oxide  400 mg Oral Daily  . metoprolol tartrate  12.5 mg Oral BID  . polyethylene glycol  17 g Oral Daily  . potassium chloride  20 mEq Oral Daily  . senna-docusate  1 tablet Oral BID   Continuous Infusions: . sodium chloride 250 mL (10/15/20 2301)     LOS: 8 days    Time spent: over 30 min    Fayrene Helper, MD [facility]   To contact the attending provider between 7A-7P or the covering provider during after hours 7P-7A, please log into the web site [email or web address] and access using universal [place] password for that web site. If you do not have the password, please call the hospital operator.  10/25/2020, 4:50 PM

## 2020-10-25 NOTE — Plan of Care (Signed)
  Problem: Education: Goal: Knowledge of General Education information will improve Description: Including pain rating scale, medication(s)/side effects and non-pharmacologic comfort measures Outcome: Progressing   Problem: Activity: Goal: Risk for activity intolerance will decrease Outcome: Progressing   Problem: Skin Integrity: Goal: Risk for impaired skin integrity will decrease Outcome: Progressing   

## 2020-10-25 NOTE — Progress Notes (Signed)
Occupational Therapy Treatment Patient Details Name: [name] MRN: [id number] DOB: 1965/07/07 Today's Date: 10/25/2020    History of present illness Pt admitted from home with multiple LE wounds, and FTT and unable to perform basic self care.  Pt reports she stayed in lift chair getting up only twice a day for BM or getting food.  Pt utilizing Rollator around home but pivoted to it from lift chair and wheeled around in seated position.  Pt states she put pads and diapers in chair so she did not have to get up to urinate.  Pt with hx of DM and lymphadema (has been unable to use her pressure sleeves so lymphadema has significantly worsened since last December).   OT comments  Treatment focused on moving body and extremities to prepare for functional mobility. Patient limited by pain - reporting pain in back, knees, hips and muscle spasms though she is willing to do what therapist asks. Requires rest breaks and changing positioning in bed using bed controls. Performed trunk movement with use of arms and bed rails and pseudo sit ups (though very limited/minimal) with pulling forward against therapist counter weight. Very limited ability to extend/flex knees and hip adduct/abduction - and needs active assistance. Hip ROM impaired. Patient is motivated and participatory.    Follow Up Recommendations  SNF    Equipment Recommendations  None recommended by OT    Recommendations for Other Services      Precautions / Restrictions Precautions Precautions: Fall Precaution Comments: LE wounds Restrictions Weight Bearing Restrictions: No       Mobility Bed Mobility                    Transfers                      Balance                                           ADL either performed or assessed with clinical judgement   ADL                                               Vision Patient Visual Report: No change from  baseline     Perception     Praxis      Cognition Arousal/Alertness: Awake/alert Behavior During Therapy: WFL for tasks assessed/performed Overall Cognitive Status: Within Functional Limits for tasks assessed                                          Exercises Other Exercises Other Exercises: Trunk movement with use of bed rails - lateral left, right and forward (with assistance fromt therapist) Other Exercises: Trunk flexion/pulling forward with UEs with therapsit assistance (like pseudo sit up x 3) Other Exercises: Knee extension/hip abduction and adduction - very limited   Shoulder Instructions       General Comments      Pertinent Vitals/ Pain       Pain Assessment: Faces Faces Pain Scale: Hurts little more Pain Location: Knees, Hips Pain Descriptors / Indicators: Aching;Guarding;Grimacing;Sharp Pain Intervention(s): Limited activity within patient's tolerance  Home Living  Prior Functioning/Environment              Frequency  Min 2X/week        Progress Toward Goals  OT Goals(current goals can now be found in the care plan section)  Progress towards OT goals: Progressing toward goals  Acute Rehab OT Goals Patient Stated Goal: take manage myself OT Goal Formulation: With patient Time For Goal Achievement: 10/31/20 Potential to Achieve Goals: [place] Discharge plan remains appropriate    Co-evaluation                 AM-PAC OT "6 Clicks" Daily Activity     Outcome Measure   Help from another person eating meals?: None Help from another person taking care of personal grooming?: A Little Help from another person toileting, which includes using toliet, bedpan, or urinal?: Total Help from another person bathing (including washing, rinsing, drying)?: A Lot Help from another person to put on and taking off regular upper body clothing?: A Lot Help from another person  to put on and taking off regular lower body clothing?: Total 6 Click Score: 13    End of Session    OT Visit Diagnosis: Other abnormalities of gait and mobility (R26.89);Muscle weakness (generalized) (M62.81);Pain   Activity Tolerance Patient limited by pain   Patient Left in chair;with call bell/phone within reach   Nurse Communication Mobility status;Need for lift equipment        Time: 3016-0109 OT Time Calculation (min): 40 min  Charges: OT General Charges $OT Visit: 1 Visit OT Treatments $Therapeutic Exercise: 38-52 mins  Jasdeep Kepner, OTR/L [place]  Office [phone] Pager: [place] 10/25/2020, 4:41 PM

## 2020-10-26 DIAGNOSIS — T148XXA Other injury of unspecified body region, initial encounter: Secondary | ICD-10-CM | POA: Diagnosis not present

## 2020-10-26 DIAGNOSIS — L089 Local infection of the skin and subcutaneous tissue, unspecified: Secondary | ICD-10-CM | POA: Diagnosis not present

## 2020-10-26 LAB — GLUCOSE, CAPILLARY
Glucose-Capillary: 166 mg/dL — ABNORMAL HIGH (ref 70–99)
Glucose-Capillary: 206 mg/dL — ABNORMAL HIGH (ref 70–99)
Glucose-Capillary: 162 mg/dL — ABNORMAL HIGH (ref 70–99)
Glucose-Capillary: 183 mg/dL — ABNORMAL HIGH (ref 70–99)

## 2020-10-26 MED ORDER — ENOXAPARIN SODIUM 100 MG/ML IJ SOSY
100.0000 mg | PREFILLED_SYRINGE | INTRAMUSCULAR | Status: DC
  Administered 2020-10-27 – 2020-11-26 (×31): 100 mg via SUBCUTANEOUS
  Filled 2020-10-26 (×33): qty 1

## 2020-10-26 NOTE — Progress Notes (Signed)
PROGRESS NOTE    [name]  TKW:[id number] DOB: 1965-11-29 DOA: 10/10/2020 PCP: Sierra Bien, MD   Chief Complaint  Patient presents with  . Wound Infection    Brief Narrative:  55 year old female morbidly obese type II diabetic with chronic lymphedema chronic nonhealing ulcers on both legs and calves and left upper thigh followed by wound clinic Dr. Asa Ware was also being followed by home health nurses 3 times [name] week which has been terminated few weeks ago by insurance her insurance company.  Now her wounds have become much larger with increased drainage and pain.  She is unable to change the dressings by herself due to the location of the wound and morbid obesity.  She does not walk she only moves around in the house in the rolling chair 1-2 times per day.  She was not able to get to the restroom so she has been urinating in the bed which has made the wounds even much worse.  She lives alone.  She describes her pain as 10 out of 10 and has been receiving IV narcotics.  Chronic wounds and multiple areas of skin breakdown.  When patient came to the ER she had very old dressings still in place which were malodorous and had bloody drainage with chronic skin changes in the  periwound area.  She received Ancef in the ER, admitted for further management.   Assessment & Plan:   Principal Problem:   Wound infection Active Problems:   Hyperkalemia   Chronic peripheral venous hypertension with lower extremity complication   Lymphedema   Obesity   Type 2 diabetes mellitus (HCC)   HTN (hypertension)   Cellulitis  Chronic bilateral lower extremity wounds  Chronic Lymphedema Noted increased pain, foul-smelling discharge on admission, now improved, with wound 100% pink and clean Discontinued vancomycin, cefepime (was only on abx from 4/21-4/22) Appreciate wound care input She's requesting to use lymphedema pump.  Will continue to discuss/reevaluate.  Unable to use home lymphedema  pump due to not fitting properly due to increase in size of BLE as well as BLE wound. Pt may benefit from manual wrapping, but no PT in the system is certified to do that - will discuss with patient  Pain management, with oxycodone, Flexeril PT/OT  Type 2 diabetes mellitus Hemoglobin A1c 6.5 SSI, Accu-Cheks, hypoglycemic protocol  Not on any medications at home, refuses to take metformin  Essential hypertension Started on metoprolol 12.5 twice daily Start HCTZ 12.5 mg daily, d/c lasix Relatively stable, follow adjust as needed  Mildly elevated d-dimer Noted mild tachycardia, HR in low 100s D-dimer mildly elevated LE [place] with suboptimal study Consider echo?  Low suspicion at this point, don't think she needs this - discussed with patient, will continue to monitor Pt too heavy for CT scan, unable to do CTA chest, although current low suspicion for PE (lack of CP, SOB, hypoxia), but at high risk Continue to monitor  Morbid obesity BMI 73 Lifestyle modification advised-discussed with patient extensively on 10/22/2020 about options of weight loss such as considering [name] consultation for bariatric surgery.  Patient completely refuses even [name] consultation, states that she has some friends that passed away after the surgery and is currently not interested.  Disposition Patient lives alone and is unable to care for herself.  She is unable to change the dressings on the wound because of location and morbid obesity and pain.  She does not ambulate.  She moves around in her Rollator couple of times during  the day.  She is unable to cook for herself as she is unable to stand.  She will need placement.  She is an unsafe discharge back home.  She does not have any family members that can assist her.  Pressure injuries Continue daily wound care Pressure Injury 10/11/20 Thigh Left;Posterior (Active)  10/11/20 0041  Location: Thigh  Location Orientation: Left;Posterior  Staging:   Wound Description  (Comments):   Present on Admission:      Pressure Injury 10/11/20 Tibial Left;Posterior;Proximal (Active)  10/11/20 0041  Location: Tibial  Location Orientation: Left;Posterior;Proximal  Staging:   Wound Description (Comments):   Present on Admission:     DVT prophylaxis: lovenox Code Status: full  Family Communication: none Disposition:   Status is: Inpatient  Remains inpatient appropriate because:Inpatient level of care appropriate due to severity of illness   Dispo: The patient is from: Home              Anticipated d/c is to: SNF              Patient currently is not medically stable to d/c.   Difficult to place patient Yes       Consultants:   none  Procedures: LE [place] Summary:  RIGHT:  - Suboptimal study due to patient body habitus. Unable to compress  visualized veins.    LEFT:  - Suboptimal study due to patient body habitus. Unable to compress  visualized veins.     *See table(s) above for measurements and observations.  Antimicrobials: Anti-infectives (From admission, onward)   Start     Dose/Rate Route Frequency Ordered Stop   10/11/20 1000  vancomycin (VANCOREADY) IVPB 2000 mg/400 mL  Status:  Discontinued        2,000 mg 200 mL/hr over 120 Minutes Intravenous Every 12 hours 10/10/20 2047 10/11/20 1140   10/11/20 0100  ceFEPIme (MAXIPIME) 2 g in sodium chloride 0.9 % 100 mL IVPB  Status:  Discontinued        2 g 200 mL/hr over 30 Minutes Intravenous Every 8 hours 10/10/20 2001 10/11/20 1140   10/10/20 2100  vancomycin (VANCOCIN) 2,500 mg in sodium chloride 0.9 % 500 mL IVPB        2,500 mg 250 mL/hr over 120 Minutes Intravenous  Once 10/10/20 2001 10/11/20 0135   10/10/20 1545  ceFAZolin (ANCEF) IVPB 2g/100 mL premix        2 g 200 mL/hr over 30 Minutes Intravenous  Once 10/10/20 1530 10/10/20 1723         Subjective: Asking about potentially using pump  Objective: Vitals:   10/25/20 0606 10/25/20 2111 10/26/20 0532 10/26/20 1328   BP: 121/61 (!) 143/54 (!) 161/86 107/62  Pulse: (!) 106 81 89 (!) 107  Resp: 19 17 17 18   Temp: 98.4 F (36.9 C) 98.6 F (37 C) 99 F (37.2 C) 98.6 F (37 C)  TempSrc: Oral Oral Oral   SpO2: 92% 94% 93% 91%  Weight:      Height:        Intake/Output Summary (Last 24 hours) at 10/26/2020 1638 Last data filed at 10/26/2020 1000 Gross per 24 hour  Intake 480 ml  Output 1000 ml  Net -520 ml   Filed Weights   10/10/20 1954 10/22/20 1531  Weight: (!) 233.6 kg (!) 216.5 kg    Examination:  General: No acute distress. Cardiovascular: Heart sounds show Lisaann Atha regular rate, and rhythm. No gallops or rubs. No murmurs. No JVD. Lungs: Clear to  auscultation bilaterally with good air movement. No rales, rhonchi or wheezes. Abdomen: Soft, nontender, nondistended with normal active bowel sounds. No masses. No hepatosplenomegaly. Neurological: Alert and oriented 3. Moves all extremities 4 with equal strength. Cranial nerves II through XII grossly intact. Skin: wound behind L knee, ulceration with clean base, several shallow ulcerations to buttocks and L thigh of varying depth Extremities: bilateral LE edmea    Data Reviewed: I have personally reviewed following labs and imaging studies  CBC: Recent Labs  Lab 10/22/20 0259 10/23/20 1308 10/25/20 0256  WBC 9.4 8.3 8.5  NEUTROABS 6.0 5.7 5.1  HGB 13.5 14.2 13.5  HCT 46.0 46.4* 44.7  MCV 98.9 96.7 96.3  PLT 289 327 856    Basic Metabolic Panel: Recent Labs  Lab 10/22/20 0259 10/23/20 1308 10/25/20 0256  NA 134* 139 138  K 3.7 3.9 3.6  CL 98 100 100  CO2 23 26 25   GLUCOSE 162* 213* 176*  BUN 17 19 22*  CREATININE 0.88 0.91 1.01*  CALCIUM 9.1 9.7 9.5  MG  --  1.4*  --     GFR: Estimated Creatinine Clearance: 131.4 mL/min (Shawn Dannenberg) (by C-G formula based on SCr of 1.01 mg/dL (H)).  Liver Function Tests: Recent Labs  Lab 10/23/20 1308  AST 26  ALT 21  ALKPHOS 66  BILITOT 0.9  PROT 8.7*  ALBUMIN 3.8    CBG: Recent Labs   Lab 10/25/20 1137 10/25/20 1610 10/25/20 2029 10/26/20 0734 10/26/20 1154  GLUCAP 201* 168* 162* 166* 206*     No results found for this or any previous visit (from the past 240 hour(s)).       Radiology Studies: No results found.      Scheduled Meds: . acetaminophen  1,000 mg Oral QHS  . [START ON 10/27/2020] enoxaparin (LOVENOX) injection  100 mg Subcutaneous Q24H  . ferrous sulfate  325 mg Oral Daily  . hydrochlorothiazide  12.5 mg Oral Daily  . hydrocortisone cream   Topical BID  . hydrOXYzine  10 mg Oral TID  . insulin aspart  0-20 Units Subcutaneous TID WC  . insulin aspart  0-5 Units Subcutaneous QHS  . magnesium oxide  400 mg Oral Daily  . metoprolol tartrate  12.5 mg Oral BID  . polyethylene glycol  17 g Oral Daily  . potassium chloride  20 mEq Oral Daily  . senna-docusate  1 tablet Oral BID   Continuous Infusions: . sodium chloride 250 mL (10/15/20 2301)     LOS: 9 days    Time spent: over 30 min    Fayrene Helper, MD [facility]   To contact the attending provider between 7A-7P or the covering provider during after hours 7P-7A, please log into the web site [email or web address] and access using universal [place] password for that web site. If you do not have the password, please call the hospital operator.  10/26/2020, 4:38 PM

## 2020-10-26 NOTE — Plan of Care (Signed)

## 2020-10-27 DIAGNOSIS — L089 Local infection of the skin and subcutaneous tissue, unspecified: Secondary | ICD-10-CM | POA: Diagnosis not present

## 2020-10-27 DIAGNOSIS — T148XXA Other injury of unspecified body region, initial encounter: Secondary | ICD-10-CM | POA: Diagnosis not present

## 2020-10-27 LAB — GLUCOSE, CAPILLARY
Glucose-Capillary: 182 mg/dL — ABNORMAL HIGH (ref 70–99)
Glucose-Capillary: 166 mg/dL — ABNORMAL HIGH (ref 70–99)
Glucose-Capillary: 152 mg/dL — ABNORMAL HIGH (ref 70–99)
Glucose-Capillary: 197 mg/dL — ABNORMAL HIGH (ref 70–99)

## 2020-10-27 NOTE — Plan of Care (Signed)
  Problem: Education: Goal: Knowledge of General Education information will improve Description: Including pain rating scale, medication(s)/side effects and non-pharmacologic comfort measures Outcome: Progressing   Problem: Pain Managment: Goal: General experience of comfort will improve Outcome: Progressing   Problem: Skin Integrity: Goal: Risk for impaired skin integrity will decrease Outcome: Progressing   

## 2020-10-27 NOTE — Progress Notes (Signed)
PROGRESS NOTE    [name]  QQV:[id number] DOB: 1966-04-01 DOA: 10/10/2020 PCP: Sierra Bien, MD   Chief Complaint  Patient presents with  . Wound Infection    Brief Narrative:  55 year old female morbidly obese type II diabetic with chronic lymphedema chronic nonhealing ulcers on both legs and calves and left upper thigh followed by wound clinic Dr. Asa Ware was also being followed by home health nurses 3 times [name] week which has been terminated few weeks ago by insurance her insurance company.  Now her wounds have become much larger with increased drainage and pain.  She is unable to change the dressings by herself due to the location of the wound and morbid obesity.  She does not walk she only moves around in the house in the rolling chair 1-2 times per day.  She was not able to get to the restroom so she has been urinating in the bed which has made the wounds even much worse.  She lives alone.  She describes her pain as 10 out of 10 and has been receiving IV narcotics.  Chronic wounds and multiple areas of skin breakdown.  When patient came to the ER she had very old dressings still in place which were malodorous and had bloody drainage with chronic skin changes in the  periwound area.  She received Ancef in the ER, admitted for further management.   Assessment & Plan:   Principal Problem:   Wound infection Active Problems:   Hyperkalemia   Chronic peripheral venous hypertension with lower extremity complication   Lymphedema   Obesity   Type 2 diabetes mellitus (HCC)   HTN (hypertension)   Cellulitis  Chronic bilateral lower extremity wounds  Chronic Lymphedema Noted increased pain, foul-smelling discharge on admission, now improved, with wound 100% pink and clean Discontinued vancomycin, cefepime (was only on abx from 4/21-4/22) Appreciate wound care input She's requesting to use lymphedema pump.  Will continue to discuss/reevaluate.  Unable to use home lymphedema  pump due to not fitting properly due to increase in size of BLE as well as BLE wound. Pt may benefit from manual wrapping, but no PT in the system is certified to do that - will discuss with patient  Pain management, with oxycodone, Flexeril PT/OT  Type 2 diabetes mellitus Hemoglobin A1c 6.5 SSI, Accu-Cheks, hypoglycemic protocol  Not on any medications at home, refuses to take metformin  Essential hypertension Started on metoprolol 12.5 twice daily Start HCTZ 12.5 mg daily, d/c lasix Relatively stable, follow adjust as needed  Mildly elevated d-dimer Noted mild tachycardia, HR in low 100s D-dimer mildly elevated LE [place] with suboptimal study Consider echo?  Low suspicion at this point, don't think she needs this - discussed with patient, will continue to monitor Pt too heavy for CT scan, unable to do CTA chest, although current low suspicion for PE (lack of CP, SOB, hypoxia), but at high risk Continue to monitor  Morbid obesity BMI 73 Lifestyle modification advised-discussed with patient extensively on 10/22/2020 about options of weight loss such as considering [name] consultation for bariatric surgery.  Patient completely refuses even [name] consultation, states that she has some friends that passed away after the surgery and is currently not interested.  Disposition Patient lives alone and is unable to care for herself.  She is unable to change the dressings on the wound because of location and morbid obesity and pain.  She does not ambulate.  She moves around in her Rollator couple of times during  the day.  She is unable to cook for herself as she is unable to stand.  She will need placement.  She is an unsafe discharge back home.  She does not have any family members that can assist her.  Pressure injuries Continue daily wound care Pressure Injury 10/11/20 Thigh Left;Posterior (Active)  10/11/20 0041  Location: Thigh  Location Orientation: Left;Posterior  Staging:   Wound Description  (Comments):   Present on Admission:      Pressure Injury 10/11/20 Tibial Left;Posterior;Proximal (Active)  10/11/20 0041  Location: Tibial  Location Orientation: Left;Posterior;Proximal  Staging:   Wound Description (Comments):   Present on Admission:     DVT prophylaxis: lovenox Code Status: full  Family Communication: none Disposition:   Status is: Inpatient  Remains inpatient appropriate because:Inpatient level of care appropriate due to severity of illness   Dispo: The patient is from: Home              Anticipated d/c is to: SNF              Patient currently is not medically stable to d/c.   Difficult to place patient Yes       Consultants:   none  Procedures: LE [place] Summary:  RIGHT:  - Suboptimal study due to patient body habitus. Unable to compress  visualized veins.    LEFT:  - Suboptimal study due to patient body habitus. Unable to compress  visualized veins.     *See table(s) above for measurements and observations.  Antimicrobials: Anti-infectives (From admission, onward)   Start     Dose/Rate Route Frequency Ordered Stop   10/11/20 1000  vancomycin (VANCOREADY) IVPB 2000 mg/400 mL  Status:  Discontinued        2,000 mg 200 mL/hr over 120 Minutes Intravenous Every 12 hours 10/10/20 2047 10/11/20 1140   10/11/20 0100  ceFEPIme (MAXIPIME) 2 g in sodium chloride 0.9 % 100 mL IVPB  Status:  Discontinued        2 g 200 mL/hr over 30 Minutes Intravenous Every 8 hours 10/10/20 2001 10/11/20 1140   10/10/20 2100  vancomycin (VANCOCIN) 2,500 mg in sodium chloride 0.9 % 500 mL IVPB        2,500 mg 250 mL/hr over 120 Minutes Intravenous  Once 10/10/20 2001 10/11/20 0135   10/10/20 1545  ceFAZolin (ANCEF) IVPB 2g/100 mL premix        2 g 200 mL/hr over 30 Minutes Intravenous  Once 10/10/20 1530 10/10/20 1723         Subjective: No new complaints, chronic knee pain   Objective: Vitals:   10/26/20 0532 10/26/20 1328 10/26/20 2057 10/27/20 0454   BP: (!) 161/86 107/62 138/63 (!) 144/88  Pulse: 89 (!) 107 (!) 109 100  Resp: 17 18 18 17   Temp: 99 F (37.2 C) 98.6 F (37 C) 98.6 F (37 C) 98.4 F (36.9 C)  TempSrc: Oral   Oral  SpO2: 93% 91% 93% 93%  Weight:      Height:        Intake/Output Summary (Last 24 hours) at 10/27/2020 0951 Last data filed at 10/27/2020 0456 Gross per 24 hour  Intake 240 ml  Output 1450 ml  Net -1210 ml   Filed Weights   10/10/20 1954 10/22/20 1531  Weight: (!) 233.6 kg (!) 216.5 kg    Examination:  General: No acute distress. Obese Cardiovascular: RRR Lungs: unlabored Abdomen: Soft, nontender, nondistended  Neurological: Alert and oriented 3. Moves all extremities  4. Cranial nerves II through XII grossly intact. Skin: Warm and dry. No rashes or lesions. Extremities: bilateral LE edema, dressing to LLE intact    Data Reviewed: I have personally reviewed following labs and imaging studies  CBC: Recent Labs  Lab 10/22/20 0259 10/23/20 1308 10/25/20 0256  WBC 9.4 8.3 8.5  NEUTROABS 6.0 5.7 5.1  HGB 13.5 14.2 13.5  HCT 46.0 46.4* 44.7  MCV 98.9 96.7 96.3  PLT 289 327 466    Basic Metabolic Panel: Recent Labs  Lab 10/22/20 0259 10/23/20 1308 10/25/20 0256  NA 134* 139 138  K 3.7 3.9 3.6  CL 98 100 100  CO2 23 26 25   GLUCOSE 162* 213* 176*  BUN 17 19 22*  CREATININE 0.88 0.91 1.01*  CALCIUM 9.1 9.7 9.5  MG  --  1.4*  --     GFR: Estimated Creatinine Clearance: 131.4 mL/min (Lawrie Tunks) (by C-G formula based on SCr of 1.01 mg/dL (H)).  Liver Function Tests: Recent Labs  Lab 10/23/20 1308  AST 26  ALT 21  ALKPHOS 66  BILITOT 0.9  PROT 8.7*  ALBUMIN 3.8    CBG: Recent Labs  Lab 10/26/20 0734 10/26/20 1154 10/26/20 1659 10/26/20 2153 10/27/20 0750  GLUCAP 166* 206* 162* 183* 182*     No results found for this or any previous visit (from the past 240 hour(s)).       Radiology Studies: No results found.      Scheduled Meds: . acetaminophen   1,000 mg Oral QHS  . enoxaparin (LOVENOX) injection  100 mg Subcutaneous Q24H  . ferrous sulfate  325 mg Oral Daily  . hydrochlorothiazide  12.5 mg Oral Daily  . hydrocortisone cream   Topical BID  . hydrOXYzine  10 mg Oral TID  . insulin aspart  0-20 Units Subcutaneous TID WC  . insulin aspart  0-5 Units Subcutaneous QHS  . magnesium oxide  400 mg Oral Daily  . metoprolol tartrate  12.5 mg Oral BID  . polyethylene glycol  17 g Oral Daily  . potassium chloride  20 mEq Oral Daily  . senna-docusate  1 tablet Oral BID   Continuous Infusions: . sodium chloride 250 mL (10/15/20 2301)     LOS: 10 days    Time spent: over 30 min    Fayrene Helper, MD [facility]   To contact the attending provider between 7A-7P or the covering provider during after hours 7P-7A, please log into the web site [email or web address] and access using universal [place] password for that web site. If you do not have the password, please call the hospital operator.  10/27/2020, 9:51 AM

## 2020-10-28 DIAGNOSIS — T148XXA Other injury of unspecified body region, initial encounter: Secondary | ICD-10-CM | POA: Diagnosis not present

## 2020-10-28 DIAGNOSIS — L089 Local infection of the skin and subcutaneous tissue, unspecified: Secondary | ICD-10-CM | POA: Diagnosis not present

## 2020-10-28 LAB — BASIC METABOLIC PANEL
Anion gap: 9 (ref 5–15)
BUN: 25 mg/dL — ABNORMAL HIGH (ref 6–20)
CO2: 30 mmol/L (ref 22–32)
Calcium: 9.4 mg/dL (ref 8.9–10.3)
Chloride: 97 mmol/L — ABNORMAL LOW (ref 98–111)
Creatinine, Ser: 0.87 mg/dL (ref 0.44–1.00)
GFR, Estimated: >60 mL/min (ref >60)
Glucose, Bld: 187 mg/dL — ABNORMAL HIGH (ref 70–99)
Potassium: 3.7 mmol/L (ref 3.5–5.1)
Sodium: 136 mmol/L (ref 135–145)

## 2020-10-28 LAB — CBC WITH DIFFERENTIAL/PLATELET
Abs Immature Granulocytes: 0.02 10*3/uL (ref 0.00–0.07)
Basophils Absolute: 0 10*3/uL (ref 0.0–0.1)
Basophils Relative: 1 %
Eosinophils Absolute: 0.4 10*3/uL (ref 0.0–0.5)
Eosinophils Relative: 5 %
HCT: 43.6 % (ref 36.0–46.0)
Hemoglobin: 13.1 g/dL (ref 12.0–15.0)
Immature Granulocytes: 0 %
Lymphocytes Relative: 31 %
Lymphs Abs: 2.2 10*3/uL (ref 0.7–4.0)
MCH: 29.1 pg (ref 26.0–34.0)
MCHC: 30 g/dL (ref 30.0–36.0)
MCV: 96.9 fL (ref 80.0–100.0)
Monocytes Absolute: 0.7 10*3/uL (ref 0.1–1.0)
Monocytes Relative: 10 %
Neutro Abs: 3.8 10*3/uL (ref 1.7–7.7)
Neutrophils Relative %: 53 %
Platelets: 290 10*3/uL (ref 150–400)
RBC: 4.5 MIL/uL (ref 3.87–5.11)
RDW: 13.3 % (ref 11.5–15.5)
WBC: 7.2 10*3/uL (ref 4.0–10.5)
nRBC: 0 % (ref 0.0–0.2)

## 2020-10-28 LAB — GLUCOSE, CAPILLARY
Glucose-Capillary: 194 mg/dL — ABNORMAL HIGH (ref 70–99)
Glucose-Capillary: 202 mg/dL — ABNORMAL HIGH (ref 70–99)
Glucose-Capillary: 159 mg/dL — ABNORMAL HIGH (ref 70–99)
Glucose-Capillary: 155 mg/dL — ABNORMAL HIGH (ref 70–99)

## 2020-10-28 NOTE — Progress Notes (Signed)
Physical Therapy Treatment Patient Details Name: [name] MRN: [id number] DOB: [birth year] Today's Date: 10/28/2020    History of Present Illness Pt admitted from home with multiple LE wounds, and FTT and unable to perform basic self care.  Pt reports she stayed in lift chair getting up only twice a day for BM or getting food.  Pt utilizing Rollator around home but pivoted to it from lift chair and wheeled around in seated position.  Pt states she put pads and diapers in chair so she did not have to get up to urinate.  Pt with hx of DM and lymphadema (has been unable to use her pressure sleeves so lymphadema has significantly worsened since last December).    PT Comments    Pt with noted incr effort when rolling R and L.  Reviewed exercises pt is to do on her own in the bed.  incr ROM bil hips with incr pt effort with LE exercise/improved muscle activation. Tolerated OOB to chair with maxisky lift, decr pain and spasms overall.  Pt's wt is down from admission from ~ 515# to 476#.  Will continue to assess appropriateness of use lymphedema pumps, may pursue manual wrapping if needed.  Continue to recommend SNF. Reviewed progress and goals with pt.  Continue PT POC   Follow Up Recommendations  SNF     Equipment Recommendations  None recommended by PT    Recommendations for Other Services       Precautions / Restrictions Precautions Precautions: Fall Precaution Comments: LE wounds Restrictions Weight Bearing Restrictions: No    Mobility  Bed Mobility Overal bed mobility: Needs Assistance Bed Mobility: Rolling Rolling: Max assist;+2 for physical assistance;+2 for safety/equipment         General bed mobility comments: +3, assist to slide laterally in preparation for rolling, verbal and visual cues for sequence and self assist. +2 to complete roll R/L, bed pad used to assist    Transfers                 General transfer comment: +3 to transfer to recliner.  Skin protected with use of bed pads. Improved tolerate today with rolling and transfer per patient. Did better when LEs supported during [facility] transfer.  Ambulation/Gait                 Stairs             Wheelchair Mobility    Modified Rankin (Stroke Patients Only)       Balance                                            Cognition Arousal/Alertness: Awake/alert Behavior During Therapy: WFL for tasks assessed/performed Overall Cognitive Status: Within Functional Limits for tasks assessed                                        Exercises General Exercises - Lower Extremity Ankle Circles/Pumps: AROM;Both;15 reps (cues to incr ROM) Other Exercises Other Exercises: Trunk flexion/pulling forward with UEs x 3 in chair Other Exercises: Knee extension/hip abduction and adduction x 7 reps bil Other Exercises: hip flexion/SLRs x 5 AAROM bil LEs Other Exercises: abd contraction/isometrics in bed and during lift (pt able to hold head up for transfer)  General Comments        Pertinent Vitals/Pain Pain Assessment: Faces Faces Pain Scale: Hurts even more Pain Location: Knees, Hips, back Pain Descriptors / Indicators: Contraction;Cramping;Discomfort;Grimacing;Guarding Pain Intervention(s): Limited activity within patient's tolerance;Monitored during session;Premedicated before session;Patient requesting pain meds-RN notified    Home Living                      Prior Function            PT Goals (current goals can now be found in the care plan section) Acute Rehab PT Goals Patient Stated Goal: take manage myself PT Goal Formulation: With patient Time For Goal Achievement: 11/07/20 Potential to Achieve Goals: Fair Progress towards PT goals: Progressing toward goals    Frequency    Min 2X/week      PT Plan Current plan remains appropriate    Co-evaluation              AM-PAC PT "6 Clicks" Mobility    Outcome Measure  Help needed turning from your back to your side while in a flat bed without using bedrails?: Total Help needed moving from lying on your back to sitting on the side of a flat bed without using bedrails?: Total Help needed moving to and from a bed to a chair (including a wheelchair)?: Total Help needed standing up from a chair using your arms (e.g., wheelchair or bedside chair)?: Total Help needed to walk in hospital room?: Total Help needed climbing 3-5 steps with a railing? : Total 6 Click Score: 6    End of Session Equipment Utilized During Treatment: Other (comment) (maxisky) Activity Tolerance: Patient tolerated treatment well Patient left: in chair;with call bell/phone within reach Nurse Communication: Mobility status;Need for lift equipment PT Visit Diagnosis: Muscle weakness (generalized) (M62.81);Difficulty in walking, not elsewhere classified (R26.2);Pain;Adult, failure to thrive (R62.7) Pain - Right/Left:  (diffuse) Pain - part of body:  (back knees hips)     Time: 8416-6063 PT Time Calculation (min) (ACUTE ONLY): 51 min  Charges:  $Therapeutic Exercise: 8-22 mins $Therapeutic Activity: 23-37 mins                     Baxter Flattery, PT  Acute Rehab Dept ([facility]) [phone] Pager [phone]  10/28/2020    [facility] 10/28/2020, 4:38 PM

## 2020-10-28 NOTE — Plan of Care (Signed)
  Problem: Pain Managment: Goal: General experience of comfort will improve Outcome: Progressing   

## 2020-10-28 NOTE — Plan of Care (Signed)
  Problem: Pain Managment: Goal: General experience of comfort will improve 10/28/2020 0059 by Blase Mess, RN Outcome: Progressing 10/28/2020 0053 by Blase Mess, RN Outcome: Progressing   Problem: Safety: Goal: Ability to remain free from injury will improve 10/28/2020 0059 by Blase Mess, RN Outcome: Progressing 10/28/2020 0053 by Blase Mess, RN Outcome: Progressing

## 2020-10-28 NOTE — Progress Notes (Signed)
PROGRESS NOTE    [name]  [id number]:[id number] DOB: Mar 09, 1966 DOA: 10/10/2020 PCP: Fanny Bien, MD   Chief Complaint  Patient presents with  . Wound Infection    Brief Narrative:  55 year old female morbidly obese type II diabetic with chronic lymphedema chronic nonhealing ulcers on both legs and calves and left upper thigh followed by wound clinic Dr. Asa Saunas was also being followed by home health nurses 3 times [name] week which has been terminated few weeks ago by insurance her insurance company.  Now her wounds have become much larger with increased drainage and pain.  She is unable to change the dressings by herself due to the location of the wound and morbid obesity.  She does not walk she only moves around in the house in the rolling chair 1-2 times per day.  She was not able to get to the restroom so she has been urinating in the bed which has made the wounds even much worse.  She lives alone.  She describes her pain as 10 out of 10 and has been receiving IV narcotics.  Chronic wounds and multiple areas of skin breakdown.  When patient came to the ER she had very old dressings still in place which were malodorous and had bloody drainage with chronic skin changes in the  periwound area.  She received Ancef in the ER, admitted for further management.   Assessment & Plan:   Principal Problem:   Wound infection Active Problems:   Hyperkalemia   Chronic peripheral venous hypertension with lower extremity complication   Lymphedema   Obesity   Type 2 diabetes mellitus (HCC)   HTN (hypertension)   Cellulitis  Chronic bilateral lower extremity wounds  Chronic Lymphedema Noted increased pain, foul-smelling discharge on admission, now improved, with wound 100% pink and clean Discontinued vancomycin, cefepime (was only on abx from 4/21-4/22) Appreciate wound care input She's requesting to use lymphedema pump.  Will look into who applies these in house.  Unable to use home  lymphedema pump due to not fitting properly due to increase in size of BLE as well as BLE wound. Pt may benefit from manual wrapping, but no PT in the system is certified to do that - will discuss with patient  Pain management, with oxycodone, Flexeril PT/OT  Type 2 diabetes mellitus Hemoglobin A1c 6.5 SSI, Accu-Cheks, hypoglycemic protocol  Not on any medications at home, refuses to take metformin  Essential hypertension Started on metoprolol 12.5 twice daily Start HCTZ 12.5 mg daily, d/c lasix Relatively stable, follow adjust as needed  Mildly elevated d-dimer Noted mild tachycardia, HR in low 100s D-dimer mildly elevated LE [place] with suboptimal study Consider echo?  Low suspicion at this point, don't think she needs this - discussed with patient, will continue to monitor Pt too heavy for CT scan, unable to do CTA chest, although current low suspicion for PE (lack of CP, SOB, hypoxia), but at high risk Continue to monitor  Morbid obesity BMI 73 Lifestyle modification advised-discussed with patient extensively on 10/22/2020 about options of weight loss such as considering [name] consultation for bariatric surgery.  Patient completely refuses even [name] consultation, states that she has some friends that passed away after the surgery and is currently not interested.  Disposition Patient lives alone and is unable to care for herself.  She is unable to change the dressings on the wound because of location and morbid obesity and pain.  She does not ambulate.  She moves around in her Rollator  couple of times during the day.  She is unable to cook for herself as she is unable to stand.  She will need placement.  She is an unsafe discharge back home.  She does not have any family members that can assist her.  Pressure injuries Continue daily wound care Pressure Injury 10/11/20 Thigh Left;Posterior (Active)  10/11/20 0041  Location: Thigh  Location Orientation: Left;Posterior  Staging:   Wound  Description (Comments):   Present on Admission:      Pressure Injury 10/11/20 Tibial Left;Posterior;Proximal (Active)  10/11/20 0041  Location: Tibial  Location Orientation: Left;Posterior;Proximal  Staging:   Wound Description (Comments):   Present on Admission:     DVT prophylaxis: lovenox Code Status: full  Family Communication: none Disposition:   Status is: Inpatient  Remains inpatient appropriate because:Inpatient level of care appropriate due to severity of illness   Dispo: The patient is from: Home              Anticipated d/c is to: SNF              Patient currently is not medically stable to d/c.   Difficult to place patient Yes       Consultants:   none  Procedures: LE [place] Summary:  RIGHT:  - Suboptimal study due to patient body habitus. Unable to compress  visualized veins.    LEFT:  - Suboptimal study due to patient body habitus. Unable to compress  visualized veins.     *See table(s) above for measurements and observations.  Antimicrobials: Anti-infectives (From admission, onward)   Start     Dose/Rate Route Frequency Ordered Stop   10/11/20 1000  vancomycin (VANCOREADY) IVPB 2000 mg/400 mL  Status:  Discontinued        2,000 mg 200 mL/hr over 120 Minutes Intravenous Every 12 hours 10/10/20 2047 10/11/20 1140   10/11/20 0100  ceFEPIme (MAXIPIME) 2 g in sodium chloride 0.9 % 100 mL IVPB  Status:  Discontinued        2 g 200 mL/hr over 30 Minutes Intravenous Every 8 hours 10/10/20 2001 10/11/20 1140   10/10/20 2100  vancomycin (VANCOCIN) 2,500 mg in sodium chloride 0.9 % 500 mL IVPB        2,500 mg 250 mL/hr over 120 Minutes Intravenous  Once 10/10/20 2001 10/11/20 0135   10/10/20 1545  ceFAZolin (ANCEF) IVPB 2g/100 mL premix        2 g 200 mL/hr over 30 Minutes Intravenous  Once 10/10/20 1530 10/10/20 1723         Subjective: chronci pain, no new complaints Infrequent bowel movements normal for her, she does not want increase bowel  regimen   Objective: Vitals:   10/27/20 0454 10/27/20 1442 10/27/20 2137 10/28/20 0547  BP: (!) 144/88 130/84 (!) 147/55 131/62  Pulse: 100 100 (!) 106 (!) 103  Resp: 17 19 18 18   Temp: 98.4 F (36.9 C) 98 F (36.7 C) 99 F (37.2 C) 98.2 F (36.8 C)  TempSrc: Oral Oral Oral Oral  SpO2: 93% 92% 97% 91%  Weight:      Height:        Intake/Output Summary (Last 24 hours) at 10/28/2020 1135 Last data filed at 10/28/2020 0900 Gross per 24 hour  Intake 480 ml  Output 1650 ml  Net -1170 ml   Filed Weights   10/10/20 1954 10/22/20 1531  Weight: (!) 233.6 kg (!) 216.5 kg    Examination:  General: No acute distress. Cardiovascular: Heart  sounds show [name] regular rate, and rhythm.  Lungs: Clear to auscultation bilaterally Abdomen: Soft, nontender, nondistended  Neurological: Alert and oriented 3. Moves all extremities 4. Cranial nerves II through XII grossly intact. Skin: Warm and dry. No rashes or lesions. Extremities: bilateral LE edema, LLE dressing intact     Data Reviewed: I have personally reviewed following labs and imaging studies  CBC: Recent Labs  Lab 10/22/20 0259 10/23/20 1308 10/25/20 0256 10/28/20 0308  WBC 9.4 8.3 8.5 7.2  NEUTROABS 6.0 5.7 5.1 3.8  HGB 13.5 14.2 13.5 13.1  HCT 46.0 46.4* 44.7 43.6  MCV 98.9 96.7 96.3 96.9  PLT 289 327 300 102    Basic Metabolic Panel: Recent Labs  Lab 10/22/20 0259 10/23/20 1308 10/25/20 0256 10/28/20 0308  NA 134* 139 138 136  K 3.7 3.9 3.6 3.7  CL 98 100 100 97*  CO2 23 26 25 30   GLUCOSE 162* 213* 176* 187*  BUN 17 19 22* 25*  CREATININE 0.88 0.91 1.01* 0.87  CALCIUM 9.1 9.7 9.5 9.4  MG  --  1.4*  --   --     GFR: Estimated Creatinine Clearance: 152.5 mL/min (by C-G formula based on SCr of 0.87 mg/dL).  Liver Function Tests: Recent Labs  Lab 10/23/20 1308  AST 26  ALT 21  ALKPHOS 66  BILITOT 0.9  PROT 8.7*  ALBUMIN 3.8    CBG: Recent Labs  Lab 10/27/20 0750 10/27/20 1222 10/27/20 1700  10/27/20 2136 10/28/20 0747  GLUCAP 182* 166* 152* 197* 194*     No results found for this or any previous visit (from the past 240 hour(s)).       Radiology Studies: No results found.      Scheduled Meds: . acetaminophen  1,000 mg Oral QHS  . enoxaparin (LOVENOX) injection  100 mg Subcutaneous Q24H  . ferrous sulfate  325 mg Oral Daily  . hydrochlorothiazide  12.5 mg Oral Daily  . hydrocortisone cream   Topical BID  . hydrOXYzine  10 mg Oral TID  . insulin aspart  0-20 Units Subcutaneous TID WC  . insulin aspart  0-5 Units Subcutaneous QHS  . magnesium oxide  400 mg Oral Daily  . metoprolol tartrate  12.5 mg Oral BID  . polyethylene glycol  17 g Oral Daily  . potassium chloride  20 mEq Oral Daily  . senna-docusate  1 tablet Oral BID   Continuous Infusions: . sodium chloride 250 mL (10/15/20 2301)     LOS: 11 days    Time spent: over 30 min    Fayrene Helper, MD [facility]   To contact the attending provider between 7A-7P or the covering provider during after hours 7P-7A, please log into the web site [email or web address] and access using universal [place] password for that web site. If you do not have the password, please call the hospital operator.  10/28/2020, 11:35 AM

## 2020-10-28 NOTE — TOC Progression Note (Signed)
Transition of Care [facility]) - Progression Note   Patient Details  Name: [name] MRN: [id number] Date of Birth: August 01, 1965  Transition of Care [facility]) CM/SW [place], LCSW Phone Number: 10/28/2020, 3:19 PM  Clinical Narrative: CSW attempted to call Debbie at [facility] to follow up on referral, but was unable to reach her. CSW left voicemail requesting call back for an update on the referral. TOC to continue pursuing SNF placement.  Expected Discharge Plan: [place] Barriers to Discharge: Continued Medical Work up  Expected Discharge Plan and Services Expected Discharge Plan: [place] In-house Referral: Clinical Social Work Discharge Planning Services: CM Consult Post Acute Care Choice: [place] arrangements for the past 2 months: Apartment  Readmission Risk Interventions No flowsheet data found.

## 2020-10-28 NOTE — Care Management Important Message (Signed)
Medicare IM printed remotely for Social Work team to give to the patient. 

## 2020-10-29 DIAGNOSIS — T148XXA Other injury of unspecified body region, initial encounter: Secondary | ICD-10-CM | POA: Diagnosis not present

## 2020-10-29 DIAGNOSIS — L089 Local infection of the skin and subcutaneous tissue, unspecified: Secondary | ICD-10-CM | POA: Diagnosis not present

## 2020-10-29 LAB — GLUCOSE, CAPILLARY
Glucose-Capillary: 214 mg/dL — ABNORMAL HIGH (ref 70–99)
Glucose-Capillary: 198 mg/dL — ABNORMAL HIGH (ref 70–99)
Glucose-Capillary: 169 mg/dL — ABNORMAL HIGH (ref 70–99)
Glucose-Capillary: 172 mg/dL — ABNORMAL HIGH (ref 70–99)

## 2020-10-29 NOTE — Progress Notes (Signed)
Physical Therapy Treatment Patient Details Name: [name] MRN: [id number] DOB: 04/02/1966 Today's Date: 10/29/2020    History of Present Illness Pt admitted from home with multiple LE wounds, and FTT and unable to perform basic self care.  Pt reports she stayed in lift chair getting up only twice a day for BM or getting food.  Pt utilizing Rollator around home but pivoted to it from lift chair and wheeled around in seated position.  Pt states she put pads and diapers in chair so she did not have to get up to urinate.  Pt with hx of DM and lymphadema (has been unable to use her pressure sleeves so lymphadema has significantly worsened since last December).    PT Comments    Pt sleeping soundly upon entering room. Seen this pm for removal of pumps and skin check, pt tol well ~ 1.5 hrs with pumps active, report only of some knee pain. after pumps removed, proceeded with tilt. Range 25* to 45*, 20 to 101kg WBing max for ~ 8 minutes.   Total tilt time ~ 32 minutes  Follow Up Recommendations  SNF     Equipment Recommendations  None recommended by PT    Recommendations for Other Services       Precautions / Restrictions Precautions Precautions: Fall Precaution Comments: LE wounds Restrictions Weight Bearing Restrictions: No    Mobility  Bed Mobility                 Start Time: 1518 Angle: 45 degrees Total Minutes in Angle: 8 minutes Patient Response: Anxious  Transfers                    Ambulation/Gait                 Stairs             Wheelchair Mobility    Modified Rankin (Stroke Patients Only)       Balance                                            Cognition Arousal/Alertness: Awake/alert Behavior During Therapy: WFL for tasks assessed/performed Overall Cognitive Status: Within Functional Limits for tasks assessed                                        Exercises Other  Exercises Other Exercises: hip flexion/SLRs to remove pumps    General Comments        Pertinent Vitals/Pain Pain Assessment: Faces Faces Pain Scale: Hurts even more Pain Location: Knees, Hips, back Pain Descriptors / Indicators: Contraction;Cramping;Discomfort;Grimacing;Guarding Pain Intervention(s): Limited activity within patient's tolerance;Monitored during session;Premedicated before session;Repositioned    Home Living                      Prior Function            PT Goals (current goals can now be found in the care plan section) Acute Rehab PT Goals Patient Stated Goal: take manage myself PT Goal Formulation: With patient Time For Goal Achievement: 11/07/20 Potential to Achieve Goals: Fair Progress towards PT goals: Progressing toward goals    Frequency    Min 2X/week      PT Plan Current plan remains appropriate  Co-evaluation              AM-PAC PT "6 Clicks" Mobility   Outcome Measure  Help needed turning from your back to your side while in a flat bed without using bedrails?: Total Help needed moving from lying on your back to sitting on the side of a flat bed without using bedrails?: Total Help needed moving to and from a bed to a chair (including a wheelchair)?: Total Help needed standing up from a chair using your arms (e.g., wheelchair or bedside chair)?: Total Help needed to walk in hospital room?: Total Help needed climbing 3-5 steps with a railing? : Total 6 Click Score: 6    End of Session   Activity Tolerance: Patient tolerated treatment well Patient left: with call bell/phone within reach;in bed Nurse Communication: Other (comment) (skin care, ADLs, dressign changes, need for interdry) PT Visit Diagnosis: Muscle weakness (generalized) (M62.81);Difficulty in walking, not elsewhere classified (R26.2);Pain;Adult, failure to thrive (R62.7) Pain - Right/Left: Right Pain - part of body: Knee;Leg     Time: 1501-1550 PT Time  Calculation (min) (ACUTE ONLY): 49 min  Charges:  $Therapeutic Exercise: 8-22 mins $Therapeutic Activity: 23-37 mins                     Baxter Flattery, PT  Acute Rehab Dept ([place]) [phone] Pager [phone]  10/29/2020    [facility] 10/29/2020, 4:05 PM

## 2020-10-29 NOTE — Progress Notes (Signed)
Physical Therapy Treatment Patient Details Name: [name] MRN: [id number] DOB: [birth year] Today's Date: 10/29/2020    History of Present Illness Pt admitted from home with multiple LE wounds, and FTT and unable to perform basic self care.  Pt reports she stayed in lift chair getting up only twice a day for BM or getting food.  Pt utilizing Rollator around home but pivoted to it from lift chair and wheeled around in seated position.  Pt states she put pads and diapers in chair so she did not have to get up to urinate.  Pt with hx of DM and lymphadema (has been unable to use her pressure sleeves so lymphadema has significantly worsened since last December).    PT Comments    Pt assessed further for placement of lymphedema pumps today; pt participated in rolling R/L, scooting  up in bed in supine and also with bil leg lifts for skin assessment and placement of pumps. Pt to do 2 cycles with lymphedema pumps  today and no more, advised her to call with any pain, SOB, complications etc.  Plan is to work on a schedule with nursing staff for lymphedema pumps, lifting OOB to chair with maxisky and  multiple tilts per day.  Will continue follow in acute setting. Continue to recommend SNF   Follow Up Recommendations  SNF     Equipment Recommendations  None recommended by PT    Recommendations for Other Services       Precautions / Restrictions Precautions Precautions: Fall Precaution Comments: LE wounds Restrictions Weight Bearing Restrictions: No    Mobility  Bed Mobility Overal bed mobility: Needs Assistance Bed Mobility: Rolling Rolling: Max assist;+2 for physical assistance;+2 for safety/equipment         General bed mobility comments: +3 assist to slide laterally in preparation for rolling, verbal and visual cues for sequence and self assist. +2 to complete roll R/L, bed pad used to assist; dressings changed while skin inspection done in preparation for application of  lymphadema pumps    Transfers                    Ambulation/Gait                 Stairs             Wheelchair Mobility    Modified Rankin (Stroke Patients Only)       Balance                                            Cognition Arousal/Alertness: Awake/alert Behavior During Therapy: WFL for tasks assessed/performed Overall Cognitive Status: Within Functional Limits for tasks assessed                                        Exercises Other Exercises Other Exercises: hip flexion/SLRs x 5 AAROM bil LEs (for placement of lymphadema pumps and skin inspection )    General Comments        Pertinent Vitals/Pain Pain Assessment: Faces Faces Pain Scale: Hurts even more Pain Location: Knees, Hips, back Pain Descriptors / Indicators: Contraction;Cramping;Discomfort;Grimacing;Guarding Pain Intervention(s): Limited activity within patient's tolerance;Monitored during session;Premedicated before session;Repositioned    Home Living  Prior Function            PT Goals (current goals can now be found in the care plan section) Acute Rehab PT Goals Patient Stated Goal: take manage myself PT Goal Formulation: With patient Time For Goal Achievement: 11/07/20 Potential to Achieve Goals: Fair Progress towards PT goals: Progressing toward goals    Frequency    Min 2X/week      PT Plan Current plan remains appropriate    Co-evaluation              AM-PAC PT "6 Clicks" Mobility   Outcome Measure  Help needed turning from your back to your side while in a flat bed without using bedrails?: Total Help needed moving from lying on your back to sitting on the side of a flat bed without using bedrails?: Total Help needed moving to and from a bed to a chair (including a wheelchair)?: Total Help needed standing up from a chair using your arms (e.g., wheelchair or bedside chair)?:  Total Help needed to walk in hospital room?: Total Help needed climbing 3-5 steps with a railing? : Total 6 Click Score: 6    End of Session   Activity Tolerance: Patient tolerated treatment well Patient left: with call bell/phone within reach;in bed Nurse Communication: Other (comment) (skin care, ADLs, dressign changes, need for interdry) PT Visit Diagnosis: Muscle weakness (generalized) (M62.81);Difficulty in walking, not elsewhere classified (R26.2);Pain;Adult, failure to thrive (R62.7) Pain - Right/Left: Right Pain - part of body: Knee;Leg     Time: 1740-8144 PT Time Calculation (min) (ACUTE ONLY): 61 min  Charges:  $Therapeutic Exercise: 8-22 mins $Therapeutic Activity: 23-37 mins $Self Care/Home Management: 8-22                     Baxter Flattery, PT  Acute Rehab Dept [facility]) [phone] Pager [phone]  10/29/2020    [facility] 10/29/2020, 1:07 PM

## 2020-10-29 NOTE — Progress Notes (Signed)
PROGRESS NOTE    [name]  BWG:[id number] DOB: [birth year] DOA: 10/10/2020 PCP: Sierra Bien, MD   Chief Complaint  Patient presents with  . Wound Infection    Brief Narrative:  55 year old female morbidly obese type II diabetic with chronic lymphedema chronic nonhealing ulcers on both legs and calves and left upper thigh followed by wound clinic Dr. Asa Ware was also being followed by home health nurses 3 times [name] week which has been terminated few weeks ago by insurance her insurance company.  Now her wounds have become much larger with increased drainage and pain.  She is unable to change the dressings by herself due to the location of the wound and morbid obesity.  She does not walk she only moves around in the house in the rolling chair 1-2 times per day.  She was not able to get to the restroom so she has been urinating in the bed which has made the wounds even much worse.  She lives alone.  She describes her pain as 10 out of 10 and has been receiving IV narcotics.  Chronic wounds and multiple areas of skin breakdown.  When patient came to the ER she had very old dressings still in place which were malodorous and had bloody drainage with chronic skin changes in the  periwound area.  She received Ancef in the ER, admitted for further management.  She's now off antibiotics.  Currently discharge is pending SNF placement.  Difficult placement given weight.   Assessment & Plan:   Principal Problem:   Wound infection Active Problems:   Hyperkalemia   Chronic peripheral venous hypertension with lower extremity complication   Lymphedema   Obesity   Type 2 diabetes mellitus (HCC)   HTN (hypertension)   Cellulitis  Chronic bilateral lower extremity wounds  Chronic Lymphedema Noted increased pain, foul-smelling discharge on admission, now improved, with wound 100% pink and clean Discontinued vancomycin, cefepime (was only on abx from 4/21-4/22) Appreciate wound care  input Lymphedema pumps, appreciate PT assistance with these - follow volume status/resp status with mobilization of lymph fluid Pain management, with oxycodone, Flexeril PT/OT  Type 2 diabetes mellitus Hemoglobin A1c 6.5 SSI, Accu-Cheks, hypoglycemic protocol  Not on any medications at home, refuses to take metformin  Essential hypertension Started on metoprolol 12.5 twice daily Start HCTZ 12.5 mg daily, d/c lasix Relatively stable, follow adjust as needed  Mildly elevated d-dimer Noted mild tachycardia, HR in low 100s D-dimer mildly elevated LE [place] with suboptimal study Consider echo?  Low suspicion at this point, don't think she needs this - discussed with patient, will continue to monitor Pt too heavy for CT scan, unable to do CTA chest, although current low suspicion for PE (lack of CP, SOB, hypoxia), but at high risk Continue to monitor  Morbid obesity BMI 73 Lifestyle modification advised-discussed with patient extensively on 10/22/2020 about options of weight loss such as considering [name] consultation for bariatric surgery.  Patient completely refuses even [name] consultation, states that she has some friends that passed away after the surgery and is currently not interested.  Disposition Patient lives alone and is unable to care for herself.  She is unable to change the dressings on the wound because of location and morbid obesity and pain.  She does not ambulate.  She moves around in her Rollator couple of times during the day.  She is unable to cook for herself as she is unable to stand.  She will need placement.  She is  an unsafe discharge back home.  She does not have any family members that can assist her.  Appreciate therapy, working on schedule with nursing staff for lymphedema pumps, OOB to chair, and multiple tilts per day.  Pressure injuries Continue daily wound care Pressure Injury 10/11/20 Thigh Left;Posterior (Active)  10/11/20 0041  Location: Thigh  Location  Orientation: Left;Posterior  Staging:   Wound Description (Comments):   Present on Admission:      Pressure Injury 10/11/20 Tibial Left;Posterior;Proximal (Active)  10/11/20 0041  Location: Tibial  Location Orientation: Left;Posterior;Proximal  Staging:   Wound Description (Comments):   Present on Admission:     DVT prophylaxis: lovenox Code Status: full  Family Communication: none Disposition:   Status is: Inpatient  Remains inpatient appropriate because:Inpatient level of care appropriate due to severity of illness   Dispo: The patient is from: Home              Anticipated d/c is to: SNF              Patient currently is not medically stable to d/c.   Difficult to place patient Yes       Consultants:   none  Procedures: LE [place] Summary:  RIGHT:  - Suboptimal study due to patient body habitus. Unable to compress  visualized veins.    LEFT:  - Suboptimal study due to patient body habitus. Unable to compress  visualized veins.     *See table(s) above for measurements and observations.  Antimicrobials: Anti-infectives (From admission, onward)   Start     Dose/Rate Route Frequency Ordered Stop   10/11/20 1000  vancomycin (VANCOREADY) IVPB 2000 mg/400 mL  Status:  Discontinued        2,000 mg 200 mL/hr over 120 Minutes Intravenous Every 12 hours 10/10/20 2047 10/11/20 1140   10/11/20 0100  ceFEPIme (MAXIPIME) 2 g in sodium chloride 0.9 % 100 mL IVPB  Status:  Discontinued        2 g 200 mL/hr over 30 Minutes Intravenous Every 8 hours 10/10/20 2001 10/11/20 1140   10/10/20 2100  vancomycin (VANCOCIN) 2,500 mg in sodium chloride 0.9 % 500 mL IVPB        2,500 mg 250 mL/hr over 120 Minutes Intravenous  Once 10/10/20 2001 10/11/20 0135   10/10/20 1545  ceFAZolin (ANCEF) IVPB 2g/100 mL premix        2 g 200 mL/hr over 30 Minutes Intravenous  Once 10/10/20 1530 10/10/20 1723         Subjective: No new complaints spasms  Objective: Vitals:   10/28/20  1340 10/28/20 2121 10/29/20 0514 10/29/20 1311  BP: (!) 154/99 (!) 154/116 (!) 148/104 117/66  Pulse: 98 (!) 107 97 97  Resp: 20 16 17 20   Temp: 98.4 F (36.9 C) 97.9 F (36.6 C) 98 F (36.7 C)   TempSrc: Oral Oral Oral   SpO2: 93% 95% 91% 93%  Weight:      Height:        Intake/Output Summary (Last 24 hours) at 10/29/2020 1639 Last data filed at 10/29/2020 0600 Gross per 24 hour  Intake 483 ml  Output 400 ml  Net 83 ml   Filed Weights   10/10/20 1954 10/22/20 1531  Weight: (!) 233.6 kg (!) 216.5 kg    Examination:  General: No acute distress.  Obese. Cardiovascular: Heart sounds show Maribeth Jiles regular rate, and rhythm.  Lungs: Clear to auscultation bilaterally  Abdomen: Soft, nontender, nondistended Neurological: Alert and oriented 3. Moves  all extremities 4 . Cranial nerves II through XII grossly intact. Skin: Warm and dry. No rashes or lesions. Extremities: seen with lymphedema pumps on     Data Reviewed: I have personally reviewed following labs and imaging studies  CBC: Recent Labs  Lab 10/23/20 1308 10/25/20 0256 10/28/20 0308  WBC 8.3 8.5 7.2  NEUTROABS 5.7 5.1 3.8  HGB 14.2 13.5 13.1  HCT 46.4* 44.7 43.6  MCV 96.7 96.3 96.9  PLT 327 300 637    Basic Metabolic Panel: Recent Labs  Lab 10/23/20 1308 10/25/20 0256 10/28/20 0308  NA 139 138 136  K 3.9 3.6 3.7  CL 100 100 97*  CO2 26 25 30   GLUCOSE 213* 176* 187*  BUN 19 22* 25*  CREATININE 0.91 1.01* 0.87  CALCIUM 9.7 9.5 9.4  MG 1.4*  --   --     GFR: Estimated Creatinine Clearance: 152.5 mL/min (by C-G formula based on SCr of 0.87 mg/dL).  Liver Function Tests: Recent Labs  Lab 10/23/20 1308  AST 26  ALT 21  ALKPHOS 66  BILITOT 0.9  PROT 8.7*  ALBUMIN 3.8    CBG: Recent Labs  Lab 10/28/20 1157 10/28/20 1621 10/28/20 2120 10/29/20 0715 10/29/20 1135  GLUCAP 202* 159* 155* 214* 198*     No results found for this or any previous visit (from the past 240 hour(s)).        Radiology Studies: No results found.      Scheduled Meds: . acetaminophen  1,000 mg Oral QHS  . enoxaparin (LOVENOX) injection  100 mg Subcutaneous Q24H  . ferrous sulfate  325 mg Oral Daily  . hydrochlorothiazide  12.5 mg Oral Daily  . hydrocortisone cream   Topical BID  . hydrOXYzine  10 mg Oral TID  . insulin aspart  0-20 Units Subcutaneous TID WC  . insulin aspart  0-5 Units Subcutaneous QHS  . magnesium oxide  400 mg Oral Daily  . metoprolol tartrate  12.5 mg Oral BID  . polyethylene glycol  17 g Oral Daily  . potassium chloride  20 mEq Oral Daily  . senna-docusate  1 tablet Oral BID   Continuous Infusions: . sodium chloride 250 mL (10/15/20 2301)     LOS: 12 days    Time spent: over 30 min    Fayrene Helper, MD [facility]   To contact the attending provider between 7A-7P or the covering provider during after hours 7P-7A, please log into the web site [email or web address] and access using universal [place] password for that web site. If you do not have the password, please call the hospital operator.  10/29/2020, 4:39 PM

## 2020-10-30 DIAGNOSIS — I87399 Chronic venous hypertension (idiopathic) with other complications of unspecified lower extremity: Secondary | ICD-10-CM | POA: Diagnosis not present

## 2020-10-30 DIAGNOSIS — T148XXA Other injury of unspecified body region, initial encounter: Secondary | ICD-10-CM | POA: Diagnosis not present

## 2020-10-30 DIAGNOSIS — I10 Essential (primary) hypertension: Secondary | ICD-10-CM | POA: Diagnosis not present

## 2020-10-30 DIAGNOSIS — I890 Lymphedema, not elsewhere classified: Secondary | ICD-10-CM | POA: Diagnosis not present

## 2020-10-30 LAB — CBC WITH DIFFERENTIAL/PLATELET
Abs Immature Granulocytes: 0.02 10*3/uL (ref 0.00–0.07)
Basophils Absolute: 0 10*3/uL (ref 0.0–0.1)
Basophils Relative: 0 %
Eosinophils Absolute: 0.3 10*3/uL (ref 0.0–0.5)
Eosinophils Relative: 5 %
HCT: 44.2 % (ref 36.0–46.0)
Hemoglobin: 13.2 g/dL (ref 12.0–15.0)
Immature Granulocytes: 0 %
Lymphocytes Relative: 30 %
Lymphs Abs: 1.8 10*3/uL (ref 0.7–4.0)
MCH: 29.2 pg (ref 26.0–34.0)
MCHC: 29.9 g/dL — ABNORMAL LOW (ref 30.0–36.0)
MCV: 97.8 fL (ref 80.0–100.0)
Monocytes Absolute: 0.6 10*3/uL (ref 0.1–1.0)
Monocytes Relative: 9 %
Neutro Abs: 3.4 10*3/uL (ref 1.7–7.7)
Neutrophils Relative %: 56 %
Platelets: 271 10*3/uL (ref 150–400)
RBC: 4.52 MIL/uL (ref 3.87–5.11)
RDW: 13.3 % (ref 11.5–15.5)
WBC: 6.2 10*3/uL (ref 4.0–10.5)
nRBC: 0 % (ref 0.0–0.2)

## 2020-10-30 LAB — GLUCOSE, CAPILLARY
Glucose-Capillary: 195 mg/dL — ABNORMAL HIGH (ref 70–99)
Glucose-Capillary: 200 mg/dL — ABNORMAL HIGH (ref 70–99)
Glucose-Capillary: 203 mg/dL — ABNORMAL HIGH (ref 70–99)
Glucose-Capillary: 186 mg/dL — ABNORMAL HIGH (ref 70–99)

## 2020-10-30 LAB — COMPREHENSIVE METABOLIC PANEL
ALT: 15 U/L (ref 0–44)
AST: 17 U/L (ref 15–41)
Albumin: 3.4 g/dL — ABNORMAL LOW (ref 3.5–5.0)
Alkaline Phosphatase: 63 U/L (ref 38–126)
Anion gap: 11 (ref 5–15)
BUN: 22 mg/dL — ABNORMAL HIGH (ref 6–20)
CO2: 25 mmol/L (ref 22–32)
Calcium: 9.3 mg/dL (ref 8.9–10.3)
Chloride: 100 mmol/L (ref 98–111)
Creatinine, Ser: 0.76 mg/dL (ref 0.44–1.00)
GFR, Estimated: >60 mL/min (ref >60)
Glucose, Bld: 200 mg/dL — ABNORMAL HIGH (ref 70–99)
Potassium: 3.5 mmol/L (ref 3.5–5.1)
Sodium: 136 mmol/L (ref 135–145)
Total Bilirubin: 0.8 mg/dL (ref 0.3–1.2)
Total Protein: 7.7 g/dL (ref 6.5–8.1)

## 2020-10-30 LAB — PHOSPHORUS: Phosphorus: 4.8 mg/dL — ABNORMAL HIGH (ref 2.5–4.6)

## 2020-10-30 LAB — MAGNESIUM: Magnesium: 1.6 mg/dL — ABNORMAL LOW (ref 1.7–2.4)

## 2020-10-30 MED ORDER — MAGNESIUM SULFATE 4 GM/100ML IV SOLN
4.0000 g | Freq: Once | INTRAVENOUS | Status: AC
Start: 2020-10-30 — End: 2020-10-30
  Administered 2020-10-30: 4 g via INTRAVENOUS
  Filled 2020-10-30: qty 100

## 2020-10-30 NOTE — Progress Notes (Signed)
Occupational Therapy Treatment Patient Details Name: [name] MRN: [id number] DOB: 30-Dec-1965 Today's Date: 10/30/2020    History of present illness Pt admitted from home with multiple LE wounds, and FTT and unable to perform basic self care.  Pt reports she stayed in lift chair getting up only twice a day for BM or getting food.  Pt utilizing Rollator around home but pivoted to it from lift chair and wheeled around in seated position.  Pt states she put pads and diapers in chair so she did not have to get up to urinate.  Pt with hx of DM and lymphadema (has been unable to use her pressure sleeves so lymphadema has significantly worsened since last December).   OT comments  Treatment focused on having patient initiate and assist more with bed mobility and tolerating transfer out of bed. Today patient limited by pain - but also participated. ROM/LE movement poorly tolerated and needs total assist for LE movement except for ankle pumps. Able to perform UE movement - with more emphasis on scapular retraction and trunk movement. Patient reports spasms. Max x 2 for rolling and use of maxisky for transfer to recliner. Patient has not made any progress during this POC and goals downgraded.    Follow Up Recommendations  SNF    Equipment Recommendations  None recommended by OT    Recommendations for Other Services      Precautions / Restrictions Precautions Precautions: Fall Precaution Comments: LE wounds Restrictions Weight Bearing Restrictions: No       Mobility Bed Mobility Overal bed mobility: Needs Assistance Bed Mobility: Rolling Rolling: Max assist;+2 for physical assistance;+2 for safety/equipment         General bed mobility comments: max x 2 to roll left and right - verbal cues to have patient initiate movement (reaching for rails) - doesn't assist with LEs.    Transfers                 General transfer comment: Total assist to transfer to recliner and  position LEs.    Balance                                           ADL either performed or assessed with clinical judgement   ADL                                               Vision Patient Visual Report: No change from baseline     Perception     Praxis      Cognition Arousal/Alertness: Awake/alert Behavior During Therapy: WFL for tasks assessed/performed Overall Cognitive Status: Within Functional Limits for tasks assessed                                          Exercises Other Exercises Other Exercises: Shoulder flexion with scapular retraction x 10 prior to movement   Shoulder Instructions       General Comments      Pertinent Vitals/ Pain       Pain Assessment: Faces Faces Pain Scale: Hurts worst Pain Location: Knees, Hips, back Pain Descriptors / Indicators: Contraction;Cramping;Discomfort;Grimacing;Guarding;Crying Pain Intervention(s): Limited activity  within patient's tolerance;RN gave pain meds during session  Home Living                                          Prior Functioning/Environment              Frequency  Min 2X/week        Progress Toward Goals  OT Goals(current goals can now be found in the care plan section)  Progress towards OT goals: Not progressing toward goals - comment;Goals drowngraded-see care plan  Acute Rehab OT Goals Patient Stated Goal: take manage myself OT Goal Formulation: With patient Time For Goal Achievement: 11/13/20 Potential to Achieve Goals: [place] Discharge plan remains appropriate    Co-evaluation    PT/OT/SLP Co-Evaluation/Treatment: Yes Reason for Co-Treatment: For patient/therapist safety PT goals addressed during session: Mobility/safety with mobility OT goals addressed during session: Strengthening/ROM (mobility)      AM-PAC OT "6 Clicks" Daily Activity     Outcome Measure   Help from another person eating  meals?: None Help from another person taking care of personal grooming?: A Little Help from another person toileting, which includes using toliet, bedpan, or urinal?: Total Help from another person bathing (including washing, rinsing, drying)?: A Lot Help from another person to put on and taking off regular upper body clothing?: A Lot Help from another person to put on and taking off regular lower body clothing?: Total 6 Click Score: 13    End of Session    OT Visit Diagnosis: Other abnormalities of gait and mobility (R26.89);Muscle weakness (generalized) (M62.81);Pain   Activity Tolerance Patient limited by pain   Patient Left in chair;with call bell/phone within reach   Nurse Communication Mobility status;Need for lift equipment        Time: 1445-1520 OT Time Calculation (min): 35 min  Charges: OT General Charges $OT Visit: 1 Visit OT Treatments $Therapeutic Activity: 8-22 mins  Derl Barrow, OTR/L [place]  Office [phone] Pager: [place] 10/30/2020, 4:12 PM

## 2020-10-30 NOTE — Progress Notes (Addendum)
PROGRESS NOTE    [name]  PIR:[id number] DOB: [birth year] DOA: 10/10/2020 PCP: Fanny Bien, MD    Chief Complaint  Patient presents with  . Wound Infection    Brief Narrative:  55 year old female morbidly obese type II diabetic with chronic lymphedema chronic nonhealing ulcers on both legs and calves and left upper thigh followed by wound clinic Dr. Asa Saunas was also being followed by home health nurses 3 times a week which has been terminated few weeks ago by insurance her insurance company. Now her wounds have become much larger with increased drainage and pain. She is unable to change the dressings by herself due to the location of the wound and morbid obesity. She does not walk she only moves around in the house in the rolling chair 1-2 times per day. She was not able to get to the restroom so she has been urinating in the bed which has made the wounds even much worse. She lives alone. She describes her pain as 10 out of 10 and has been receiving IV narcotics. Chronic wounds and multiple areas of skin breakdown. When patient came to the ER she had very old dressings still in place which were malodorous and had bloody drainage with chronic skin changes in the periwound area. She received Ancef in the ER, admitted for further management.  She's now off antibiotics.  Currently discharge is pending SNF placement.  Difficult placement given weight.   Assessment & Plan:   Principal Problem:   Wound infection Active Problems:   Hyperkalemia   Chronic peripheral venous hypertension with lower extremity complication   Lymphedema   Obesity   Type 2 diabetes mellitus (HCC)   HTN (hypertension)   Cellulitis   1 chronic bilateral lower extremity wounds/chronic lymphedema -On presentation patient noted to have increased pain, foul-smelling discharge on admission which has since improved.  Wound 100% pink and clean. -Status post 1 day of IV vancomycin and IV  cefepime (10/10/2020>>> 10/11/2020) -Patient seen by wound care with dressing changes recommended. -Lymphedema pumps, PT following. -Continue current pain management with oxycodone, Flexeril. -PT/OT.  2.  Type 2 diabetes mellitus Hemoglobin A1c 6.5 (10/10/2020) -CBG 195 this morning. -Patient not on oral hypoglycemic agents prior to admission and per prior attending refusing to be started on metformin. -Continue SSI.  3.  Hypertension -Continue HCTZ, metoprolol. -Lasix discontinued.  4.  Mildly elevated D-dimer -Patient noted to have a mild tachycardia with heart rate in the low 100s. -D-dimer noted to be mildly elevated. -Lower extremity Dopplers were suboptimal study due to body habitus. -Patient noted to be 2 heavy flow CT scan and as such unable to do a CT angiogram of chest. -Patient however low suspicion for PE as patient denies any chest pain, no significant shortness of breath, not hypoxic. -Continue DVT prophylaxis. Follow-up.  5.  Morbid obesity BMI 73. -Lifestyle modification advised and Dr. Florene Glen discussed extensively with patient on 10/22/2020 about options of weight loss including consultation for bariatric surgery. -Per Dr. Florene Glen patient completely refuses even consultation from bariatric surgery stating to him that she had some friends that passed away after surgery and currently not interested. -Outpatient follow-up.  6.  Hypomagnesemia -Magnesium level of 1.6. -Magnesium sulfate 4 g IV x1.  7.  Pressure injuries, POA Pressure Injury 10/11/20 Thigh Left;Posterior (Active)  10/11/20 0041  Location: Thigh  Location Orientation: Left;Posterior  Staging:   Wound Description (Comments):   Present on Admission:      Pressure Injury 10/11/20 Tibial  Left;Posterior;Proximal (Active)  10/11/20 0041  Location: Tibial  Location Orientation: Left;Posterior;Proximal  Staging:   Wound Description (Comments):   Present on Admission:     #8 disposition -Patient  lives alone and unable to care for herself -Patient noted on presentation to be unable to change the dressings on the wound due to location, morbid obesity, pain. -Patient noted to be nonambulatory. -Patient noted to be moving around in her Rollator couple of times during the day. -Patient noted to be unable to cook for herself as she is unable to stand and subsequently requires placement. -Patient is unsafe discharge back home alone. -Patient with no family close by that could assist with her. -PT following patient and working on schedule with nursing staff for lymphedema pumps. -Out of bed to chair and multiple tilts per day. -TOC consulted for placement.      DVT prophylaxis: Lovenox Code Status: Full Family Communication: Updated patient.  No family at bedside. Disposition:   Status is: Inpatient    Dispo: The patient is from: Home              Anticipated d/c is to: SNF              Patient currently medically stable awaiting for placement.   Difficult to place patient yes       Consultants:   Wound care RN: Para March, RN 10/11/2020  Procedures:   Lower extremity Dopplers 10/23/2020  Chest x-ray 10/16/2020  Antimicrobials:  Anti-infectives (From admission, onward)   Start     Dose/Rate Route Frequency Ordered Stop   10/11/20 1000  vancomycin (VANCOREADY) IVPB 2000 mg/400 mL  Status:  Discontinued        2,000 mg 200 mL/hr over 120 Minutes Intravenous Every 12 hours 10/10/20 2047 10/11/20 1140   10/11/20 0100  ceFEPIme (MAXIPIME) 2 g in sodium chloride 0.9 % 100 mL IVPB  Status:  Discontinued        2 g 200 mL/hr over 30 Minutes Intravenous Every 8 hours 10/10/20 2001 10/11/20 1140   10/10/20 2100  vancomycin (VANCOCIN) 2,500 mg in sodium chloride 0.9 % 500 mL IVPB        2,500 mg 250 mL/hr over 120 Minutes Intravenous  Once 10/10/20 2001 10/11/20 0135   10/10/20 1545  ceFAZolin (ANCEF) IVPB 2g/100 mL premix        2 g 200 mL/hr over 30 Minutes Intravenous   Once 10/10/20 1530 10/10/20 1723        Subjective: Sitting up in bed.  No chest pain.  No shortness of breath.  No abdominal pain.  Still with some lower extremity pain.  Tolerating current diet.  Objective: Vitals:   10/29/20 0514 10/29/20 1311 10/29/20 2135 10/30/20 0531  BP: (!) 148/104 117/66 (!) 150/110 (!) 104/58  Pulse: 97 97 (!) 105 (!) 104  Resp: 17 20 17 17   Temp: 98 F (36.7 C)  98.5 F (36.9 C) 98.1 F (36.7 C)  TempSrc: Oral     SpO2: 91% 93% 92% 95%  Weight:      Height:        Intake/Output Summary (Last 24 hours) at 10/30/2020 1306 Last data filed at 10/30/2020 1231 Gross per 24 hour  Intake 750 ml  Output 1200 ml  Net -450 ml   Filed Weights   10/10/20 1954 10/22/20 1531  Weight: (!) 233.6 kg (!) 216.5 kg    Examination:  General exam: Appears calm and comfortable  Respiratory system: Clear to  auscultation anterior lung fields.  No wheezes, no crackles, no rhonchi.Marland Kitchen Respiratory effort normal. Cardiovascular system: S1 & S2 heard, RRR. No JVD, murmurs, rubs, gallops or clicks. No pedal edema. Gastrointestinal system: Abdomen is nondistended, soft and nontender. No organomegaly or masses felt. Normal bowel sounds heard. Central nervous system: Alert and oriented. No focal neurological deficits. Extremities: Bilateral lymphedema.   Skin: Bilateral lower extremity with chronic venous stasis changes.  Psychiatry: Judgement and insight appear normal. Mood & affect appropriate.     Data Reviewed: I have personally reviewed following labs and imaging studies  CBC: Recent Labs  Lab 10/23/20 1308 10/25/20 0256 10/28/20 0308 10/30/20 0327  WBC 8.3 8.5 7.2 6.2  NEUTROABS 5.7 5.1 3.8 3.4  HGB 14.2 13.5 13.1 13.2  HCT 46.4* 44.7 43.6 44.2  MCV 96.7 96.3 96.9 97.8  PLT 327 300 290 433    Basic Metabolic Panel: Recent Labs  Lab 10/23/20 1308 10/25/20 0256 10/28/20 0308 10/30/20 0327  NA 139 138 136 136  K 3.9 3.6 3.7 3.5  CL 100 100 97* 100   CO2 26 25 30 25   GLUCOSE 213* 176* 187* 200*  BUN 19 22* 25* 22*  CREATININE 0.91 1.01* 0.87 0.76  CALCIUM 9.7 9.5 9.4 9.3  MG 1.4*  --   --  1.6*  PHOS  --   --   --  4.8*    GFR: Estimated Creatinine Clearance: 165.8 mL/min (by C-G formula based on SCr of 0.76 mg/dL).  Liver Function Tests: Recent Labs  Lab 10/23/20 1308 10/30/20 0327  AST 26 17  ALT 21 15  ALKPHOS 66 63  BILITOT 0.9 0.8  PROT 8.7* 7.7  ALBUMIN 3.8 3.4*    CBG: Recent Labs  Lab 10/29/20 1135 10/29/20 1727 10/29/20 2131 10/30/20 0806 10/30/20 1154  GLUCAP 198* 169* 172* 195* 200*     No results found for this or any previous visit (from the past 240 hour(s)).       Radiology Studies: No results found.      Scheduled Meds: . acetaminophen  1,000 mg Oral QHS  . enoxaparin (LOVENOX) injection  100 mg Subcutaneous Q24H  . ferrous sulfate  325 mg Oral Daily  . hydrochlorothiazide  12.5 mg Oral Daily  . hydrocortisone cream   Topical BID  . hydrOXYzine  10 mg Oral TID  . insulin aspart  0-20 Units Subcutaneous TID WC  . insulin aspart  0-5 Units Subcutaneous QHS  . magnesium oxide  400 mg Oral Daily  . metoprolol tartrate  12.5 mg Oral BID  . polyethylene glycol  17 g Oral Daily  . potassium chloride  20 mEq Oral Daily  . senna-docusate  1 tablet Oral BID   Continuous Infusions: . sodium chloride 250 mL (10/15/20 2301)     LOS: 13 days    Time spent: 35 minutes    Irine Seal, MD [facility]   To contact the attending provider between 7A-7P or the covering provider during after hours 7P-7A, please log into the web site [email or web address] and access using universal [place] password for that web site. If you do not have the password, please call the hospital operator.  10/30/2020, 1:06 PM

## 2020-10-30 NOTE — Progress Notes (Signed)
Physical Therapy Treatment Patient Details Name: [name] MRN: [id number] DOB: [birth year] Today's Date: 10/30/2020    History of Present Illness Pt admitted from home with multiple LE wounds, and FTT and unable to perform basic self care.  Pt reports she stayed in lift chair getting up only twice a day for BM or getting food.  Pt utilizing Rollator around home but pivoted to it from lift chair and wheeled around in seated position.  Pt states she put pads and diapers in chair so she did not have to get up to urinate.  Pt with hx of DM and lymphadema (has been unable to use her pressure sleeves so lymphadema has significantly worsened since last December).    PT Comments    Pt continues to require maximum encouragement to participate and self assist. Pt is able to perform more reaching and initiate roll with UB to self assist with cues, unable to assist with LB.  Pt tearful with c/o pain and requesting frequent rests. Pt advised that we must continue to move and work through pain and that this is not going to be a comfortable process. OOB to chair with maxi sky. Will continue to follow. Continue to recommend SNF   Follow Up Recommendations  SNF     Equipment Recommendations  None recommended by PT    Recommendations for Other Services       Precautions / Restrictions Precautions Precautions: Fall Precaution Comments: LE wounds Restrictions Weight Bearing Restrictions: No    Mobility  Bed Mobility Overal bed mobility: Needs Assistance Bed Mobility: Rolling Rolling: Max assist;+2 for physical assistance;+2 for safety/equipment         General bed mobility comments: max x 2 to roll left and right - verbal cues to have patient initiate movement (reaching for rails) - doesn't assist with LEs.    Transfers                 General transfer comment: Total assist to transfer to recliner and position LEs. able to hold head up  Ambulation/Gait                  Stairs             Wheelchair Mobility    Modified Rankin (Stroke Patients Only)       Balance                                            Cognition Arousal/Alertness: Awake/alert Behavior During Therapy: WFL for tasks assessed/performed Overall Cognitive Status: Within Functional Limits for tasks assessed                                        Exercises General Exercises - Lower Extremity Ankle Circles/Pumps: AROM;Both;10 reps;Limitations Ankle Circles/Pumps Limitations: requires heavy cues to participate Other Exercises Other Exercises: Shoulder flexion with scapular retraction x 10 prior to movement    General Comments        Pertinent Vitals/Pain Pain Assessment: Faces Faces Pain Scale: Hurts worst Pain Location: Knees, Hips, back Pain Descriptors / Indicators: Contraction;Cramping;Discomfort;Grimacing;Guarding;Crying Pain Intervention(s): Limited activity within patient's tolerance;Monitored during session;Premedicated before session;Repositioned    Home Living  Prior Function            PT Goals (current goals can now be found in the care plan section) Acute Rehab PT Goals Patient Stated Goal: take manage myself PT Goal Formulation: With patient Time For Goal Achievement: 11/07/20 Potential to Achieve Goals: Fair Progress towards PT goals: Progressing toward goals    Frequency    Min 2X/week      PT Plan Current plan remains appropriate    Co-evaluation   Reason for Co-Treatment: For patient/therapist safety PT goals addressed during session: Mobility/safety with mobility OT goals addressed during session: Strengthening/ROM (mobility)      AM-PAC PT "6 Clicks" Mobility   Outcome Measure  Help needed turning from your back to your side while in a flat bed without using bedrails?: Total Help needed moving from lying on your back to sitting on the side of a flat bed  without using bedrails?: Total Help needed moving to and from a bed to a chair (including a wheelchair)?: Total Help needed standing up from a chair using your arms (e.g., wheelchair or bedside chair)?: Total Help needed to walk in hospital room?: Total Help needed climbing 3-5 steps with a railing? : Total 6 Click Score: 6    End of Session Equipment Utilized During Treatment: Other (comment) (maxisky) Activity Tolerance: Patient limited by pain;Other (comment) (weakness, morbid obesity) Patient left: in chair;with call bell/phone within reach   PT Visit Diagnosis: Muscle weakness (generalized) (M62.81);Difficulty in walking, not elsewhere classified (R26.2);Pain;Adult, failure to thrive (R62.7) Pain - Right/Left: Left Pain - part of body: Knee;Leg;Hip (bil)     Time: 6834-1962 PT Time Calculation (min) (ACUTE ONLY): 26 min  Charges:  $Therapeutic Activity: 8-22 mins                     Baxter Flattery, PT  Acute Rehab Dept ([place]) [phone] Pager [phone]  10/30/2020    [facility] 10/30/2020, 4:18 PM

## 2020-10-31 DIAGNOSIS — I890 Lymphedema, not elsewhere classified: Secondary | ICD-10-CM | POA: Diagnosis not present

## 2020-10-31 DIAGNOSIS — I10 Essential (primary) hypertension: Secondary | ICD-10-CM | POA: Diagnosis not present

## 2020-10-31 DIAGNOSIS — I87399 Chronic venous hypertension (idiopathic) with other complications of unspecified lower extremity: Secondary | ICD-10-CM | POA: Diagnosis not present

## 2020-10-31 DIAGNOSIS — T148XXA Other injury of unspecified body region, initial encounter: Secondary | ICD-10-CM | POA: Diagnosis not present

## 2020-10-31 LAB — GLUCOSE, CAPILLARY
Glucose-Capillary: 193 mg/dL — ABNORMAL HIGH (ref 70–99)
Glucose-Capillary: 197 mg/dL — ABNORMAL HIGH (ref 70–99)
Glucose-Capillary: 180 mg/dL — ABNORMAL HIGH (ref 70–99)
Glucose-Capillary: 202 mg/dL — ABNORMAL HIGH (ref 70–99)

## 2020-10-31 LAB — CBC WITH DIFFERENTIAL/PLATELET
Abs Immature Granulocytes: 0.02 10*3/uL (ref 0.00–0.07)
Basophils Absolute: 0 10*3/uL (ref 0.0–0.1)
Basophils Relative: 0 %
Eosinophils Absolute: 0.4 10*3/uL (ref 0.0–0.5)
Eosinophils Relative: 5 %
HCT: 46 % (ref 36.0–46.0)
Hemoglobin: 13.6 g/dL (ref 12.0–15.0)
Immature Granulocytes: 0 %
Lymphocytes Relative: 28 %
Lymphs Abs: 2.1 10*3/uL (ref 0.7–4.0)
MCH: 29.1 pg (ref 26.0–34.0)
MCHC: 29.6 g/dL — ABNORMAL LOW (ref 30.0–36.0)
MCV: 98.3 fL (ref 80.0–100.0)
Monocytes Absolute: 0.6 10*3/uL (ref 0.1–1.0)
Monocytes Relative: 8 %
Neutro Abs: 4.5 10*3/uL (ref 1.7–7.7)
Neutrophils Relative %: 59 %
Platelets: 279 10*3/uL (ref 150–400)
RBC: 4.68 MIL/uL (ref 3.87–5.11)
RDW: 13.3 % (ref 11.5–15.5)
WBC: 7.6 10*3/uL (ref 4.0–10.5)
nRBC: 0 % (ref 0.0–0.2)

## 2020-10-31 LAB — BASIC METABOLIC PANEL
Anion gap: 6 (ref 5–15)
BUN: 22 mg/dL — ABNORMAL HIGH (ref 6–20)
CO2: 31 mmol/L (ref 22–32)
Calcium: 9.5 mg/dL (ref 8.9–10.3)
Chloride: 99 mmol/L (ref 98–111)
Creatinine, Ser: 0.78 mg/dL (ref 0.44–1.00)
GFR, Estimated: >60 mL/min (ref >60)
Glucose, Bld: 200 mg/dL — ABNORMAL HIGH (ref 70–99)
Potassium: 3.4 mmol/L — ABNORMAL LOW (ref 3.5–5.1)
Sodium: 136 mmol/L (ref 135–145)

## 2020-10-31 LAB — MAGNESIUM: Magnesium: 1.9 mg/dL (ref 1.7–2.4)

## 2020-10-31 MED ORDER — INSULIN GLARGINE 100 UNIT/ML ~~LOC~~ SOLN
10.0000 [IU] | Freq: Every day | SUBCUTANEOUS | Status: DC
  Administered 2020-10-31 – 2020-11-01 (×2): 10 [IU] via SUBCUTANEOUS
  Filled 2020-10-31 (×2): qty 0.1

## 2020-10-31 NOTE — Progress Notes (Signed)
Physical Therapy Treatment Patient Details Name: [name] MRN: [id number] DOB: 1966/01/31 Today's Date: 10/31/2020    History of Present Illness Pt admitted from home with multiple LE wounds, and FTT and unable to perform basic self care.  Pt reports she stayed in lift chair getting up only twice a day for BM or getting food.  Pt utilizing Rollator around home but pivoted to it from lift chair and wheeled around in seated position.  Pt states she put pads and diapers in chair so she did not have to get up to urinate.  Pt with hx of DM and lymphadema (has been unable to use her pressure sleeves so lymphadema has significantly worsened since last December).    PT Comments    Pt tolerated sitting up in recliner for 3 hours with less pain. Hoyered back to bed, tolerated rolling for pad removal, then tilt performed. While tilting pt was able to release both arms  ( one at a time ) for sock toss for 10 minutes at 37 degrees, and last 5 minutes at 47 degrees with 150 lb weight bearing at this time. BLE began to shake a lot and pt could only tolerate 5 minutes at this angle today.    Follow Up Recommendations  SNF     Equipment Recommendations  None recommended by PT    Recommendations for Other Services OT consult     Precautions / Restrictions Precautions Precautions: Fall Precaution Comments: LE wounds Restrictions Weight Bearing Restrictions: No    Mobility  Bed Mobility Overal bed mobility: Needs Assistance Bed Mobility: Rolling Rolling: Max assist;+2 for physical assistance;+2 for safety/equipment         General bed mobility comments: max x 2 to roll left and right - pt initiated own use and help with UEs, and able to reach a little further towards handrails today - doesn't assist with LEs. Start Time: 70 Angle: 37 degrees (42 degrees for 5 minutes , LEs began to shake a lot so had to lower down, weight bearing 85 kg) Total Minutes in Angle: 18  minutes  Transfers Overall transfer level: Needs assistance               General transfer comment: Total assist to transfer to recliner and position LEs. able to hold head up. This takes 2 people now, one to assist holding LES during lift and other to control patient and placement.           Cognition Arousal/Alertness: Awake/alert Behavior During Therapy: WFL for tasks assessed/performed Overall Cognitive Status: Within Functional Limits for tasks assessed                                        Exercises      General Comments        Pertinent Vitals/Pain Pain Assessment: 0-10 Pain Score: 5  Pain Location: Knees, Hips, back, however tolerating spasms better today and a little less pain with movment today as well Pain Descriptors / Indicators: Spasm;Cramping Pain Intervention(s): Monitored during session    Home Living                      Prior Function            PT Goals (current goals can now be found in the care plan section) Acute Rehab PT Goals Patient Stated Goal: take manage myself  Time For Goal Achievement: 11/07/20 Potential to Achieve Goals: Fair Additional Goals Additional Goal #1: tolerate tilt standing bed x 20 mins at 50 degrees. Progress towards PT goals: Progressing toward goals (slowly)    Frequency    Min 2X/week      PT Plan Current plan remains appropriate    Co-evaluation              AM-PAC PT "6 Clicks" Mobility   Outcome Measure  Help needed turning from your back to your side while in a flat bed without using bedrails?: Total Help needed moving from lying on your back to sitting on the side of a flat bed without using bedrails?: Total Help needed moving to and from a bed to a chair (including a wheelchair)?: Total Help needed standing up from a chair using your arms (e.g., wheelchair or bedside chair)?: Total Help needed to walk in hospital room?: Total Help needed climbing 3-5 steps  with a railing? : Total 6 Click Score: 6    End of Session Equipment Utilized During Treatment: Other (comment) Activity Tolerance: Patient limited by pain;Other (comment) Patient left: in chair;with call bell/phone within reach Nurse Communication: Other (comment) PT Visit Diagnosis: Muscle weakness (generalized) (M62.81);Difficulty in walking, not elsewhere classified (R26.2);Pain;Adult, failure to thrive (R62.7) Pain - Right/Left: Left Pain - part of body: Knee;Leg;Hip     Time: 1620-1650 PT Time Calculation (min) (ACUTE ONLY): 30 min  Charges:  $Therapeutic Exercise: 8-22 mins $Therapeutic Activity: 8-22 mins                     Donaciano Range, PT, MPT Acute Rehabilitation Services Office: [phone] Pager: [phone] 10/31/2020    [name] 10/31/2020, 8:48 PM

## 2020-10-31 NOTE — Progress Notes (Signed)
Physical Therapy Treatment Patient Details Name: [name] MRN: [id number] DOB: [birth year] Today's Date: 10/31/2020    History of Present Illness Pt admitted from home with multiple LE wounds, and FTT and unable to perform basic self care.  Pt reports she stayed in lift chair getting up only twice a day for BM or getting food.  Pt utilizing Rollator around home but pivoted to it from lift chair and wheeled around in seated position.  Pt states she put pads and diapers in chair so she did not have to get up to urinate.  Pt with hx of DM and lymphadema (has been unable to use her pressure sleeves so lymphadema has significantly worsened since last December).    PT Comments    Developed a schedule with pt yesterday, she she is aware of all the activities occurring during the day. Worked with rolling R and L and hoyer out of bed to recliner for increased sitting tolerance. Pt is able to tolerate and assist with rolling a little more with initiating upper body and fewer stops for pain.  She used to be only able to tolerate 1 roll a day, now they are a little quicker and she is tolerating up to 3-4 rolls per day. [name] is also more tolerable as the recliner is as well. She sat for 3 hours today up in recliner comfortably. She still is very limited with LEs , very stiff and very little ( 10 -20 degrees of movement  PROM/AAROM at hips and knees).    Follow Up Recommendations  SNF     Equipment Recommendations  None recommended by PT    Recommendations for Other Services OT consult     Precautions / Restrictions Precautions Precautions: Fall Precaution Comments: LE wounds Restrictions Weight Bearing Restrictions: No    Mobility  Bed Mobility Overal bed mobility: Needs Assistance Bed Mobility: Rolling Rolling: Max assist;+2 for physical assistance;+2 for safety/equipment         General bed mobility comments: max x 2 to roll left and right - pt initiated own use and help with  UEs, and able to reach a little further towards handrails today - doesn't assist with LEs.    Transfers Overall transfer level: Needs assistance               General transfer comment: Total assist to transfer to recliner and position LEs. able to hold head up. This takes 2 people now, one to assist holding LES during lift and other to control patient and placement.            Cognition   Exercises   General Comments        Pertinent Vitals/Pain Pain Assessment: 0-10 Pain Score: 5  Pain Location: Knees, Hips, back, however tolerating spasms better today and a little less pain with movment today as well Pain Descriptors / Indicators: Spasm;Cramping Pain Intervention(s): Monitored during session    Home Living                      Prior Function            PT Goals (current goals can now be found in the care plan section) Acute Rehab PT Goals Patient Stated Goal: take manage myself Time For Goal Achievement: 11/07/20 Potential to Achieve Goals: Fair Additional Goals Additional Goal #1: tolerate tilt standing bed x 20 mins at 50 degrees. Progress towards PT goals: Progressing toward goals (slowly)    Frequency  Min 2X/week      PT Plan Current plan remains appropriate    Co-evaluation              AM-PAC PT "6 Clicks" Mobility   Outcome Measure  Help needed turning from your back to your side while in a flat bed without using bedrails?: Total Help needed moving from lying on your back to sitting on the side of a flat bed without using bedrails?: Total Help needed moving to and from a bed to a chair (including a wheelchair)?: Total Help needed standing up from a chair using your arms (e.g., wheelchair or bedside chair)?: Total Help needed to walk in hospital room?: Total Help needed climbing 3-5 steps with a railing? : Total 6 Click Score: 6    End of Session Equipment Utilized During Treatment: Other (comment) Activity Tolerance:  Patient limited by pain;Other (comment) Patient left: in chair;with call bell/phone within reach Nurse Communication: Other (comment) PT Visit Diagnosis: Muscle weakness (generalized) (M62.81);Difficulty in walking, not elsewhere classified (R26.2);Pain;Adult, failure to thrive (R62.7) Pain - Right/Left: Left Pain - part of body: Knee;Leg;Hip     Time: 1130-1155 PT Time Calculation (min) (ACUTE ONLY): 25 min  Charges:  $Therapeutic Activity: 23-37 mins                     [name], PT, MPT Acute Rehabilitation Services Office: [phone] Pager: [phone] 10/31/2020    [name] 10/31/2020, 8:28 PM

## 2020-10-31 NOTE — TOC Progression Note (Signed)
Transition of Care [facility]) - Progression Note    Patient Details  Name: [name] MRN: [id number] Date of Birth: Jun 06, 1966  Transition of Care [facility]) CM/SW Contact  Lennart Pall, LCSW Phone Number: 10/31/2020, 11:14 AM  Clinical Narrative:    Touching base with pt today about continued search for SNF bed.  Pt remains very pleasant and agreeable with plan but does admit she had "a tough time yesterday... a few tears...".  Talks about her frustration with her situation overall and notes, "I know I created it though." She, also, admits to some anxiety with the uncertainty of where she may be discharged to.  Encouraged pt to talk openly about these things and she was a little tearful.  She does feel  optimistic about the plan developed with therapy and nursingstaff to ensure regular movement and tx sessions as a "mini rehab" program.  TOC continues to follow.   Expected Discharge Plan: [place] Barriers to Discharge: Continued Medical Work up  Expected Discharge Plan and Services Expected Discharge Plan: [place] In-house Referral: Clinical Social Work Discharge Planning Services: CM Consult Post Acute Care Choice: [place] Living arrangements for the past 2 months: Apartment                                       Social Determinants of Health (SDOH) Interventions    Readmission Risk Interventions No flowsheet data found.

## 2020-10-31 NOTE — Progress Notes (Signed)
PROGRESS NOTE    [name]  WUJ:[id number] DOB: 11-22-1965 DOA: 10/10/2020 PCP: Fanny Bien, MD    Chief Complaint  Patient presents with  . Wound Infection    Brief Narrative:  55 year old female morbidly obese type II diabetic with chronic lymphedema chronic nonhealing ulcers on both legs and calves and left upper thigh followed by wound clinic Dr. Asa Saunas was also being followed by home health nurses 3 times a week which has been terminated few weeks ago by insurance her insurance company. Now her wounds have become much larger with increased drainage and pain. She is unable to change the dressings by herself due to the location of the wound and morbid obesity. She does not walk she only moves around in the house in the rolling chair 1-2 times per day. She was not able to get to the restroom so she has been urinating in the bed which has made the wounds even much worse. She lives alone. She describes her pain as 10 out of 10 and has been receiving IV narcotics. Chronic wounds and multiple areas of skin breakdown. When patient came to the ER she had very old dressings still in place which were malodorous and had bloody drainage with chronic skin changes in the periwound area. She received Ancef in the ER, admitted for further management.  She's now off antibiotics.  Currently discharge is pending SNF placement.  Difficult placement given weight.   Assessment & Plan:   Principal Problem:   Wound infection Active Problems:   Hyperkalemia   Chronic peripheral venous hypertension with lower extremity complication   Lymphedema   Obesity   Type 2 diabetes mellitus (HCC)   HTN (hypertension)   Cellulitis   1 chronic bilateral lower extremity wounds/chronic lymphedema -On presentation patient noted to have increased pain, foul-smelling discharge on admission which has since improved.  Wound 100% pink and clean. -Status post 1 day of IV vancomycin and IV  cefepime (10/10/2020>>> 10/11/2020) -Patient seen by wound care with dressing changes recommended. -Lymphedema pumps, PT following. -Continue current pain management with oxycodone, Flexeril. -PT/OT.  2.  Type 2 diabetes mellitus Hemoglobin A1c 6.5 (10/10/2020) -CBG 193 this morning. -Patient not on oral hypoglycemic agents prior to admission and per prior attending refusing to be started on metformin. -Start Lantus 10 units daily. -SSI.  3.  Hypertension -Continue HCTZ, metoprolol.  -Lasix discontinued.   4.  Mildly elevated D-dimer -Patient noted to have a mild tachycardia with heart rate in the low 100s. -D-dimer noted to be mildly elevated. -Lower extremity Dopplers were suboptimal study due to body habitus. -Patient noted to be 2 heavy flow CT scan and as such unable to do a CT angiogram of chest. -Patient however low suspicion for PE as patient denies any chest pain, no significant shortness of breath, not hypoxic. -Continue DVT prophylaxis. Follow-up.  5.  Morbid obesity BMI 73. -Lifestyle modification advised and Dr. Florene Glen discussed extensively with patient on 10/22/2020 about options of weight loss including consultation for bariatric surgery. -Per Dr. Florene Glen patient completely refuses even consultation from bariatric surgery stating to him that she had some friends that passed away after surgery and currently not interested. -Outpatient follow-up.  6.  Hypomagnesemia -Repleted.  Magnesium at 1.9.   -Follow.   7.  Pressure injuries, POA Pressure Injury 10/11/20 Thigh Left;Posterior (Active)  10/11/20 0041  Location: Thigh  Location Orientation: Left;Posterior  Staging:   Wound Description (Comments):   Present on Admission:  Pressure Injury 10/11/20 Tibial Left;Posterior;Proximal (Active)  10/11/20 0041  Location: Tibial  Location Orientation: Left;Posterior;Proximal  Staging:   Wound Description (Comments):   Present on Admission:     #8  disposition -Patient lives alone and unable to care for herself -Patient noted on presentation to be unable to change the dressings on the wound due to location, morbid obesity, pain. -Patient noted to be nonambulatory. -Patient noted to be moving around in her Rollator couple of times during the day. -Patient noted to be unable to cook for herself as she is unable to stand and subsequently requires placement. -Patient is unsafe discharge back home alone. -Patient with no family close by that could assist with her. -PT following patient and working on schedule with nursing staff for lymphedema pumps. -Out of bed to chair and multiple tilts per day. -TOC consulted for placement.      DVT prophylaxis: Lovenox Code Status: Full Family Communication: Updated patient.  No family at bedside. Disposition:   Status is: Inpatient    Dispo: The patient is from: Home              Anticipated d/c is to: SNF              Patient currently medically stable, awaiting placement.    Difficult to place patient yes       Consultants:   Wound care RN: Para March, RN 10/11/2020  Procedures:   Lower extremity Dopplers 10/23/2020  Chest x-ray 10/16/2020  Antimicrobials:  Anti-infectives (From admission, onward)   Start     Dose/Rate Route Frequency Ordered Stop   10/11/20 1000  vancomycin (VANCOREADY) IVPB 2000 mg/400 mL  Status:  Discontinued        2,000 mg 200 mL/hr over 120 Minutes Intravenous Every 12 hours 10/10/20 2047 10/11/20 1140   10/11/20 0100  ceFEPIme (MAXIPIME) 2 g in sodium chloride 0.9 % 100 mL IVPB  Status:  Discontinued        2 g 200 mL/hr over 30 Minutes Intravenous Every 8 hours 10/10/20 2001 10/11/20 1140   10/10/20 2100  vancomycin (VANCOCIN) 2,500 mg in sodium chloride 0.9 % 500 mL IVPB        2,500 mg 250 mL/hr over 120 Minutes Intravenous  Once 10/10/20 2001 10/11/20 0135   10/10/20 1545  ceFAZolin (ANCEF) IVPB 2g/100 mL premix        2 g 200 mL/hr over 30  Minutes Intravenous  Once 10/10/20 1530 10/10/20 1723       Subjective: Sitting up in bed.  No shortness of breath, no chest pain, no abdominal pain.  Tolerating diet.  States gets around with the aid of a Rollator.   Objective: Vitals:   10/30/20 1359 10/30/20 2057 10/31/20 0557 10/31/20 1352  BP: 104/79 (!) 112/49 (!) 158/107 (!) 151/68  Pulse: 96 94 65 96  Resp: 15 16 16 20   Temp: 98.3 F (36.8 C) 98.6 F (37 C) 98.3 F (36.8 C) 98.4 F (36.9 C)  TempSrc: Oral Oral  Oral  SpO2: 95% 95% 95% 95%  Weight:      Height:        Intake/Output Summary (Last 24 hours) at 10/31/2020 1619 Last data filed at 10/31/2020 1245 Gross per 24 hour  Intake 1247 ml  Output 150 ml  Net 1097 ml   Filed Weights   10/10/20 1954 10/22/20 1531  Weight: (!) 233.6 kg (!) 216.5 kg    Examination:  General exam: NAD Respiratory system: Lungs  clear to auscultation bilaterally anterior lung fields.  No wheezes, no crackles, no rhonchi.  Normal respiratory effort.   Cardiovascular system: RRR no murmurs rubs or gallops.  No JVD.  No lower extremity edema. Gastrointestinal system: Abdomen is obese, soft, nontender, nondistended, positive bowel sounds.  No rebound.  No guarding.  Central nervous system: Alert and oriented. No focal neurological deficits. Extremities: Bilateral lymphedema.   Skin: Bilateral lower extremity with chronic venous stasis changes.  Psychiatry: Judgement and insight appear normal. Mood & affect appropriate.     Data Reviewed: I have personally reviewed following labs and imaging studies  CBC: Recent Labs  Lab 10/25/20 0256 10/28/20 0308 10/30/20 0327 10/31/20 0315  WBC 8.5 7.2 6.2 7.6  NEUTROABS 5.1 3.8 3.4 4.5  HGB 13.5 13.1 13.2 13.6  HCT 44.7 43.6 44.2 46.0  MCV 96.3 96.9 97.8 98.3  PLT 300 290 271 263    Basic Metabolic Panel: Recent Labs  Lab 10/25/20 0256 10/28/20 0308 10/30/20 0327 10/31/20 0315  NA 138 136 136 136  K 3.6 3.7 3.5 3.4*  CL 100  97* 100 99  CO2 25 30 25 31   GLUCOSE 176* 187* 200* 200*  BUN 22* 25* 22* 22*  CREATININE 1.01* 0.87 0.76 0.78  CALCIUM 9.5 9.4 9.3 9.5  MG  --   --  1.6* 1.9  PHOS  --   --  4.8*  --     GFR: Estimated Creatinine Clearance: 165.8 mL/min (by C-G formula based on SCr of 0.78 mg/dL).  Liver Function Tests: Recent Labs  Lab 10/30/20 0327  AST 17  ALT 15  ALKPHOS 63  BILITOT 0.8  PROT 7.7  ALBUMIN 3.4*    CBG: Recent Labs  Lab 10/30/20 1154 10/30/20 1659 10/30/20 2112 10/31/20 0746 10/31/20 1300  GLUCAP 200* 203* 186* 193* 197*     No results found for this or any previous visit (from the past 240 hour(s)).       Radiology Studies: No results found.      Scheduled Meds: . acetaminophen  1,000 mg Oral QHS  . enoxaparin (LOVENOX) injection  100 mg Subcutaneous Q24H  . ferrous sulfate  325 mg Oral Daily  . hydrochlorothiazide  12.5 mg Oral Daily  . hydrocortisone cream   Topical BID  . hydrOXYzine  10 mg Oral TID  . insulin aspart  0-20 Units Subcutaneous TID WC  . insulin aspart  0-5 Units Subcutaneous QHS  . insulin glargine  10 Units Subcutaneous Daily  . magnesium oxide  400 mg Oral Daily  . metoprolol tartrate  12.5 mg Oral BID  . polyethylene glycol  17 g Oral Daily  . potassium chloride  20 mEq Oral Daily  . senna-docusate  1 tablet Oral BID   Continuous Infusions: . sodium chloride 250 mL (10/15/20 2301)     LOS: 14 days    Time spent: 35 minutes    Irine Seal, MD [facility]   To contact the attending provider between 7A-7P or the covering provider during after hours 7P-7A, please log into the web site [email or web address] and access using universal [place] password for that web site. If you do not have the password, please call the hospital operator.  10/31/2020, 4:19 PM

## 2020-11-01 DIAGNOSIS — T148XXA Other injury of unspecified body region, initial encounter: Secondary | ICD-10-CM | POA: Diagnosis not present

## 2020-11-01 DIAGNOSIS — I87399 Chronic venous hypertension (idiopathic) with other complications of unspecified lower extremity: Secondary | ICD-10-CM | POA: Diagnosis not present

## 2020-11-01 DIAGNOSIS — I890 Lymphedema, not elsewhere classified: Secondary | ICD-10-CM | POA: Diagnosis not present

## 2020-11-01 DIAGNOSIS — I10 Essential (primary) hypertension: Secondary | ICD-10-CM | POA: Diagnosis not present

## 2020-11-01 LAB — BASIC METABOLIC PANEL
Anion gap: 11 (ref 5–15)
BUN: 19 mg/dL (ref 6–20)
CO2: 26 mmol/L (ref 22–32)
Calcium: 9.4 mg/dL (ref 8.9–10.3)
Chloride: 98 mmol/L (ref 98–111)
Creatinine, Ser: 0.9 mg/dL (ref 0.44–1.00)
GFR, Estimated: >60 mL/min (ref >60)
Glucose, Bld: 185 mg/dL — ABNORMAL HIGH (ref 70–99)
Potassium: 3.3 mmol/L — ABNORMAL LOW (ref 3.5–5.1)
Sodium: 135 mmol/L (ref 135–145)

## 2020-11-01 LAB — GLUCOSE, CAPILLARY
Glucose-Capillary: 192 mg/dL — ABNORMAL HIGH (ref 70–99)
Glucose-Capillary: 209 mg/dL — ABNORMAL HIGH (ref 70–99)
Glucose-Capillary: 193 mg/dL — ABNORMAL HIGH (ref 70–99)
Glucose-Capillary: 194 mg/dL — ABNORMAL HIGH (ref 70–99)

## 2020-11-01 MED ORDER — INSULIN ASPART 100 UNIT/ML IJ SOLN: 0.0000 [IU] | Freq: Every day | INTRAMUSCULAR | Status: DC

## 2020-11-01 MED ORDER — POTASSIUM CHLORIDE CRYS ER 20 MEQ PO TBCR
40.0000 meq | EXTENDED_RELEASE_TABLET | Freq: Every day | ORAL | Status: DC
  Administered 2020-11-01 – 2020-11-28 (×28): 40 meq via ORAL
  Filled 2020-11-01 (×29): qty 2

## 2020-11-01 MED ORDER — INSULIN ASPART 100 UNIT/ML IJ SOLN
0.0000 [IU] | Freq: Three times a day (TID) | INTRAMUSCULAR | Status: DC
  Administered 2020-11-01: 4 [IU] via SUBCUTANEOUS
  Administered 2020-11-01: 7 [IU] via SUBCUTANEOUS
  Administered 2020-11-02: 4 [IU] via SUBCUTANEOUS
  Administered 2020-11-02: 7 [IU] via SUBCUTANEOUS
  Administered 2020-11-02 – 2020-11-06 (×10): 4 [IU] via SUBCUTANEOUS
  Administered 2020-11-06: 3 [IU] via SUBCUTANEOUS
  Administered 2020-11-07 – 2020-11-10 (×11): 4 [IU] via SUBCUTANEOUS
  Administered 2020-11-11: 7 [IU] via SUBCUTANEOUS
  Administered 2020-11-11: 4 [IU] via SUBCUTANEOUS
  Administered 2020-11-11: 3 [IU] via SUBCUTANEOUS
  Administered 2020-11-12 (×3): 4 [IU] via SUBCUTANEOUS
  Administered 2020-11-13: 7 [IU] via SUBCUTANEOUS
  Administered 2020-11-13 – 2020-11-15 (×6): 4 [IU] via SUBCUTANEOUS
  Administered 2020-11-15: 3 [IU] via SUBCUTANEOUS
  Administered 2020-11-15 – 2020-11-16 (×4): 4 [IU] via SUBCUTANEOUS
  Administered 2020-11-17: 3 [IU] via SUBCUTANEOUS
  Administered 2020-11-17 (×2): 4 [IU] via SUBCUTANEOUS
  Administered 2020-11-18: 3 [IU] via SUBCUTANEOUS
  Administered 2020-11-18: 4 [IU] via SUBCUTANEOUS
  Administered 2020-11-18: 7 [IU] via SUBCUTANEOUS
  Administered 2020-11-19: 4 [IU] via SUBCUTANEOUS
  Administered 2020-11-19: 3 [IU] via SUBCUTANEOUS
  Administered 2020-11-19 – 2020-11-20 (×3): 4 [IU] via SUBCUTANEOUS
  Administered 2020-11-20: 3 [IU] via SUBCUTANEOUS
  Administered 2020-11-21 (×2): 4 [IU] via SUBCUTANEOUS
  Administered 2020-11-22 (×2): 3 [IU] via SUBCUTANEOUS
  Administered 2020-11-22: 4 [IU] via SUBCUTANEOUS
  Administered 2020-11-23 (×2): 3 [IU] via SUBCUTANEOUS
  Administered 2020-11-23 – 2020-11-24 (×2): 4 [IU] via SUBCUTANEOUS
  Administered 2020-11-24: 3 [IU] via SUBCUTANEOUS
  Administered 2020-11-24 – 2020-11-25 (×3): 4 [IU] via SUBCUTANEOUS
  Administered 2020-11-25 – 2020-11-26 (×2): 3 [IU] via SUBCUTANEOUS
  Administered 2020-11-26: 4 [IU] via SUBCUTANEOUS

## 2020-11-01 MED ORDER — INSULIN GLARGINE 100 UNIT/ML ~~LOC~~ SOLN
4.0000 [IU] | Freq: Once | SUBCUTANEOUS | Status: AC
  Administered 2020-11-01: 4 [IU] via SUBCUTANEOUS
  Filled 2020-11-01: qty 0.04

## 2020-11-01 MED ORDER — INSULIN GLARGINE 100 UNIT/ML ~~LOC~~ SOLN
14.0000 [IU] | Freq: Every day | SUBCUTANEOUS | Status: DC
  Administered 2020-11-02: 14 [IU] via SUBCUTANEOUS
  Filled 2020-11-01 (×2): qty 0.14

## 2020-11-01 NOTE — Progress Notes (Signed)
Occupational Therapy Treatment Patient Details Name: [name] MRN: [id number] DOB: 08-29-1965 Today's Date: 11/01/2020    History of present illness Pt admitted from home with multiple LE wounds, and FTT and unable to perform basic self care.  Pt reports she stayed in lift chair getting up only twice a day for BM or getting food.  Pt utilizing Rollator around home but pivoted to it from lift chair and wheeled around in seated position.  Pt states she put pads and diapers in chair so she did not have to get up to urinate.  Pt with hx of DM and lymphadema (has been unable to use her pressure sleeves so lymphadema has significantly worsened since last December).   OT comments  Treatment focused on patient assisting more with rolling and increasing R hip flexion in side lying - while in bed and hoyer pad being positioned under patient. Still needs multiple people to roll patient. Patient placed in recliner. To work on preparation for sit to stand - patient's feet slowly lowered to dangling until foot rest completely closed. Patient dangled legs to work on tolerating knee flexion and pillows positioned behind back to promote a more upright seated position. Worked on leaning forward - as needed for sit to stand - with therapist assisting with pulling forward holding onto patient's hands and second assistant assisting with pushing trunk forward from the back. Patient used upper extremities ineffectively and unable to functionally pull herself forward. Is limited by back pain with this task but tolerated x 3 with rest breaks between each rep. Also performed knee extension - patient initiating movement - though minimal and very weak. Therapist assisted with active assist to PROM to achieve full knee extension. Patient reports more pain when knee return to flexion. Patient tolerated activities today despite pain and worked with therapist throughout. Cont POC.   Follow Up Recommendations  SNF     Equipment Recommendations  None recommended by OT    Recommendations for Other Services      Precautions / Restrictions Precautions Precautions: Fall Precaution Comments: LE wounds Restrictions Weight Bearing Restrictions: No       Mobility Bed Mobility   Bed Mobility: Rolling Rolling: Max assist;+2 for physical assistance;+2 for safety/equipment         General bed mobility comments: Patient working on assisting with rolling using upper extremities on bed rails and trunk movement though still +2 assistance still for rolling both left and right. Patient able to roll on back with from sidelying with min guard and encouragement. On roll to the left therapist worked on [place] closer to bed rail to promote hip flexion - achieving approx 30 degrees.    Transfers Overall transfer level: Needs assistance               General transfer comment: Total assist to transfer to recliner and position LEs. able to hold head up. This takes 2 people now, one to assist holding LES during lift and other to control patient and placement.    Balance                                           ADL either performed or assessed with clinical judgement   ADL  Vision Patient Visual Report: No change from baseline     Perception     Praxis      Cognition Arousal/Alertness: Awake/alert Behavior During Therapy: WFL for tasks assessed/performed Overall Cognitive Status: Within Functional Limits for tasks assessed                                          Exercises Other Exercises Other Exercises: Leaning forward in recliner x 3 - patient reporting back pain and though patient holding therapist hands (and therapist pulling) patient not using arms to pull forward. Other Exercises: In recliner positioned in increased upright position with feet dangling - working on knee  extension/activating quad muscles - patient can initate movement but very weak and needs active assist/PROM to acheive function knee extension. Patient tolerated stretch but reports pain when returned to flexion.   Shoulder Instructions       General Comments      Pertinent Vitals/ Pain       Pain Assessment: Faces Faces Pain Scale: Hurts worst Pain Location: B knees, back (with ROM/activity/movement) Pain Descriptors / Indicators: Grimacing;Guarding;Crying;Sharp Pain Intervention(s): Premedicated before session;Monitored during session;Repositioned  Home Living                                          Prior Functioning/Environment              Frequency  Min 2X/week        Progress Toward Goals  OT Goals(current goals can now be found in the care plan section)  Progress towards OT goals: Progressing toward goals  Acute Rehab OT Goals Patient Stated Goal: take manage myself and go home OT Goal Formulation: With patient Time For Goal Achievement: 11/13/20 Potential to Achieve Goals: [place] Discharge plan remains appropriate    Co-evaluation          OT goals addressed during session: Strengthening/ROM (mobilty)      AM-PAC OT "6 Clicks" Daily Activity     Outcome Measure   Help from another person eating meals?: None Help from another person taking care of personal grooming?: A Little Help from another person toileting, which includes using toliet, bedpan, or urinal?: Total Help from another person bathing (including washing, rinsing, drying)?: A Lot Help from another person to put on and taking off regular upper body clothing?: A Lot Help from another person to put on and taking off regular lower body clothing?: Total 6 Click Score: 13    End of Session    OT Visit Diagnosis: Other abnormalities of gait and mobility (R26.89);Muscle weakness (generalized) (M62.81);Pain   Activity Tolerance Patient limited by pain   Patient  Left in chair;with call bell/phone within reach   Nurse Communication Mobility status;Need for lift equipment        Time: 1210-1258 OT Time Calculation (min): 48 min  Charges: OT General Charges $OT Visit: 1 Visit OT Treatments $Therapeutic Activity: 38-52 mins  Brailyn Killion, OTR/L [place]  Office [phone] Pager: [place] 11/01/2020, 2:14 PM

## 2020-11-01 NOTE — Progress Notes (Addendum)
PROGRESS NOTE    [name]  GEX:[id number] DOB: 1965/11/14 DOA: 10/10/2020 PCP: Fanny Bien, MD    Chief Complaint  Patient presents with  . Wound Infection    Brief Narrative:  55 year old female morbidly obese type II diabetic with chronic lymphedema chronic nonhealing ulcers on both legs and calves and left upper thigh followed by wound clinic Dr. Asa Saunas was also being followed by home health nurses 3 times a week which has been terminated few weeks ago by insurance her insurance company. Now her wounds have become much larger with increased drainage and pain. She is unable to change the dressings by herself due to the location of the wound and morbid obesity. She does not walk she only moves around in the house in the rolling chair 1-2 times per day. She was not able to get to the restroom so she has been urinating in the bed which has made the wounds even much worse. She lives alone. She describes her pain as 10 out of 10 and has been receiving IV narcotics. Chronic wounds and multiple areas of skin breakdown. When patient came to the ER she had very old dressings still in place which were malodorous and had bloody drainage with chronic skin changes in the periwound area. She received Ancef in the ER, admitted for further management.  She's now off antibiotics.  Currently discharge is pending SNF placement.  Difficult placement given weight.   Assessment & Plan:   Principal Problem:   Wound infection Active Problems:   Hyperkalemia   Chronic peripheral venous hypertension with lower extremity complication   Lymphedema   Obesity   Type 2 diabetes mellitus (HCC)   HTN (hypertension)   Cellulitis   1 chronic bilateral lower extremity wounds/chronic lymphedema -On presentation patient noted to have increased pain, foul-smelling discharge on admission which has since improved.  Wound 100% pink and clean. -Status post 1 day of IV vancomycin and IV  cefepime (10/10/2020>>> 10/11/2020) -Patient seen by wound care with dressing changes recommended. -Lymphedema pumps, PT following. -Continue current pain management with oxycodone, Flexeril. -PT/OT.  2.  Type 2 diabetes mellitus Hemoglobin A1c 6.5 (10/10/2020) -CBG 192 this morning. -Patient not on oral hypoglycemic agents prior to admission and per prior attending refusing to be started on metformin. -Increase Lantus to 14 units daily.  -SSI.  3.  Hypertension -Continue current regimen HCTZ, metoprolol.   -Lasix discontinued.    4.  Mildly elevated D-dimer -Patient noted to have a mild tachycardia with heart rate in the low 100s. -D-dimer noted to be mildly elevated. -Lower extremity Dopplers were suboptimal study due to body habitus. -Patient noted to be 2 heavy flow CT scan and as such unable to do a CT angiogram of chest. -Patient however low suspicion for PE as patient denies any chest pain, no significant shortness of breath, not hypoxic. -Continue DVT prophylaxis. Follow-up.  5.  Morbid obesity BMI 73. -Lifestyle modification advised and Dr. Florene Glen discussed extensively with patient on 10/22/2020 about options of weight loss including consultation for bariatric surgery. -Per Dr. Florene Glen patient completely refuses even consultation from bariatric surgery stating to him that she had some friends that passed away after surgery and currently not interested. -Outpatient follow-up.  6.  Hypomagnesemia/hypokalemia -Magnesium repleted.  Potassium at 3.3.  Increase daily oral potassium supplementation to 40 mEq daily.  7.  Pressure injuries, POA Pressure Injury 10/11/20 Thigh Left;Posterior (Active)  10/11/20 0041  Location: Thigh  Location Orientation: Left;Posterior  Staging:  Wound Description (Comments):   Present on Admission:      Pressure Injury 10/11/20 Tibial Left;Posterior;Proximal (Active)  10/11/20 0041  Location: Tibial  Location Orientation:  Left;Posterior;Proximal  Staging:   Wound Description (Comments):   Present on Admission:     #8 disposition -Patient lives alone and unable to care for herself -Patient noted on presentation to be unable to change the dressings on the wound due to location, morbid obesity, pain. -Patient noted to be nonambulatory. -Patient noted to be moving around in her Rollator couple of times during the day. -Patient noted to be unable to cook for herself as she is unable to stand and subsequently requires placement. -Patient is unsafe discharge back home alone. -Patient with no family close by that could assist with her. -PT following patient and working on schedule with nursing staff for lymphedema pumps. -Out of bed to chair and multiple tilts per day. -TOC consulted for placement.      DVT prophylaxis: Lovenox Code Status: Full Family Communication: Updated patient.  No family at bedside. Disposition:   Status is: Inpatient    Dispo: The patient is from: Home              Anticipated d/c is to: SNF              Patient currently medically stable, awaiting placement.    Difficult to place patient yes       Consultants:   Wound care RN: Para March, RN 10/11/2020  Procedures:   Lower extremity Dopplers 10/23/2020  Chest x-ray 10/16/2020  Antimicrobials:  Anti-infectives (From admission, onward)   Start     Dose/Rate Route Frequency Ordered Stop   10/11/20 1000  vancomycin (VANCOREADY) IVPB 2000 mg/400 mL  Status:  Discontinued        2,000 mg 200 mL/hr over 120 Minutes Intravenous Every 12 hours 10/10/20 2047 10/11/20 1140   10/11/20 0100  ceFEPIme (MAXIPIME) 2 g in sodium chloride 0.9 % 100 mL IVPB  Status:  Discontinued        2 g 200 mL/hr over 30 Minutes Intravenous Every 8 hours 10/10/20 2001 10/11/20 1140   10/10/20 2100  vancomycin (VANCOCIN) 2,500 mg in sodium chloride 0.9 % 500 mL IVPB        2,500 mg 250 mL/hr over 120 Minutes Intravenous  Once 10/10/20 2001  10/11/20 0135   10/10/20 1545  ceFAZolin (ANCEF) IVPB 2g/100 mL premix        2 g 200 mL/hr over 30 Minutes Intravenous  Once 10/10/20 1530 10/10/20 1723       Subjective: Sitting up in bed.  No chest pain.  No shortness of breath.  No abdominal pain.  Tolerating diet.    Objective: Vitals:   10/31/20 1352 10/31/20 2126 11/01/20 0500 11/01/20 0509  BP: (!) 151/68 140/70  123/75  Pulse: 96 80  90  Resp: 20 17  17   Temp: 98.4 F (36.9 C) 99 F (37.2 C)  98.5 F (36.9 C)  TempSrc: Oral Oral  Oral  SpO2: 95% 96%  98%  Weight:   (!) 217.4 kg   Height:        Intake/Output Summary (Last 24 hours) at 11/01/2020 1319 Last data filed at 11/01/2020 0514 Gross per 24 hour  Intake 480 ml  Output 800 ml  Net -320 ml   Filed Weights   10/10/20 1954 10/22/20 1531 11/01/20 0500  Weight: (!) 233.6 kg (!) 216.5 kg (!) 217.4 kg  Examination:  General exam: NAD Respiratory system: CTA B anterior lung fields.  No wheezes, no crackles, no rhonchi.  Normal respiratory effort. Cardiovascular system: Regular rate and rhythm no murmurs rubs or gallops.  No JVD.   Gastrointestinal system: Abdomen is obese, soft, nontender, nondistended, positive bowel sounds.  No rebound.  No guarding. Central nervous system: Alert and oriented. No focal neurological deficits. Extremities: Bilateral lymphedema.   Skin: Bilateral lower extremity with chronic venous stasis changes.  Psychiatry: Judgement and insight appear normal. Mood & affect appropriate.     Data Reviewed: I have personally reviewed following labs and imaging studies  CBC: Recent Labs  Lab 10/28/20 0308 10/30/20 0327 10/31/20 0315  WBC 7.2 6.2 7.6  NEUTROABS 3.8 3.4 4.5  HGB 13.1 13.2 13.6  HCT 43.6 44.2 46.0  MCV 96.9 97.8 98.3  PLT 290 271 767    Basic Metabolic Panel: Recent Labs  Lab 10/28/20 0308 10/30/20 0327 10/31/20 0315 11/01/20 0321  NA 136 136 136 135  K 3.7 3.5 3.4* 3.3*  CL 97* 100 99 98  CO2 30 25 31  26   GLUCOSE 187* 200* 200* 185*  BUN 25* 22* 22* 19  CREATININE 0.87 0.76 0.78 0.90  CALCIUM 9.4 9.3 9.5 9.4  MG  --  1.6* 1.9  --   PHOS  --  4.8*  --   --     GFR: Estimated Creatinine Clearance: 147.9 mL/min (by C-G formula based on SCr of 0.9 mg/dL).  Liver Function Tests: Recent Labs  Lab 10/30/20 0327  AST 17  ALT 15  ALKPHOS 63  BILITOT 0.8  PROT 7.7  ALBUMIN 3.4*    CBG: Recent Labs  Lab 10/31/20 1300 10/31/20 1720 10/31/20 1931 11/01/20 0712 11/01/20 1113  GLUCAP 197* 180* 202* 192* 209*     No results found for this or any previous visit (from the past 240 hour(s)).       Radiology Studies: No results found.      Scheduled Meds: . acetaminophen  1,000 mg Oral QHS  . enoxaparin (LOVENOX) injection  100 mg Subcutaneous Q24H  . ferrous sulfate  325 mg Oral Daily  . hydrochlorothiazide  12.5 mg Oral Daily  . hydrocortisone cream   Topical BID  . hydrOXYzine  10 mg Oral TID  . insulin aspart  0-20 Units Subcutaneous TID WC  . insulin aspart  0-5 Units Subcutaneous QHS  . insulin glargine  10 Units Subcutaneous Daily  . magnesium oxide  400 mg Oral Daily  . metoprolol tartrate  12.5 mg Oral BID  . polyethylene glycol  17 g Oral Daily  . potassium chloride  40 mEq Oral Daily  . senna-docusate  1 tablet Oral BID   Continuous Infusions: . sodium chloride 250 mL (10/15/20 2301)     LOS: 15 days    Time spent: 35 minutes    Irine Seal, MD [facility]   To contact the attending provider between 7A-7P or the covering provider during after hours 7P-7A, please log into the web site [email or web address] and access using universal [place] password for that web site. If you do not have the password, please call the hospital operator.  11/01/2020, 1:19 PM

## 2020-11-02 DIAGNOSIS — I87399 Chronic venous hypertension (idiopathic) with other complications of unspecified lower extremity: Secondary | ICD-10-CM | POA: Diagnosis not present

## 2020-11-02 DIAGNOSIS — I890 Lymphedema, not elsewhere classified: Secondary | ICD-10-CM | POA: Diagnosis not present

## 2020-11-02 DIAGNOSIS — T148XXA Other injury of unspecified body region, initial encounter: Secondary | ICD-10-CM | POA: Diagnosis not present

## 2020-11-02 DIAGNOSIS — I10 Essential (primary) hypertension: Secondary | ICD-10-CM | POA: Diagnosis not present

## 2020-11-02 LAB — GLUCOSE, CAPILLARY
Glucose-Capillary: 233 mg/dL — ABNORMAL HIGH (ref 70–99)
Glucose-Capillary: 197 mg/dL — ABNORMAL HIGH (ref 70–99)
Glucose-Capillary: 192 mg/dL — ABNORMAL HIGH (ref 70–99)
Glucose-Capillary: 171 mg/dL — ABNORMAL HIGH (ref 70–99)

## 2020-11-02 LAB — BASIC METABOLIC PANEL
Anion gap: 8 (ref 5–15)
BUN: 20 mg/dL (ref 6–20)
CO2: 30 mmol/L (ref 22–32)
Calcium: 9.3 mg/dL (ref 8.9–10.3)
Chloride: 98 mmol/L (ref 98–111)
Creatinine, Ser: 1.06 mg/dL — ABNORMAL HIGH (ref 0.44–1.00)
GFR, Estimated: >60 mL/min (ref >60)
Glucose, Bld: 185 mg/dL — ABNORMAL HIGH (ref 70–99)
Potassium: 3.4 mmol/L — ABNORMAL LOW (ref 3.5–5.1)
Sodium: 136 mmol/L (ref 135–145)

## 2020-11-02 MED ORDER — POTASSIUM CHLORIDE CRYS ER 20 MEQ PO TBCR
40.0000 meq | EXTENDED_RELEASE_TABLET | Freq: Once | ORAL | Status: AC
  Administered 2020-11-02: 40 meq via ORAL
  Filled 2020-11-02: qty 2

## 2020-11-02 MED ORDER — DICLOFENAC SODIUM 1 % EX GEL
2.0000 g | Freq: Four times a day (QID) | CUTANEOUS | Status: DC | PRN
  Administered 2020-11-02 – 2020-12-26 (×31): 2 g via TOPICAL
  Filled 2020-11-02 (×5): qty 100

## 2020-11-02 NOTE — Plan of Care (Signed)
  Problem: Education: Goal: Knowledge of General Education information will improve Description Including pain rating scale, medication(s)/side effects and non-pharmacologic comfort measures Outcome: Progressing   Problem: Health Behavior/Discharge Planning: Goal: Ability to manage health-related needs will improve Outcome: Progressing   

## 2020-11-02 NOTE — Progress Notes (Signed)
PROGRESS NOTE    [name]  [id number] DOB: 10-16-1965 DOA: 10/10/2020 PCP: Fanny Bien, MD    Chief Complaint  Patient presents with  . Wound Infection    Brief Narrative:  55 year old female morbidly obese type II diabetic with chronic lymphedema chronic nonhealing ulcers on both legs and calves and left upper thigh followed by wound clinic Dr. Asa Saunas was also being followed by home health nurses 3 times a week which has been terminated few weeks ago by insurance her insurance company. Now her wounds have become much larger with increased drainage and pain. She is unable to change the dressings by herself due to the location of the wound and morbid obesity. She does not walk she only moves around in the house in the rolling chair 1-2 times per day. She was not able to get to the restroom so she has been urinating in the bed which has made the wounds even much worse. She lives alone. She describes her pain as 10 out of 10 and has been receiving IV narcotics. Chronic wounds and multiple areas of skin breakdown. When patient came to the ER she had very old dressings still in place which were malodorous and had bloody drainage with chronic skin changes in the periwound area. She received Ancef in the ER, admitted for further management.  She's now off antibiotics.  Currently discharge is pending SNF placement.  Difficult placement given weight.   Assessment & Plan:   Principal Problem:   Wound infection Active Problems:   Hyperkalemia   Chronic peripheral venous hypertension with lower extremity complication   Lymphedema   Obesity   Type 2 diabetes mellitus (HCC)   HTN (hypertension)   Cellulitis   1 chronic bilateral lower extremity wounds/chronic lymphedema -On presentation patient noted to have increased pain, foul-smelling discharge on admission which has since improved.  Wound 100% pink and clean. -Status post 1 day of IV vancomycin and IV  cefepime (10/10/2020>>> 10/11/2020) -Patient seen by wound care with dressing changes recommended. -Lymphedema pumps, PT following. -Continue current pain management with oxycodone, Flexeril. -PT/OT.  2.  Type 2 diabetes mellitus Hemoglobin A1c 6.5 (10/10/2020) -CBG 233 this morning. -Patient not on oral hypoglycemic agents prior to admission and per prior attending refusing to be started on metformin. -Continue current dose of Lantus 14 units daily.   -SSI.  3.  Hypertension -Blood pressure borderline.   -Continue HCTZ.  Metoprolol held this morning.  Lasix has been discontinued.  -Follow.  4.  Mildly elevated D-dimer -Patient noted to have a mild tachycardia with heart rate in the low 100s. -D-dimer noted to be mildly elevated. -Lower extremity Dopplers were suboptimal study due to body habitus. -Patient noted to be 2 heavy flow CT scan and as such unable to do a CT angiogram of chest. -Patient however low suspicion for PE as patient denies any chest pain, no significant shortness of breath, not hypoxic. -Continue DVT prophylaxis. Follow-up.  5.  Morbid obesity BMI 73. -Lifestyle modification advised and Dr. Florene Glen discussed extensively with patient on 10/22/2020 about options of weight loss including consultation for bariatric surgery. -Per Dr. Florene Glen patient completely refuses even consultation from bariatric surgery stating to him that she had some friends that passed away after surgery and currently not interested. -Outpatient follow-up.  6.  Hypomagnesemia/hypokalemia -Magnesium repleted.  Potassium at 3.4.   -Continue oral daily potassium supplementation.   -We will give extra dose of potassium.   7.  Pressure injuries, POA Pressure  Injury 10/11/20 Thigh Left;Posterior (Active)  10/11/20 0041  Location: Thigh  Location Orientation: Left;Posterior  Staging:   Wound Description (Comments):   Present on Admission:      Pressure Injury 10/11/20 Tibial  Left;Posterior;Proximal (Active)  10/11/20 0041  Location: Tibial  Location Orientation: Left;Posterior;Proximal  Staging:   Wound Description (Comments):   Present on Admission:     #8 disposition -Patient lives alone and unable to care for herself -Patient noted on presentation to be unable to change the dressings on the wound due to location, morbid obesity, pain. -Patient noted to be nonambulatory. -Patient noted to be moving around in her Rollator couple of times during the day. -Patient noted to be unable to cook for herself as she is unable to stand and subsequently requires placement. -Patient is unsafe discharge back home alone. -Patient with no family close by that could assist with her. -PT following patient and working on schedule with nursing staff for lymphedema pumps. -Out of bed to chair and multiple tilts per day. -TOC consulted for placement.      DVT prophylaxis: Lovenox Code Status: Full Family Communication: Updated patient.  No family at bedside. Disposition:   Status is: Inpatient    Dispo: The patient is from: Home              Anticipated d/c is to: SNF              Patient medically stable.  Awaiting placement.    Difficult to place patient yes       Consultants:   Wound care RN: Para March, RN 10/11/2020  Procedures:   Lower extremity Dopplers 10/23/2020  Chest x-ray 10/16/2020  Antimicrobials:  Anti-infectives (From admission, onward)   Start     Dose/Rate Route Frequency Ordered Stop   10/11/20 1000  vancomycin (VANCOREADY) IVPB 2000 mg/400 mL  Status:  Discontinued        2,000 mg 200 mL/hr over 120 Minutes Intravenous Every 12 hours 10/10/20 2047 10/11/20 1140   10/11/20 0100  ceFEPIme (MAXIPIME) 2 g in sodium chloride 0.9 % 100 mL IVPB  Status:  Discontinued        2 g 200 mL/hr over 30 Minutes Intravenous Every 8 hours 10/10/20 2001 10/11/20 1140   10/10/20 2100  vancomycin (VANCOCIN) 2,500 mg in sodium chloride 0.9 % 500  mL IVPB        2,500 mg 250 mL/hr over 120 Minutes Intravenous  Once 10/10/20 2001 10/11/20 0135   10/10/20 1545  ceFAZolin (ANCEF) IVPB 2g/100 mL premix        2 g 200 mL/hr over 30 Minutes Intravenous  Once 10/10/20 1530 10/10/20 1723       Subjective: Sitting up in bed.  Wondering whether she can get some Voltaren gel for her knees.  No chest pain.  No shortness of breath.  No abdominal pain.  Tolerating diet.    Objective: Vitals:   11/01/20 0509 11/01/20 1327 11/01/20 2158 11/02/20 0500  BP: 123/75 (!) 102/56 109/70 110/75  Pulse: 90 96 90 90  Resp: 17 18 18 17   Temp: 98.5 F (36.9 C) 98.3 F (36.8 C) 98.6 F (37 C) 97.9 F (36.6 C)  TempSrc: Oral Oral Oral Oral  SpO2: 98% 93% 95% 98%  Weight:      Height:        Intake/Output Summary (Last 24 hours) at 11/02/2020 1223 Last data filed at 11/02/2020 1000 Gross per 24 hour  Intake 60 ml  Output  1100 ml  Net -1040 ml   Filed Weights   10/10/20 1954 10/22/20 1531 11/01/20 0500  Weight: (!) 233.6 kg (!) 216.5 kg (!) 217.4 kg    Examination:  General exam: NAD Respiratory system: Lungs clear to auscultation bilaterally anterior lung fields.  No wheezes, no crackles, no rhonchi.  Normal respiratory effort.  Cardiovascular system: RRR no murmurs rubs or gallops.  No JVD.  No lower extremity edema.  Gastrointestinal system: Abdomen is soft, nontender, obese, nondistended, positive bowel sounds.  No rebound.  No guarding. Central nervous system: Alert and oriented. No focal neurological deficits. Extremities: Bilateral lymphedema.   Skin: Bilateral lower extremity with chronic venous stasis changes.  Psychiatry: Judgement and insight appear normal. Mood & affect appropriate.     Data Reviewed: I have personally reviewed following labs and imaging studies  CBC: Recent Labs  Lab 10/28/20 0308 10/30/20 0327 10/31/20 0315  WBC 7.2 6.2 7.6  NEUTROABS 3.8 3.4 4.5  HGB 13.1 13.2 13.6  HCT 43.6 44.2 46.0  MCV 96.9  97.8 98.3  PLT 290 271 595    Basic Metabolic Panel: Recent Labs  Lab 10/28/20 0308 10/30/20 0327 10/31/20 0315 11/01/20 0321 11/02/20 0247  NA 136 136 136 135 136  K 3.7 3.5 3.4* 3.3* 3.4*  CL 97* 100 99 98 98  CO2 30 25 31 26 30   GLUCOSE 187* 200* 200* 185* 185*  BUN 25* 22* 22* 19 20  CREATININE 0.87 0.76 0.78 0.90 1.06*  CALCIUM 9.4 9.3 9.5 9.4 9.3  MG  --  1.6* 1.9  --   --   PHOS  --  4.8*  --   --   --     GFR: Estimated Creatinine Clearance: 125.5 mL/min (A) (by C-G formula based on SCr of 1.06 mg/dL (H)).  Liver Function Tests: Recent Labs  Lab 10/30/20 0327  AST 17  ALT 15  ALKPHOS 63  BILITOT 0.8  PROT 7.7  ALBUMIN 3.4*    CBG: Recent Labs  Lab 11/01/20 1113 11/01/20 1618 11/01/20 2014 11/02/20 0743 11/02/20 1142  GLUCAP 209* 193* 194* 233* 197*     No results found for this or any previous visit (from the past 240 hour(s)).       Radiology Studies: No results found.      Scheduled Meds: . acetaminophen  1,000 mg Oral QHS  . enoxaparin (LOVENOX) injection  100 mg Subcutaneous Q24H  . ferrous sulfate  325 mg Oral Daily  . hydrochlorothiazide  12.5 mg Oral Daily  . hydrocortisone cream   Topical BID  . hydrOXYzine  10 mg Oral TID  . insulin aspart  0-20 Units Subcutaneous TID WC  . insulin aspart  0-5 Units Subcutaneous QHS  . insulin glargine  14 Units Subcutaneous Daily  . magnesium oxide  400 mg Oral Daily  . metoprolol tartrate  12.5 mg Oral BID  . polyethylene glycol  17 g Oral Daily  . potassium chloride  40 mEq Oral Daily  . potassium chloride  40 mEq Oral Once  . senna-docusate  1 tablet Oral BID   Continuous Infusions: . sodium chloride 250 mL (10/15/20 2301)     LOS: 16 days    Time spent: 35 minutes    Irine Seal, MD [facility]   To contact the attending provider between 7A-7P or the covering provider during after hours 7P-7A, please log into the web site [email or web address] and access using  universal [place] password for that web site. If  you do not have the password, please call the hospital operator.  11/02/2020, 12:23 PM

## 2020-11-03 DIAGNOSIS — I890 Lymphedema, not elsewhere classified: Secondary | ICD-10-CM | POA: Diagnosis not present

## 2020-11-03 DIAGNOSIS — I10 Essential (primary) hypertension: Secondary | ICD-10-CM | POA: Diagnosis not present

## 2020-11-03 DIAGNOSIS — I87399 Chronic venous hypertension (idiopathic) with other complications of unspecified lower extremity: Secondary | ICD-10-CM | POA: Diagnosis not present

## 2020-11-03 DIAGNOSIS — T148XXA Other injury of unspecified body region, initial encounter: Secondary | ICD-10-CM | POA: Diagnosis not present

## 2020-11-03 LAB — BASIC METABOLIC PANEL
Anion gap: 10 (ref 5–15)
BUN: 21 mg/dL — ABNORMAL HIGH (ref 6–20)
CO2: 25 mmol/L (ref 22–32)
Calcium: 9.1 mg/dL (ref 8.9–10.3)
Chloride: 99 mmol/L (ref 98–111)
Creatinine, Ser: 0.84 mg/dL (ref 0.44–1.00)
GFR, Estimated: >60 mL/min (ref >60)
Glucose, Bld: 180 mg/dL — ABNORMAL HIGH (ref 70–99)
Potassium: 3.5 mmol/L (ref 3.5–5.1)
Sodium: 134 mmol/L — ABNORMAL LOW (ref 135–145)

## 2020-11-03 LAB — CBC WITH DIFFERENTIAL/PLATELET
Abs Immature Granulocytes: 0.02 10*3/uL (ref 0.00–0.07)
Basophils Absolute: 0 10*3/uL (ref 0.0–0.1)
Basophils Relative: 1 %
Eosinophils Absolute: 0.3 10*3/uL (ref 0.0–0.5)
Eosinophils Relative: 5 %
HCT: 44.5 % (ref 36.0–46.0)
Hemoglobin: 13.3 g/dL (ref 12.0–15.0)
Immature Granulocytes: 0 %
Lymphocytes Relative: 34 %
Lymphs Abs: 2.1 10*3/uL (ref 0.7–4.0)
MCH: 29 pg (ref 26.0–34.0)
MCHC: 29.9 g/dL — ABNORMAL LOW (ref 30.0–36.0)
MCV: 97.2 fL (ref 80.0–100.0)
Monocytes Absolute: 0.5 10*3/uL (ref 0.1–1.0)
Monocytes Relative: 8 %
Neutro Abs: 3.2 10*3/uL (ref 1.7–7.7)
Neutrophils Relative %: 52 %
Platelets: 243 10*3/uL (ref 150–400)
RBC: 4.58 MIL/uL (ref 3.87–5.11)
RDW: 13.3 % (ref 11.5–15.5)
WBC: 6.2 10*3/uL (ref 4.0–10.5)
nRBC: 0 % (ref 0.0–0.2)

## 2020-11-03 LAB — GLUCOSE, CAPILLARY
Glucose-Capillary: 192 mg/dL — ABNORMAL HIGH (ref 70–99)
Glucose-Capillary: 190 mg/dL — ABNORMAL HIGH (ref 70–99)
Glucose-Capillary: 162 mg/dL — ABNORMAL HIGH (ref 70–99)
Glucose-Capillary: 199 mg/dL — ABNORMAL HIGH (ref 70–99)

## 2020-11-03 MED ORDER — BISACODYL 10 MG RE SUPP
10.0000 mg | Freq: Once | RECTAL | Status: DC
  Filled 2020-11-03: qty 1

## 2020-11-03 MED ORDER — POLYETHYLENE GLYCOL 3350 17 G PO PACK
17.0000 g | PACK | Freq: Two times a day (BID) | ORAL | Status: DC
  Administered 2020-11-04 – 2020-11-18 (×10): 17 g via ORAL
  Filled 2020-11-03 (×23): qty 1

## 2020-11-03 MED ORDER — INSULIN GLARGINE 100 UNIT/ML ~~LOC~~ SOLN
18.0000 [IU] | Freq: Every day | SUBCUTANEOUS | Status: DC
  Administered 2020-11-03 – 2020-11-26 (×24): 18 [IU] via SUBCUTANEOUS
  Filled 2020-11-03 (×26): qty 0.18

## 2020-11-03 NOTE — Progress Notes (Addendum)
PROGRESS NOTE    [name]  VFI:[id number] DOB: [birth year] DOA: 10/10/2020 PCP: Fanny Bien, MD    Chief Complaint  Patient presents with  . Wound Infection    Brief Narrative:  55 year old female morbidly obese type II diabetic with chronic lymphedema chronic nonhealing ulcers on both legs and calves and left upper thigh followed by wound clinic Dr. Asa Saunas was also being followed by home health nurses 3 times a week which has been terminated few weeks ago by insurance her insurance company. Now her wounds have become much larger with increased drainage and pain. She is unable to change the dressings by herself due to the location of the wound and morbid obesity. She does not walk she only moves around in the house in the rolling chair 1-2 times per day. She was not able to get to the restroom so she has been urinating in the bed which has made the wounds even much worse. She lives alone. She describes her pain as 10 out of 10 and has been receiving IV narcotics. Chronic wounds and multiple areas of skin breakdown. When patient came to the ER she had very old dressings still in place which were malodorous and had bloody drainage with chronic skin changes in the periwound area. She received Ancef in the ER, admitted for further management.  She's now off antibiotics.  Currently discharge is pending SNF placement.  Difficult placement given weight.   Assessment & Plan:   Principal Problem:   Wound infection Active Problems:   Hyperkalemia   Chronic peripheral venous hypertension with lower extremity complication   Lymphedema   Obesity   Type 2 diabetes mellitus (HCC)   HTN (hypertension)   Cellulitis   1 chronic bilateral lower extremity wounds/chronic lymphedema -On presentation patient noted to have increased pain, foul-smelling discharge from wounds on admission which has since improved.  Wound 100% pink and clean. -Status post 1 day of IV vancomycin  and IV cefepime (10/10/2020>>> 10/11/2020) -Patient seen by wound care with dressing changes recommended. -Lymphedema pumps, PT following. -Continue current pain management with oxycodone, Flexeril. -PT/OT.  2.  Type 2 diabetes mellitus Hemoglobin A1c 6.5 (10/10/2020) -CBG 192 this morning.   -Patient not on oral hypoglycemic agents prior to admission and per prior attending refusing to be started on metformin. -Increase Lantus to 18 units daily.   -SSI.  3.  Hypertension -Blood pressure borderline.   -Discontinue HCTZ  -Lasix discontinued.   -Continue Metroprolol.    4.  Mildly elevated D-dimer -Patient noted to have a mild tachycardia with heart rate in the low 100s. -D-dimer noted to be mildly elevated. -Lower extremity Dopplers were suboptimal study due to body habitus. -Patient noted to be 2 heavy flow CT scan and as such unable to do a CT angiogram of chest. -Patient however low suspicion for PE as patient denies any chest pain, no significant shortness of breath, not hypoxic. -Continue DVT prophylaxis. Follow-up.  5.  Morbid obesity BMI 73. -Lifestyle modification advised and Dr. Florene Glen discussed extensively with patient on 10/22/2020 about options of weight loss including consultation for bariatric surgery. -Per Dr. Florene Glen patient completely refuses even consultation from bariatric surgery stating to him that she had some friends that passed away after surgery and currently not interested. -Outpatient follow-up.  6.  Hypomagnesemia/hypokalemia -Magnesium repleted.   -Potassium at 3.5  -DC HCTZ secondary to borderline blood pressure.   -Continue oral daily potassium supplementation.   7.  Pressure injuries, POA Pressure Injury  10/11/20 Thigh Left;Posterior (Active)  10/11/20 0041  Location: Thigh  Location Orientation: Left;Posterior  Staging:   Wound Description (Comments):   Present on Admission:      Pressure Injury 10/11/20 Tibial Left;Posterior;Proximal  (Active)  10/11/20 0041  Location: Tibial  Location Orientation: Left;Posterior;Proximal  Staging:   Wound Description (Comments):   Present on Admission:    #8 constipation -Per RN patient with no bowel movement in approximately 11 days. -Increase MiraLAX to twice daily. Continue Senokot-S twice daily. Dulcolax 10 mg suppository x1 ordered however patient refusing. Follow.  #8 disposition -Patient lives alone and unable to care for herself -Patient noted on presentation to be unable to change the dressings on the wound due to location, morbid obesity, pain. -Patient noted to be nonambulatory. -Patient noted to be moving around in her Rollator couple of times during the day. -Patient noted to be unable to cook for herself as she is unable to stand and subsequently requires placement. -Patient is unsafe discharge back home alone. -Patient with no family close by that could assist with her. -PT following patient and working on schedule with nursing staff for lymphedema pumps. -Out of bed to chair and multiple tilts per day. -TOC consulted for placement.      DVT prophylaxis: Lovenox Code Status: Full Family Communication: Updated patient.  No family at bedside. Disposition:   Status is: Inpatient    Dispo: The patient is from: Home              Anticipated d/c is to: SNF              Patient medically stable.  Awaiting placement.    Difficult to place patient yes       Consultants:   Wound care RN: Para March, RN 10/11/2020  Procedures:   Lower extremity Dopplers 10/23/2020  Chest x-ray 10/16/2020  Antimicrobials:  Anti-infectives (From admission, onward)   Start     Dose/Rate Route Frequency Ordered Stop   10/11/20 1000  vancomycin (VANCOREADY) IVPB 2000 mg/400 mL  Status:  Discontinued        2,000 mg 200 mL/hr over 120 Minutes Intravenous Every 12 hours 10/10/20 2047 10/11/20 1140   10/11/20 0100  ceFEPIme (MAXIPIME) 2 g in sodium chloride 0.9 % 100 mL  IVPB  Status:  Discontinued        2 g 200 mL/hr over 30 Minutes Intravenous Every 8 hours 10/10/20 2001 10/11/20 1140   10/10/20 2100  vancomycin (VANCOCIN) 2,500 mg in sodium chloride 0.9 % 500 mL IVPB        2,500 mg 250 mL/hr over 120 Minutes Intravenous  Once 10/10/20 2001 10/11/20 0135   10/10/20 1545  ceFAZolin (ANCEF) IVPB 2g/100 mL premix        2 g 200 mL/hr over 30 Minutes Intravenous  Once 10/10/20 1530 10/10/20 1723       Subjective: Laying in bed.  On the telephone.  No chest pain.  No shortness of breath.  No abdominal pain.  Feels Voltaren gel helping her knee pain.    Objective: Vitals:   11/02/20 1341 11/02/20 2212 11/03/20 0617 11/03/20 1304  BP: (!) 107/48 113/70 120/65 111/61  Pulse: 99 (!) 106 (!) 102 94  Resp: 14 15 18    Temp: 98.5 F (36.9 C) 99.3 F (37.4 C) 98.4 F (36.9 C) 98.4 F (36.9 C)  TempSrc: Oral     SpO2: 93% 94% 90% 95%  Weight:      Height:  Intake/Output Summary (Last 24 hours) at 11/03/2020 1324 Last data filed at 11/03/2020 1313 Gross per 24 hour  Intake 240 ml  Output 1000 ml  Net -760 ml   Filed Weights   10/10/20 1954 10/22/20 1531 11/01/20 0500  Weight: (!) 233.6 kg (!) 216.5 kg (!) 217.4 kg    Examination:  General exam: NAD Respiratory system: CTA B anterior lung fields.  No wheezes, no crackles, no rhonchi.  Normal respiratory effort.  Cardiovascular system: Regular rate rhythm no murmurs rubs or gallops.  No JVD.  No lower extremity edema. Gastrointestinal system: Abdomen is obese, soft, nontender, nondistended, positive bowel sounds.  No rebound.  No guarding.  Central nervous system: Alert and oriented. No focal neurological deficits. Extremities: Bilateral lymphedema.   Skin: Bilateral lower extremity with chronic venous stasis changes.  Psychiatry: Judgement and insight appear normal. Mood & affect appropriate.     Data Reviewed: I have personally reviewed following labs and imaging  studies  CBC: Recent Labs  Lab 10/28/20 0308 10/30/20 0327 10/31/20 0315 11/03/20 0408  WBC 7.2 6.2 7.6 6.2  NEUTROABS 3.8 3.4 4.5 3.2  HGB 13.1 13.2 13.6 13.3  HCT 43.6 44.2 46.0 44.5  MCV 96.9 97.8 98.3 97.2  PLT 290 271 279 0000000    Basic Metabolic Panel: Recent Labs  Lab 10/30/20 0327 10/31/20 0315 11/01/20 0321 11/02/20 0247 11/03/20 0408  NA 136 136 135 136 134*  K 3.5 3.4* 3.3* 3.4* 3.5  CL 100 99 98 98 99  CO2 25 31 26 30 25   GLUCOSE 200* 200* 185* 185* 180*  BUN 22* 22* 19 20 21*  CREATININE 0.76 0.78 0.90 1.06* 0.84  CALCIUM 9.3 9.5 9.4 9.3 9.1  MG 1.6* 1.9  --   --   --   PHOS 4.8*  --   --   --   --     GFR: Estimated Creatinine Clearance: 158.4 mL/min (by C-G formula based on SCr of 0.84 mg/dL).  Liver Function Tests: Recent Labs  Lab 10/30/20 0327  AST 17  ALT 15  ALKPHOS 63  BILITOT 0.8  PROT 7.7  ALBUMIN 3.4*    CBG: Recent Labs  Lab 11/02/20 1142 11/02/20 1730 11/02/20 2213 11/03/20 0757 11/03/20 1113  GLUCAP 197* 192* 171* 192* 190*     No results found for this or any previous visit (from the past 240 hour(s)).       Radiology Studies: No results found.      Scheduled Meds: . acetaminophen  1,000 mg Oral QHS  . enoxaparin (LOVENOX) injection  100 mg Subcutaneous Q24H  . ferrous sulfate  325 mg Oral Daily  . hydrocortisone cream   Topical BID  . hydrOXYzine  10 mg Oral TID  . insulin aspart  0-20 Units Subcutaneous TID WC  . insulin aspart  0-5 Units Subcutaneous QHS  . insulin glargine  18 Units Subcutaneous Daily  . magnesium oxide  400 mg Oral Daily  . metoprolol tartrate  12.5 mg Oral BID  . polyethylene glycol  17 g Oral Daily  . potassium chloride  40 mEq Oral Daily  . senna-docusate  1 tablet Oral BID   Continuous Infusions: . sodium chloride 250 mL (10/15/20 2301)     LOS: 17 days    Time spent: 35 minutes    Irine Seal, MD [facility]   To contact the attending provider  between 7A-7P or the covering provider during after hours 7P-7A, please log into the web site [email or web address]  and access using universal [place] password for that web site. If you do not have the password, please call the hospital operator.  11/03/2020, 1:24 PM

## 2020-11-04 DIAGNOSIS — T148XXA Other injury of unspecified body region, initial encounter: Secondary | ICD-10-CM | POA: Diagnosis not present

## 2020-11-04 DIAGNOSIS — I10 Essential (primary) hypertension: Secondary | ICD-10-CM | POA: Diagnosis not present

## 2020-11-04 DIAGNOSIS — I890 Lymphedema, not elsewhere classified: Secondary | ICD-10-CM | POA: Diagnosis not present

## 2020-11-04 DIAGNOSIS — I87399 Chronic venous hypertension (idiopathic) with other complications of unspecified lower extremity: Secondary | ICD-10-CM | POA: Diagnosis not present

## 2020-11-04 LAB — GLUCOSE, CAPILLARY
Glucose-Capillary: 183 mg/dL — ABNORMAL HIGH (ref 70–99)
Glucose-Capillary: 185 mg/dL — ABNORMAL HIGH (ref 70–99)
Glucose-Capillary: 152 mg/dL — ABNORMAL HIGH (ref 70–99)
Glucose-Capillary: 155 mg/dL — ABNORMAL HIGH (ref 70–99)

## 2020-11-04 LAB — BASIC METABOLIC PANEL
Anion gap: 11 (ref 5–15)
BUN: 21 mg/dL — ABNORMAL HIGH (ref 6–20)
CO2: 27 mmol/L (ref 22–32)
Calcium: 9.5 mg/dL (ref 8.9–10.3)
Chloride: 102 mmol/L (ref 98–111)
Creatinine, Ser: 0.66 mg/dL (ref 0.44–1.00)
GFR, Estimated: >60 mL/min (ref >60)
Glucose, Bld: 186 mg/dL — ABNORMAL HIGH (ref 70–99)
Potassium: 3.5 mmol/L (ref 3.5–5.1)
Sodium: 140 mmol/L (ref 135–145)

## 2020-11-04 NOTE — Progress Notes (Signed)
Physical Therapy Treatment Patient Details Name: [name] MRN: [id number] DOB: 1965-07-17 Today's Date: 11/04/2020    History of Present Illness Pt admitted from home with multiple LE wounds, and FTT and unable to perform basic self care.  Pt reports she stayed in lift chair getting up only twice a day for BM or getting food.  Pt utilizing Rollator around home but pivoted to it from lift chair and wheeled around in seated position.  Pt states she put pads and diapers in chair so she did not have to get up to urinate.  Pt with hx of DM and lymphadema (has been unable to use her pressure sleeves so lymphadema has significantly worsened since last December).    PT Comments    Assisted back to bed via maxisky lift with 2 persons for safety and support of the legs when airborne. Tilted patient to 30* for 15 minutes with minimal c/o pain initially ,until near end of stand. Patient reports that she wants to get 45 degrees again(goal for next visit).  continue tilting. Attempts at unweighting each leg with slight knee bends not successful today. ? Due to position of each leg being in ER in tilting position with starpas across lower and upper legs. Continue tilting tolerance.  Follow Up Recommendations  SNF     Equipment Recommendations  None recommended by PT    Recommendations for Other Services       Precautions / Restrictions Precautions Precautions: Fall Precaution Comments: posterior LE wounds, place bed pad  to cushion when maxiskying    Mobility  Bed Mobility   Bed Mobility: Rolling Rolling: Max assist;+2 for physical assistance;+2 for safety/equipment         General bed mobility comments: Patient working on assisting with rolling using upper extremities on bed rails and trunk movement though still +2 assistance still for rolling both left and right. Patient able to roll on back with from sidelying with min guard and encouragement. Start Time: 63 Angle: 35  degrees Total Minutes in Angle: 15 minutes  Transfers                 General transfer comment: transfer from recliner via maxisky back to bed  Ambulation/Gait                 Stairs             Wheelchair Mobility    Modified Rankin (Stroke Patients Only)       Balance                                            Cognition Arousal/Alertness: Awake/alert                                            Exercises      General Comments        Pertinent Vitals/Pain Faces Pain Scale: Hurts even more Pain Location: B knees, back (with ROM/activity/movement) Pain Descriptors / Indicators: Spasm Pain Intervention(s): Premedicated before session    Home Living                      Prior Function            PT Goals (current goals  can now be found in the care plan section) Progress towards PT goals: Progressing toward goals    Frequency    Min 2X/week      PT Plan Current plan remains appropriate    Co-evaluation              AM-PAC PT "6 Clicks" Mobility   Outcome Measure  Help needed turning from your back to your side while in a flat bed without using bedrails?: Total Help needed moving from lying on your back to sitting on the side of a flat bed without using bedrails?: Total Help needed moving to and from a bed to a chair (including a wheelchair)?: Total Help needed standing up from a chair using your arms (e.g., wheelchair or bedside chair)?: Total Help needed to walk in hospital room?: Total Help needed climbing 3-5 steps with a railing? : Total 6 Click Score: 6    End of Session   Activity Tolerance: Patient tolerated treatment well Patient left: in bed;with call bell/phone within reach Nurse Communication: Mobility status;Need for lift equipment PT Visit Diagnosis: Muscle weakness (generalized) (M62.81);Difficulty in walking, not elsewhere classified (R26.2);Pain;Adult, failure  to thrive (R62.7) Pain - Right/Left: Left Pain - part of body: Knee;Leg;Hip     Time: 1400-1515 PT Time Calculation (min) (ACUTE ONLY): 75 min  Charges:  $Therapeutic Activity: 68-82 mins                     [name] PT Acute Rehabilitation Services Pager [phone] Office [phone]    [name] 11/04/2020, 4:02 PM

## 2020-11-04 NOTE — Progress Notes (Signed)
PROGRESS NOTE    [name]  [id number] DOB: 08-14-1965 DOA: 10/10/2020 PCP: Fanny Bien, MD    Chief Complaint  Patient presents with  . Wound Infection    Brief Narrative:  55 year old female morbidly obese type II diabetic with chronic lymphedema chronic nonhealing ulcers on both legs and calves and left upper thigh followed by wound clinic Dr. Asa Saunas was also being followed by home health nurses 3 times a week which has been terminated few weeks ago by insurance her insurance company. Now her wounds have become much larger with increased drainage and pain. She is unable to change the dressings by herself due to the location of the wound and morbid obesity. She does not walk she only moves around in the house in the rolling chair 1-2 times per day. She was not able to get to the restroom so she has been urinating in the bed which has made the wounds even much worse. She lives alone. She describes her pain as 10 out of 10 and has been receiving IV narcotics. Chronic wounds and multiple areas of skin breakdown. When patient came to the ER she had very old dressings still in place which were malodorous and had bloody drainage with chronic skin changes in the periwound area. She received Ancef in the ER, admitted for further management.  She's now off antibiotics.  Currently discharge is pending SNF placement.  Difficult placement given weight.   Assessment & Plan:   Principal Problem:   Wound infection Active Problems:   Hyperkalemia   Chronic peripheral venous hypertension with lower extremity complication   Lymphedema   Obesity   Type 2 diabetes mellitus (HCC)   HTN (hypertension)   Cellulitis   1 chronic bilateral lower extremity wounds/chronic lymphedema -On presentation patient noted to have increased pain, foul-smelling discharge from wounds on admission which has since improved.  Wound 100% pink and clean. -Status post 1 day of IV vancomycin  and IV cefepime (10/10/2020>>> 10/11/2020) -Patient seen by wound care with dressing changes recommended. -Lymphedema pumps, PT following. -Continue current pain management with oxycodone, Flexeril. -PT/OT.  2.  Type 2 diabetes mellitus Hemoglobin A1c 6.5 (10/10/2020) -CBG 183 this morning.   -Patient not on oral hypoglycemic agents prior to admission and per prior attending refusing to be started on metformin. -Continue current regimen of Lantus 18 units daily.  SSI.  3.  Hypertension -Blood pressure initially noted to be borderline.   -HCTZ discontinued.  Lasix discontinued.   -Continue metoprolol.  Follow.  4.  Mildly elevated D-dimer -Patient noted to have a mild tachycardia with heart rate in the low 100s. -improvement with heart rates. -D-dimer noted to be mildly elevated. -Lower extremity Dopplers were suboptimal study due to body habitus. -Patient noted to be 2 heavy for CT scan and as such unable to do a CT angiogram of chest. -Patient however low suspicion for PE as patient denies any chest pain, no significant shortness of breath, not hypoxic. -Continue DVT prophylaxis. Follow-up.  5.  Morbid obesity BMI 73. -Lifestyle modification advised and Dr. Florene Glen discussed extensively with patient on 10/22/2020 about options of weight loss including consultation for bariatric surgery. -Per Dr. Florene Glen patient completely refuses even consultation from bariatric surgery stating to him that she had some friends that passed away after surgery and currently not interested. -Outpatient follow-up.  6.  Hypomagnesemia/hypokalemia -Repleted.  Potassium at 3.5.   -HCTZ discontinued due to borderline blood pressure.   -Continue daily oral potassium supplementation.  7.  Pressure injuries, POA Pressure Injury 10/11/20 Thigh Left;Posterior (Active)  10/11/20 0041  Location: Thigh  Location Orientation: Left;Posterior  Staging:   Wound Description (Comments):   Present on Admission:       Pressure Injury 10/11/20 Tibial Left;Posterior;Proximal (Active)  10/11/20 0041  Location: Tibial  Location Orientation: Left;Posterior;Proximal  Staging:   Wound Description (Comments):   Present on Admission:    #8 constipation -Patient states had bowel movement just prior to Dulcolax suppository being given yesterday.   -Continue current bowel regimen of MiraLAX twice daily, Senokot-S twice daily.  #8 disposition -Patient lives alone and unable to care for herself -Patient noted on presentation to be unable to change the dressings on the wound due to location, morbid obesity, pain. -Patient noted to be nonambulatory. -Patient noted to be moving around in her Rollator couple of times during the day. -Patient noted to be unable to cook for herself as she is unable to stand and subsequently requires placement. -Patient is unsafe discharge back home alone. -Patient with no family close by that could assist with her. -PT following patient and working on schedule with nursing staff for lymphedema pumps. -Out of bed to chair and multiple tilts per day. -TOC consulted for placement.      DVT prophylaxis: Lovenox Code Status: Full Family Communication: Updated patient.  No family at bedside. Disposition:   Status is: Inpatient    Dispo: The patient is from: Home              Anticipated d/c is to: SNF              Patient medically stable.  Awaiting placement.    Difficult to place patient yes       Consultants:   Wound care RN: Para March, RN 10/11/2020  Procedures:   Lower extremity Dopplers 10/23/2020  Chest x-ray 10/16/2020  Antimicrobials:  Anti-infectives (From admission, onward)   Start     Dose/Rate Route Frequency Ordered Stop   10/11/20 1000  vancomycin (VANCOREADY) IVPB 2000 mg/400 mL  Status:  Discontinued        2,000 mg 200 mL/hr over 120 Minutes Intravenous Every 12 hours 10/10/20 2047 10/11/20 1140   10/11/20 0100  ceFEPIme (MAXIPIME) 2 g in  sodium chloride 0.9 % 100 mL IVPB  Status:  Discontinued        2 g 200 mL/hr over 30 Minutes Intravenous Every 8 hours 10/10/20 2001 10/11/20 1140   10/10/20 2100  vancomycin (VANCOCIN) 2,500 mg in sodium chloride 0.9 % 500 mL IVPB        2,500 mg 250 mL/hr over 120 Minutes Intravenous  Once 10/10/20 2001 10/11/20 0135   10/10/20 1545  ceFAZolin (ANCEF) IVPB 2g/100 mL premix        2 g 200 mL/hr over 30 Minutes Intravenous  Once 10/10/20 1530 10/10/20 1723       Subjective: Sitting up in chair.  No chest pain.  No shortness of breath.  Feels Voltaren gel is helping with knee pain.  Feels she is on too many pills and hoping some could be discontinued if not deemed necessary.  Tolerating current diet.  Patient states had bowel movement yesterday just prior to Dulcolax suppository being given.  Objective: Vitals:   11/03/20 0617 11/03/20 1304 11/03/20 2140 11/04/20 0541  BP: 120/65 111/61 114/67 118/75  Pulse: (!) 102 94 (!) 103 97  Resp: 18  16 15   Temp: 98.4 F (36.9 C) 98.4 F (36.9 C)  97.6 F (36.4 C) 98.8 F (37.1 C)  TempSrc:      SpO2: 90% 95% 92% 96%  Weight:      Height:        Intake/Output Summary (Last 24 hours) at 11/04/2020 1241 Last data filed at 11/04/2020 0600 Gross per 24 hour  Intake 960 ml  Output 450 ml  Net 510 ml   Filed Weights   10/10/20 1954 10/22/20 1531 11/01/20 0500  Weight: (!) 233.6 kg (!) 216.5 kg (!) 217.4 kg    Examination:  General exam: NAD Respiratory system: Lungs clear to auscultation bilaterally.  No wheezes, no crackles, no rhonchi.  Normal respiratory effort.  Cardiovascular system: RRR no murmurs rubs or gallops.  No JVD.  No lower extremity edema.  Gastrointestinal system: Abdomen is soft, obese, nontender, nondistended, positive bowel sounds.  No rebound.  No guarding.  Central nervous system: Alert and oriented. No focal neurological deficits. Extremities: Bilateral lymphedema.   Skin: Bilateral lower extremity with  chronic venous stasis changes.  Psychiatry: Judgement and insight appear normal. Mood & affect appropriate.     Data Reviewed: I have personally reviewed following labs and imaging studies  CBC: Recent Labs  Lab 10/30/20 0327 10/31/20 0315 11/03/20 0408  WBC 6.2 7.6 6.2  NEUTROABS 3.4 4.5 3.2  HGB 13.2 13.6 13.3  HCT 44.2 46.0 44.5  MCV 97.8 98.3 97.2  PLT 271 279 016    Basic Metabolic Panel: Recent Labs  Lab 10/30/20 0327 10/31/20 0315 11/01/20 0321 11/02/20 0247 11/03/20 0408 11/04/20 0311  NA 136 136 135 136 134* 140  K 3.5 3.4* 3.3* 3.4* 3.5 3.5  CL 100 99 98 98 99 102  CO2 25 31 26 30 25 27   GLUCOSE 200* 200* 185* 185* 180* 186*  BUN 22* 22* 19 20 21* 21*  CREATININE 0.76 0.78 0.90 1.06* 0.84 0.66  CALCIUM 9.3 9.5 9.4 9.3 9.1 9.5  MG 1.6* 1.9  --   --   --   --   PHOS 4.8*  --   --   --   --   --     GFR: Estimated Creatinine Clearance: 166.4 mL/min (by C-G formula based on SCr of 0.66 mg/dL).  Liver Function Tests: Recent Labs  Lab 10/30/20 0327  AST 17  ALT 15  ALKPHOS 63  BILITOT 0.8  PROT 7.7  ALBUMIN 3.4*    CBG: Recent Labs  Lab 11/03/20 1113 11/03/20 1634 11/03/20 2143 11/04/20 0733 11/04/20 1214  GLUCAP 190* 162* 199* 183* 185*     No results found for this or any previous visit (from the past 240 hour(s)).       Radiology Studies: No results found.      Scheduled Meds: . acetaminophen  1,000 mg Oral QHS  . bisacodyl  10 mg Rectal Once  . enoxaparin (LOVENOX) injection  100 mg Subcutaneous Q24H  . ferrous sulfate  325 mg Oral Daily  . hydrocortisone cream   Topical BID  . hydrOXYzine  10 mg Oral TID  . insulin aspart  0-20 Units Subcutaneous TID WC  . insulin aspart  0-5 Units Subcutaneous QHS  . insulin glargine  18 Units Subcutaneous Daily  . magnesium oxide  400 mg Oral Daily  . metoprolol tartrate  12.5 mg Oral BID  . polyethylene glycol  17 g Oral BID  . potassium chloride  40 mEq Oral Daily  .  senna-docusate  1 tablet Oral BID   Continuous Infusions: . sodium chloride  250 mL (10/15/20 2301)     LOS: 18 days    Time spent: 35 minutes    Irine Seal, MD [facility]   To contact the attending provider between 7A-7P or the covering provider during after hours 7P-7A, please log into the web site [email or web address] and access using universal [place] password for that web site. If you do not have the password, please call the hospital operator.  11/04/2020, 12:41 PM

## 2020-11-05 DIAGNOSIS — I87399 Chronic venous hypertension (idiopathic) with other complications of unspecified lower extremity: Secondary | ICD-10-CM | POA: Diagnosis not present

## 2020-11-05 DIAGNOSIS — I10 Essential (primary) hypertension: Secondary | ICD-10-CM | POA: Diagnosis not present

## 2020-11-05 DIAGNOSIS — T148XXA Other injury of unspecified body region, initial encounter: Secondary | ICD-10-CM | POA: Diagnosis not present

## 2020-11-05 DIAGNOSIS — I890 Lymphedema, not elsewhere classified: Secondary | ICD-10-CM | POA: Diagnosis not present

## 2020-11-05 LAB — BASIC METABOLIC PANEL
Anion gap: 9 (ref 5–15)
BUN: 18 mg/dL (ref 6–20)
CO2: 27 mmol/L (ref 22–32)
Calcium: 9.1 mg/dL (ref 8.9–10.3)
Chloride: 101 mmol/L (ref 98–111)
Creatinine, Ser: 0.85 mg/dL (ref 0.44–1.00)
GFR, Estimated: >60 mL/min (ref >60)
Glucose, Bld: 172 mg/dL — ABNORMAL HIGH (ref 70–99)
Potassium: 3.5 mmol/L (ref 3.5–5.1)
Sodium: 137 mmol/L (ref 135–145)

## 2020-11-05 LAB — MAGNESIUM: Magnesium: 1.6 mg/dL — ABNORMAL LOW (ref 1.7–2.4)

## 2020-11-05 LAB — GLUCOSE, CAPILLARY
Glucose-Capillary: 166 mg/dL — ABNORMAL HIGH (ref 70–99)
Glucose-Capillary: 178 mg/dL — ABNORMAL HIGH (ref 70–99)
Glucose-Capillary: 191 mg/dL — ABNORMAL HIGH (ref 70–99)
Glucose-Capillary: 163 mg/dL — ABNORMAL HIGH (ref 70–99)

## 2020-11-05 MED ORDER — MAGNESIUM OXIDE -MG SUPPLEMENT 400 (240 MG) MG PO TABS
400.0000 mg | ORAL_TABLET | Freq: Once | ORAL | Status: AC
  Administered 2020-11-05: 400 mg via ORAL
  Filled 2020-11-05: qty 1

## 2020-11-05 MED ORDER — MAGNESIUM SULFATE 4 GM/100ML IV SOLN
4.0000 g | Freq: Once | INTRAVENOUS | Status: DC
  Filled 2020-11-05: qty 100

## 2020-11-05 MED ORDER — MAGNESIUM OXIDE -MG SUPPLEMENT 400 (240 MG) MG PO TABS
400.0000 mg | ORAL_TABLET | Freq: Two times a day (BID) | ORAL | Status: DC
  Administered 2020-11-05 – 2020-11-27 (×45): 400 mg via ORAL
  Filled 2020-11-05 (×46): qty 1

## 2020-11-05 NOTE — Progress Notes (Signed)
Occupational Therapy Treatment Patient Details Name: [name] MRN: [id number] DOB: [birth year] Today's Date: 11/05/2020    History of present illness Pt admitted from home with multiple LE wounds, and FTT and unable to perform basic self care.  Pt reports she stayed in lift chair getting up only twice a day for BM or getting food.  Pt utilizing Rollator around home but pivoted to it from lift chair and wheeled around in seated position.  Pt states she put pads and diapers in chair so she did not have to get up to urinate.  Pt with hx of DM and lymphadema (has been unable to use her pressure sleeves so lymphadema has significantly worsened since last December).   OT comments  Patient progressing slowly but steadily and showed improved tolerance to upright sitting in recliner x~44min with feet supported on floor during UB bathing and grooming activities, compared to previous session. Patient remains limited by BLE pain and stiffness, and impaired activity tolerance and severe weakness, along with deficits noted below. Pt continues to demonstrate good rehab potential and would benefit from continued long term skilled OT to increase safety and independence with ADLs and functional transfers to allow pt to return home safely and reduce caregiver burden and fall risk.   Follow Up Recommendations  SNF    Equipment Recommendations  None recommended by OT    Recommendations for Other Services      Precautions / Restrictions Precautions Precautions: Fall Precaution Comments: posterior LE wounds, place bed pad  to cushion when maxiskying. Restrictions Weight Bearing Restrictions: No       Mobility Bed Mobility Overal bed mobility: Needs Assistance Bed Mobility: Rolling Rolling: Max assist;+2 for physical assistance;+2 for safety/equipment         General bed mobility comments: Patient working on assisting with rolling using upper extremities on bed rails and trunk movement though  still +2-4 assistance for rolling both left and right. Patient able to roll on back from sidelying with min assist and encouragement.  Pt attempting to pull herself forward in recliner with use of arm rests and encouraged to try ad lib for UB strengthening in addition to theraband exercises in bed.    Transfers Overall transfer level: Needs assistance               General transfer comment: transfer from bed via maxisky back to recliner    Balance                                           ADL either performed or assessed with clinical judgement   ADL       Grooming: Set up;Sitting;Oral care;Wash/dry face;Wash/dry hands;Applying deodorant Grooming Details (indicate cue type and reason): Performed while pt in recliner and sitting upright with feet on floor (supported by pillows on LT as LT could not reach floor completely) Upper Body Bathing: Sitting;Moderate assistance Upper Body Bathing Details (indicate cue type and reason): Pt performed sponge bathing to UB with need of Moderate assist for thoroughness under arms and under breasts. Pt quick to fatigue when raising arms to abduction for cleansing and required elbow held up to continue washing and applying deoderant. Lower Body Bathing: Total assistance;+2 for physical assistance;+2 for safety/equipment;Bed level Lower Body Bathing Details (indicate cue type and reason): Pt required assist of 4 people for thorough LB bathing at bed level. Upper Body  Dressing : Moderate assistance;Sitting Upper Body Dressing Details (indicate cue type and reason): Moderate assist to don anterior hospital gown in recliner. Lower Body Dressing: Total assistance;Bed level Lower Body Dressing Details (indicate cue type and reason): Total Assist to adjust socks.             Functional mobility during ADLs: Maximal assistance;Cueing for sequencing;+2 for physical assistance General ADL Comments: +4 for physical assist for bathing at  bed level. +3 for rolling Rt and LT in bed x 2 for bathing and to place hoyer pad. Maxi-sky to recliner.     Vision Patient Visual Report: No change from baseline     Perception     Praxis      Cognition Arousal/Alertness: Awake/alert Behavior During Therapy: WFL for tasks assessed/performed Overall Cognitive Status: Within Functional Limits for tasks assessed                                          Exercises     Shoulder Instructions       General Comments      Pertinent Vitals/ Pain       Pain Assessment: Faces Faces Pain Scale: Hurts whole lot Pain Location: B knees, hips, back (with ROM/activity/movement) Pain Descriptors / Indicators: Spasm Pain Intervention(s): Monitored during session;Limited activity within patient's tolerance;Repositioned;Relaxation (Cream applied per pt instructions. Pt cued in deep breathing.)  Home Living                                          Prior Functioning/Environment              Frequency  Min 2X/week        Progress Toward Goals  OT Goals(current goals can now be found in the care plan section)  Progress towards OT goals: Progressing toward goals  Acute Rehab OT Goals Patient Stated Goal: take manage myself and go home OT Goal Formulation: With patient Time For Goal Achievement: 11/13/20 Potential to Achieve Goals: [place] Discharge plan remains appropriate    Co-evaluation      Reason for Co-Treatment: For patient/therapist safety;To address functional/ADL transfers PT goals addressed during session: Mobility/safety with mobility OT goals addressed during session: ADL's and self-care      AM-PAC OT "6 Clicks" Daily Activity     Outcome Measure   Help from another person eating meals?: None Help from another person taking care of personal grooming?: A Little Help from another person toileting, which includes using toliet, bedpan, or urinal?: Total Help from  another person bathing (including washing, rinsing, drying)?: A Lot Help from another person to put on and taking off regular upper body clothing?: A Lot Help from another person to put on and taking off regular lower body clothing?: Total 6 Click Score: 13    End of Session    OT Visit Diagnosis: Other abnormalities of gait and mobility (R26.89);Muscle weakness (generalized) (M62.81);Pain   Activity Tolerance Patient limited by pain   Patient Left in chair;with call bell/phone within reach   Nurse Communication Mobility status;Need for lift equipment (Pt request for interdry under each breast.)        Time: 4431-5400 OT Time Calculation (min): 65 min  Charges: OT General Charges $OT Visit: 1 Visit OT Treatments $Self Care/Home Management :  23-37 mins $Therapeutic Activity: 23-37 mins  Anderson Malta, OT Acute Rehab Services Office: [phone] 11/05/2020   Julien Girt 11/05/2020, 2:23 PM

## 2020-11-05 NOTE — Progress Notes (Signed)
Physical Therapy Treatment Patient Details Name: [name] MRN: [id number] DOB: 06/11/1966 Today's Date: 11/05/2020    History of Present Illness Pt admitted from home with multiple LE wounds, and FTT and unable to perform basic self care.  Pt reports she stayed in lift chair getting up only twice a day for BM or getting food.  Pt utilizing Rollator around home but pivoted to it from lift chair and wheeled around in seated position.  Pt states she put pads and diapers in chair so she did not have to get up to urinate.  Pt with hx of DM and lymphadema (has been unable to use her pressure sleeves so lymphadema has significantly worsened since last December).    PT Comments    Patient assisted back to bed via lift. Patient tilted to max 46 * x 4 mins, tolerated 31-36 * for remainder of total 20 minutes tilted. Continue  Tilting tolerance.  Follow Up Recommendations  SNF     Equipment Recommendations  None recommended by PT    Recommendations for Other Services       Precautions / Restrictions Precautions Precautions: Fall Precaution Comments: posterior LE wounds, place bed pad  to cushion when maxiskying. Restrictions Weight Bearing Restrictions: No    Mobility  Bed Mobility Overal bed mobility: Needs Assistance Bed Mobility: Rolling Rolling: Max assist;+2 for physical assistance;+2 for safety/equipment         General bed mobility comments: Patient working on assisting with rolling using upper extremities on bed rails and trunk movement though still +2-4 assistance for rolling both left and right. Patient able to roll on back from sidelying with min assist and encouragement.  . Start Time: 1440 Angle: 45 degrees (x 4 min.) Total Minutes in Angle: 20 minutes  Transfers Overall transfer level: Needs assistance               General transfer comment: transfer from recliner to bed via [facility]  Ambulation/Gait                 Stairs              Wheelchair Mobility    Modified Rankin (Stroke Patients Only)       Balance                                            Cognition Arousal/Alertness: Awake/alert Behavior During Therapy: WFL for tasks assessed/performed Overall Cognitive Status: Within Functional Limits for tasks assessed                                        Exercises      General Comments        Pertinent Vitals/Pain Pain Assessment: Faces Faces Pain Scale: Hurts even more Pain Location: B knees,  back (with activity/movement, esp when laying more flat Pain Descriptors / Indicators: Spasm Pain Intervention(s): Premedicated before session;Repositioned    Home Living                      Prior Function            PT Goals (current goals can now be found in the care plan section) Acute Rehab PT Goals Patient Stated Goal: take manage myself and go home Progress  towards PT goals: Progressing toward goals    Frequency    Min 2X/week      PT Plan Current plan remains appropriate    Co-evaluation   Reason for Co-Treatment: For patient/therapist safety;To address functional/ADL transfers PT goals addressed during session: Mobility/safety with mobility OT goals addressed during session: ADL's and self-care      AM-PAC PT "6 Clicks" Mobility   Outcome Measure  Help needed turning from your back to your side while in a flat bed without using bedrails?: Total Help needed moving from lying on your back to sitting on the side of a flat bed without using bedrails?: Total Help needed moving to and from a bed to a chair (including a wheelchair)?: Total Help needed standing up from a chair using your arms (e.g., wheelchair or bedside chair)?: Total Help needed to walk in hospital room?: Total Help needed climbing 3-5 steps with a railing? : Total 6 Click Score: 6    End of Session   Activity Tolerance: Patient tolerated treatment well Patient  left: in bed;with call bell/phone within reach Nurse Communication: Mobility status;Need for lift equipment PT Visit Diagnosis: Muscle weakness (generalized) (M62.81);Difficulty in walking, not elsewhere classified (R26.2);Pain;Adult, failure to thrive (R62.7) Pain - Right/Left: Left Pain - part of body: Knee;Leg;Hip     Time: 1410-1530 PT Time Calculation (min) (ACUTE ONLY): 80 min  Charges:  $Therapeutic Activity: 68-82 mins                     [name] PT Acute Rehabilitation Services Pager [phone] Office [phone]    [name] 11/05/2020, 4:47 PM

## 2020-11-05 NOTE — Progress Notes (Signed)
PROGRESS NOTE    [name]  DDU:[id number] DOB: 12-Aug-1965 DOA: 10/10/2020 PCP: Fanny Bien, MD    Chief Complaint  Patient presents with  . Wound Infection    Brief Narrative:  55 year old female morbidly obese type II diabetic with chronic lymphedema chronic nonhealing ulcers on both legs and calves and left upper thigh followed by wound clinic Dr. Asa Saunas was also being followed by home health nurses 3 times a week which has been terminated few weeks ago by insurance her insurance company. Now her wounds have become much larger with increased drainage and pain. She is unable to change the dressings by herself due to the location of the wound and morbid obesity. She does not walk she only moves around in the house in the rolling chair 1-2 times per day. She was not able to get to the restroom so she has been urinating in the bed which has made the wounds even much worse. She lives alone. She describes her pain as 10 out of 10 and has been receiving IV narcotics. Chronic wounds and multiple areas of skin breakdown. When patient came to the ER she had very old dressings still in place which were malodorous and had bloody drainage with chronic skin changes in the periwound area. She received Ancef in the ER, admitted for further management.  She's now off antibiotics.  Currently discharge is pending SNF placement.  Difficult placement given weight.   Assessment & Plan:   Principal Problem:   Wound infection Active Problems:   Hyperkalemia   Chronic peripheral venous hypertension with lower extremity complication   Lymphedema   Obesity   Type 2 diabetes mellitus (HCC)   HTN (hypertension)   Cellulitis   1 chronic bilateral lower extremity wounds/chronic lymphedema -On presentation patient noted to have increased pain, foul-smelling discharge from wounds on admission which has since improved.  Wound 100% pink and clean. -Status post 1 day of IV vancomycin  and IV cefepime (10/10/2020>>> 10/11/2020) -Patient seen by wound care with dressing changes recommended. -Lymphedema pumps, PT following. -Continue current pain management with oxycodone, Flexeril. -PT/OT.  2.  Type 2 diabetes mellitus Hemoglobin A1c 6.5 (10/10/2020) -CBG 166 this morning.   -Patient not on oral hypoglycemic agents prior to admission and per prior attending refusing to be started on metformin. -Continue current regimen of Lantus and SSI.   3.  Hypertension -Blood pressure noted to be borderline and as such HCTZ discontinued.  Lasix has also been discontinued.   -Continue metoprolol.    4.  Mildly elevated D-dimer -Patient noted to have a mild tachycardia with heart rate in the low 100s. -improvement with heart rates. -D-dimer noted to be mildly elevated. -Lower extremity Dopplers were suboptimal study due to body habitus. -Patient noted to be 2 heavy for CT scan and as such unable to do a CT angiogram of chest. -Patient however low suspicion for PE as patient denies any chest pain, no significant shortness of breath, not hypoxic. -Continue DVT prophylaxis. Follow-up.  5.  Morbid obesity BMI 73. -Lifestyle modification advised and Dr. Florene Glen discussed extensively with patient on 10/22/2020 about options of weight loss including consultation for bariatric surgery. -Per Dr. Florene Glen patient completely refuses even consultation from bariatric surgery stating to him that she had some friends that passed away after surgery and currently not interested. -Outpatient follow-up.  6.  Hypomagnesemia/hypokalemia -Repleted.  Potassium at 3.5.   -HCTZ discontinued due to borderline blood pressure.   -Continue daily oral potassium supplementation.   -  Magnesium at 1.6, IV magnesium ordered however due to IV issues patient asking whether he could be supplemented orally.  Increase mag oxide to 400 mg twice daily.  Give her extra dose of oral mag oxide x1 today.   -Repeat labs in 1 to 2  days.  7.  Pressure injuries, POA Pressure Injury 10/11/20 Thigh Left;Posterior (Active)  10/11/20 0041  Location: Thigh  Location Orientation: Left;Posterior  Staging:   Wound Description (Comments):   Present on Admission:      Pressure Injury 10/11/20 Tibial Left;Posterior;Proximal (Active)  10/11/20 0041  Location: Tibial  Location Orientation: Left;Posterior;Proximal  Staging:   Wound Description (Comments):   Present on Admission:    #8 constipation -Patient states had bowel movement just prior to Dulcolax suppository being given yesterday.   -MiraLAX twice daily.  Senokot-S twice daily.    #8 disposition -Patient lives alone and unable to care for herself -Patient noted on presentation to be unable to change the dressings on the wound due to location, morbid obesity, pain. -Patient noted to be nonambulatory. -Patient noted to be moving around in her Rollator couple of times during the day. -Patient noted to be unable to cook for herself as she is unable to stand and subsequently requires placement. -Patient is unsafe discharge back home alone. -Patient with no family close by that could assist with her. -PT following patient and working on schedule with nursing staff for lymphedema pumps. -Out of bed to chair and multiple tilts per day. -TOC consulted for placement.      DVT prophylaxis: Lovenox Code Status: Full Family Communication: Updated patient.  No family at bedside. Disposition:   Status is: Inpatient    Dispo: The patient is from: Home              Anticipated d/c is to: SNF              Patient medically stable.  Awaiting placement.    Difficult to place patient yes       Consultants:   Wound care RN: Para March, RN 10/11/2020  Procedures:   Lower extremity Dopplers 10/23/2020  Chest x-ray 10/16/2020  Antimicrobials:  Anti-infectives (From admission, onward)   Start     Dose/Rate Route Frequency Ordered Stop   10/11/20 1000   vancomycin (VANCOREADY) IVPB 2000 mg/400 mL  Status:  Discontinued        2,000 mg 200 mL/hr over 120 Minutes Intravenous Every 12 hours 10/10/20 2047 10/11/20 1140   10/11/20 0100  ceFEPIme (MAXIPIME) 2 g in sodium chloride 0.9 % 100 mL IVPB  Status:  Discontinued        2 g 200 mL/hr over 30 Minutes Intravenous Every 8 hours 10/10/20 2001 10/11/20 1140   10/10/20 2100  vancomycin (VANCOCIN) 2,500 mg in sodium chloride 0.9 % 500 mL IVPB        2,500 mg 250 mL/hr over 120 Minutes Intravenous  Once 10/10/20 2001 10/11/20 0135   10/10/20 1545  ceFAZolin (ANCEF) IVPB 2g/100 mL premix        2 g 200 mL/hr over 30 Minutes Intravenous  Once 10/10/20 1530 10/10/20 1723       Subjective: Patient sitting up in chair.  Working with therapy.  Denies any chest pain.  No shortness of breath.  No abdominal pain.   Asking whether he could receive oral magnesium supplementation instead of IV magnesium today.   Objective: Vitals:   11/04/20 1439 11/04/20 2153 11/05/20 0500 11/05/20 0550  BP: Marland Kitchen)  151/78 110/60  137/72  Pulse: 95 91  97  Resp: 16 18  18   Temp: 97.7 F (36.5 C) 99.3 F (37.4 C)  97.8 F (36.6 C)  TempSrc:  Oral  Oral  SpO2: 95% 92%  92%  Weight:   (!) 217.4 kg   Height:        Intake/Output Summary (Last 24 hours) at 11/05/2020 1232 Last data filed at 11/05/2020 0600 Gross per 24 hour  Intake 840 ml  Output 1150 ml  Net -310 ml   Filed Weights   10/22/20 1531 11/01/20 0500 11/05/20 0500  Weight: (!) 216.5 kg (!) 217.4 kg (!) 217.4 kg    Examination:  General exam: NAD Respiratory system: CTA B.  No wheezes, no crackles, no rhonchi.  Normal respiratory effort.  Speaking in full sentences.   Cardiovascular system: Regular rate rhythm no murmurs rubs or gallops.  No JVD.  No lower extremity edema. Gastrointestinal system: Abdomen is obese, soft, nontender, nondistended, positive bowel sounds.  No rebound.  No guarding.  Central nervous system: Alert and oriented. No  focal neurological deficits. Extremities: Bilateral lymphedema.   Skin: Bilateral lower extremity with chronic venous stasis changes.  Psychiatry: Judgement and insight appear normal. Mood & affect appropriate.     Data Reviewed: I have personally reviewed following labs and imaging studies  CBC: Recent Labs  Lab 10/30/20 0327 10/31/20 0315 11/03/20 0408  WBC 6.2 7.6 6.2  NEUTROABS 3.4 4.5 3.2  HGB 13.2 13.6 13.3  HCT 44.2 46.0 44.5  MCV 97.8 98.3 97.2  PLT 271 279 0000000    Basic Metabolic Panel: Recent Labs  Lab 10/30/20 0327 10/31/20 0315 11/01/20 0321 11/02/20 0247 11/03/20 0408 11/04/20 0311 11/05/20 0324  NA 136 136 135 136 134* 140 137  K 3.5 3.4* 3.3* 3.4* 3.5 3.5 3.5  CL 100 99 98 98 99 102 101  CO2 25 31 26 30 25 27 27   GLUCOSE 200* 200* 185* 185* 180* 186* 172*  BUN 22* 22* 19 20 21* 21* 18  CREATININE 0.76 0.78 0.90 1.06* 0.84 0.66 0.85  CALCIUM 9.3 9.5 9.4 9.3 9.1 9.5 9.1  MG 1.6* 1.9  --   --   --   --  1.6*  PHOS 4.8*  --   --   --   --   --   --     GFR: Estimated Creatinine Clearance: 156.6 mL/min (by C-G formula based on SCr of 0.85 mg/dL).  Liver Function Tests: Recent Labs  Lab 10/30/20 0327  AST 17  ALT 15  ALKPHOS 63  BILITOT 0.8  PROT 7.7  ALBUMIN 3.4*    CBG: Recent Labs  Lab 11/04/20 1214 11/04/20 1639 11/04/20 2122 11/05/20 0753 11/05/20 1218  GLUCAP 185* 152* 155* 166* 178*     No results found for this or any previous visit (from the past 240 hour(s)).       Radiology Studies: No results found.      Scheduled Meds: . acetaminophen  1,000 mg Oral QHS  . enoxaparin (LOVENOX) injection  100 mg Subcutaneous Q24H  . ferrous sulfate  325 mg Oral Daily  . hydrocortisone cream   Topical BID  . hydrOXYzine  10 mg Oral TID  . insulin aspart  0-20 Units Subcutaneous TID WC  . insulin aspart  0-5 Units Subcutaneous QHS  . insulin glargine  18 Units Subcutaneous Daily  . magnesium oxide  400 mg Oral BID  .  metoprolol tartrate  12.5 mg  Oral BID  . polyethylene glycol  17 g Oral BID  . potassium chloride  40 mEq Oral Daily  . senna-docusate  1 tablet Oral BID   Continuous Infusions: . sodium chloride 250 mL (10/15/20 2301)  . magnesium sulfate bolus IVPB       LOS: 19 days    Time spent: 35 minutes    Irine Seal, MD [facility]   To contact the attending provider between 7A-7P or the covering provider during after hours 7P-7A, please log into the web site [email or web address] and access using universal [place] password for that web site. If you do not have the password, please call the hospital operator.  11/05/2020, 12:32 PM

## 2020-11-06 DIAGNOSIS — T148XXA Other injury of unspecified body region, initial encounter: Secondary | ICD-10-CM | POA: Diagnosis not present

## 2020-11-06 DIAGNOSIS — L089 Local infection of the skin and subcutaneous tissue, unspecified: Secondary | ICD-10-CM | POA: Diagnosis not present

## 2020-11-06 LAB — BASIC METABOLIC PANEL
Anion gap: 9 (ref 5–15)
BUN: 19 mg/dL (ref 6–20)
CO2: 27 mmol/L (ref 22–32)
Calcium: 8.9 mg/dL (ref 8.9–10.3)
Chloride: 101 mmol/L (ref 98–111)
Creatinine, Ser: 0.78 mg/dL (ref 0.44–1.00)
GFR, Estimated: >60 mL/min (ref >60)
Glucose, Bld: 211 mg/dL — ABNORMAL HIGH (ref 70–99)
Potassium: 3.4 mmol/L — ABNORMAL LOW (ref 3.5–5.1)
Sodium: 137 mmol/L (ref 135–145)

## 2020-11-06 LAB — CBC WITH DIFFERENTIAL/PLATELET
Abs Immature Granulocytes: 0.02 10*3/uL (ref 0.00–0.07)
Basophils Absolute: 0 10*3/uL (ref 0.0–0.1)
Basophils Relative: 1 %
Eosinophils Absolute: 0.3 10*3/uL (ref 0.0–0.5)
Eosinophils Relative: 6 %
HCT: 43.1 % (ref 36.0–46.0)
Hemoglobin: 12.9 g/dL (ref 12.0–15.0)
Immature Granulocytes: 0 %
Lymphocytes Relative: 38 %
Lymphs Abs: 2.2 10*3/uL (ref 0.7–4.0)
MCH: 29.2 pg (ref 26.0–34.0)
MCHC: 29.9 g/dL — ABNORMAL LOW (ref 30.0–36.0)
MCV: 97.5 fL (ref 80.0–100.0)
Monocytes Absolute: 0.5 10*3/uL (ref 0.1–1.0)
Monocytes Relative: 8 %
Neutro Abs: 2.8 10*3/uL (ref 1.7–7.7)
Neutrophils Relative %: 47 %
Platelets: 225 10*3/uL (ref 150–400)
RBC: 4.42 MIL/uL (ref 3.87–5.11)
RDW: 13.2 % (ref 11.5–15.5)
WBC: 5.9 10*3/uL (ref 4.0–10.5)
nRBC: 0 % (ref 0.0–0.2)

## 2020-11-06 LAB — GLUCOSE, CAPILLARY
Glucose-Capillary: 178 mg/dL — ABNORMAL HIGH (ref 70–99)
Glucose-Capillary: 145 mg/dL — ABNORMAL HIGH (ref 70–99)
Glucose-Capillary: 171 mg/dL — ABNORMAL HIGH (ref 70–99)
Glucose-Capillary: 156 mg/dL — ABNORMAL HIGH (ref 70–99)

## 2020-11-06 NOTE — Progress Notes (Signed)
PROGRESS NOTE    [name]  LHT:[id number] DOB: 23-Jan-1966 DOA: 10/10/2020 PCP: Fanny Bien, MD   Chief Complaint  Patient presents with  . Wound Infection   Brief Narrative:  55 year old female morbidly obese type II diabetic with chronic lymphedema chronic nonhealing ulcers on both legs and calves and left upper thigh followed by wound clinic Dr. Asa Saunas was also being followed by home health nurses 3 times [facility] week which has been terminated few weeks ago by insurance her insurance company. Now her wounds have become much larger with increased drainage and pain. She is unable to change the dressings by herself due to the location of the wound and morbid obesity. She does not walk she only moves around in the house in the rolling chair 1-2 times per day. She was not able to get to the restroom so she has been urinating in the bed which has made the wounds even much worse. She lives alone. She describes her pain as 10 out of 10 and has been receiving IV narcotics. Chronic wounds and multiple areas of skin breakdown. When patient came to the ER she had very old dressings still in place which were malodorous and had bloody drainage with chronic skin changes in the periwound area. She received Ancef in the ER, admitted for further management.  She's now off antibiotics. Currently discharge is pending SNF placement. Difficult placement given weight.  Assessment & Plan:   Principal Problem:   Wound infection Active Problems:   Hyperkalemia   Chronic peripheral venous hypertension with lower extremity complication   Lymphedema   Obesity   Type 2 diabetes mellitus (HCC)   HTN (hypertension)   Cellulitis  chronic bilateral lower extremity wounds/chronic lymphedema -On presentation patient noted to have increased pain, foul-smelling discharge from wounds on admission which has since improved.  Wound 100% pink and clean. -Status post 1 day of IV vancomycin and IV  cefepime (10/10/2020>>> 10/11/2020) -Patient seen by wound care with dressing changes recommended. -Lymphedema pumps, PT following (only had pumps placed on 1 occasion per her report, will need to follow up with RN) -Continue current pain management with oxycodone, robaxin. -PT/OT.  2.  Type 2 diabetes mellitus Hemoglobin A1c 6.5 (10/10/2020) -Patient not on oral hypoglycemic agents prior to admission and per prior attending refusing to be started on metformin. -Continue current regimen of Lantus and SSI.  - adjust as needed  3.  Hypertension -Blood pressure noted to be borderline and as such HCTZ discontinued.  Lasix has also been discontinued.   -Continue metoprolol.   - BP reasonable today  4.  Mildly elevated D-dimer -Patient noted to have Kenedy Haisley mild tachycardia with heart rate in the low 100s. -improvement with heart rates. -D-dimer noted to be mildly elevated. -Lower extremity Dopplers were suboptimal study due to body habitus. -Patient noted to be 2 heavy for CT scan and as such unable to do Amri Lien CT angiogram of chest. -Patient however low suspicion for PE as patient denies any chest pain, no significant shortness of breath, not hypoxic. -Continue DVT prophylaxis. Follow-up.  5.  Morbid obesity BMI 73. -Lifestyle modification advised.  Prior provider discussed extensively with patient on 10/22/2020 about options of weight loss including consultation for bariatric surgery.  Per prior provider patient completely refuses even consultation from bariatric surgery stating to him that she had some friends that passed away after surgery and currently not interested. -Outpatient follow-up.  6.  Hypomagnesemia/hypokalemia -Repleted.  Potassium at 3.5.   -HCTZ  discontinued due to borderline blood pressure.   -Continue daily oral potassium supplementation.   -Magnesium at 1.6, IV magnesium ordered however due to IV issues patient asking whether he could be supplemented orally.  Increase mag  oxide to 400 mg twice daily.  Give her extra dose of oral mag oxide x1 today.   -Repeat labs in 1 to 2 days.  7. Pressure Injuries Pressure Injury 10/11/20 Thigh Left;Posterior (Active)  10/11/20 0041  Location: Thigh  Location Orientation: Left;Posterior  Staging:   Wound Description (Comments):   Present on Admission:      Pressure Injury 10/11/20 Tibial Left;Posterior;Proximal (Active)  10/11/20 0041  Location: Tibial  Location Orientation: Left;Posterior;Proximal  Staging:   Wound Description (Comments):   Present on Admission:    #8 constipation -Patient states had bowel movement just prior to Dulcolax suppository being given yesterday.   -MiraLAX twice daily.  Senokot-S twice daily.    #8 disposition -Patient lives alone and unable to care for herself -Patient noted on presentation to be unable to change the dressings on the wound due to location, morbid obesity, pain. -Patient noted to be nonambulatory. -Patient noted to be moving around in her Rollator couple of times during the day. -Patient noted to be unable to cook for herself as she is unable to stand and subsequently requires placement. -Patient is unsafe discharge back home alone. -Patient with no family close by that could assist with her. -PT following patient and working on schedule with nursing staff for lymphedema pumps. -Out of bed to chair and multiple tilts per day. -TOC consulted for placement.  DVT prophylaxis: lovenox Code Status: full  Family Communication: none at bedside Disposition:   Status is: Inpatient  Remains inpatient appropriate because:Inpatient level of care appropriate due to severity of illness   Dispo: The patient is from: Home              Anticipated d/c is to: Home              Patient currently is not medically stable to d/c.   Difficult to place patient No       Consultants:   none  Procedures:  LE [place] Summary:  RIGHT:  - Suboptimal study due to patient body  habitus. Unable to compress  visualized veins.    LEFT:  - Suboptimal study due to patient body habitus. Unable to compress  visualized veins.     *See table(s) above for measurements and observations.   Antimicrobials:  Anti-infectives (From admission, onward)   Start     Dose/Rate Route Frequency Ordered Stop   10/11/20 1000  vancomycin (VANCOREADY) IVPB 2000 mg/400 mL  Status:  Discontinued        2,000 mg 200 mL/hr over 120 Minutes Intravenous Every 12 hours 10/10/20 2047 10/11/20 1140   10/11/20 0100  ceFEPIme (MAXIPIME) 2 g in sodium chloride 0.9 % 100 mL IVPB  Status:  Discontinued        2 g 200 mL/hr over 30 Minutes Intravenous Every 8 hours 10/10/20 2001 10/11/20 1140   10/10/20 2100  vancomycin (VANCOCIN) 2,500 mg in sodium chloride 0.9 % 500 mL IVPB        2,500 mg 250 mL/hr over 120 Minutes Intravenous  Once 10/10/20 2001 10/11/20 0135   10/10/20 1545  ceFAZolin (ANCEF) IVPB 2g/100 mL premix        2 g 200 mL/hr over 30 Minutes Intravenous  Once 10/10/20 1530 10/10/20 1723  Subjective: No new complaints Knee pain better since voltaren  Objective: Vitals:   11/05/20 1644 11/05/20 2029 11/06/20 0556 11/06/20 1358  BP: (!) 129/58 113/70 (!) 141/74 109/60  Pulse:  71 94 89  Resp:  17 17 16   Temp:  98.6 F (37 C) 97.7 F (36.5 C) 97.9 F (36.6 C)  TempSrc:      SpO2:  95% 93% 91%  Weight:      Height:        Intake/Output Summary (Last 24 hours) at 11/06/2020 1705 Last data filed at 11/06/2020 0600 Gross per 24 hour  Intake 480 ml  Output 550 ml  Net -70 ml   Filed Weights   11/01/20 0500 11/05/20 0500  Weight: (!) 217.4 kg (!) 217.4 kg    Examination:  General exam: Appears calm and comfortable  Respiratory system: Clear to auscultation. Respiratory effort normal. Cardiovascular system: S1 & S2 heard, RRR. Gastrointestinal system: Abdomen is nondistended, soft and nontender.  Central nervous system: Alert and oriented. No focal  neurological deficits. Extremities: bilateral LE edema - dressing to LLE Skin: No rashes, lesions or ulcers Psychiatry: Judgement and insight appear normal. Mood & affect appropriate.     Data Reviewed: I have personally reviewed following labs and imaging studies  CBC: Recent Labs  Lab 10/31/20 0315 11/03/20 0408 11/06/20 0312  WBC 7.6 6.2 5.9  NEUTROABS 4.5 3.2 2.8  HGB 13.6 13.3 12.9  HCT 46.0 44.5 43.1  MCV 98.3 97.2 97.5  PLT 279 [id number]    Basic Metabolic Panel: Recent Labs  Lab 10/31/20 0315 11/01/20 0321 11/02/20 0247 11/03/20 0408 11/04/20 0311 11/05/20 0324 11/06/20 0312  NA 136   < > 136 134* 140 137 137  K 3.4*   < > 3.4* 3.5 3.5 3.5 3.4*  CL 99   < > 98 99 102 101 101  CO2 31   < > 30 25 27 27 27   GLUCOSE 200*   < > 185* 180* 186* 172* 211*  BUN 22*   < > 20 21* 21* 18 19  CREATININE 0.78   < > 1.06* 0.84 0.66 0.85 0.78  CALCIUM 9.5   < > 9.3 9.1 9.5 9.1 8.9  MG 1.9  --   --   --   --  1.6*  --    < > = values in this interval not displayed.    GFR: Estimated Creatinine Clearance: 166.4 mL/min (by C-G formula based on SCr of 0.78 mg/dL).  Liver Function Tests: No results for input(s): AST, ALT, ALKPHOS, BILITOT, PROT, ALBUMIN in the last 168 hours.  CBG: Recent Labs  Lab 11/05/20 1218 11/05/20 1641 11/05/20 2027 11/06/20 0732 11/06/20 1400  GLUCAP 178* 191* 163* 178* 145*     No results found for this or any previous visit (from the past 240 hour(s)).       Radiology Studies: No results found.      Scheduled Meds: . acetaminophen  1,000 mg Oral QHS  . enoxaparin (LOVENOX) injection  100 mg Subcutaneous Q24H  . ferrous sulfate  325 mg Oral Daily  . hydrocortisone cream   Topical BID  . hydrOXYzine  10 mg Oral TID  . insulin aspart  0-20 Units Subcutaneous TID WC  . insulin aspart  0-5 Units Subcutaneous QHS  . insulin glargine  18 Units Subcutaneous Daily  . magnesium oxide  400 mg Oral BID  . metoprolol tartrate  12.5  mg Oral BID  . polyethylene glycol  17 g Oral BID  . potassium chloride  40 mEq Oral Daily  . senna-docusate  1 tablet Oral BID   Continuous Infusions: . sodium chloride 250 mL (10/15/20 2301)     LOS: 20 days    Time spent: over 30 min    Fayrene Helper, MD [facility]   To contact the attending provider between 7A-7P or the covering provider during after hours 7P-7A, please log into the web site [email or web address] and access using universal [place] password for that web site. If you do not have the password, please call the hospital operator.  11/06/2020, 5:05 PM

## 2020-11-06 NOTE — Progress Notes (Signed)
Physical Therapy Treatment Patient Details Name: [name] MRN: [id number] DOB: 12/27/1965 Today's Date: 11/06/2020    History of Present Illness Pt admitted from home with multiple LE wounds, and FTT and unable to perform basic self care.  Pt reports she stayed in lift chair getting up only twice a day for BM or getting food.  Pt utilizing Rollator around home but pivoted to it from lift chair and wheeled around in seated position.  Pt states she put pads and diapers in chair so she did not have to get up to urinate.  Pt with hx of DM and lymphadema (has been unable to use her pressure sleeves so lymphadema has significantly worsened since last December).    PT Comments    Pt  Progressing slowly. Slight incr effort with rolling although still requires cues and encouragement to participate. Tilt to 51* today. Worked on knee extension/quad sets/glut sets, abd contractions, head lifts while in tilt.    Follow Up Recommendations  SNF     Equipment Recommendations  None recommended by PT    Recommendations for Other Services       Precautions / Restrictions Precautions Precautions: Fall Precaution Comments: posterior LE wounds, place bed pad  to cushion when maxiskying.    Mobility  Bed Mobility Overal bed mobility: Needs Assistance Bed Mobility: Rolling Rolling: Max assist;+2 for physical assistance;+2 for safety/equipment         General bed mobility comments: Patient working on assisting with rolling using upper extremities on bed rails and trunk movement though still +2-4 assistance for rolling both left and right. Patient able to roll on back from sidelying with min- mod assist to uncross legs and encouragement.  . Start Time: 1450 Angle: 51 degrees Total Minutes in Angle: 6 minutes Patient Response: Anxious  Transfers                 General transfer comment: transfer from recliner to bed via [facility]  Ambulation/Gait                 Stairs              Wheelchair Mobility    Modified Rankin (Stroke Patients Only)       Balance                                            Cognition Arousal/Alertness: Awake/alert Behavior During Therapy: WFL for tasks assessed/performed Overall Cognitive Status: Within Functional Limits for tasks assessed                                        Exercises      General Comments        Pertinent Vitals/Pain Pain Assessment: Faces Pain Score: 9  Pain Location: B knees,  back (with activity/movement, esp when laying more flat Pain Descriptors / Indicators: Spasm Pain Intervention(s): Limited activity within patient's tolerance;Monitored during session;Premedicated before session;Repositioned    Home Living                      Prior Function            PT Goals (current goals can now be found in the care plan section) Acute Rehab PT Goals Patient Stated Goal: manage  myself and go home PT Goal Formulation: With patient Time For Goal Achievement: 11/07/20 Potential to Achieve Goals: Fair Progress towards PT goals: Progressing toward goals    Frequency    Min 2X/week      PT Plan Current plan remains appropriate    Co-evaluation              AM-PAC PT "6 Clicks" Mobility   Outcome Measure  Help needed turning from your back to your side while in a flat bed without using bedrails?: Total Help needed moving from lying on your back to sitting on the side of a flat bed without using bedrails?: Total Help needed moving to and from a bed to a chair (including a wheelchair)?: Total Help needed standing up from a chair using your arms (e.g., wheelchair or bedside chair)?: Total Help needed to walk in hospital room?: Total Help needed climbing 3-5 steps with a railing? : Total 6 Click Score: 6    End of Session   Activity Tolerance: Patient limited by pain Patient left: in bed;with call bell/phone within  reach Nurse Communication: Mobility status;Need for lift equipment PT Visit Diagnosis: Muscle weakness (generalized) (M62.81);Difficulty in walking, not elsewhere classified (R26.2);Pain;Adult, failure to thrive (R62.7) Pain - Right/Left: Left Pain - part of body: Knee;Leg;Hip     Time: 4742-5956 PT Time Calculation (min) (ACUTE ONLY): 51 min  Charges:  $Therapeutic Exercise: 38-52 mins                     Baxter Flattery, PT  Acute Rehab Dept ([place]) [phone] Pager [phone]  11/06/2020    [facility] 11/06/2020, 3:35 PM

## 2020-11-07 DIAGNOSIS — T148XXA Other injury of unspecified body region, initial encounter: Secondary | ICD-10-CM | POA: Diagnosis not present

## 2020-11-07 DIAGNOSIS — L089 Local infection of the skin and subcutaneous tissue, unspecified: Secondary | ICD-10-CM | POA: Diagnosis not present

## 2020-11-07 LAB — CBC WITH DIFFERENTIAL/PLATELET
Abs Immature Granulocytes: 0.01 10*3/uL (ref 0.00–0.07)
Basophils Absolute: 0 10*3/uL (ref 0.0–0.1)
Basophils Relative: 1 %
Eosinophils Absolute: 0.3 10*3/uL (ref 0.0–0.5)
Eosinophils Relative: 5 %
HCT: 44.2 % (ref 36.0–46.0)
Hemoglobin: 13.3 g/dL (ref 12.0–15.0)
Immature Granulocytes: 0 %
Lymphocytes Relative: 36 %
Lymphs Abs: 2.3 10*3/uL (ref 0.7–4.0)
MCH: 29.2 pg (ref 26.0–34.0)
MCHC: 30.1 g/dL (ref 30.0–36.0)
MCV: 97.1 fL (ref 80.0–100.0)
Monocytes Absolute: 0.5 10*3/uL (ref 0.1–1.0)
Monocytes Relative: 8 %
Neutro Abs: 3.1 10*3/uL (ref 1.7–7.7)
Neutrophils Relative %: 50 %
Platelets: 227 10*3/uL (ref 150–400)
RBC: 4.55 MIL/uL (ref 3.87–5.11)
RDW: 13.3 % (ref 11.5–15.5)
WBC: 6.3 10*3/uL (ref 4.0–10.5)
nRBC: 0 % (ref 0.0–0.2)

## 2020-11-07 LAB — COMPREHENSIVE METABOLIC PANEL
ALT: 22 U/L (ref 0–44)
AST: 25 U/L (ref 15–41)
Albumin: 3.3 g/dL — ABNORMAL LOW (ref 3.5–5.0)
Alkaline Phosphatase: 60 U/L (ref 38–126)
Anion gap: 7 (ref 5–15)
BUN: 20 mg/dL (ref 6–20)
CO2: 26 mmol/L (ref 22–32)
Calcium: 8.9 mg/dL (ref 8.9–10.3)
Chloride: 104 mmol/L (ref 98–111)
Creatinine, Ser: 0.88 mg/dL (ref 0.44–1.00)
GFR, Estimated: >60 mL/min (ref >60)
Glucose, Bld: 198 mg/dL — ABNORMAL HIGH (ref 70–99)
Potassium: 3.6 mmol/L (ref 3.5–5.1)
Sodium: 137 mmol/L (ref 135–145)
Total Bilirubin: 0.7 mg/dL (ref 0.3–1.2)
Total Protein: 7.4 g/dL (ref 6.5–8.1)

## 2020-11-07 LAB — GLUCOSE, CAPILLARY
Glucose-Capillary: 195 mg/dL — ABNORMAL HIGH (ref 70–99)
Glucose-Capillary: 188 mg/dL — ABNORMAL HIGH (ref 70–99)
Glucose-Capillary: 173 mg/dL — ABNORMAL HIGH (ref 70–99)
Glucose-Capillary: 159 mg/dL — ABNORMAL HIGH (ref 70–99)

## 2020-11-07 LAB — PHOSPHORUS: Phosphorus: 4.6 mg/dL (ref 2.5–4.6)

## 2020-11-07 LAB — MAGNESIUM: Magnesium: 1.7 mg/dL (ref 1.7–2.4)

## 2020-11-07 NOTE — TOC Progression Note (Signed)
Transition of Care [facility]) - Progression Note    Patient Details  Name: [name] MRN: [id number] Date of Birth: 1965/09/13  [facility] CM/SW Contact  Lennart Pall, LCSW Phone Number: 11/07/2020, 3:51 PM  Clinical Narrative:    Continue to work on SNF bed for pt. Have reached out to >150 SNFs in the state and no facility able to offer a bed.  Have discussed with TOC supervision and pt's case continues to be discussed in "Difficult to place" patient rounds weekly.  Therapies continue to work with pt with hopes that her mobility can be improved.     Expected Discharge Plan: [place] Barriers to Discharge: Continued Medical Work up  Expected Discharge Plan and Services Expected Discharge Plan: [place] In-house Referral: Clinical Social Work Discharge Planning Services: CM Consult Post Acute Care Choice: [place] Living arrangements for the past 2 months: Apartment                                       Social Determinants of Health (SDOH) Interventions    Readmission Risk Interventions No flowsheet data found.

## 2020-11-07 NOTE — Plan of Care (Signed)
  Problem: Health Behavior/Discharge Planning: Goal: Ability to manage health-related needs will improve Outcome: Progressing   Problem: Activity: Goal: Risk for activity intolerance will decrease Outcome: Progressing   Problem: Safety: Goal: Ability to remain free from injury will improve Outcome: Progressing   Problem: Skin Integrity: Goal: Risk for impaired skin integrity will decrease Outcome: Progressing

## 2020-11-07 NOTE — Progress Notes (Signed)
Physical Therapy Treatment Patient Details Name: [name] MRN: [id number] DOB: 24-Dec-1965 Today's Date: 11/07/2020    History of Present Illness Pt admitted from home with multiple LE wounds, and FTT and unable to perform basic self care.  Pt reports she stayed in lift chair getting up only twice a day for BM or getting food.  Pt utilizing Rollator around home but pivoted to it from lift chair and wheeled around in seated position.  Pt states she put pads and diapers in chair so she did not have to get up to urinate.  Pt with hx of DM and lymphadema (has been unable to use her pressure sleeves so lymphadema has significantly worsened since last December).    PT Comments    Patient making slow progress and was ready/eager for participation in therapy today despite expected knee pain with increased WB. At start of session pt jsut complete 1 round (1 hour) of use of LE compression pumps. Pt was able to tilt to ~30-45* for ~20 minutes with at lest 12 minutes spent at 45*. Patient completed functional UE toss with air bag and balled up socks alternating UE's. She isomtric exercises for core muscle including: glutes, quads, and transverse abdominis. Pt initiating reaching to use bed rail for rolling and swinging LE's for lower trunk roll but continues to require 2+ assist. RN/NT complete dressing change while rolling. Maxisky to recliner and pt educated on importance of sitting up ~2hours. Acute PT will continue to progress pt as able.    Follow Up Recommendations  SNF     Equipment Recommendations  None recommended by PT    Recommendations for Other Services       Precautions / Restrictions Precautions Precautions: Fall Precaution Comments: posterior LE wounds, place bed pad  to cushion when maxiskying. Restrictions Weight Bearing Restrictions: No    Mobility  Bed Mobility Overal bed mobility: Needs Assistance Bed Mobility: Rolling Rolling: Max assist;+2 for physical  assistance;+2 for safety/equipment         General bed mobility comments: Patient working on assisting with rolling using upper extremities on bed rails and initiating lower trunk movement however still +2 assistance for rolling Lt/Rt. Patient able to roll on back from sidelying with min-mod assist to uncross legs. Pt able to assist with bil LE lifts to position pillows/pads under lower legs for comfort and to protect skin. Start Time: 26 Angle: 45 degrees (30, 40, 45 (50% of time spent here) 35) Total Minutes in Angle: 20 minutes Patient Response: Cooperative  Transfers                 General transfer comment: transfer from recliner to bed via maxisky. bed pad placed at Lt knee to protect skin where pt has breakdown and wound.  Ambulation/Gait                 Stairs             Wheelchair Mobility    Modified Rankin (Stroke Patients Only)       Balance                                            Cognition Arousal/Alertness: Awake/alert Behavior During Therapy: WFL for tasks assessed/performed Overall Cognitive Status: Within Functional Limits for tasks assessed  Exercises Other Exercises Other Exercises: 10 reps with 5 sec holds glut set in tilt (30*) Other Exercises: 5 reps with 5 sec holds TA set in tilt (30*) Other Exercises: 5 reps bil LE with 3 sec holds quad set in tilt (30*) Other Exercises: Bil UE toss with filled bag and balled up socks, pt with sinlge UE support on bed rail while in 45* tilt. Rt UE tossign 75% ot time and cues to use Lt UE for some tossing. ~12 minutes of this.    General Comments        Pertinent Vitals/Pain Pain Assessment: Faces Faces Pain Scale: Hurts whole lot Pain Location: B knees,  back (with activity/movement, esp when laying more flat or rolling Pain Descriptors / Indicators: Spasm Pain Intervention(s): Limited activity within  patient's tolerance;Monitored during session;Repositioned    Home Living                      Prior Function            PT Goals (current goals can now be found in the care plan section) Acute Rehab PT Goals Patient Stated Goal: manage myself and go home PT Goal Formulation: With patient Time For Goal Achievement: 11/07/20 Potential to Achieve Goals: Fair Progress towards PT goals: Progressing toward goals    Frequency    Min 2X/week      PT Plan Current plan remains appropriate    Co-evaluation              AM-PAC PT "6 Clicks" Mobility   Outcome Measure  Help needed turning from your back to your side while in a flat bed without using bedrails?: Total Help needed moving from lying on your back to sitting on the side of a flat bed without using bedrails?: Total Help needed moving to and from a bed to a chair (including a wheelchair)?: Total Help needed standing up from a chair using your arms (e.g., wheelchair or bedside chair)?: Total Help needed to walk in hospital room?: Total Help needed climbing 3-5 steps with a railing? : Total 6 Click Score: 6    End of Session   Activity Tolerance: Patient limited by pain Patient left: in bed;with call bell/phone within reach Nurse Communication: Mobility status;Need for lift equipment PT Visit Diagnosis: Muscle weakness (generalized) (M62.81);Difficulty in walking, not elsewhere classified (R26.2);Pain;Adult, failure to thrive (R62.7) Pain - Right/Left: Left Pain - part of body: Knee;Leg;Hip     Time: 7408-1448 PT Time Calculation (min) (ACUTE ONLY): 75 min  Charges:  $Therapeutic Exercise: 23-37 mins $Therapeutic Activity: 23-37 mins                     Verner Mould, DPT Acute Rehabilitation Services Office [phone] Pager [phone]     Jacques Navy 11/07/2020, 7:06 PM

## 2020-11-07 NOTE — Progress Notes (Signed)
PROGRESS NOTE    [name]  GLO:[id number] DOB: 10-23-1965 DOA: 10/10/2020 PCP: Fanny Bien, MD   Chief Complaint  Patient presents with  . Wound Infection   Brief Narrative:  55 year old female morbidly obese type II diabetic with chronic lymphedema chronic nonhealing ulcers on both legs and calves and left upper thigh followed by wound clinic Dr. Asa Saunas was also being followed by home health nurses 3 times [place] week which has been terminated few weeks ago by insurance her insurance company. Now her wounds have become much larger with increased drainage and pain. She is unable to change the dressings by herself due to the location of the wound and morbid obesity. She does not walk she only moves around in the house in the rolling chair 1-2 times per day. She was not able to get to the restroom so she has been urinating in the bed which has made the wounds even much worse. She lives alone. She describes her pain as 10 out of 10 and has been receiving IV narcotics. Chronic wounds and multiple areas of skin breakdown. When patient came to the ER she had very old dressings still in place which were malodorous and had bloody drainage with chronic skin changes in the periwound area. She received Ancef in the ER, admitted for further management.  She's now off antibiotics. Currently discharge is pending SNF placement. Difficult placement given weight.  Assessment & Plan:   Principal Problem:   Wound infection Active Problems:   Hyperkalemia   Chronic peripheral venous hypertension with lower extremity complication   Lymphedema   Obesity   Type 2 diabetes mellitus (HCC)   HTN (hypertension)   Cellulitis  chronic bilateral lower extremity wounds/chronic lymphedema -On presentation patient noted to have increased pain, foul-smelling discharge from wounds on admission which has since improved.  Wound 100% pink and clean. -Status post 1 day of IV vancomycin and IV  cefepime (10/10/2020>>> 10/11/2020) -Patient seen by wound care with dressing changes recommended. -Lymphedema pumps, PT following (only had pumps placed on 1 occasion per her report, will need to follow up with RN) -Continue current pain management with oxycodone, robaxin. -PT/OT.  2.  Type 2 diabetes mellitus Hemoglobin A1c 6.5 (10/10/2020) -Patient not on oral hypoglycemic agents prior to admission and per prior attending refusing to be started on metformin. -Continue current regimen of Lantus and SSI.  - adjust as needed  3.  Hypertension -Blood pressure noted to be borderline and as such HCTZ discontinued.  Lasix has also been discontinued.   -Continue metoprolol.   - BP reasonable today  4.  Mildly elevated D-dimer -Patient noted to have Hatcher Froning mild tachycardia with heart rate in the low 100s. -improvement with heart rates. -D-dimer noted to be mildly elevated. -Lower extremity Dopplers were suboptimal study due to body habitus. -Patient noted to be 2 heavy for CT scan and as such unable to do Renley Gutman CT angiogram of chest. -Patient however low suspicion for PE as patient denies any chest pain, no significant shortness of breath, not hypoxic. -Continue DVT prophylaxis. Follow-up.  5.  Morbid obesity BMI 73. -Lifestyle modification advised.  Prior provider discussed extensively with patient on 10/22/2020 about options of weight loss including consultation for bariatric surgery.  Per prior provider patient completely refuses even consultation from bariatric surgery stating to him that she had some friends that passed away after surgery and currently not interested. -Outpatient follow-up.  6.  Hypomagnesemia/hypokalemia - replace and follow  -HCTZ discontinued due  to borderline blood pressure.   -Continue daily oral potassium supplementation.   -continue oral mag supplementation -Repeat labs in 1 to 2 days.  7. Pressure Injuries Pressure Injury 10/11/20 Thigh Left;Posterior (Active)   10/11/20 0041  Location: Thigh  Location Orientation: Left;Posterior  Staging:   Wound Description (Comments):   Present on Admission:      Pressure Injury 10/11/20 Tibial Left;Posterior;Proximal (Active)  10/11/20 0041  Location: Tibial  Location Orientation: Left;Posterior;Proximal  Staging:   Wound Description (Comments):   Present on Admission:    #8 constipation -MiraLAX twice daily.  Senokot-S twice daily.    #8 disposition -Patient lives alone and unable to care for herself -Patient noted on presentation to be unable to change the dressings on the wound due to location, morbid obesity, pain. -Patient noted to be nonambulatory. -Patient noted to be moving around in her Rollator couple of times during the day. -Patient noted to be unable to cook for herself as she is unable to stand and subsequently requires placement. -Patient is unsafe discharge back home alone. -Patient with no family close by that could assist with her. -PT following patient and working on schedule with nursing staff for lymphedema pumps. -Out of bed to chair and multiple tilts per day. -TOC consulted for placement.  DVT prophylaxis: lovenox Code Status: full  Family Communication: none at bedside Disposition:   Status is: Inpatient  Remains inpatient appropriate because:Inpatient level of care appropriate due to severity of illness   Dispo: The patient is from: Home              Anticipated d/c is to: Home              Patient currently is not medically stable to d/c.   Difficult to place patient No       Consultants:   none  Procedures:  LE [place] Summary:  RIGHT:  - Suboptimal study due to patient body habitus. Unable to compress  visualized veins.    LEFT:  - Suboptimal study due to patient body habitus. Unable to compress  visualized veins.     *See table(s) above for measurements and observations.   Antimicrobials:  Anti-infectives (From admission, onward)   Start      Dose/Rate Route Frequency Ordered Stop   10/11/20 1000  vancomycin (VANCOREADY) IVPB 2000 mg/400 mL  Status:  Discontinued        2,000 mg 200 mL/hr over 120 Minutes Intravenous Every 12 hours 10/10/20 2047 10/11/20 1140   10/11/20 0100  ceFEPIme (MAXIPIME) 2 g in sodium chloride 0.9 % 100 mL IVPB  Status:  Discontinued        2 g 200 mL/hr over 30 Minutes Intravenous Every 8 hours 10/10/20 2001 10/11/20 1140   10/10/20 2100  vancomycin (VANCOCIN) 2,500 mg in sodium chloride 0.9 % 500 mL IVPB        2,500 mg 250 mL/hr over 120 Minutes Intravenous  Once 10/10/20 2001 10/11/20 0135   10/10/20 1545  ceFAZolin (ANCEF) IVPB 2g/100 mL premix        2 g 200 mL/hr over 30 Minutes Intravenous  Once 10/10/20 1530 10/10/20 1723         Subjective: No new complaitns  Objective: Vitals:   11/06/20 0556 11/06/20 1358 11/06/20 2148 11/07/20 0556  BP: (!) 141/74 109/60 116/71 (!) 141/74  Pulse: 94 89 97 94  Resp: 17 16 17 17   Temp: 97.7 F (36.5 C) 97.9 F (36.6 C) 98.7 F (37.1 C)  97.7 F (36.5 C)  TempSrc:   Oral Oral  SpO2: 93% 91% 97% 93%  Weight:      Height:        Intake/Output Summary (Last 24 hours) at 11/07/2020 1902 Last data filed at 11/07/2020 0957 Gross per 24 hour  Intake 990 ml  Output 350 ml  Net 640 ml   Filed Weights   11/01/20 0500 11/05/20 0500  Weight: (!) 217.4 kg (!) 217.4 kg    Examination:  General: No acute distress. Seen while working with PT Cardiovascular: Heart sounds show Beck Cofer regular rate, and rhythm.  Lungs: Clear to auscultation bilaterally Abdomen: Soft, nontender, nondistended  Neurological: Alert and oriented 3. Moves all extremities 4. Cranial nerves II through XII grossly intact. Skin: Warm and dry. No rashes or lesions. Extremities: No clubbing or cyanosis. No edema.   Data Reviewed: I have personally reviewed following labs and imaging studies  CBC: Recent Labs  Lab 11/03/20 0408 11/06/20 0312 11/07/20 0313  WBC 6.2 5.9 6.3   NEUTROABS 3.2 2.8 3.1  HGB 13.3 12.9 13.3  HCT 44.5 43.1 44.2  MCV 97.2 97.5 97.1  PLT 243 225 [id number]    Basic Metabolic Panel: Recent Labs  Lab 11/03/20 0408 11/04/20 0311 11/05/20 0324 11/06/20 0312 11/07/20 0313  NA 134* 140 137 137 137  K 3.5 3.5 3.5 3.4* 3.6  CL 99 102 101 101 104  CO2 25 27 27 27 26   GLUCOSE 180* 186* 172* 211* 198*  BUN 21* 21* 18 19 20   CREATININE 0.84 0.66 0.85 0.78 0.88  CALCIUM 9.1 9.5 9.1 8.9 8.9  MG  --   --  1.6*  --  1.7  PHOS  --   --   --   --  4.6    GFR: Estimated Creatinine Clearance: 151.2 mL/min (by C-G formula based on SCr of 0.88 mg/dL).  Liver Function Tests: Recent Labs  Lab 11/07/20 0313  AST 25  ALT 22  ALKPHOS 60  BILITOT 0.7  PROT 7.4  ALBUMIN 3.3*    CBG: Recent Labs  Lab 11/06/20 1811 11/06/20 2150 11/07/20 0723 11/07/20 1155 11/07/20 1639  GLUCAP 171* 156* 195* 188* 173*     No results found for this or any previous visit (from the past 240 hour(s)).       Radiology Studies: No results found.      Scheduled Meds: . acetaminophen  1,000 mg Oral QHS  . enoxaparin (LOVENOX) injection  100 mg Subcutaneous Q24H  . ferrous sulfate  325 mg Oral Daily  . hydrocortisone cream   Topical BID  . hydrOXYzine  10 mg Oral TID  . insulin aspart  0-20 Units Subcutaneous TID WC  . insulin aspart  0-5 Units Subcutaneous QHS  . insulin glargine  18 Units Subcutaneous Daily  . magnesium oxide  400 mg Oral BID  . metoprolol tartrate  12.5 mg Oral BID  . polyethylene glycol  17 g Oral BID  . potassium chloride  40 mEq Oral Daily  . senna-docusate  1 tablet Oral BID   Continuous Infusions: . sodium chloride 250 mL (10/15/20 2301)     LOS: 21 days    Time spent: over 30 min    Fayrene Helper, MD [facility]   To contact the attending provider between 7A-7P or the covering provider during after hours 7P-7A, please log into the web site [email or web address] and access using universal [place]  password for that web site. If you do not have  the password, please call the hospital operator.  11/07/2020, 7:02 PM

## 2020-11-08 DIAGNOSIS — L089 Local infection of the skin and subcutaneous tissue, unspecified: Secondary | ICD-10-CM | POA: Diagnosis not present

## 2020-11-08 DIAGNOSIS — T148XXA Other injury of unspecified body region, initial encounter: Secondary | ICD-10-CM | POA: Diagnosis not present

## 2020-11-08 LAB — GLUCOSE, CAPILLARY
Glucose-Capillary: 184 mg/dL — ABNORMAL HIGH (ref 70–99)
Glucose-Capillary: 175 mg/dL — ABNORMAL HIGH (ref 70–99)
Glucose-Capillary: 169 mg/dL — ABNORMAL HIGH (ref 70–99)
Glucose-Capillary: 162 mg/dL — ABNORMAL HIGH (ref 70–99)

## 2020-11-08 NOTE — Progress Notes (Signed)
Occupational Therapy Treatment Patient Details Name: [name] MRN: [id number] DOB: September 29, 1965 Today's Date: 11/08/2020    History of present illness Pt admitted from home with multiple LE wounds, and FTT and unable to perform basic self care.  Pt reports she stayed in lift chair getting up only twice a day for BM or getting food.  Pt utilizing Rollator around home but pivoted to it from lift chair and wheeled around in seated position.  Pt states she put pads and diapers in chair so she did not have to get up to urinate.  Pt with hx of DM and lymphadema (has been unable to use her pressure sleeves so lymphadema has significantly worsened since last December).   OT comments  Treatment focused on having patient initiate and attempt more lower body movement with rolling to place hoyer pad. Improvement with assistance with rolling to the left but more limited to the right. She is able to use upper extremities on bed rails - when she can reach them - and needs hand hold assisst to pull on. She is still unable to assist significantly with LEs in rolling though maybe slightly when rolling to the right. With pain when rolling - patient tightens muscles locking her hips into adduction and knees flex causing ankles to cross. Patient unable to get right foot over left in that position and also unable to tolerate therapist moving lower extremity due to pain in knee and hip. Patient reports this response is unintentional and takes a while for her to relax muscles again and not resist movement. Patient hoyered to chair. Patient placed in upright seated position with feet on the floor - requiring increased time to transition from knee extension into knee flexion. In chair patient worked on leaning forward and activating core muscles and using upper extremities to pull forward as well as lifting foot onto a target to work on strengthening and use of LEs. Frequent rest breaks needed due to pain and education in  regards to POC, making minor improvements and focusing on functional goals.  Patient was able to tolerate improved seated upright position with feet on the ground for approx 45 minutes during education and exercises. Slow gains are being made.       Follow Up Recommendations  SNF    Equipment Recommendations  None recommended by OT    Recommendations for Other Services      Precautions / Restrictions Precautions Precautions: Fall Precaution Comments: posterior LE wounds, place bed pad  to cushion when maxiskying. Restrictions Weight Bearing Restrictions: No       Mobility Bed Mobility Overal bed mobility: Needs Assistance Bed Mobility: Rolling Rolling: Max assist;+2 for physical assistance;+2 for safety/equipment         General bed mobility comments: Patient able to assist with rolling - using rails and hand hold to pull herself over - better to the left than the right. In regards to lower extremities patient needs max assist x 2 to roll lower body. With rolling/pain/spasms patient's LE muscles tighten with effort causing ankles to cross. Patient unable to advance RLE toward rail due to ankles crossed position - right foot blocking left foot movement. Requires therapist to lift leg/foot over the other - though patient does not tolerate this movement due pain in knee and hip. With pain patient exhibits abnormal muscle recruitment tightening all muscles (reportedly  this is unitential) resisting movement. Patient able to return to her back by shoving off of the rails. Roling to the  right required +3 assistance. Patient unable to reach right rail with left arm due to limited trunk movement and needs hand hold assist to pull on and +2 assistance for body roll.    Transfers Overall transfer level: Needs assistance               General transfer comment: hoyer to recliner. +2 to 3 assistance for transfer.    Balance                                            ADL either performed or assessed with clinical judgement   ADL                                               Vision Patient Visual Report: No change from baseline     Perception     Praxis      Cognition Arousal/Alertness: Awake/alert Behavior During Therapy: WFL for tasks assessed/performed Overall Cognitive Status: Within Functional Limits for tasks assessed                                          Exercises Other Exercises Other Exercises: Seated upright in chair: knee extension stretch x 1 each leg. Difficulty actively extending knee to approx 45 degrees and needs assistance. Verbal cues to relax and not resist movement. Patient provided with a target to lift foot onto (approx a 2.5 inch rail) and lifted each foot onto target 10 times. Other Exercises: In upright seating: performed reaching to horizontal bar placed in front of her (stedy turned backwards, feet on the stedy plate) held onto with underhand grasp and pulled trunk forward/activated core/bicep curls x 5 with 5 second hold x 2 sets.   Shoulder Instructions       General Comments      Pertinent Vitals/ Pain       Pain Assessment: Faces Faces Pain Scale: Hurts whole lot Pain Location: Bilateral knees, hips and back with active use/movement Pain Descriptors / Indicators: Spasm;Grimacing;Guarding Pain Intervention(s): Monitored during session;Premedicated before session;Repositioned;Heat applied  Home Living                                          Prior Functioning/Environment              Frequency  Min 2X/week        Progress Toward Goals  OT Goals(current goals can now be found in the care plan section)  Progress towards OT goals: Progressing toward goals  Acute Rehab OT Goals Patient Stated Goal: manage myself and go home OT Goal Formulation: With patient Time For Goal Achievement: 11/13/20 Potential to Achieve Goals: [place]  Discharge plan remains appropriate    Co-evaluation          OT goals addressed during session: Strengthening/ROM (functional mobility)      AM-PAC OT "6 Clicks" Daily Activity     Outcome Measure   Help from another person eating meals?: None Help from another person taking care of personal grooming?: A Little Help  from another person toileting, which includes using toliet, bedpan, or urinal?: Total Help from another person bathing (including washing, rinsing, drying)?: A Lot Help from another person to put on and taking off regular upper body clothing?: A Lot Help from another person to put on and taking off regular lower body clothing?: Total 6 Click Score: 13    End of Session    OT Visit Diagnosis: Other abnormalities of gait and mobility (R26.89);Muscle weakness (generalized) (M62.81);Pain   Activity Tolerance Patient limited by pain   Patient Left in chair;with call bell/phone within reach   Nurse Communication Mobility status;Need for lift equipment        Time: [phone] OT Time Calculation (min): 75 min  Charges: OT General Charges $OT Visit: 1 Visit OT Treatments $Therapeutic Activity: 38-52 mins $Therapeutic Exercise: 23-37 mins  Theresia Pree, OTR/L Acute Care Rehab Services  Office [phone] Pager: [place] 11/08/2020, 1:32 PM

## 2020-11-08 NOTE — Progress Notes (Signed)
PROGRESS NOTE    [name]  JOA:[id number] DOB: 07-Jul-1965 DOA: 10/10/2020 PCP: Sierra Bien, MD   Chief Complaint  Patient presents with  . Wound Infection   Brief Narrative:  55 year old female morbidly obese type II diabetic with chronic lymphedema chronic nonhealing ulcers on both legs and calves and left upper thigh followed by wound clinic Dr. Asa Ware was also being followed by home health nurses 3 times [name] week which has been terminated few weeks ago by insurance her insurance company. Now her wounds have become much larger with increased drainage and pain. She is unable to change the dressings by herself due to the location of the wound and morbid obesity. She does not walk she only moves around in the house in the rolling chair 1-2 times per day. She was not able to get to the restroom so she has been urinating in the bed which has made the wounds even much worse. She lives alone. She describes her pain as 10 out of 10 and has been receiving IV narcotics. Chronic wounds and multiple areas of skin breakdown. When patient came to the ER she had very old dressings still in place which were malodorous and had bloody drainage with chronic skin changes in the periwound area. She received Ancef in the ER, admitted for further management.  She's now off antibiotics. Currently discharge is pending SNF placement. Difficult placement given weight.  Assessment & Plan:   Principal Problem:   Wound infection Active Problems:   Hyperkalemia   Chronic peripheral venous hypertension with lower extremity complication   Lymphedema   Obesity   Type 2 diabetes mellitus (HCC)   HTN (hypertension)   Cellulitis  chronic bilateral lower extremity wounds/chronic lymphedema -On presentation patient noted to have increased pain, foul-smelling discharge from wounds on admission which has since improved.  Wound 100% pink and clean. -Status post 1 day of IV vancomycin and IV  cefepime (10/10/2020>>> 10/11/2020) -Patient seen by wound care with dressing changes recommended. -Lymphedema pumps, PT following (only had pumps placed on 1 occasion per her report, will need to follow up with RN) -Continue current pain management with oxycodone, robaxin. -PT/OT.  2.  Type 2 diabetes mellitus Hemoglobin A1c 6.5 (10/10/2020) -Patient not on oral hypoglycemic agents prior to admission and per prior attending refusing to be started on metformin. -Continue current regimen of Lantus and SSI.  - adjust as needed  3.  Hypertension -Blood pressure noted to be borderline and as such HCTZ discontinued.  Lasix has also been discontinued.   -Continue metoprolol.   - BP reasonable today  4.  Mildly elevated D-dimer -Patient noted to have [name] mild tachycardia with heart rate in the low 100s. -improvement with heart rates. -D-dimer noted to be mildly elevated. -Lower extremity Dopplers were suboptimal study due to body habitus. -Patient noted to be 2 heavy for CT scan and as such unable to do [name] CT angiogram of chest. -Patient however low suspicion for PE as patient denies any chest pain, no significant shortness of breath, not hypoxic. -Continue DVT prophylaxis. Follow-up.  5.  Morbid obesity BMI 73. -Lifestyle modification advised.  Prior provider discussed extensively with patient on 10/22/2020 about options of weight loss including consultation for bariatric surgery.  Per prior provider patient completely refuses even consultation from bariatric surgery stating to him that she had some friends that passed away after surgery and currently not interested. -Outpatient follow-up.  6.  Hypomagnesemia/hypokalemia - replace and follow  -HCTZ discontinued due  to borderline blood pressure.   -Continue daily oral potassium supplementation.   -continue oral mag supplementation -Repeat labs in 1 to 2 days.  7. Pressure Injuries Pressure Injury 10/11/20 Thigh Left;Posterior (Active)   10/11/20 0041  Location: Thigh  Location Orientation: Left;Posterior  Staging:   Wound Description (Comments):   Present on Admission:      Pressure Injury 10/11/20 Tibial Left;Posterior;Proximal (Active)  10/11/20 0041  Location: Tibial  Location Orientation: Left;Posterior;Proximal  Staging:   Wound Description (Comments):   Present on Admission:    #8 constipation -MiraLAX twice daily.  Senokot-S twice daily.    #8 disposition -Patient lives alone and unable to care for herself -Patient noted on presentation to be unable to change the dressings on the wound due to location, morbid obesity, pain. -Patient noted to be nonambulatory. -Patient noted to be moving around in her Rollator couple of times during the day. -Patient noted to be unable to cook for herself as she is unable to stand and subsequently requires placement. -Patient is unsafe discharge back home alone. -Patient with no family close by that could assist with her. -PT following patient and working on schedule with nursing staff for lymphedema pumps. -Out of bed to chair and multiple tilts per day. -TOC consulted for placement.  DVT prophylaxis: lovenox Code Status: full  Family Communication: none at bedside Disposition:   Status is: Inpatient  Remains inpatient appropriate because:Inpatient level of care appropriate due to severity of illness   Dispo: The patient is from: Home              Anticipated d/c is to: Home              Patient currently is not medically stable to d/c.   Difficult to place patient No       Consultants:   none  Procedures:  LE [place] Summary:  RIGHT:  - Suboptimal study due to patient body habitus. Unable to compress  visualized veins.    LEFT:  - Suboptimal study due to patient body habitus. Unable to compress  visualized veins.     *See table(s) above for measurements and observations.   Antimicrobials:  Anti-infectives (From admission, onward)   Start      Dose/Rate Route Frequency Ordered Stop   10/11/20 1000  vancomycin (VANCOREADY) IVPB 2000 mg/400 mL  Status:  Discontinued        2,000 mg 200 mL/hr over 120 Minutes Intravenous Every 12 hours 10/10/20 2047 10/11/20 1140   10/11/20 0100  ceFEPIme (MAXIPIME) 2 g in sodium chloride 0.9 % 100 mL IVPB  Status:  Discontinued        2 g 200 mL/hr over 30 Minutes Intravenous Every 8 hours 10/10/20 2001 10/11/20 1140   10/10/20 2100  vancomycin (VANCOCIN) 2,500 mg in sodium chloride 0.9 % 500 mL IVPB        2,500 mg 250 mL/hr over 120 Minutes Intravenous  Once 10/10/20 2001 10/11/20 0135   10/10/20 1545  ceFAZolin (ANCEF) IVPB 2g/100 mL premix        2 g 200 mL/hr over 30 Minutes Intravenous  Once 10/10/20 1530 10/10/20 1723         Subjective: No complaints Working with therapy   Objective: Vitals:   11/07/20 2156 11/08/20 0620 11/08/20 1350 11/08/20 1419  BP: 140/61 136/62 (!) 117/25 107/71  Pulse: 92 95 88 92  Resp: 18 18 20    Temp: 97.6 F (36.4 C) 97.6 F (36.4 C) 98.7 F (  37.1 C)   TempSrc: Oral Oral Oral   SpO2: 96% 92% 95%   Weight:      Height:        Intake/Output Summary (Last 24 hours) at 11/08/2020 1821 Last data filed at 11/08/2020 1430 Gross per 24 hour  Intake 720 ml  Output 800 ml  Net -80 ml   Filed Weights   11/01/20 0500 11/05/20 0500  Weight: (!) 217.4 kg (!) 217.4 kg    Examination:  General: No acute distress. Cardiovascular: RRR Lungs: unlabored Abdomen: protuberant Neurological: Alert and oriented 3. Moves all extremities 4. Cranial nerves II through XII grossly intact. Skin: Warm and dry. No rashes or lesions. Extremities: bilateral LE edema, nonpitting   Data Reviewed: I have personally reviewed following labs and imaging studies  CBC: Recent Labs  Lab 11/03/20 0408 11/06/20 0312 11/07/20 0313  WBC 6.2 5.9 6.3  NEUTROABS 3.2 2.8 3.1  HGB 13.3 12.9 13.3  HCT 44.5 43.1 44.2  MCV 97.2 97.5 97.1  PLT 243 225 227    Basic  Metabolic Panel: Recent Labs  Lab 11/03/20 0408 11/04/20 0311 11/05/20 0324 11/06/20 0312 11/07/20 0313  NA 134* 140 137 137 137  K 3.5 3.5 3.5 3.4* 3.6  CL 99 102 101 101 104  CO2 25 27 27 27 26   GLUCOSE 180* 186* 172* 211* 198*  BUN 21* 21* 18 19 20   CREATININE 0.84 0.66 0.85 0.78 0.88  CALCIUM 9.1 9.5 9.1 8.9 8.9  MG  --   --  1.6*  --  1.7  PHOS  --   --   --   --  4.6    GFR: Estimated Creatinine Clearance: 151.2 mL/min (by C-G formula based on SCr of 0.88 mg/dL).  Liver Function Tests: Recent Labs  Lab 11/07/20 0313  AST 25  ALT 22  ALKPHOS 60  BILITOT 0.7  PROT 7.4  ALBUMIN 3.3*    CBG: Recent Labs  Lab 11/07/20 1639 11/07/20 2153 11/08/20 0720 11/08/20 1205 11/08/20 1643  GLUCAP 173* 159* 184* 175* 169*     No results found for this or any previous visit (from the past 240 hour(s)).       Radiology Studies: No results found.      Scheduled Meds: . acetaminophen  1,000 mg Oral QHS  . enoxaparin (LOVENOX) injection  100 mg Subcutaneous Q24H  . ferrous sulfate  325 mg Oral Daily  . hydrocortisone cream   Topical BID  . hydrOXYzine  10 mg Oral TID  . insulin aspart  0-20 Units Subcutaneous TID WC  . insulin aspart  0-5 Units Subcutaneous QHS  . insulin glargine  18 Units Subcutaneous Daily  . magnesium oxide  400 mg Oral BID  . metoprolol tartrate  12.5 mg Oral BID  . polyethylene glycol  17 g Oral BID  . potassium chloride  40 mEq Oral Daily  . senna-docusate  1 tablet Oral BID   Continuous Infusions: . sodium chloride 250 mL (10/15/20 2301)     LOS: 22 days    Time spent: over 30 min    Fayrene Helper, MD [facility]   To contact the attending provider between 7A-7P or the covering provider during after hours 7P-7A, please log into the web site [email or web address] and access using universal [place] password for that web site. If you do not have the password, please call the hospital operator.  11/08/2020, 6:21 PM

## 2020-11-08 NOTE — Progress Notes (Signed)
Physical Therapy Treatment Patient Details Name: [name] MRN: [id number] DOB: July 31, 1965 Today's Date: 11/08/2020       PT Comments    Patient making progress with therapy and pushing through LE pain with increased WB/tilt today. Patient transferred via MaxiSky chair>bed at start of session and continues to initiate rolling more each date, however still requiring +2 assist to fully bring lower trunk into sidelying. Pt was able to tilt to ~30-52* for ~18 minutes with at lest 8 minutes spent at 52*. Patient completed functional UE reaching/hand off of balled up socks alternating UE's to facilitate contralateral weight shift. Isomtric exercises for core muscle including: glutes and transverse abdominis completed in 30-45* tilt. EOS pt needing to reposition and bed trendelenburg for pt to use bil UE's on rails to pull up and scoot superiorly. Min assist provided at bed pad to prevent posterior skin sheering. She will continue to benefit from skilled PT to progress mobility. Acute PT will continue to progress pt as able.       11/08/20 1500  PT Visit Information  Last PT Received On 11/08/20  Assistance Needed +2  History of Present Illness Pt admitted from home with multiple LE wounds, and FTT and unable to perform basic self care.  Pt reports she stayed in lift chair getting up only twice a day for BM or getting food.  Pt utilizing Rollator around home but pivoted to it from lift chair and wheeled around in seated position.  Pt states she put pads and diapers in chair so she did not have to get up to urinate.  Pt with hx of DM and lymphadema (has been unable to use her pressure sleeves so lymphadema has significantly worsened since last December).  Subjective Data  Patient Stated Goal manage myself and go home  Precautions  Precautions Fall  Precaution Comments posterior LE wounds, place bed pad  to cushion when maxiskying.  Restrictions  Weight Bearing Restrictions No  Pain  Assessment  Pain Assessment Faces  Faces Pain Scale 8  Pain Location B knees,  back (with activity/movement, esp when laying more flat or rolling  Pain Descriptors / Indicators Spasm  Pain Intervention(s) Limited activity within patient's tolerance;Monitored during session;Repositioned;Premedicated before session;Heat applied  Cognition  Arousal/Alertness Awake/alert  Behavior During Therapy WFL for tasks assessed/performed  Overall Cognitive Status Within Functional Limits for tasks assessed  Bed Mobility  Overal bed mobility Needs Assistance  Bed Mobility Rolling  Rolling Max assist;+2 for physical assistance;+2 for safety/equipment  General bed mobility comments Pt continues to initaite bed mobiltiy with reaching contralaterally for rolling, Max +2 assist with use of bed pad continues to be requried to complete lower body roll. 2x to Lt side and 1x to Rt. Pt also using bil UE's on bed rails with bed in trendelenburg and min assist with bed pad to scoot/pull up and move superiorly in bed.  Transfers  Transfer via Public affairs consultant transfer comment transfer from recliner to bed via maxisky. bed pad placed at Lt knee to protect skin where pt has breakdown and wound.  Tilt Bed  Patient on Tilt Bed? Yes  Start Time 1545  Angle 52 degrees (30-52)  Total Minutes in Angle 18 minutes  Patient Response Cooperative  Other Exercises  Other Exercises 10 reps with 5 sec holds glut set in tilt (40*)  Other Exercises 10 reps with 3 sec holds TA set in tilt (40*)  Other Exercises Bil UE ball hand off with balled  up socks, pt with single UE support on bed rail while in 52* tilt pt taking ball ipsilateral side and passing/handing it to contralateral side to facilitate trunk rotation and increased contralatera weight shifts. performed 4 times with bil UE's.  PT - End of Session  Activity Tolerance Patient limited by pain  Patient left in bed;with call bell/phone within reach  Nurse  Communication Mobility status;Need for lift equipment   PT - Assessment/Plan  PT Plan Current plan remains appropriate  PT Visit Diagnosis Muscle weakness (generalized) (M62.81);Difficulty in walking, not elsewhere classified (R26.2);Pain;Adult, failure to thrive (R62.7)  Pain - Right/Left Left  Pain - part of body Knee;Leg;Hip  PT Frequency (ACUTE ONLY) Min 2X/week  Follow Up Recommendations SNF  PT equipment None recommended by PT  AM-PAC PT "6 Clicks" Mobility Outcome Measure (Version 2)  Help needed turning from your back to your side while in a flat bed without using bedrails? 1  Help needed moving from lying on your back to sitting on the side of a flat bed without using bedrails? 1  Help needed moving to and from a bed to a chair (including a wheelchair)? 1  Help needed standing up from a chair using your arms (e.g., wheelchair or bedside chair)? 1  Help needed to walk in hospital room? 1  Help needed climbing 3-5 steps with a railing?  1  6 Click Score 6  Consider Recommendation of Discharge To: CIR/SNF/LTACH  PT Goal Progression  Progress towards PT goals Progressing toward goals  Acute Rehab PT Goals  PT Goal Formulation With patient  Time For Goal Achievement 11/07/20  Potential to Achieve Goals Fair  PT Time Calculation  PT Start Time (ACUTE ONLY) 1514  PT Stop Time (ACUTE ONLY) 1617  PT Time Calculation (min) (ACUTE ONLY) 63 min  PT General Charges  $$ ACUTE PT VISIT 1 Visit  PT Treatments  $Therapeutic Exercise 23-37 mins  $Therapeutic Activity 23-37 mins     Verner Mould, DPT Acute Rehabilitation Services Office [phone] Pager [phone]

## 2020-11-09 DIAGNOSIS — L089 Local infection of the skin and subcutaneous tissue, unspecified: Secondary | ICD-10-CM | POA: Diagnosis not present

## 2020-11-09 DIAGNOSIS — T148XXA Other injury of unspecified body region, initial encounter: Secondary | ICD-10-CM | POA: Diagnosis not present

## 2020-11-09 LAB — GLUCOSE, CAPILLARY
Glucose-Capillary: 172 mg/dL — ABNORMAL HIGH (ref 70–99)
Glucose-Capillary: 159 mg/dL — ABNORMAL HIGH (ref 70–99)
Glucose-Capillary: 177 mg/dL — ABNORMAL HIGH (ref 70–99)
Glucose-Capillary: 168 mg/dL — ABNORMAL HIGH (ref 70–99)

## 2020-11-09 NOTE — Progress Notes (Signed)
PROGRESS NOTE    [name]  HYW:[id number] DOB: 06-03-1966 DOA: 10/10/2020 PCP: Fanny Bien, MD   Chief Complaint  Patient presents with  . Wound Infection   Brief Narrative:  55 year old female morbidly obese type II diabetic with chronic lymphedema chronic nonhealing ulcers on both legs and calves and left upper thigh followed by wound clinic Dr. Asa Saunas was also being followed by home health nurses 3 times Jorge Retz week which has been terminated few weeks ago by insurance her insurance company. Now her wounds have become much larger with increased drainage and pain. She is unable to change the dressings by herself due to the location of the wound and morbid obesity. She does not walk she only moves around in the house in the rolling chair 1-2 times per day. She was not able to get to the restroom so she has been urinating in the bed which has made the wounds even much worse. She lives alone. She describes her pain as 10 out of 10 and has been receiving IV narcotics. Chronic wounds and multiple areas of skin breakdown. When patient came to the ER she had very old dressings still in place which were malodorous and had bloody drainage with chronic skin changes in the periwound area. She received Ancef in the ER, admitted for further management.  She's now off antibiotics. Currently discharge is pending SNF placement. Difficult placement given weight.  Assessment & Plan:   Principal Problem:   Wound infection Active Problems:   Hyperkalemia   Chronic peripheral venous hypertension with lower extremity complication   Lymphedema   Obesity   Type 2 diabetes mellitus (HCC)   HTN (hypertension)   Cellulitis  chronic bilateral lower extremity wounds/chronic lymphedema -On presentation patient noted to have increased pain, foul-smelling discharge from wounds on admission which has since improved.  Wound 100% pink and clean. -Status post 1 day of IV vancomycin and IV  cefepime (10/10/2020>>> 10/11/2020) -Patient seen by wound care with dressing changes recommended. -Lymphedema pumps, PT following (only had pumps placed on 1 occasion per her report, will need to follow up with RN) -Continue current pain management with oxycodone, robaxin. -PT/OT.  2.  Type 2 diabetes mellitus Hemoglobin A1c 6.5 (10/10/2020) -Patient not on oral hypoglycemic agents prior to admission and per prior attending refusing to be started on metformin. -Continue current regimen of Lantus and SSI.  - adjust as needed - BG reasonable  3.  Hypertension -Blood pressure noted to be borderline and as such HCTZ discontinued.  Lasix has also been discontinued.   -Continue metoprolol.   - BP reasonable today  4.  Mildly elevated D-dimer -Patient noted to have Ellina Sivertsen mild tachycardia with heart rate in the low 100s. -improvement with heart rates. -D-dimer noted to be mildly elevated. -Lower extremity Dopplers were suboptimal study due to body habitus. -Patient noted to be 2 heavy for CT scan and as such unable to do [place] CT angiogram of chest. -Patient however low suspicion for PE as patient denies any chest pain, no significant shortness of breath, not hypoxic. -Continue DVT prophylaxis. Follow-up.  5.  Morbid obesity BMI 73. -Lifestyle modification advised.  Prior provider discussed extensively with patient on 10/22/2020 about options of weight loss including consultation for bariatric surgery.  Per prior provider patient completely refuses even consultation from bariatric surgery stating to him that she had some friends that passed away after surgery and currently not interested. -Outpatient follow-up.  6.  Hypomagnesemia/hypokalemia - replace and follow  -  HCTZ discontinued due to borderline blood pressure.   -Continue daily oral potassium supplementation.   -continue oral mag supplementation -Repeat labs in 1 to 2 days.  7. Pressure Injuries Pressure Injury 10/11/20 Thigh  Left;Posterior (Active)  10/11/20 0041  Location: Thigh  Location Orientation: Left;Posterior  Staging:   Wound Description (Comments):   Present on Admission:      Pressure Injury 10/11/20 Tibial Left;Posterior;Proximal (Active)  10/11/20 0041  Location: Tibial  Location Orientation: Left;Posterior;Proximal  Staging:   Wound Description (Comments):   Present on Admission:    #8 constipation -MiraLAX twice daily.  Senokot-S twice daily.    #8 disposition -Patient lives alone and unable to care for herself -Patient noted on presentation to be unable to change the dressings on the wound due to location, morbid obesity, pain. -Patient noted to be nonambulatory. -Patient noted to be moving around in her Rollator couple of times during the day. -Patient noted to be unable to cook for herself as she is unable to stand and subsequently requires placement. -Patient is unsafe discharge back home alone. -Patient with no family close by that could assist with her. -PT following patient and working on schedule with nursing staff for lymphedema pumps. -Out of bed to chair and multiple tilts per day. -TOC consulted for placement.  DVT prophylaxis: lovenox Code Status: full  Family Communication: none at bedside Disposition:   Status is: Inpatient  Remains inpatient appropriate because:Inpatient level of care appropriate due to severity of illness   Dispo: The patient is from: Home              Anticipated d/c is to: Home              Patient currently is not medically stable to d/c.   Difficult to place patient No       Consultants:   none  Procedures:  LE [place] Summary:  RIGHT:  - Suboptimal study due to patient body habitus. Unable to compress  visualized veins.    LEFT:  - Suboptimal study due to patient body habitus. Unable to compress  visualized veins.     *See table(s) above for measurements and observations.   Antimicrobials:  Anti-infectives (From  admission, onward)   Start     Dose/Rate Route Frequency Ordered Stop   10/11/20 1000  vancomycin (VANCOREADY) IVPB 2000 mg/400 mL  Status:  Discontinued        2,000 mg 200 mL/hr over 120 Minutes Intravenous Every 12 hours 10/10/20 2047 10/11/20 1140   10/11/20 0100  ceFEPIme (MAXIPIME) 2 g in sodium chloride 0.9 % 100 mL IVPB  Status:  Discontinued        2 g 200 mL/hr over 30 Minutes Intravenous Every 8 hours 10/10/20 2001 10/11/20 1140   10/10/20 2100  vancomycin (VANCOCIN) 2,500 mg in sodium chloride 0.9 % 500 mL IVPB        2,500 mg 250 mL/hr over 120 Minutes Intravenous  Once 10/10/20 2001 10/11/20 0135   10/10/20 1545  ceFAZolin (ANCEF) IVPB 2g/100 mL premix        2 g 200 mL/hr over 30 Minutes Intravenous  Once 10/10/20 1530 10/10/20 1723         Subjective: No new complaints Still spasms/knee pain - improved with heating pad/stretches  Objective: Vitals:   11/08/20 1350 11/08/20 1419 11/08/20 2124 11/09/20 0554  BP: (!) 117/25 107/71 118/70 (!) 141/78  Pulse: 88 92 89 99  Resp: 20  20 18   Temp: 98.7  F (37.1 C)  98.5 F (36.9 C) 98.2 F (36.8 C)  TempSrc: Oral  Oral Oral  SpO2: 95%  97% 90%  Weight:      Height:        Intake/Output Summary (Last 24 hours) at 11/09/2020 1047 Last data filed at 11/08/2020 2124 Gross per 24 hour  Intake 720 ml  Output 700 ml  Net 20 ml   Filed Weights   11/01/20 0500 11/05/20 0500  Weight: (!) 217.4 kg (!) 217.4 kg    Examination:  General: No acute distress. Cardiovascular: RRR Lungs: unlabored Abdomen: Soft, nontender, nondistended Neurological: Alert and oriented 3. Moves all extremities 4 Cranial nerves II through XII grossly intact. Skin: dressing intact to LLE Extremities: bilateral nonpitting edema   Data Reviewed: I have personally reviewed following labs and imaging studies  CBC: Recent Labs  Lab 11/03/20 0408 11/06/20 0312 11/07/20 0313  WBC 6.2 5.9 6.3  NEUTROABS 3.2 2.8 3.1  HGB 13.3 12.9  13.3  HCT 44.5 43.1 44.2  MCV 97.2 97.5 97.1  PLT 243 225 161    Basic Metabolic Panel: Recent Labs  Lab 11/03/20 0408 11/04/20 0311 11/05/20 0324 11/06/20 0312 11/07/20 0313  NA 134* 140 137 137 137  K 3.5 3.5 3.5 3.4* 3.6  CL 99 102 101 101 104  CO2 25 27 27 27 26   GLUCOSE 180* 186* 172* 211* 198*  BUN 21* 21* 18 19 20   CREATININE 0.84 0.66 0.85 0.78 0.88  CALCIUM 9.1 9.5 9.1 8.9 8.9  MG  --   --  1.6*  --  1.7  PHOS  --   --   --   --  4.6    GFR: Estimated Creatinine Clearance: 151.2 mL/min (by C-G formula based on SCr of 0.88 mg/dL).  Liver Function Tests: Recent Labs  Lab 11/07/20 0313  AST 25  ALT 22  ALKPHOS 60  BILITOT 0.7  PROT 7.4  ALBUMIN 3.3*    CBG: Recent Labs  Lab 11/08/20 0720 11/08/20 1205 11/08/20 1643 11/08/20 2132 11/09/20 0747  GLUCAP 184* 175* 169* 162* 172*     No results found for this or any previous visit (from the past 240 hour(s)).       Radiology Studies: No results found.      Scheduled Meds: . acetaminophen  1,000 mg Oral QHS  . enoxaparin (LOVENOX) injection  100 mg Subcutaneous Q24H  . ferrous sulfate  325 mg Oral Daily  . hydrocortisone cream   Topical BID  . hydrOXYzine  10 mg Oral TID  . insulin aspart  0-20 Units Subcutaneous TID WC  . insulin aspart  0-5 Units Subcutaneous QHS  . insulin glargine  18 Units Subcutaneous Daily  . magnesium oxide  400 mg Oral BID  . metoprolol tartrate  12.5 mg Oral BID  . polyethylene glycol  17 g Oral BID  . potassium chloride  40 mEq Oral Daily  . senna-docusate  1 tablet Oral BID   Continuous Infusions: . sodium chloride 250 mL (10/15/20 2301)     LOS: 23 days    Time spent: over 30 min    Fayrene Helper, MD [facility]   To contact the attending provider between 7A-7P or the covering provider during after hours 7P-7A, please log into the web site [email or web address] and access using universal [place] password for that web site. If you do not  have the password, please call the hospital operator.  11/09/2020, 10:47 AM

## 2020-11-10 DIAGNOSIS — T148XXA Other injury of unspecified body region, initial encounter: Secondary | ICD-10-CM | POA: Diagnosis not present

## 2020-11-10 DIAGNOSIS — L089 Local infection of the skin and subcutaneous tissue, unspecified: Secondary | ICD-10-CM | POA: Diagnosis not present

## 2020-11-10 LAB — CBC WITH DIFFERENTIAL/PLATELET
Abs Immature Granulocytes: 0.02 10*3/uL (ref 0.00–0.07)
Basophils Absolute: 0 10*3/uL (ref 0.0–0.1)
Basophils Relative: 1 %
Eosinophils Absolute: 0.3 10*3/uL (ref 0.0–0.5)
Eosinophils Relative: 5 %
HCT: 46.2 % — ABNORMAL HIGH (ref 36.0–46.0)
Hemoglobin: 13.6 g/dL (ref 12.0–15.0)
Immature Granulocytes: 0 %
Lymphocytes Relative: 37 %
Lymphs Abs: 2.3 10*3/uL (ref 0.7–4.0)
MCH: 29.2 pg (ref 26.0–34.0)
MCHC: 29.4 g/dL — ABNORMAL LOW (ref 30.0–36.0)
MCV: 99.1 fL (ref 80.0–100.0)
Monocytes Absolute: 0.5 10*3/uL (ref 0.1–1.0)
Monocytes Relative: 8 %
Neutro Abs: 3.1 10*3/uL (ref 1.7–7.7)
Neutrophils Relative %: 49 %
Platelets: 226 10*3/uL (ref 150–400)
RBC: 4.66 MIL/uL (ref 3.87–5.11)
RDW: 13.3 % (ref 11.5–15.5)
WBC: 6.3 10*3/uL (ref 4.0–10.5)
nRBC: 0 % (ref 0.0–0.2)

## 2020-11-10 LAB — COMPREHENSIVE METABOLIC PANEL
ALT: 23 U/L (ref 0–44)
AST: 24 U/L (ref 15–41)
Albumin: 3.3 g/dL — ABNORMAL LOW (ref 3.5–5.0)
Alkaline Phosphatase: 57 U/L (ref 38–126)
Anion gap: 7 (ref 5–15)
BUN: 18 mg/dL (ref 6–20)
CO2: 27 mmol/L (ref 22–32)
Calcium: 8.8 mg/dL — ABNORMAL LOW (ref 8.9–10.3)
Chloride: 105 mmol/L (ref 98–111)
Creatinine, Ser: 0.84 mg/dL (ref 0.44–1.00)
GFR, Estimated: >60 mL/min (ref >60)
Glucose, Bld: 181 mg/dL — ABNORMAL HIGH (ref 70–99)
Potassium: 3.6 mmol/L (ref 3.5–5.1)
Sodium: 139 mmol/L (ref 135–145)
Total Bilirubin: 0.7 mg/dL (ref 0.3–1.2)
Total Protein: 7.3 g/dL (ref 6.5–8.1)

## 2020-11-10 LAB — GLUCOSE, CAPILLARY
Glucose-Capillary: 151 mg/dL — ABNORMAL HIGH (ref 70–99)
Glucose-Capillary: 176 mg/dL — ABNORMAL HIGH (ref 70–99)
Glucose-Capillary: 176 mg/dL — ABNORMAL HIGH (ref 70–99)
Glucose-Capillary: 175 mg/dL — ABNORMAL HIGH (ref 70–99)

## 2020-11-10 LAB — MAGNESIUM: Magnesium: 1.9 mg/dL (ref 1.7–2.4)

## 2020-11-10 LAB — PHOSPHORUS: Phosphorus: 4.5 mg/dL (ref 2.5–4.6)

## 2020-11-10 MED ORDER — BISACODYL 10 MG RE SUPP: 10.0000 mg | Freq: Every day | RECTAL | Status: DC | PRN

## 2020-11-10 MED ORDER — LIDOCAINE 5 % EX PTCH
3.0000 | MEDICATED_PATCH | CUTANEOUS | Status: DC
  Administered 2020-11-10: 3 via TRANSDERMAL
  Filled 2020-11-10 (×6): qty 3

## 2020-11-10 NOTE — Plan of Care (Signed)
  Problem: Pain Managment: Goal: General experience of comfort will improve Outcome: Progressing   Problem: Safety: Goal: Ability to remain free from injury will improve Outcome: Progressing   

## 2020-11-10 NOTE — Progress Notes (Signed)
PROGRESS NOTE    [name]  MPN:[id number] DOB: 17-May-1966 DOA: 10/10/2020 PCP: Sierra Bien, MD   Chief Complaint  Patient presents with  . Wound Infection   Brief Narrative:  55 year old female morbidly obese type II diabetic with chronic lymphedema chronic nonhealing ulcers on both legs and calves and left upper thigh followed by wound clinic Dr. Asa Ware was also being followed by home health nurses 3 times [name] week which has been terminated few weeks ago by insurance her insurance company. Now her wounds have become much larger with increased drainage and pain. She is unable to change the dressings by herself due to the location of the wound and morbid obesity. She does not walk she only moves around in the house in the rolling chair 1-2 times per day. She was not able to get to the restroom so she has been urinating in the bed which has made the wounds even much worse. She lives alone. She describes her pain as 10 out of 10 and has been receiving IV narcotics. Chronic wounds and multiple areas of skin breakdown. When patient came to the ER she had very old dressings still in place which were malodorous and had bloody drainage with chronic skin changes in the periwound area. She received Ancef in the ER, admitted for further management.  She's now off antibiotics. Currently discharge is pending SNF placement. Difficult placement given weight.  Assessment & Plan:   Principal Problem:   Wound infection Active Problems:   Hyperkalemia   Chronic peripheral venous hypertension with lower extremity complication   Lymphedema   Obesity   Type 2 diabetes mellitus (HCC)   HTN (hypertension)   Cellulitis  chronic bilateral lower extremity wounds/chronic lymphedema -On presentation patient noted to have increased pain, foul-smelling discharge from wounds on admission which has since improved.  Wound 100% pink and clean. -Status post 1 day of IV vancomycin and IV  cefepime (10/10/2020>>> 10/11/2020) -Patient seen by wound care with dressing changes recommended. -Lymphedema pumps, PT following (only had pumps placed on 1 occasion per her report, will need to follow up with RN) -Continue current pain management with oxycodone, robaxin. -PT/OT.  2.  Type 2 diabetes mellitus Hemoglobin A1c 6.5 (10/10/2020) -Patient not on oral hypoglycemic agents prior to admission and per prior attending refusing to be started on metformin. -Continue current regimen of Lantus and SSI.  - adjust as needed - BG reasonable   3.  Hypertension -Blood pressure noted to be borderline and as such HCTZ discontinued.  Lasix has also been discontinued.   -Continue metoprolol.   - BP ok today  4.  Mildly elevated D-dimer -Patient noted to have [name] mild tachycardia with heart rate in the low 100s. -improvement with heart rates. -D-dimer noted to be mildly elevated. -Lower extremity Dopplers were suboptimal study due to body habitus. -Patient noted to be 2 heavy for CT scan and as such unable to do Lashaun Poch CT angiogram of chest. -Patient however low suspicion for PE as patient denies any chest pain, no significant shortness of breath, not hypoxic. -Continue DVT prophylaxis. Follow-up.  5.  Morbid obesity BMI 73. -Lifestyle modification advised.  Prior provider discussed extensively with patient on 10/22/2020 about options of weight loss including consultation for bariatric surgery.  Per prior provider patient completely refuses even consultation from bariatric surgery stating to him that she had some friends that passed away after surgery and currently not interested. -Outpatient follow-up.  6.  Hypomagnesemia/hypokalemia - replace and follow  -  HCTZ discontinued due to borderline blood pressure.   -Continue daily oral potassium supplementation.   -continue oral mag supplementation -Repeat labs in 1 to 2 days.  7. Pressure Injuries Pressure Injury 10/11/20 Thigh Left;Posterior  (Active)  10/11/20 0041  Location: Thigh  Location Orientation: Left;Posterior  Staging:   Wound Description (Comments):   Present on Admission:      Pressure Injury 10/11/20 Tibial Left;Posterior;Proximal (Active)  10/11/20 0041  Location: Tibial  Location Orientation: Left;Posterior;Proximal  Staging:   Wound Description (Comments):   Present on Admission:    #8 constipation -MiraLAX twice daily.  Senokot-S twice daily.    #8 disposition -Patient lives alone and unable to care for herself -Patient noted on presentation to be unable to change the dressings on the wound due to location, morbid obesity, pain. -Patient noted to be nonambulatory. -Patient noted to be moving around in her Rollator couple of times during the day. -Patient noted to be unable to cook for herself as she is unable to stand and subsequently requires placement. -Patient is unsafe discharge back home alone. -Patient with no family close by that could assist with her. -PT following patient and working on schedule with nursing staff for lymphedema pumps. -Out of bed to chair and multiple tilts per day. -TOC consulted for placement.  DVT prophylaxis: lovenox Code Status: full  Family Communication: none at bedside Disposition:   Status is: Inpatient  Remains inpatient appropriate because:Inpatient level of care appropriate due to severity of illness   Dispo: The patient is from: Home              Anticipated d/c is to: Home              Patient currently is not medically stable to d/c.   Difficult to place patient No       Consultants:   none  Procedures:  LE [place] Summary:  RIGHT:  - Suboptimal study due to patient body habitus. Unable to compress  visualized veins.    LEFT:  - Suboptimal study due to patient body habitus. Unable to compress  visualized veins.     *See table(s) above for measurements and observations.   Antimicrobials:  Anti-infectives (From admission, onward)    Start     Dose/Rate Route Frequency Ordered Stop   10/11/20 1000  vancomycin (VANCOREADY) IVPB 2000 mg/400 mL  Status:  Discontinued        2,000 mg 200 mL/hr over 120 Minutes Intravenous Every 12 hours 10/10/20 2047 10/11/20 1140   10/11/20 0100  ceFEPIme (MAXIPIME) 2 g in sodium chloride 0.9 % 100 mL IVPB  Status:  Discontinued        2 g 200 mL/hr over 30 Minutes Intravenous Every 8 hours 10/10/20 2001 10/11/20 1140   10/10/20 2100  vancomycin (VANCOCIN) 2,500 mg in sodium chloride 0.9 % 500 mL IVPB        2,500 mg 250 mL/hr over 120 Minutes Intravenous  Once 10/10/20 2001 10/11/20 0135   10/10/20 1545  ceFAZolin (ANCEF) IVPB 2g/100 mL premix        2 g 200 mL/hr over 30 Minutes Intravenous  Once 10/10/20 1530 10/10/20 1723         Subjective: No new complaints Mood is ok, she's coping  Objective: Vitals:   11/09/20 1400 11/09/20 2122 11/10/20 0512 11/10/20 1438  BP: 115/71 129/63 133/82 122/70  Pulse: 88 97 97 95  Resp: 16 17 17 17   Temp: 98.7 F (37.1 C) 98.5 F (  36.9 C) 98 F (36.7 C) 98.8 F (37.1 C)  TempSrc: Oral Oral Oral Oral  SpO2: 96% 92% 98% 94%  Weight:      Height:        Intake/Output Summary (Last 24 hours) at 11/10/2020 1612 Last data filed at 11/10/2020 0514 Gross per 24 hour  Intake 240 ml  Output 450 ml  Net -210 ml   Filed Weights   11/01/20 0500 11/05/20 0500  Weight: (!) 217.4 kg (!) 217.4 kg    Examination:  General: No acute distress. obese Cardiovascular: RRR Lungs: unlabored Abdomen: Soft, nontender, protuberant Neurological: Alert and oriented 3. Moves all extremities 4 . Cranial nerves II through XII grossly intact. Skin: Warm and dry. No rashes or lesions. Extremities: bilateral nonpitting edema, dressing to LLE   Data Reviewed: I have personally reviewed following labs and imaging studies  CBC: Recent Labs  Lab 11/06/20 0312 11/07/20 0313 11/10/20 0312  WBC 5.9 6.3 6.3  NEUTROABS 2.8 3.1 3.1  HGB 12.9 13.3  13.6  HCT 43.1 44.2 46.2*  MCV 97.5 97.1 99.1  PLT 225 227 250    Basic Metabolic Panel: Recent Labs  Lab 11/04/20 0311 11/05/20 0324 11/06/20 0312 11/07/20 0313 11/10/20 0312  NA 140 137 137 137 139  K 3.5 3.5 3.4* 3.6 3.6  CL 102 101 101 104 105  CO2 27 27 27 26 27   GLUCOSE 186* 172* 211* 198* 181*  BUN 21* 18 19 20 18   CREATININE 0.66 0.85 0.78 0.88 0.84  CALCIUM 9.5 9.1 8.9 8.9 8.8*  MG  --  1.6*  --  1.7 1.9  PHOS  --   --   --  4.6 4.5    GFR: Estimated Creatinine Clearance: 158.4 mL/min (by C-G formula based on SCr of 0.84 mg/dL).  Liver Function Tests: Recent Labs  Lab 11/07/20 0313 11/10/20 0312  AST 25 24  ALT 22 23  ALKPHOS 60 57  BILITOT 0.7 0.7  PROT 7.4 7.3  ALBUMIN 3.3* 3.3*    CBG: Recent Labs  Lab 11/09/20 1156 11/09/20 1704 11/09/20 2014 11/10/20 0836 11/10/20 1135  GLUCAP 159* 177* 168* 151* 176*     No results found for this or any previous visit (from the past 240 hour(s)).       Radiology Studies: No results found.      Scheduled Meds: . acetaminophen  1,000 mg Oral QHS  . enoxaparin (LOVENOX) injection  100 mg Subcutaneous Q24H  . ferrous sulfate  325 mg Oral Daily  . hydrocortisone cream   Topical BID  . hydrOXYzine  10 mg Oral TID  . insulin aspart  0-20 Units Subcutaneous TID WC  . insulin aspart  0-5 Units Subcutaneous QHS  . insulin glargine  18 Units Subcutaneous Daily  . lidocaine  3 patch Transdermal Q24H  . magnesium oxide  400 mg Oral BID  . metoprolol tartrate  12.5 mg Oral BID  . polyethylene glycol  17 g Oral BID  . potassium chloride  40 mEq Oral Daily  . senna-docusate  1 tablet Oral BID   Continuous Infusions: . sodium chloride 250 mL (10/15/20 2301)     LOS: 24 days    Time spent: over 30 min    Fayrene Helper, MD [facility]   To contact the attending provider between 7A-7P or the covering provider during after hours 7P-7A, please log into the web site [email or web address] and  access using universal [place] password for that web site. If you  do not have the password, please call the hospital operator.  11/10/2020, 4:12 PM

## 2020-11-10 NOTE — Plan of Care (Signed)
  Problem: Education: Goal: Knowledge of General Education information will improve Description: Including pain rating scale, medication(s)/side effects and non-pharmacologic comfort measures Outcome: Progressing   Problem: Nutrition: Goal: Adequate nutrition will be maintained Outcome: Progressing   Problem: Coping: Goal: Level of anxiety will decrease Outcome: Progressing   Problem: Elimination: Goal: Will not experience complications related to urinary retention Outcome: Progressing   Problem: Pain Managment: Goal: General experience of comfort will improve Outcome: Progressing   

## 2020-11-11 DIAGNOSIS — L089 Local infection of the skin and subcutaneous tissue, unspecified: Secondary | ICD-10-CM | POA: Diagnosis not present

## 2020-11-11 DIAGNOSIS — T148XXA Other injury of unspecified body region, initial encounter: Secondary | ICD-10-CM | POA: Diagnosis not present

## 2020-11-11 LAB — GLUCOSE, CAPILLARY
Glucose-Capillary: 170 mg/dL — ABNORMAL HIGH (ref 70–99)
Glucose-Capillary: 203 mg/dL — ABNORMAL HIGH (ref 70–99)
Glucose-Capillary: 140 mg/dL — ABNORMAL HIGH (ref 70–99)
Glucose-Capillary: 176 mg/dL — ABNORMAL HIGH (ref 70–99)

## 2020-11-11 NOTE — Plan of Care (Signed)
  Problem: Nutrition: Goal: Adequate nutrition will be maintained Outcome: Progressing   Problem: Coping: Goal: Level of anxiety will decrease Outcome: Progressing   Problem: Pain Managment: Goal: General experience of comfort will improve Outcome: Progressing   Problem: Safety: Goal: Ability to remain free from injury will improve Outcome: Progressing   

## 2020-11-11 NOTE — Progress Notes (Signed)
Physical Therapy Treatment Patient Details Name: [name] MRN: [id number] DOB: 11-Dec-1965 Today's Date: 11/11/2020    History of Present Illness Pt admitted from home with multiple LE wounds, and FTT and unable to perform basic self care.  Pt reports she stayed in lift chair getting up only twice a day for BM or getting food.  Pt utilizing Rollator around home but pivoted to it from lift chair and wheeled around in seated position.  Pt states she put pads and diapers in chair so she did not have to get up to urinate.  Pt with hx of DM and lymphadema (has been unable to use her pressure sleeves so lymphadema has significantly worsened since last December).    PT Comments    The patient reports  That the rainy weather is affecting her knee joints. Patient tolerated  A total of 16 minutes tiled from 35* to 40, then back to 35 then  To 43* for 2 minutes. Patient  Reports that PTA she would spend less than 1 minute in a standing position to get from recliner to rollator.  Continue efforts for standing as patient tolerates.  Follow Up Recommendations  SNF     Equipment Recommendations  None recommended by PT    Recommendations for Other Services       Precautions / Restrictions Precautions Precautions: Fall Precaution Comments: posterior LE wounds, place bed pad  to cushion when maxiskying.    Mobility  Bed Mobility Overal bed mobility: Needs Assistance Bed Mobility: Rolling Rolling: Max assist;+2 for physical assistance;+2 for safety/equipment         General bed mobility comments: Pt continues to initaite bed mobiltiy with reaching contralaterally for rolling, Max +2 assist with use of bed pad continues to be requried to complete lower body roll. 2x to Lt side and 1x to Rt. Pt also using bil UE's on bed rails with bed in trendelenburg and min assist with bed pad to scoot/pull up and move superiorly in bed. Start Time: 1450 Angle: 43 degrees Total Minutes in Angle: 3  minutes  Transfers                 General transfer comment: transfer from recliner to bed via maxisky. bed pad placed at Lt knee to protect skin where pt has breakdown and wound.  Ambulation/Gait                 Stairs             Wheelchair Mobility    Modified Rankin (Stroke Patients Only)       Balance                                            Cognition Arousal/Alertness: Awake/alert Behavior During Therapy: Anxious                                          Exercises      General Comments General comments (skin integrity, edema, etc.): unable      Pertinent Vitals/Pain Faces Pain Scale: Hurts whole lot Pain Location: B knees,  back (with activity/movement, esp when laying more flat or rolling, states the rainy weather has Pain Descriptors / Indicators: Spasm;Cramping;Crying Pain Intervention(s): Limited activity within patient's tolerance;Monitored during session;Patient  requesting pain meds-RN notified;Heat applied    Home Living                      Prior Function            PT Goals (current goals can now be found in the care plan section) Acute Rehab PT Goals Time For Goal Achievement: 11/25/20 Additional Goals Additional Goal #1: tolerate tilt standing bed x 20 mins at 50 degrees.    Frequency    Min 2X/week      PT Plan Current plan remains appropriate    Co-evaluation              AM-PAC PT "6 Clicks" Mobility   Outcome Measure  Help needed turning from your back to your side while in a flat bed without using bedrails?: Total Help needed moving from lying on your back to sitting on the side of a flat bed without using bedrails?: Total Help needed moving to and from a bed to a chair (including a wheelchair)?: Total Help needed standing up from a chair using your arms (e.g., wheelchair or bedside chair)?: Total Help needed to walk in hospital room?: Total Help needed  climbing 3-5 steps with a railing? : Total 6 Click Score: 6    End of Session   Activity Tolerance: Patient limited by pain Patient left: in bed;with call bell/phone within reach Nurse Communication: Mobility status;Need for lift equipment PT Visit Diagnosis: Muscle weakness (generalized) (M62.81);Difficulty in walking, not elsewhere classified (R26.2);Pain;Adult, failure to thrive (R62.7) Pain - Right/Left: Right Pain - part of body: Knee;Leg;Hip     Time: 1350-1505 PT Time Calculation (min) (ACUTE ONLY): 75 min  Charges:  $Therapeutic Activity: 68-82 mins                     [name] PT Acute Rehabilitation Services Pager [phone] Office [phone]    [name] 11/11/2020, 4:35 PM

## 2020-11-11 NOTE — Progress Notes (Signed)
PROGRESS NOTE    [name]  TDD:[id number] DOB: [birth year] DOA: 10/10/2020 PCP: Sierra Bien, MD   Chief Complaint  Patient presents with  . Wound Infection   Brief Narrative:  55 year old female morbidly obese type II diabetic with chronic lymphedema chronic nonhealing ulcers on both legs and calves and left upper thigh followed by wound clinic Dr. Asa Saunas was also being followed by home health nurses 3 times [name] week which has been terminated few weeks ago by insurance her insurance company. Now her wounds have become much larger with increased drainage and pain. She is unable to change the dressings by herself due to the location of the wound and morbid obesity. She does not walk she only moves around in the house in the rolling chair 1-2 times per day. She was not able to get to the restroom so she has been urinating in the bed which has made the wounds even much worse. She lives alone. She describes her pain as 10 out of 10 and has been receiving IV narcotics. Chronic wounds and multiple areas of skin breakdown. When patient came to the ER she had very old dressings still in place which were malodorous and had bloody drainage with chronic skin changes in the periwound area. She received Ancef in the ER, admitted for further management.  She's now off antibiotics. Currently discharge is pending SNF placement. Difficult placement given weight.  Assessment & Plan:   Principal Problem:   Wound infection Active Problems:   Hyperkalemia   Chronic peripheral venous hypertension with lower extremity complication   Lymphedema   Obesity   Type 2 diabetes mellitus (HCC)   HTN (hypertension)   Cellulitis  chronic bilateral lower extremity wounds/chronic lymphedema -On presentation patient noted to have increased pain, foul-smelling discharge from wounds on admission which has since improved.  Wound 100% pink and clean. -Status post 1 day of IV vancomycin and IV  cefepime (10/10/2020>>> 10/11/2020) -Patient seen by wound care with dressing changes recommended. -Lymphedema pumps, PT following (only had pumps placed on 1 occasion per her report, will need to follow up with RN) -Continue current pain management with oxycodone, robaxin. -PT/OT.  2.  Type 2 diabetes mellitus Hemoglobin A1c 6.5 (10/10/2020) -Patient not on oral hypoglycemic agents prior to admission and per prior attending refusing to be started on metformin. -Continue current regimen of Lantus and SSI.  - adjust as needed - BG stable, lunchtime slightly elevated follow  3.  Hypertension -Blood pressure noted to be borderline and as such HCTZ discontinued.  Lasix has also been discontinued.   -Continue metoprolol.   - BP stable, continue current regimen  4.  Mildly elevated D-dimer -Patient noted to have [name] mild tachycardia with heart rate in the low 100s. -improvement with heart rates. -D-dimer noted to be mildly elevated. -Lower extremity Dopplers were suboptimal study due to body habitus. -Patient noted to be 2 heavy for CT scan and as such unable to do Kaylynn Chamblin CT angiogram of chest. -Patient however low suspicion for PE as patient denies any chest pain, no significant shortness of breath, not hypoxic. -Continue DVT prophylaxis. Follow-up.  5.  Morbid obesity BMI 73. -Lifestyle modification advised.  Prior provider discussed extensively with patient on 10/22/2020 about options of weight loss including consultation for bariatric surgery.  Per prior provider patient completely refuses even consultation from bariatric surgery stating to him that she had some friends that passed away after surgery and currently not interested. -Outpatient follow-up.  6.  Hypomagnesemia/hypokalemia - replace and follow  -HCTZ discontinued due to borderline blood pressure.   -Continue daily oral potassium supplementation.   -continue oral mag supplementation -Repeat labs in 1 to 2 days.  7. Pressure  Injuries Pressure Injury 10/11/20 Thigh Left;Posterior (Active)  10/11/20 0041  Location: Thigh  Location Orientation: Left;Posterior  Staging:   Wound Description (Comments):   Present on Admission:      Pressure Injury 10/11/20 Tibial Left;Posterior;Proximal (Active)  10/11/20 0041  Location: Tibial  Location Orientation: Left;Posterior;Proximal  Staging:   Wound Description (Comments):   Present on Admission:    #8 constipation -MiraLAX twice daily.  Senokot-S twice daily.    #8 disposition -Patient lives alone and unable to care for herself -Patient noted on presentation to be unable to change the dressings on the wound due to location, morbid obesity, pain. -Patient noted to be nonambulatory. -Patient noted to be moving around in her Rollator couple of times during the day. -Patient noted to be unable to cook for herself as she is unable to stand and subsequently requires placement. -Patient is unsafe discharge back home alone. -Patient with no family close by that could assist with her. -PT following patient and working on schedule with nursing staff for lymphedema pumps. -Out of bed to chair and multiple tilts per day. -TOC consulted for placement.  DVT prophylaxis: lovenox Code Status: full  Family Communication: none at bedside Disposition:   Status is: Inpatient  Remains inpatient appropriate because:Inpatient level of care appropriate due to severity of illness   Dispo: The patient is from: Home              Anticipated d/c is to: Home              Patient currently is not medically stable to d/c.   Difficult to place patient No       Consultants:   none  Procedures:  LE [place] Summary:  RIGHT:  - Suboptimal study due to patient body habitus. Unable to compress  visualized veins.    LEFT:  - Suboptimal study due to patient body habitus. Unable to compress  visualized veins.     *See table(s) above for measurements and observations.    Antimicrobials:  Anti-infectives (From admission, onward)   Start     Dose/Rate Route Frequency Ordered Stop   10/11/20 1000  vancomycin (VANCOREADY) IVPB 2000 mg/400 mL  Status:  Discontinued        2,000 mg 200 mL/hr over 120 Minutes Intravenous Every 12 hours 10/10/20 2047 10/11/20 1140   10/11/20 0100  ceFEPIme (MAXIPIME) 2 g in sodium chloride 0.9 % 100 mL IVPB  Status:  Discontinued        2 g 200 mL/hr over 30 Minutes Intravenous Every 8 hours 10/10/20 2001 10/11/20 1140   10/10/20 2100  vancomycin (VANCOCIN) 2,500 mg in sodium chloride 0.9 % 500 mL IVPB        2,500 mg 250 mL/hr over 120 Minutes Intravenous  Once 10/10/20 2001 10/11/20 0135   10/10/20 1545  ceFAZolin (ANCEF) IVPB 2g/100 mL premix        2 g 200 mL/hr over 30 Minutes Intravenous  Once 10/10/20 1530 10/10/20 1723         Subjective: Stable, no complaints  Objective: Vitals:   11/10/20 1438 11/10/20 2133 11/11/20 0513 11/11/20 1304  BP: 122/70 114/69 (!) 143/79 129/72  Pulse: 95 91 97 88  Resp: 17 16 16 18   Temp: 98.8 F (37.1 C)  98.5 F (36.9 C) 98.1 F (36.7 C) 98.3 F (36.8 C)  TempSrc: Oral Oral Oral Oral  SpO2: 94% 93% 92% 95%  Weight:      Height:        Intake/Output Summary (Last 24 hours) at 11/11/2020 1342 Last data filed at 11/11/2020 1030 Gross per 24 hour  Intake 840 ml  Output 400 ml  Net 440 ml   Filed Weights   11/01/20 0500 11/05/20 0500  Weight: (!) 217.4 kg (!) 217.4 kg    Examination:  General: No acute distress. Cardiovascular:RRR Lungs: unlabored Abdomen: protuberant  Neurological: Alert and oriented 3. Moves all extremities 4. Cranial nerves II through XII grossly intact. Skin: Warm and dry. No rashes or lesions. Extremities: nonpitting edema   Data Reviewed: I have personally reviewed following labs and imaging studies  CBC: Recent Labs  Lab 11/06/20 0312 11/07/20 0313 11/10/20 0312  WBC 5.9 6.3 6.3  NEUTROABS 2.8 3.1 3.1  HGB 12.9 13.3 13.6   HCT 43.1 44.2 46.2*  MCV 97.5 97.1 99.1  PLT 225 227 258    Basic Metabolic Panel: Recent Labs  Lab 11/05/20 0324 11/06/20 0312 11/07/20 0313 11/10/20 0312  NA 137 137 137 139  K 3.5 3.4* 3.6 3.6  CL 101 101 104 105  CO2 27 27 26 27   GLUCOSE 172* 211* 198* 181*  BUN 18 19 20 18   CREATININE 0.85 0.78 0.88 0.84  CALCIUM 9.1 8.9 8.9 8.8*  MG 1.6*  --  1.7 1.9  PHOS  --   --  4.6 4.5    GFR: Estimated Creatinine Clearance: 158.4 mL/min (by C-G formula based on SCr of 0.84 mg/dL).  Liver Function Tests: Recent Labs  Lab 11/07/20 0313 11/10/20 0312  AST 25 24  ALT 22 23  ALKPHOS 60 57  BILITOT 0.7 0.7  PROT 7.4 7.3  ALBUMIN 3.3* 3.3*    CBG: Recent Labs  Lab 11/10/20 1135 11/10/20 1701 11/10/20 2116 11/11/20 0719 11/11/20 1147  GLUCAP 176* 176* 175* 170* 203*     No results found for this or any previous visit (from the past 240 hour(s)).       Radiology Studies: No results found.      Scheduled Meds: . acetaminophen  1,000 mg Oral QHS  . enoxaparin (LOVENOX) injection  100 mg Subcutaneous Q24H  . ferrous sulfate  325 mg Oral Daily  . hydrocortisone cream   Topical BID  . hydrOXYzine  10 mg Oral TID  . insulin aspart  0-20 Units Subcutaneous TID WC  . insulin aspart  0-5 Units Subcutaneous QHS  . insulin glargine  18 Units Subcutaneous Daily  . lidocaine  3 patch Transdermal Q24H  . magnesium oxide  400 mg Oral BID  . metoprolol tartrate  12.5 mg Oral BID  . polyethylene glycol  17 g Oral BID  . potassium chloride  40 mEq Oral Daily   Continuous Infusions: . sodium chloride 250 mL (10/15/20 2301)     LOS: 25 days    Time spent: over 30 min    Fayrene Helper, MD [facility]   To contact the attending provider between 7A-7P or the covering provider during after hours 7P-7A, please log into the web site [email or web address] and access using universal [place] password for that web site. If you do not have the password, please  call the hospital operator.  11/11/2020, 1:42 PM

## 2020-11-12 DIAGNOSIS — T148XXA Other injury of unspecified body region, initial encounter: Secondary | ICD-10-CM | POA: Diagnosis not present

## 2020-11-12 DIAGNOSIS — L089 Local infection of the skin and subcutaneous tissue, unspecified: Secondary | ICD-10-CM | POA: Diagnosis not present

## 2020-11-12 LAB — COMPREHENSIVE METABOLIC PANEL
ALT: 20 U/L (ref 0–44)
AST: 19 U/L (ref 15–41)
Albumin: 3.3 g/dL — ABNORMAL LOW (ref 3.5–5.0)
Alkaline Phosphatase: 56 U/L (ref 38–126)
Anion gap: 9 (ref 5–15)
BUN: 17 mg/dL (ref 6–20)
CO2: 26 mmol/L (ref 22–32)
Calcium: 9.1 mg/dL (ref 8.9–10.3)
Chloride: 103 mmol/L (ref 98–111)
Creatinine, Ser: 0.78 mg/dL (ref 0.44–1.00)
GFR, Estimated: >60 mL/min (ref >60)
Glucose, Bld: 159 mg/dL — ABNORMAL HIGH (ref 70–99)
Potassium: 4 mmol/L (ref 3.5–5.1)
Sodium: 138 mmol/L (ref 135–145)
Total Bilirubin: 0.4 mg/dL (ref 0.3–1.2)
Total Protein: 7 g/dL (ref 6.5–8.1)

## 2020-11-12 LAB — CBC WITH DIFFERENTIAL/PLATELET
Abs Immature Granulocytes: 0.01 10*3/uL (ref 0.00–0.07)
Basophils Absolute: 0 10*3/uL (ref 0.0–0.1)
Basophils Relative: 0 %
Eosinophils Absolute: 0.3 10*3/uL (ref 0.0–0.5)
Eosinophils Relative: 4 %
HCT: 44 % (ref 36.0–46.0)
Hemoglobin: 12.9 g/dL (ref 12.0–15.0)
Immature Granulocytes: 0 %
Lymphocytes Relative: 36 %
Lymphs Abs: 2.2 10*3/uL (ref 0.7–4.0)
MCH: 28.7 pg (ref 26.0–34.0)
MCHC: 29.3 g/dL — ABNORMAL LOW (ref 30.0–36.0)
MCV: 97.8 fL (ref 80.0–100.0)
Monocytes Absolute: 0.5 10*3/uL (ref 0.1–1.0)
Monocytes Relative: 7 %
Neutro Abs: 3.3 10*3/uL (ref 1.7–7.7)
Neutrophils Relative %: 53 %
Platelets: 223 10*3/uL (ref 150–400)
RBC: 4.5 MIL/uL (ref 3.87–5.11)
RDW: 13.6 % (ref 11.5–15.5)
WBC: 6.2 10*3/uL (ref 4.0–10.5)
nRBC: 0 % (ref 0.0–0.2)

## 2020-11-12 LAB — GLUCOSE, CAPILLARY
Glucose-Capillary: 177 mg/dL — ABNORMAL HIGH (ref 70–99)
Glucose-Capillary: 192 mg/dL — ABNORMAL HIGH (ref 70–99)
Glucose-Capillary: 170 mg/dL — ABNORMAL HIGH (ref 70–99)
Glucose-Capillary: 171 mg/dL — ABNORMAL HIGH (ref 70–99)

## 2020-11-12 LAB — MAGNESIUM: Magnesium: 2 mg/dL (ref 1.7–2.4)

## 2020-11-12 LAB — PHOSPHORUS: Phosphorus: 4.7 mg/dL — ABNORMAL HIGH (ref 2.5–4.6)

## 2020-11-12 NOTE — Progress Notes (Signed)
Physical Therapy Treatment Patient Details Name: [name] MRN: [id number] DOB: [birth year] Today's Date: 11/12/2020    History of Present Illness Pt admitted from home with multiple LE wounds, and FTT and unable to perform basic self care.  Pt reports she stayed in lift chair getting up only twice a day for BM or getting food.  Pt utilizing Rollator around home but pivoted to it from lift chair and wheeled around in seated position.  Pt states she put pads and diapers in chair so she did not have to get up to urinate.  Pt with hx of DM and lymphadema (has been unable to use her pressure sleeves so lymphadema has significantly worsened since last December).    PT Comments    Patient received in recliner. Leg rests lowered slowly to get feet to floor. PLaced upright at the chair  back. Patient worked on pulling forward, pulling on anchored  WIDE BSC. Patient performed x 3, Holding self for ~ 1 minute each.  Lifted  Patient back to bed via maxisky. Chair position feature utilized to sit patient upright with legs dependent and supported. Patient able to lean forward enough for lift pad to be pulled out. Patient performed multiple times , using bottom rails to work towards leaning forward. Patient felt comfortable being able to sit more chair position. Continue  Mobility attempts, reaching/leaning forward, knee flexion.Will tilt to stand as tolerated using bed feature in another treatment session.  Follow Up Recommendations  SNF     Equipment Recommendations  None recommended by PT    Recommendations for Other Services       Precautions / Restrictions Precautions Precautions: Fall Precaution Comments: posterior LE wounds, place bed pad  to cushion when maxiskying.    Mobility  Bed Mobility Overal bed mobility: Needs Assistance             General bed mobility comments: patient placed in chair position of bed. Patient able to lean forward just far enough to pull lift pad out  from behind her without rolling.    Transfers                 General transfer comment: transfer from recliner to bed via maxisky. bed pad placed at Lt knee to protect skin where pt has breakdown and wound.  Ambulation/Gait                 Stairs             Wheelchair Mobility    Modified Rankin (Stroke Patients Only)       Balance                                            Cognition Arousal/Alertness: Awake/alert Behavior During Therapy: Anxious Overall Cognitive Status: Within Functional Limits for tasks assessed                                        Exercises      General Comments General comments (skin integrity, edema, etc.): unable to stay self supported in upright posture when recliner is seated upright nor in bed chair position but able to pull forward onrails to work toward forward bending, [place] habitus does impede      Pertinent Vitals/Pain Faces Pain  Scale: Hurts whole lot Pain Location: B knees,  back (with activity/movement, esp when laying more flat or rolling,    Home Living                      Prior Function            PT Goals (current goals can now be found in the care plan section) Progress towards PT goals: Progressing toward goals    Frequency    Min 2X/week      PT Plan Current plan remains appropriate    Co-evaluation              AM-PAC PT "6 Clicks" Mobility   Outcome Measure  Help needed turning from your back to your side while in a flat bed without using bedrails?: Total Help needed moving from lying on your back to sitting on the side of a flat bed without using bedrails?: Total Help needed moving to and from a bed to a chair (including a wheelchair)?: Total Help needed standing up from a chair using your arms (e.g., wheelchair or bedside chair)?: Total Help needed to walk in hospital room?: Total Help needed climbing 3-5 steps with a railing? :  Total 6 Click Score: 6    End of Session   Activity Tolerance: Patient tolerated treatment well Patient left: in bed;with call bell/phone within reach Nurse Communication: Mobility status;Need for lift equipment PT Visit Diagnosis: Muscle weakness (generalized) (M62.81);Difficulty in walking, not elsewhere classified (R26.2);Pain;Adult, failure to thrive (R62.7) Pain - Right/Left: Right Pain - part of body: Knee;Leg;Hip     Time: 4163-8453 PT Time Calculation (min) (ACUTE ONLY): 73 min  Charges:  $Therapeutic Exercise: 23-37 mins $Therapeutic Activity: 23-37 mins $Self Care/Home Management: [place] Pager [phone] Office [phone]    [name] 11/12/2020, 4:55 PM

## 2020-11-12 NOTE — Progress Notes (Signed)
PROGRESS NOTE    [name]  YDX:[id number] DOB: Aug 16, 1965 DOA: 10/10/2020 PCP: Sierra Bien, MD   Chief Complaint  Patient presents with  . Wound Infection   Brief Narrative:  55 year old female morbidly obese type II diabetic with chronic lymphedema chronic nonhealing ulcers on both legs and calves and left upper thigh followed by wound clinic Dr. Asa Saunas was also being followed by home health nurses 3 times [name] week which has been terminated few weeks ago by insurance her insurance company. Now her wounds have become much larger with increased drainage and pain. She is unable to change the dressings by herself due to the location of the wound and morbid obesity. She does not walk she only moves around in the house in the rolling chair 1-2 times per day. She was not able to get to the restroom so she has been urinating in the bed which has made the wounds even much worse. She lives alone. She describes her pain as 10 out of 10 and has been receiving IV narcotics. Chronic wounds and multiple areas of skin breakdown. When patient came to the ER she had very old dressings still in place which were malodorous and had bloody drainage with chronic skin changes in the periwound area. She received Ancef in the ER, admitted for further management.  She's now off antibiotics. Currently discharge is pending SNF placement. Difficult placement given weight.  Assessment & Plan:   Principal Problem:   Wound infection Active Problems:   Hyperkalemia   Chronic peripheral venous hypertension with lower extremity complication   Lymphedema   Obesity   Type 2 diabetes mellitus (HCC)   HTN (hypertension)   Cellulitis  chronic bilateral lower extremity wounds/chronic lymphedema -On presentation patient noted to have increased pain, foul-smelling discharge from wounds on admission which has since improved.  Wound 100% pink and clean. -Status post 1 day of IV vancomycin and IV  cefepime (10/10/2020>>> 10/11/2020) -Patient seen by wound care with dressing changes recommended. -Lymphedema pumps, PT following (only had pumps placed on 1 occasion per her report, will need to follow up with RN) -Continue current pain management with oxycodone, robaxin. -PT/OT.  2.  Type 2 diabetes mellitus Hemoglobin A1c 6.5 (10/10/2020) -Patient not on oral hypoglycemic agents prior to admission and per prior attending refusing to be started on metformin. -Continue current regimen of Lantus and SSI.  - adjust as needed - BG stable, follow on current regimen  3.  Hypertension -Blood pressure noted to be borderline and as such HCTZ discontinued.  Lasix has also been discontinued.   -Continue metoprolol.   - BP stable, continue current regimen  4.  Mildly elevated D-dimer -Patient noted to have [name] mild tachycardia with heart rate in the low 100s. -improvement with heart rates. -D-dimer noted to be mildly elevated. -Lower extremity Dopplers were suboptimal study due to body habitus. -Patient noted to be 2 heavy for CT scan and as such unable to do [name] CT angiogram of chest. -Patient however low suspicion for PE as patient denies any chest pain, no significant shortness of breath, not hypoxic. -Continue DVT prophylaxis. Follow-up.  5.  Morbid obesity BMI 73. -Lifestyle modification advised.  Prior provider discussed extensively with patient on 10/22/2020 about options of weight loss including consultation for bariatric surgery.  Per prior provider patient completely refuses even consultation from bariatric surgery stating to him that she had some friends that passed away after surgery and currently not interested. -Outpatient follow-up.  6.  Hypomagnesemia/hypokalemia - replace and follow  -HCTZ discontinued due to borderline blood pressure.   -Continue daily oral potassium supplementation.   -continue oral mag supplementation -Repeat labs in 1 to 2 days.  7. Pressure  Injuries Pressure Injury 10/11/20 Thigh Left;Posterior (Active)  10/11/20 0041  Location: Thigh  Location Orientation: Left;Posterior  Staging:   Wound Description (Comments):   Present on Admission:      Pressure Injury 10/11/20 Tibial Left;Posterior;Proximal (Active)  10/11/20 0041  Location: Tibial  Location Orientation: Left;Posterior;Proximal  Staging:   Wound Description (Comments):   Present on Admission:    #8 constipation -MiraLAX twice daily.  Senokot-S twice daily.    #8 disposition -Patient lives alone and unable to care for herself -Patient noted on presentation to be unable to change the dressings on the wound due to location, morbid obesity, pain. -Patient noted to be nonambulatory. -Patient noted to be moving around in her Rollator couple of times during the day. -Patient noted to be unable to cook for herself as she is unable to stand and subsequently requires placement. -Patient is unsafe discharge back home alone. -Patient with no family close by that could assist with her. -PT following patient and working on schedule with nursing staff for lymphedema pumps. -Out of bed to chair and multiple tilts per day. -TOC consulted for placement.  DVT prophylaxis: lovenox Code Status: full  Family Communication: none at bedside Disposition:   Status is: Inpatient  Remains inpatient appropriate because:Inpatient level of care appropriate due to severity of illness   Dispo: The patient is from: Home              Anticipated d/c is to: Home              Patient currently is not medically stable to d/c.   Difficult to place patient No       Consultants:   none  Procedures:  LE [place] Summary:  RIGHT:  - Suboptimal study due to patient body habitus. Unable to compress  visualized veins.    LEFT:  - Suboptimal study due to patient body habitus. Unable to compress  visualized veins.     *See table(s) above for measurements and observations.    Antimicrobials:  Anti-infectives (From admission, onward)   Start     Dose/Rate Route Frequency Ordered Stop   10/11/20 1000  vancomycin (VANCOREADY) IVPB 2000 mg/400 mL  Status:  Discontinued        2,000 mg 200 mL/hr over 120 Minutes Intravenous Every 12 hours 10/10/20 2047 10/11/20 1140   10/11/20 0100  ceFEPIme (MAXIPIME) 2 g in sodium chloride 0.9 % 100 mL IVPB  Status:  Discontinued        2 g 200 mL/hr over 30 Minutes Intravenous Every 8 hours 10/10/20 2001 10/11/20 1140   10/10/20 2100  vancomycin (VANCOCIN) 2,500 mg in sodium chloride 0.9 % 500 mL IVPB        2,500 mg 250 mL/hr over 120 Minutes Intravenous  Once 10/10/20 2001 10/11/20 0135   10/10/20 1545  ceFAZolin (ANCEF) IVPB 2g/100 mL premix        2 g 200 mL/hr over 30 Minutes Intravenous  Once 10/10/20 1530 10/10/20 1723         Subjective: No new complaints  Objective: Vitals:   11/11/20 2254 11/12/20 0500 11/12/20 0510 11/12/20 1307  BP: 121/70  130/75 113/75  Pulse: 78  93 87  Resp: 16  16 18   Temp: 98.7 F (37.1 C)  97.7 F (36.5 C)   TempSrc: Oral  Oral   SpO2: 95%  92% 96%  Weight:  (!) 216.5 kg    Height:        Intake/Output Summary (Last 24 hours) at 11/12/2020 1449 Last data filed at 11/12/2020 0509 Gross per 24 hour  Intake 240 ml  Output 250 ml  Net -10 ml   Filed Weights   11/01/20 0500 11/05/20 0500 11/12/20 0500  Weight: (!) 217.4 kg (!) 217.4 kg (!) 216.5 kg    Examination:  General: No acute distress. Cardiovascular: Heart sounds show Almina Schul regular rate, and rhythm.  Lungs: Clear to auscultation bilaterally  Abdomen: Soft, nontender, nondistended Neurological: Alert and oriented 3. Moves all extremities 4 with equal strength. Cranial nerves II through XII grossly intact. Skin: sacrum not examined Extremities: bilateral nonpitting edema   Data Reviewed: I have personally reviewed following labs and imaging studies  CBC: Recent Labs  Lab 11/06/20 0312 11/07/20 0313  11/10/20 0312 11/12/20 0304  WBC 5.9 6.3 6.3 6.2  NEUTROABS 2.8 3.1 3.1 3.3  HGB 12.9 13.3 13.6 12.9  HCT 43.1 44.2 46.2* 44.0  MCV 97.5 97.1 99.1 97.8  PLT 225 227 226 545    Basic Metabolic Panel: Recent Labs  Lab 11/06/20 0312 11/07/20 0313 11/10/20 0312 11/12/20 0304  NA 137 137 139 138  K 3.4* 3.6 3.6 4.0  CL 101 104 105 103  CO2 27 26 27 26   GLUCOSE 211* 198* 181* 159*  BUN 19 20 18 17   CREATININE 0.78 0.88 0.84 0.78  CALCIUM 8.9 8.9 8.8* 9.1  MG  --  1.7 1.9 2.0  PHOS  --  4.6 4.5 4.7*    GFR: Estimated Creatinine Clearance: 165.8 mL/min (by C-G formula based on SCr of 0.78 mg/dL).  Liver Function Tests: Recent Labs  Lab 11/07/20 0313 11/10/20 0312 11/12/20 0304  AST 25 24 19   ALT 22 23 20   ALKPHOS 60 57 56  BILITOT 0.7 0.7 0.4  PROT 7.4 7.3 7.0  ALBUMIN 3.3* 3.3* 3.3*    CBG: Recent Labs  Lab 11/11/20 1147 11/11/20 1709 11/11/20 2252 11/12/20 0718 11/12/20 1127  GLUCAP 203* 140* 176* 177* 192*     No results found for this or any previous visit (from the past 240 hour(s)).       Radiology Studies: No results found.      Scheduled Meds: . acetaminophen  1,000 mg Oral QHS  . enoxaparin (LOVENOX) injection  100 mg Subcutaneous Q24H  . ferrous sulfate  325 mg Oral Daily  . hydrocortisone cream   Topical BID  . hydrOXYzine  10 mg Oral TID  . insulin aspart  0-20 Units Subcutaneous TID WC  . insulin aspart  0-5 Units Subcutaneous QHS  . insulin glargine  18 Units Subcutaneous Daily  . lidocaine  3 patch Transdermal Q24H  . magnesium oxide  400 mg Oral BID  . metoprolol tartrate  12.5 mg Oral BID  . polyethylene glycol  17 g Oral BID  . potassium chloride  40 mEq Oral Daily   Continuous Infusions: . sodium chloride 250 mL (10/15/20 2301)     LOS: 26 days    Time spent: over 30 min    Fayrene Helper, MD [facility]   To contact the attending provider between 7A-7P or the covering provider during after hours  7P-7A, please log into the web site [email or web address] and access using universal [place] password for that web site. If you do not  have the password, please call the hospital operator.  11/12/2020, 2:49 PM

## 2020-11-13 DIAGNOSIS — Z6841 Body Mass Index (BMI) 40.0 and over, adult: Secondary | ICD-10-CM

## 2020-11-13 DIAGNOSIS — E1169 Type 2 diabetes mellitus with other specified complication: Secondary | ICD-10-CM | POA: Diagnosis not present

## 2020-11-13 LAB — COMPREHENSIVE METABOLIC PANEL
ALT: 22 U/L (ref 0–44)
AST: 23 U/L (ref 15–41)
Albumin: 3.5 g/dL (ref 3.5–5.0)
Alkaline Phosphatase: 61 U/L (ref 38–126)
Anion gap: 9 (ref 5–15)
BUN: 18 mg/dL (ref 6–20)
CO2: 25 mmol/L (ref 22–32)
Calcium: 9.1 mg/dL (ref 8.9–10.3)
Chloride: 104 mmol/L (ref 98–111)
Creatinine, Ser: 0.83 mg/dL (ref 0.44–1.00)
GFR, Estimated: >60 mL/min (ref >60)
Glucose, Bld: 184 mg/dL — ABNORMAL HIGH (ref 70–99)
Potassium: 4.2 mmol/L (ref 3.5–5.1)
Sodium: 138 mmol/L (ref 135–145)
Total Bilirubin: 0.7 mg/dL (ref 0.3–1.2)
Total Protein: 7.3 g/dL (ref 6.5–8.1)

## 2020-11-13 LAB — CBC WITH DIFFERENTIAL/PLATELET
Abs Immature Granulocytes: 0.02 10*3/uL (ref 0.00–0.07)
Basophils Absolute: 0 10*3/uL (ref 0.0–0.1)
Basophils Relative: 0 %
Eosinophils Absolute: 0.3 10*3/uL (ref 0.0–0.5)
Eosinophils Relative: 4 %
HCT: 45.4 % (ref 36.0–46.0)
Hemoglobin: 13.4 g/dL (ref 12.0–15.0)
Immature Granulocytes: 0 %
Lymphocytes Relative: 33 %
Lymphs Abs: 2.3 10*3/uL (ref 0.7–4.0)
MCH: 29.1 pg (ref 26.0–34.0)
MCHC: 29.5 g/dL — ABNORMAL LOW (ref 30.0–36.0)
MCV: 98.5 fL (ref 80.0–100.0)
Monocytes Absolute: 0.5 10*3/uL (ref 0.1–1.0)
Monocytes Relative: 7 %
Neutro Abs: 3.8 10*3/uL (ref 1.7–7.7)
Neutrophils Relative %: 56 %
Platelets: 223 10*3/uL (ref 150–400)
RBC: 4.61 MIL/uL (ref 3.87–5.11)
RDW: 13.5 % (ref 11.5–15.5)
WBC: 6.9 10*3/uL (ref 4.0–10.5)
nRBC: 0 % (ref 0.0–0.2)

## 2020-11-13 LAB — GLUCOSE, CAPILLARY
Glucose-Capillary: 195 mg/dL — ABNORMAL HIGH (ref 70–99)
Glucose-Capillary: 170 mg/dL — ABNORMAL HIGH (ref 70–99)
Glucose-Capillary: 210 mg/dL — ABNORMAL HIGH (ref 70–99)
Glucose-Capillary: 173 mg/dL — ABNORMAL HIGH (ref 70–99)

## 2020-11-13 LAB — PHOSPHORUS: Phosphorus: 5 mg/dL — ABNORMAL HIGH (ref 2.5–4.6)

## 2020-11-13 LAB — MAGNESIUM: Magnesium: 1.8 mg/dL (ref 1.7–2.4)

## 2020-11-13 MED ORDER — JUVEN PO PACK
1.0000 | PACK | Freq: Two times a day (BID) | ORAL | Status: DC
  Administered 2020-11-13 – 2020-12-27 (×71): 1 via ORAL
  Filled 2020-11-13 (×92): qty 1

## 2020-11-13 MED ORDER — ENSURE MAX PROTEIN PO LIQD
11.0000 [oz_av] | Freq: Every day | ORAL | Status: DC
  Administered 2020-11-13 – 2020-11-19 (×7): 11 [oz_av] via ORAL
  Filled 2020-11-13 (×7): qty 330

## 2020-11-13 MED ORDER — PROSOURCE PLUS PO LIQD
30.0000 mL | Freq: Every day | ORAL | Status: DC
  Administered 2020-11-13 – 2020-11-25 (×13): 30 mL via ORAL
  Filled 2020-11-13 (×12): qty 30

## 2020-11-13 NOTE — Hospital Course (Addendum)
55 year old woman PMH morbid obesity treated for skin breakdown.  Pending SNF placement.  A & P  Chronic bilateral lower extremity lymphedema -- Stable, continue lymphedema pumps, PT  Diabetes mellitus type 2 -- CBG stable.  Continue Lantus and sliding scale insulin.  Essential hypertension --Diuretics discontinued.  Stable, continue metoprolol.  Morbid obesity --Not interested in bariatric surgery  Disposition --Lives alone, unable to care for self, nonambulatory.  Plan for SNF.

## 2020-11-13 NOTE — Progress Notes (Addendum)
Initial Nutrition Assessment  DOCUMENTATION CODES:   Morbid obesity  INTERVENTION:  - will order Ensure Max once/day, each supplement provides 150 kcal and 30 grams protein. - will order 30 ml Prosource Plus BID, each supplement provides 100 kcal and 15 grams protein. - will order Juven BID, each packet provides 95 calories, 2.5 grams of protein (collagen), and 9.8 grams of carbohydrate (3 grams sugar); also contains 7 grams of L-arginine and L-glutamine, 300 mg vitamin C, 15 mg vitamin E, 1.2 mcg vitamin B-12, 9.5 mg zinc, 200 mg calcium, and 1.5 g  Calcium Beta-hydroxy-Beta-methylbutyrate to support wound healing  - discussed low sodium diet and rationale for continuing this. - discussed importance of protein, ways to increase protein, and appropriate snacks.    NUTRITION DIAGNOSIS:   Increased nutrient needs related to wound healing as evidenced by estimated needs.  GOAL:   Patient will meet greater than or equal to 90% of their needs  MONITOR:   PO intake,Supplement acceptance,Labs,Weight trends,Skin  REASON FOR ASSESSMENT:   Consult Assessment of nutrition requirement/status,Diet education  ASSESSMENT:   55 year old female with medical history of type 2 DM, morbid obesity, chronic lymphedema, chronic non-healing ulcers on both legs and calves and L upper thigh. She is followed by wound clinic and followed by [place] 3 times per week until recently d/t insurance company no longer covering this service. She presented to the ED d/t worsening or wounds which have had increased drainage and pain. She is unable to change the dressings herself. At home, she did not walk but would move around the house in a rolling chair 1-2 times/day. She lives alone. Currently pending d/c to SNF but is a difficult placement d/t weight.  She has been eating 50-100% over the past 1 week. Patient LOS is 34 days and notes indicate that barrier to discharge is her weight. Consult to RD  entered yesterday; RD was not made aware of patient or nutrition-related needs prior to that time.   Able to talk with RN prior to and after visiting patient.   Patient sitting in the chair with no family or visitors present although she reports having friends who have visited her in the hospital and that an aunt was with her the first few days of hospitalization.   Patient lives alone. She has a hx of dieting and re-gaining weight and has utilized several outpatient programs and weight loss clinics including some that have taught healthy food preparation techniques.   Talked with patient about priority of wound healing and a focus on protein-rich foods. She is on a Heart Healthy, Carb Modified diet and although she misses having salt, understands the rationale of this diet and reports that when she was admitted she requested to be placed on a restricted diet as she wanted to focus on weight loss. Her biggest struggle with food is her preference for high-salt, high-sugar foods and beverages.  Patient is agreeable to oral nutrition supplements outlined above. A friend is going to bring her more packets of peanut butter and unsalted nuts.   Weight on 4/21 was 515 lb and weight today is 483 lb. Notes indicate that MD had talked with patient extensively on 5/3 about body size and that bariatric surgery intervention was offered at that time. Patient adamantly declined this option.   Patient reports that she sleeps very poorly and this had been her experience PTA as well. She reports usually falling asleep around 2300 and that she is awake many times until  0900 when she wakes for the duration of the day.  In the past she has used melatonin, ZQuil, and gummies that contained both THC and CBD. The gummies were the most affective but she stopped using them d/t sleeping too much and feeling dizzy when attempting to stand.   RN reports that patient sleeps very soundly each night.  Patient reports working  with OT around noon each day and with PT around 1400 each day. She has upper and lower body exercises to complete between sessions.    Labs reviewed; CBGs: 195 and 170 mg/dl, Phos: 5 mg/dl. Medications reviewed; sliding scale novolog, 18 units lantus/day, 400 mg mag-ox BID, 17 g miralax BID, 40 mEq Klor-Con/day.     NUTRITION - FOCUSED PHYSICAL EXAM:  completed; no muscle or fat depletions, mild edema to all extremities.  Diet Order:   Diet Order            Diet heart healthy/carb modified Room service appropriate? Yes; Fluid consistency: Thin  Diet effective now                 EDUCATION NEEDS:   Education needs have been addressed  Skin:  Skin Assessment: Skin Integrity Issues: Skin Integrity Issues:: Other (Comment) Other: L thigh and L tibia pressure injuries  Last BM:  5/24  Height:   Ht Readings from Last 1 Encounters:  10/10/20 5\' 10"  (1.778 m)    Weight:   Wt Readings from Last 1 Encounters:  11/13/20 (!) 219 kg     Estimated Nutritional Needs:  Kcal:  2000-2200 kcal Protein:  120-135 grams Fluid:  >/= 2 L/day      Jarome Matin, MS, RD, LDN, CNSC Inpatient Clinical Dietitian RD pager # available in [place]  After hours/weekend pager # available in [facility]

## 2020-11-13 NOTE — Progress Notes (Signed)
Occupational Therapy Treatment Patient Details Name: [name] MRN: [id number] DOB: [birth year] Today's Date: 11/13/2020    History of present illness Pt admitted from home with multiple LE wounds, and FTT and unable to perform basic self care.  Pt reports she stayed in lift chair getting up only twice a day for BM or getting food.  Pt utilizing Rollator around home but pivoted to it from lift chair and wheeled around in seated position.  Pt states she put pads and diapers in chair so she did not have to get up to urinate.  Pt with hx of DM and lymphadema (has been unable to use her pressure sleeves so lymphadema has significantly worsened since last December).   OT comments  Patient progressing slowly but able to showed improved ability to tolerate upright sitting in recliner with feet in dependent position on pillows to allow for increased knee flexion over time, compared to previous session. Pt also stated that she has self-increased frequency of UE HEP with theraband to 3x/day and was encouraged to continue faithfully.  Patient remains limited by pain esp to Bil LEs, severe stiffness to LEs with increased tone(?) and difficulty separating feet or lifting legs for ADLs, along with deficits noted below. Pt continues to demonstrate fair rehab potential and would benefit from continued skilled OT to increase safety and independence with ADLs and functional transfers to allow pt to return home safely and reduce caregiver burden and fall risk.   Follow Up Recommendations  SNF    Equipment Recommendations  None recommended by OT    Recommendations for Other Services      Precautions / Restrictions Precautions Precautions: Fall Precaution Comments: posterior LE wounds, place bed pad  to cushion when maxiskying. Restrictions Weight Bearing Restrictions: No       Mobility Bed Mobility Overal bed mobility: Needs Assistance Bed Mobility: Rolling Rolling: Max assist;+2 for physical  assistance;+2 for safety/equipment           Patient Response: Cooperative  Transfers Overall transfer level: Needs assistance               General transfer comment: transfer from bed to recliner via maxisky. bed pads placed at Lt knee to protect skin where pt has breakdown and wound.    Balance                                           ADL either performed or assessed with clinical judgement   ADL Overall ADL's : Needs assistance/impaired Eating/Feeding: Independent   Grooming: Set up;Sitting;Oral care;Wash/dry face;Wash/dry hands Grooming Details (indicate cue type and reason): Performed while pt in recliner and sitting upright with feet on floor (supported by pillows as needed for feet to plant)     Lower Body Bathing: Total assistance;+2 for physical assistance;+2 for safety/equipment;Bed level Lower Body Bathing Details (indicate cue type and reason): Pt required assist of 3 people for thorough LB bathing at bed level.     Lower Body Dressing: Total assistance;Bed level Lower Body Dressing Details (indicate cue type and reason): Total Assist to adjust socks.     Toileting- Clothing Manipulation and Hygiene: Total assistance;Bed level       Functional mobility during ADLs: Maximal assistance;Cueing for sequencing;+2 for physical assistance General ADL Comments: +3 for physical assist for bathing at bed level. +3 for rolling Rt and LT in bed x  2 for bathing and to place hoyer pad. Maxi-sky to recliner.  Pt tolerating feet down and head higher up on recliner today with encouragement to try. Pt allowed feet to stay down to allow for extended time in knee flexion and agree to use call bell if required repositioning.     Vision Patient Visual Report: No change from baseline     Perception     Praxis      Cognition Arousal/Alertness: Awake/alert Behavior During Therapy: WFL for tasks assessed/performed Overall Cognitive Status: Within  Functional Limits for tasks assessed                                          Exercises Other Exercises Other Exercises: Pt reports independence with UE theraband HEP and has increased frequency to 3x/day.   Shoulder Instructions       General Comments Unable to sit unsupported in bed or recliner at this time.    Pertinent Vitals/ Pain       Pain Assessment: Faces Faces Pain Scale: Hurts little more Pain Location: B knees,  back (with activity/movement, esp when laying more flat or rolling, Pain Descriptors / Indicators: Tender;Sore Pain Intervention(s): Premedicated before session;Heat applied;Limited activity within patient's tolerance;Repositioned;Monitored during session (Pt reports having a muscle relaxer prior to OT which she stated help the LE pain significantly.)  Home Living                                          Prior Functioning/Environment              Frequency  Min 2X/week        Progress Toward Goals  OT Goals(current goals can now be found in the care plan section)  Progress towards OT goals: Progressing toward goals  Acute Rehab OT Goals Patient Stated Goal: manage myself and go home OT Goal Formulation: With patient Time For Goal Achievement: 11/13/20 Potential to Achieve Goals: Fair ADL Goals Additional ADL Goal #3: Pt will increase bed mobility to rolling RT and LT with maximum assist of 1 person to decrease caregiver burden with bed level ADLs.  Plan Discharge plan remains appropriate    Co-evaluation                 AM-PAC OT "6 Clicks" Daily Activity     Outcome Measure   Help from another person eating meals?: None Help from another person taking care of personal grooming?: A Little Help from another person toileting, which includes using toliet, bedpan, or urinal?: Total Help from another person bathing (including washing, rinsing, drying)?: A Lot Help from another person to put on and  taking off regular upper body clothing?: A Lot Help from another person to put on and taking off regular lower body clothing?: Total 6 Click Score: 13    End of Session    OT Visit Diagnosis: Other abnormalities of gait and mobility (R26.89);Muscle weakness (generalized) (M62.81);Pain Pain - part of body:  (LEs)   Activity Tolerance Patient limited by pain   Patient Left in chair;with call bell/phone within reach   Nurse Communication Mobility status;Need for lift equipment        Time: 1100-1200 OT Time Calculation (min): 60 min  Charges: OT General Charges $OT Visit: 1 Visit OT  Treatments $Self Care/Home Management : 8-22 mins $Therapeutic Activity: 8-22 mins  Anderson [place], [place] Office: [phone] 11/13/2020   Julien Girt 11/13/2020, 2:40 PM

## 2020-11-13 NOTE — Progress Notes (Signed)
PROGRESS NOTE  [name] [id number] DOB: Sep 17, 1965 DOA: 10/10/2020 PCP: Fanny Bien, MD  Brief History   55 year old woman PMH morbid obesity treated for skin breakdown.  Pending SNF placement.  A & P  Chronic bilateral lower extremity lymphedema --Continue lymphedema pumps, PT  -- Pain control  Diabetes mellitus type 2 --Patient refused metformin as an outpatient.  Continue Lantus and sliding scale insulin.  Essential hypertension --Diuretics discontinued.  Continue metoprolol.  Morbid obesity --Not interested in bariatric surgery  Disposition --Lives alone, unable to care for self, nonambulatory.  Plan for SNF.   Nutritional Assessment: Body mass index is 69.28 kg/m.Marland Kitchen Seen by dietician.  I agree with the assessment and plan as outlined below: Nutrition Status: Nutrition Problem: Increased nutrient needs Etiology: wound healing Signs/Symptoms: estimated needs Interventions: Juven,Prostat,Other (Comment) (Ensure Max)  Skin Assessment: I have examined the patient's skin and I agree with the wound assessment as performed by the wound care RN as outlined below: Pressure Injury 10/11/20 Thigh Left;Posterior (Active)  10/11/20 0041  Location: Thigh  Location Orientation: Left;Posterior  Staging:   Wound Description (Comments):   Present on Admission:      Pressure Injury 10/11/20 Tibial Left;Posterior;Proximal (Active)  10/11/20 0041  Location: Tibial  Location Orientation: Left;Posterior;Proximal  Staging:   Wound Description (Comments):   Present on Admission:    Disposition Plan:  Discussion:   Status is: Inpatient  Remains inpatient appropriate because:Unsafe d/c plan   Dispo: The patient is from: Home              Anticipated d/c is to: SNF              Patient currently is not medically stable to d/c.   Difficult to place patient No  DVT prophylaxis:    Code Status: Full Code Level of care: Med-Surg Family Communication:  none  Murray Hodgkins, MD  [facility] Direct contact: see [email or web address] (further directions at bottom of note if needed) 7PM-7AM contact night coverage as at bottom of note 11/13/2020, 7:11 PM  LOS: 27 days   Significant Hospital Events   .    Consults:  .    Procedures:  .   Significant Diagnostic Tests:  Marland Kitchen    Micro Data:  .    Antimicrobials:  .   Interval History/Subjective  CC: f/u obesity  Slowly getting better.  Feels okay.  Objective   Vitals:  Vitals:   11/12/20 2201 11/13/20 1452  BP: 132/83 123/79  Pulse: 93 99  Resp: 16 16  Temp: 98.6 F (37 C) 98.2 F (36.8 C)  SpO2: 97% 93%    Exam:  Constitutional:   . Appears calm and comfortable ENMT:  . grossly normal hearing  Respiratory:  . CTA bilaterally, no w/r/r.  . Respiratory effort normal. Cardiovascular:  . RRR, no m/r/g Psychiatric:  . Mental status o Mood, affect appropriate  I have personally reviewed the following:   Today's Data  . CBG stable . CMP unremarkable . CBC unremarkable  Scheduled Meds: . (feeding supplement) PROSource Plus  30 mL Oral Daily  . acetaminophen  1,000 mg Oral QHS  . enoxaparin (LOVENOX) injection  100 mg Subcutaneous Q24H  . ferrous sulfate  325 mg Oral Daily  . hydrocortisone cream   Topical BID  . hydrOXYzine  10 mg Oral TID  . insulin aspart  0-20 Units Subcutaneous TID WC  . insulin aspart  0-5 Units Subcutaneous QHS  . insulin glargine  18 Units Subcutaneous  Daily  . lidocaine  3 patch Transdermal Q24H  . magnesium oxide  400 mg Oral BID  . metoprolol tartrate  12.5 mg Oral BID  . nutrition supplement (JUVEN)  1 packet Oral BID BM  . polyethylene glycol  17 g Oral BID  . potassium chloride  40 mEq Oral Daily  . Ensure Max Protein  11 oz Oral Daily   Continuous Infusions: . sodium chloride 250 mL (10/15/20 2301)    Principal Problem:   Wound infection Active Problems:   Hyperkalemia   Chronic peripheral venous hypertension with  lower extremity complication   Lymphedema   Obesity   Type 2 diabetes mellitus (HCC)   HTN (hypertension)   Cellulitis   LOS: 27 days   How to contact the [facility] Attending or Consulting provider [place] or covering provider during after hours [place], for this patient?  1. Check the care team in [facility] and look for a) attending/consulting TRH provider listed and b) the [facility] team listed 2. Log into [email or web address] and use [place] universal password to access. If you do not have the password, please contact the hospital operator. 3. Locate the [facility] provider you are looking for under [facility] and page to a number that you can be directly reached. 4. If you still have difficulty reaching the provider, please page the [facility] (Director on Call) for the Hospitalists listed on amion for assistance.

## 2020-11-14 DIAGNOSIS — Z6841 Body Mass Index (BMI) 40.0 and over, adult: Secondary | ICD-10-CM | POA: Diagnosis not present

## 2020-11-14 DIAGNOSIS — I890 Lymphedema, not elsewhere classified: Secondary | ICD-10-CM | POA: Diagnosis not present

## 2020-11-14 DIAGNOSIS — E1169 Type 2 diabetes mellitus with other specified complication: Secondary | ICD-10-CM | POA: Diagnosis not present

## 2020-11-14 LAB — GLUCOSE, CAPILLARY
Glucose-Capillary: 177 mg/dL — ABNORMAL HIGH (ref 70–99)
Glucose-Capillary: 200 mg/dL — ABNORMAL HIGH (ref 70–99)
Glucose-Capillary: 163 mg/dL — ABNORMAL HIGH (ref 70–99)
Glucose-Capillary: 182 mg/dL — ABNORMAL HIGH (ref 70–99)

## 2020-11-14 MED ORDER — SENNA 8.6 MG PO TABS
1.0000 | ORAL_TABLET | Freq: Every day | ORAL | Status: DC
  Administered 2020-11-14 – 2020-11-18 (×5): 8.6 mg via ORAL
  Filled 2020-11-14 (×5): qty 1

## 2020-11-14 NOTE — Progress Notes (Signed)
Physical Therapy Treatment Patient Details Name: [name] MRN: [id number] DOB: [birth year] Today's Date: 11/14/2020    History of Present Illness Pt admitted from home with multiple LE wounds, and FTT and unable to perform basic self care.  Pt reports she stayed in lift chair getting up only twice a day for BM or getting food.  Pt utilizing Rollator around home but pivoted to it from lift chair and wheeled around in seated position.  Pt states she put pads and diapers in chair so she did not have to get up to urinate.  Pt with hx of DM and lymphadema (has been unable to use her pressure sleeves so lymphadema has significantly worsened since last December).    PT Comments     Patient reclined in recliner. Placed  Legs down with back forward, seated upright. Placed sheet around trunk to assist pulling forward as patient would pull on STEDY turned backwards. Patient reports that she has not been able to lean forward PTA due to panus and back discomfort, that she used lift chair to stand and stood  From rollator quickly to transfer. So, Patient then worked on sitting up with the  recliner back tilted back and away from her back so there was no support. Patient able to let go of STEDY bar and sit  without back support for ~ 1.5 minutes. Returned pt. To bed via maxisky. Placed in partial  Bed chair position, was not as well positioned as previously, also has  A different tilt bed since 2 days ago.  Continue working on trunk control, sitting and tilting as tolerated.  Follow Up Recommendations  SNF     Equipment Recommendations  None recommended by PT    Recommendations for Other Services       Precautions / Restrictions Precautions Precautions: Fall Precaution Comments: posterior LLE wounds, place bed pad  to cushion when maxiskying.    Mobility  Bed Mobility               General bed mobility comments: patient placed in chair position of bed. Patient able to lean forward  just far enough to pull lift pad out from behind her without rolling.    Transfers                 General transfer comment: transfer from recliner to bed via maxisky. bed pads placed at Lt knee to protect posterior thigh/ skin where pt has breakdown and wound.  Ambulation/Gait                 Stairs             Wheelchair Mobility    Modified Rankin (Stroke Patients Only)       Balance                                            Cognition Arousal/Alertness: Awake/alert Behavior During Therapy: WFL for tasks assessed/performed                                          Exercises      General Comments General comments (skin integrity, edema, etc.): while in recliner seated upright, placed sheet around trunk to assist pulling forward as patient would pull on STEDY turned  backwards. patient reports that she has not been able to lean forward PTA due to panus and back discomfort. So, Patient then worked on sitting up with the  recliner back tilted back and away from her back so there was notsupport. patient able to let go of STEDY bar and sit  without back support for ~ 1.5 minutes.      Pertinent Vitals/Pain Faces Pain Scale: Hurts even more Pain Location: B knees,  back (with activity/movement, esp when laying more flat or leaning forward, Pain Descriptors / Indicators: Tender;Sore Pain Intervention(s): Monitored during session;Premedicated before session;Relaxation;Repositioned    Home Living                      Prior Function            PT Goals (current goals can now be found in the care plan section) Progress towards PT goals: Progressing toward goals    Frequency    Min 2X/week      PT Plan Current plan remains appropriate    Co-evaluation              AM-PAC PT "6 Clicks" Mobility   Outcome Measure  Help needed turning from your back to your side while in a flat bed without using  bedrails?: Total Help needed moving from lying on your back to sitting on the side of a flat bed without using bedrails?: Total Help needed moving to and from a bed to a chair (including a wheelchair)?: Total Help needed standing up from a chair using your arms (e.g., wheelchair or bedside chair)?: Total Help needed to walk in hospital room?: Total Help needed climbing 3-5 steps with a railing? : Total 6 Click Score: 6    End of Session   Activity Tolerance: Patient tolerated treatment well Patient left: in bed;with call bell/phone within reach (with chair position of bed) Nurse Communication: Mobility status;Need for lift equipment PT Visit Diagnosis: Muscle weakness (generalized) (M62.81);Difficulty in walking, not elsewhere classified (R26.2);Pain;Adult, failure to thrive (R62.7) Pain - part of body: Knee;Leg;Hip     Time: 0349-1791 PT Time Calculation (min) (ACUTE ONLY): 70 min  Charges:  $Therapeutic Activity: 68-82 mins                     [name] PT Acute Rehabilitation Services Pager [phone] Office [phone]    [name] 11/14/2020, 4:58 PM

## 2020-11-14 NOTE — Plan of Care (Signed)
  Problem: Pain Managment: Goal: General experience of comfort will improve Outcome: Progressing   Problem: Safety: Goal: Ability to remain free from injury will improve Outcome: Progressing   Problem: Skin Integrity: Goal: Risk for impaired skin integrity will decrease Outcome: Progressing   

## 2020-11-14 NOTE — TOC Progression Note (Signed)
Transition of Care [facility]) - Progression Note    Patient Details  Name: [name] MRN: [id number] Date of Birth: 1965-09-19  [facility] CM/SW Contact  Lennart Pall, LCSW Phone Number: 11/14/2020, 3:06 PM  Clinical Narrative:    Met with pt today to review continued search for SNF bed. We, also, discussed need to continue focusing on weight loss and mobility while here which will directly affect SNF bed options.  Pt understand this and in agreement.     Expected Discharge Plan: [place] Barriers to Discharge: Continued Medical Work up  Expected Discharge Plan and Services Expected Discharge Plan: [place] In-house Referral: Clinical Social Work Discharge Planning Services: CM Consult Post Acute Care Choice: [place] Living arrangements for the past 2 months: Apartment                                       Social Determinants of Health (SDOH) Interventions    Readmission Risk Interventions No flowsheet data found.

## 2020-11-14 NOTE — Progress Notes (Signed)
Patient pre-medicated earlier for dressing changes, old dressings removed from posterior left thigh and posterior left knee, areas cleansed with nss, aquacel silver applied to each open wound and covered with square mepilex dressings, 2 large foam dressings applied to bilateral buttocks as preventative measure, lower body cleansed with incontinence cleanser and new bed pads were placed under patient, pillows placed under arms and legs, k-pads to each knee, patient now resting comfortably, call bell in reach, will continue to monitor.

## 2020-11-14 NOTE — Progress Notes (Signed)
PROGRESS NOTE  [name] [id number] DOB: 1965-06-28 DOA: 10/10/2020 PCP: Fanny Bien, MD  Brief History   55 year old woman PMH morbid obesity treated for skin breakdown.  Pending SNF placement.  A & P  Chronic bilateral lower extremity lymphedema --Continue lymphedema pumps, PT  -- Pain control  Diabetes mellitus type 2 --Patient refused metformin as an outpatient.   --CBG stable, continue Lantus and sliding scale insulin.  Essential hypertension --Diuretics discontinued.  Continue metoprolol.  Morbid obesity --Not interested in bariatric surgery  Disposition --Lives alone, unable to care for self, nonambulatory.  Plan for SNF.   Nutritional Assessment: Body mass index is 70.51 kg/m.Marland Kitchen Seen by dietician.  I agree with the assessment and plan as outlined below: Nutrition Status: Nutrition Problem: Increased nutrient needs Etiology: wound healing Signs/Symptoms: estimated needs Interventions: Juven,Prostat,Other (Comment) (Ensure Max)  Skin Assessment: I have examined the patient's skin and I agree with the wound assessment as performed by the wound care RN as outlined below: Pressure Injury 10/11/20 Thigh Left;Posterior (Active)  10/11/20 0041  Location: Thigh  Location Orientation: Left;Posterior  Staging:   Wound Description (Comments):   Present on Admission:      Pressure Injury 10/11/20 Tibial Left;Posterior;Proximal (Active)  10/11/20 0041  Location: Tibial  Location Orientation: Left;Posterior;Proximal  Staging:   Wound Description (Comments):   Present on Admission:    Disposition Plan:  Discussion:   Status is: Inpatient  Remains inpatient appropriate because:Unsafe d/c plan   Dispo: The patient is from: Home              Anticipated d/c is to: SNF              Patient currently is not medically stable to d/c.   Difficult to place patient No  DVT prophylaxis:    Code Status: Full Code Level of care: Med-Surg Family  Communication: none  Murray Hodgkins, MD  [facility] Direct contact: see [email or web address] (further directions at bottom of note if needed) 7PM-7AM contact night coverage as at bottom of note 11/14/2020, 4:49 PM  LOS: 28 days   Significant Hospital Events   .    Consults:  .    Procedures:  .   Significant Diagnostic Tests:  Marland Kitchen    Micro Data:  .    Antimicrobials:  .   Interval History/Subjective  CC: f/u obesity  Overall doing okay.  Objective   Vitals:  Vitals:   11/14/20 0529 11/14/20 1320  BP: (!) 154/83 135/84  Pulse: 99 93  Resp: 18 18  Temp: 98.5 F (36.9 C)   SpO2: 91% 92%    Exam:  Constitutional:   . Appears calm and comfortable ENMT:  . grossly normal hearing  Respiratory:  . CTA bilaterally, no w/r/r.  . Respiratory effort normal. Cardiovascular:  . RRR, no m/r/g Psychiatric:  . Mental status o Mood, affect appropriate  I have personally reviewed the following:   Today's Data  . CBG stable.  Scheduled Meds: . (feeding supplement) PROSource Plus  30 mL Oral Daily  . acetaminophen  1,000 mg Oral QHS  . enoxaparin (LOVENOX) injection  100 mg Subcutaneous Q24H  . ferrous sulfate  325 mg Oral Daily  . hydrocortisone cream   Topical BID  . hydrOXYzine  10 mg Oral TID  . insulin aspart  0-20 Units Subcutaneous TID WC  . insulin aspart  0-5 Units Subcutaneous QHS  . insulin glargine  18 Units Subcutaneous Daily  . lidocaine  3 patch Transdermal  Q24H  . magnesium oxide  400 mg Oral BID  . metoprolol tartrate  12.5 mg Oral BID  . nutrition supplement (JUVEN)  1 packet Oral BID BM  . polyethylene glycol  17 g Oral BID  . potassium chloride  40 mEq Oral Daily  . Ensure Max Protein  11 oz Oral Daily   Continuous Infusions: . sodium chloride 250 mL (10/15/20 2301)    Principal Problem:   Wound infection Active Problems:   Hyperkalemia   Chronic peripheral venous hypertension with lower extremity complication   Lymphedema    Obesity   Type 2 diabetes mellitus (HCC)   HTN (hypertension)   Cellulitis   LOS: 28 days   How to contact the [facility] Attending or Consulting provider [place] or covering provider during after hours [place], for this patient?  1. Check the care team in [facility] and look for a) attending/consulting TRH provider listed and b) the [facility] team listed 2. Log into [email or web address] and use [place]'s universal password to access. If you do not have the password, please contact the hospital operator. 3. Locate the [facility] provider you are looking for under [facility] and page to a number that you can be directly reached. 4. If you still have difficulty reaching the provider, please page the [facility] (Director on Call) for the Hospitalists listed on amion for assistance.

## 2020-11-15 DIAGNOSIS — E1169 Type 2 diabetes mellitus with other specified complication: Secondary | ICD-10-CM | POA: Diagnosis not present

## 2020-11-15 DIAGNOSIS — Z6841 Body Mass Index (BMI) 40.0 and over, adult: Secondary | ICD-10-CM | POA: Diagnosis not present

## 2020-11-15 LAB — GLUCOSE, CAPILLARY
Glucose-Capillary: 171 mg/dL — ABNORMAL HIGH (ref 70–99)
Glucose-Capillary: 169 mg/dL — ABNORMAL HIGH (ref 70–99)
Glucose-Capillary: 150 mg/dL — ABNORMAL HIGH (ref 70–99)
Glucose-Capillary: 197 mg/dL — ABNORMAL HIGH (ref 70–99)

## 2020-11-15 NOTE — Progress Notes (Signed)
PROGRESS NOTE  [name]:[id number] DOB: Apr 29, 1966 DOA: 10/10/2020 PCP: Fanny Bien, MD  Brief History   55 year old woman PMH morbid obesity treated for skin breakdown.  Pending SNF placement.  A & P  Chronic bilateral lower extremity lymphedema --Stable. Continue lymphedema pumps, PT. --Continue pain control  Diabetes mellitus type 2 --Patient refused metformin as an outpatient.   --CBG stable, continue Lantus and sliding scale insulin.  Essential hypertension --Diuretics discontinued.  Continue metoprolol.  Morbid obesity --Not interested in bariatric surgery  Disposition --Lives alone, unable to care for self, nonambulatory.  Plan for SNF.   Nutritional Assessment: Body mass index is 68.9 kg/m.Marland Kitchen Seen by dietician.  I agree with the assessment and plan as outlined below: Nutrition Status: Nutrition Problem: Increased nutrient needs Etiology: wound healing Signs/Symptoms: estimated needs Interventions: Juven,Prostat,Other (Comment) (Ensure Max)  Skin Assessment: I have examined the patient's skin and I agree with the wound assessment as performed by the wound care RN as outlined below: Pressure Injury 10/11/20 Thigh Left;Posterior (Active)  10/11/20 0041  Location: Thigh  Location Orientation: Left;Posterior  Staging:   Wound Description (Comments):   Present on Admission:      Pressure Injury 10/11/20 Tibial Left;Posterior;Proximal (Active)  10/11/20 0041  Location: Tibial  Location Orientation: Left;Posterior;Proximal  Staging:   Wound Description (Comments):   Present on Admission:    Disposition Plan:  Discussion:   Status is: Inpatient  Remains inpatient appropriate because:Unsafe d/c plan   Dispo: The patient is from: Home              Anticipated d/c is to: SNF              Patient currently is not medically stable to d/c.   Difficult to place patient No  DVT prophylaxis:    Code Status: Full Code Level of care:  Med-Surg Family Communication: none  Murray Hodgkins, MD  [facility] Direct contact: see [email or web address] (further directions at bottom of note if needed) 7PM-7AM contact night coverage as at bottom of note 11/15/2020, 3:26 PM  LOS: 29 days   Significant Hospital Events   .    Consults:  .    Procedures:  .   Significant Diagnostic Tests:  Marland Kitchen    Micro Data:  .    Antimicrobials:  .   Interval History/Subjective  CC: f/u obesity  Feels ok today  Objective   Vitals:  Vitals:   11/15/20 0534 11/15/20 1342  BP: 136/85 120/70  Pulse: (!) 102 86  Resp: 18 17  Temp: 97.8 F (36.6 C) 98.3 F (36.8 C)  SpO2: (!) 88% 94%    Exam:  Constitutional:   . Appears calm and comfortable ENMT:  . grossly normal hearing  Respiratory:  . CTA bilaterally, no w/r/r.  . Respiratory effort normal.  Cardiovascular:  . RRR, no m/r/g Psychiatric:  . Mental status o Mood, affect appropriate  I have personally reviewed the following:   Today's Data  . CBG remains stable.  Scheduled Meds: . (feeding supplement) PROSource Plus  30 mL Oral Daily  . acetaminophen  1,000 mg Oral QHS  . enoxaparin (LOVENOX) injection  100 mg Subcutaneous Q24H  . ferrous sulfate  325 mg Oral Daily  . hydrocortisone cream   Topical BID  . hydrOXYzine  10 mg Oral TID  . insulin aspart  0-20 Units Subcutaneous TID WC  . insulin aspart  0-5 Units Subcutaneous QHS  . insulin glargine  18 Units Subcutaneous Daily  .  lidocaine  3 patch Transdermal Q24H  . magnesium oxide  400 mg Oral BID  . metoprolol tartrate  12.5 mg Oral BID  . nutrition supplement (JUVEN)  1 packet Oral BID BM  . polyethylene glycol  17 g Oral BID  . potassium chloride  40 mEq Oral Daily  . Ensure Max Protein  11 oz Oral Daily  . senna  1 tablet Oral QHS   Continuous Infusions: . sodium chloride 250 mL (10/15/20 2301)    Principal Problem:   Wound infection Active Problems:   Hyperkalemia   Chronic peripheral  venous hypertension with lower extremity complication   Lymphedema   Obesity   Type 2 diabetes mellitus (HCC)   HTN (hypertension)   Cellulitis   LOS: 29 days   How to contact the [facility] Attending or Consulting provider [place] or covering provider during after hours [place], for this patient?  1. Check the care team in [facility] and look for a) attending/consulting TRH provider listed and b) the [facility] team listed 2. Log into [email or web address] and use [place]'s universal password to access. If you do not have the password, please contact the hospital operator. 3. Locate the [facility] provider you are looking for under [facility] and page to a number that you can be directly reached. 4. If you still have difficulty reaching the provider, please page the [facility] (Director on Call) for the Hospitalists listed on amion for assistance.

## 2020-11-15 NOTE — Plan of Care (Signed)
  Problem: Education: Goal: Knowledge of General Education information will improve Description: Including pain rating scale, medication(s)/side effects and non-pharmacologic comfort measures Outcome: Progressing   Problem: Health Behavior/Discharge Planning: Goal: Ability to manage health-related needs will improve Outcome: Progressing   Problem: Skin Integrity: Goal: Risk for impaired skin integrity will decrease Outcome: Progressing   

## 2020-11-15 NOTE — Progress Notes (Signed)
Occupational Therapy Treatment Patient Details Name: [name] MRN: [id number] DOB: Aug 20, 1965 Today's Date: 11/15/2020    History of present illness Pt admitted from home with multiple LE wounds, and FTT and unable to perform basic self care.  Pt reports she stayed in lift chair getting up only twice a day for BM or getting food.  Pt utilizing Rollator around home but pivoted to it from lift chair and wheeled around in seated position.  Pt states she put pads and diapers in chair so she did not have to get up to urinate.  Pt with hx of DM and lymphadema (has been unable to use her pressure sleeves so lymphadema has significantly worsened since last December).   OT comments  Treatment focused on rolling in bed to assist with reducing caregiver burden during bed level ADLs. Min assist to roll to the right with patient using bed rails and therapist using bed pad to help pull patient over. Rolling impaired by LE muscle spasms/tone resulting in ankles crossing and blocking forward leg movement. Patient's muscles so tight in response to pain and due to weakness patient unable to advance lower extremity without assistance. Patient able to roll onto her back with assistance to keep ankles from crossing. Roll to the left required + 2 assistance once again predominantly due to muscle spasms/tightness/abnormal muscle recruitment - resuting again in ankles crossing and strong resistance of any movement with movement (hips adducted, knees maintained in partial flexed position. With attempts to manuever lower legs - uncross ankles and to bring one leg over the other (knee towards bed rail on both sides) significant pain and resistance limited ability to range LE. Patient reports this resistance is involuntary and has to actively try to relax. Patient reports significant pain in the hips when attempting to manuever LEs. Overall one assist to roll the right and +2 assistance to roll the lift.    Follow Up  Recommendations  SNF    Equipment Recommendations  None recommended by OT    Recommendations for Other Services      Precautions / Restrictions Precautions Precautions: Fall Precaution Comments: posterior LLE wounds, place bed pad  to cushion when maxiskying.       Mobility Bed Mobility Overal bed mobility: Needs Assistance Bed Mobility: Rolling Rolling: Max assist;Mod assist;+2 for physical assistance         General bed mobility comments: see note    Transfers                      Balance                                           ADL either performed or assessed with clinical judgement   ADL                                               Vision Patient Visual Report: No change from baseline     Perception     Praxis      Cognition Arousal/Alertness: Awake/alert Behavior During Therapy: WFL for tasks assessed/performed Overall Cognitive Status: Within Functional Limits for tasks assessed  Exercises     Shoulder Instructions       General Comments      Pertinent Vitals/ Pain       Pain Assessment: Faces Pain Score: 10-Worst pain ever Pain Location: hips, knees, back spasams Pain Descriptors / Indicators: Grimacing;Crying;Guarding Pain Intervention(s): Limited activity within patient's tolerance;Repositioned;Relaxation  Home Living                                          Prior Functioning/Environment              Frequency  Min 2X/week        Progress Toward Goals  OT Goals(current goals can now be found in the care plan section)  Progress towards OT goals: Progressing toward goals  Acute Rehab OT Goals Patient Stated Goal: manage myself and go home OT Goal Formulation: With patient Time For Goal Achievement: 11/27/20 Potential to Achieve Goals: [place] Discharge plan remains appropriate     Co-evaluation          OT goals addressed during session:  (functional mobility)      AM-PAC OT "6 Clicks" Daily Activity     Outcome Measure   Help from another person eating meals?: None Help from another person taking care of personal grooming?: A Little Help from another person toileting, which includes using toliet, bedpan, or urinal?: Total Help from another person bathing (including washing, rinsing, drying)?: A Lot Help from another person to put on and taking off regular upper body clothing?: A Lot Help from another person to put on and taking off regular lower body clothing?: Total 6 Click Score: 13    End of Session    OT Visit Diagnosis: Other abnormalities of gait and mobility (R26.89);Muscle weakness (generalized) (M62.81);Pain   Activity Tolerance Patient limited by pain   Patient Left in bed;with call bell/phone within reach   Nurse Communication Mobility status        Time: 1001-1033 OT Time Calculation (min): 32 min  Charges: OT General Charges $OT Visit: 1 Visit OT Treatments $Therapeutic Activity: 23-37 mins  Lametria Klunk, OTR/L [place]  Office [phone] Pager: [place] 11/15/2020, 12:58 PM

## 2020-11-16 DIAGNOSIS — Z6841 Body Mass Index (BMI) 40.0 and over, adult: Secondary | ICD-10-CM | POA: Diagnosis not present

## 2020-11-16 LAB — GLUCOSE, CAPILLARY
Glucose-Capillary: 180 mg/dL — ABNORMAL HIGH (ref 70–99)
Glucose-Capillary: 160 mg/dL — ABNORMAL HIGH (ref 70–99)
Glucose-Capillary: 186 mg/dL — ABNORMAL HIGH (ref 70–99)
Glucose-Capillary: 145 mg/dL — ABNORMAL HIGH (ref 70–99)

## 2020-11-16 NOTE — Plan of Care (Signed)
  Problem: Education: Goal: Knowledge of General Education information will improve Description Including pain rating scale, medication(s)/side effects and non-pharmacologic comfort measures Outcome: Progressing   Problem: Health Behavior/Discharge Planning: Goal: Ability to manage health-related needs will improve Outcome: Progressing   

## 2020-11-16 NOTE — Progress Notes (Signed)
PROGRESS NOTE  [name] GXQ:[id number] DOB: Jul 06, 1965 DOA: 10/10/2020 PCP: Fanny Bien, MD  Brief History   55 year old woman PMH morbid obesity treated for skin breakdown.  Pending SNF placement.  A & P  Chronic bilateral lower extremity lymphedema --Stable. Continue lymphedema pumps, PT. --Continue pain control  Diabetes mellitus type 2 --Patient refused metformin as an outpatient.   --CBG stable, continue Lantus and sliding scale insulin.  Essential hypertension --Diuretics discontinued.  Continue metoprolol.  Morbid obesity --Not interested in bariatric surgery  Disposition --Lives alone, unable to care for self, nonambulatory.  Plan for SNF.   Nutritional Assessment: Body mass index is 70.19 kg/m.Marland Kitchen Seen by dietician.  I agree with the assessment and plan as outlined below: Nutrition Status: Nutrition Problem: Increased nutrient needs Etiology: wound healing Signs/Symptoms: estimated needs Interventions: Juven,Prostat,Other (Comment) (Ensure Max)  Skin Assessment: I have examined the patient's skin and I agree with the wound assessment as performed by the wound care RN as outlined below: Pressure Injury 10/11/20 Thigh Left;Posterior (Active)  10/11/20 0041  Location: Thigh  Location Orientation: Left;Posterior  Staging:   Wound Description (Comments):   Present on Admission:      Pressure Injury 10/11/20 Tibial Left;Posterior;Proximal (Active)  10/11/20 0041  Location: Tibial  Location Orientation: Left;Posterior;Proximal  Staging:   Wound Description (Comments):   Present on Admission:    Disposition Plan:  Discussion:   Status is: Inpatient  Remains inpatient appropriate because:Unsafe d/c plan   Dispo: The patient is from: Home              Anticipated d/c is to: SNF              Patient currently is not medically stable to d/c.   Difficult to place patient No  DVT prophylaxis:    Code Status: Full Code Level of care:  Med-Surg Family Communication: none  Murray Hodgkins, MD  [facility] Direct contact: see [email or web address] (further directions at bottom of note if needed) 7PM-7AM contact night coverage as at bottom of note 11/16/2020, 2:45 PM  LOS: 30 days   Significant Hospital Events   .    Consults:  .    Procedures:  .   Significant Diagnostic Tests:  Marland Kitchen    Micro Data:  .    Antimicrobials:  .   Interval History/Subjective  CC: f/u obesity  Feels okay today.  Objective   Vitals:  Vitals:   11/16/20 1102 11/16/20 1404  BP: 130/77 120/80  Pulse: (!) 104 91  Resp:  17  Temp:  98.7 F (37.1 C)  SpO2:  91%    Exam:  Constitutional:   . Appears calm and comfortable ENMT:  . grossly normal hearing  Respiratory:  . CTA bilaterally, no w/r/r.  . Respiratory effort normal. Cardiovascular:  . RRR, no m/r/g Psychiatric:  . Mental status o Mood, affect appropriate  I have personally reviewed the following:   Today's Data  . CBG stable.  Scheduled Meds: . (feeding supplement) PROSource Plus  30 mL Oral Daily  . acetaminophen  1,000 mg Oral QHS  . enoxaparin (LOVENOX) injection  100 mg Subcutaneous Q24H  . ferrous sulfate  325 mg Oral Daily  . hydrocortisone cream   Topical BID  . hydrOXYzine  10 mg Oral TID  . insulin aspart  0-20 Units Subcutaneous TID WC  . insulin aspart  0-5 Units Subcutaneous QHS  . insulin glargine  18 Units Subcutaneous Daily  . magnesium oxide  400 mg  Oral BID  . metoprolol tartrate  12.5 mg Oral BID  . nutrition supplement (JUVEN)  1 packet Oral BID BM  . polyethylene glycol  17 g Oral BID  . potassium chloride  40 mEq Oral Daily  . Ensure Max Protein  11 oz Oral Daily  . senna  1 tablet Oral QHS   Continuous Infusions: . sodium chloride Stopped (11/15/20 1816)    Principal Problem:   Wound infection Active Problems:   Hyperkalemia   Chronic peripheral venous hypertension with lower extremity complication   Lymphedema    Obesity   Type 2 diabetes mellitus (HCC)   HTN (hypertension)   Cellulitis   LOS: 30 days   How to contact the [facility] Attending or Consulting provider [place] or covering provider during after hours [place], for this patient?  1. Check the care team in [facility] and look for a) attending/consulting TRH provider listed and b) the [facility] team listed 2. Log into [email or web address] and use [place]'s universal password to access. If you do not have the password, please contact the hospital operator. 3. Locate the [facility] provider you are looking for under [facility] and page to a number that you can be directly reached. 4. If you still have difficulty reaching the provider, please page the [facility] (Director on Call) for the Hospitalists listed on amion for assistance.

## 2020-11-16 NOTE — Progress Notes (Signed)
Patient up in recliner for 3 hours today. Patient is now back in bed.

## 2020-11-16 NOTE — Progress Notes (Signed)
Patient up to the recliner.

## 2020-11-17 DIAGNOSIS — E1169 Type 2 diabetes mellitus with other specified complication: Secondary | ICD-10-CM | POA: Diagnosis not present

## 2020-11-17 DIAGNOSIS — Z6841 Body Mass Index (BMI) 40.0 and over, adult: Secondary | ICD-10-CM | POA: Diagnosis not present

## 2020-11-17 LAB — GLUCOSE, CAPILLARY
Glucose-Capillary: 172 mg/dL — ABNORMAL HIGH (ref 70–99)
Glucose-Capillary: 171 mg/dL — ABNORMAL HIGH (ref 70–99)
Glucose-Capillary: 132 mg/dL — ABNORMAL HIGH (ref 70–99)
Glucose-Capillary: 145 mg/dL — ABNORMAL HIGH (ref 70–99)

## 2020-11-17 NOTE — Progress Notes (Signed)
PROGRESS NOTE  [name] [id number] DOB: May 24, 1966 DOA: 10/10/2020 PCP: Fanny Bien, MD  Brief History   55 year old woman PMH morbid obesity treated for skin breakdown.  Pending SNF placement.  A & P  Chronic bilateral lower extremity lymphedema --Stable. Continue lymphedema pumps, PT. --Continue pain control  Diabetes mellitus type 2 --Patient refused metformin as an outpatient.   --CBG stable, continue Lantus and sliding scale insulin.  Essential hypertension --Diuretics discontinued.  Continue metoprolol.  Morbid obesity --Not interested in bariatric surgery  Disposition --Lives alone, unable to care for self, nonambulatory.  Plan for SNF.   Nutritional Assessment: Body mass index is 70.19 kg/m.Marland Kitchen Seen by dietician.  I agree with the assessment and plan as outlined below: Nutrition Status: Nutrition Problem: Increased nutrient needs Etiology: wound healing Signs/Symptoms: estimated needs Interventions: Juven,Prostat,Other (Comment) (Ensure Max)  Skin Assessment: I have examined the patient's skin and I agree with the wound assessment as performed by the wound care RN as outlined below: Pressure Injury 10/11/20 Thigh Left;Posterior (Active)  10/11/20 0041  Location: Thigh  Location Orientation: Left;Posterior  Staging:   Wound Description (Comments):   Present on Admission:      Pressure Injury 10/11/20 Tibial Left;Posterior;Proximal (Active)  10/11/20 0041  Location: Tibial  Location Orientation: Left;Posterior;Proximal  Staging:   Wound Description (Comments):   Present on Admission:    Disposition Plan:  Discussion:   Status is: Inpatient  Remains inpatient appropriate because:Unsafe d/c plan   Dispo: The patient is from: Home              Anticipated d/c is to: SNF              Patient currently is not medically stable to d/c.   Difficult to place patient No  DVT prophylaxis:    Code Status: Full Code Level of care:  Med-Surg Family Communication: none  Murray Hodgkins, MD  [facility] Direct contact: see [email or web address] (further directions at bottom of note if needed) 7PM-7AM contact night coverage as at bottom of note 11/17/2020, 2:03 PM  LOS: 31 days   Significant Hospital Events   .    Consults:  .    Procedures:  .   Significant Diagnostic Tests:  Marland Kitchen    Micro Data:  .    Antimicrobials:  .   Interval History/Subjective  CC: f/u obesity  Would like to change diet to liquids, doesn't like solid food  Feels ok  Objective   Vitals:  Vitals:   11/17/20 0528 11/17/20 1252  BP: 126/70 113/71  Pulse: 97 93  Resp: 18 18  Temp: 98 F (36.7 C) 98.2 F (36.8 C)  SpO2: 95% 96%    Exam: Constitutional:   . Appears calm and comfortable Psychiatric:  . Mental status o Mood, affect appropriate  I have personally reviewed the following:   Today's Data  . CBG remains stable.  Scheduled Meds: . (feeding supplement) PROSource Plus  30 mL Oral Daily  . acetaminophen  1,000 mg Oral QHS  . enoxaparin (LOVENOX) injection  100 mg Subcutaneous Q24H  . ferrous sulfate  325 mg Oral Daily  . hydrocortisone cream   Topical BID  . hydrOXYzine  10 mg Oral TID  . insulin aspart  0-20 Units Subcutaneous TID WC  . insulin aspart  0-5 Units Subcutaneous QHS  . insulin glargine  18 Units Subcutaneous Daily  . magnesium oxide  400 mg Oral BID  . metoprolol tartrate  12.5 mg Oral BID  .  nutrition supplement (JUVEN)  1 packet Oral BID BM  . polyethylene glycol  17 g Oral BID  . potassium chloride  40 mEq Oral Daily  . Ensure Max Protein  11 oz Oral Daily  . senna  1 tablet Oral QHS   Continuous Infusions: . sodium chloride Stopped (11/15/20 1816)    Principal Problem:   Wound infection Active Problems:   Hyperkalemia   Chronic peripheral venous hypertension with lower extremity complication   Lymphedema   Obesity   Type 2 diabetes mellitus (HCC)   HTN (hypertension)    Cellulitis   LOS: 31 days   How to contact the [facility] Attending or Consulting provider [place] or covering provider during after hours [place], for this patient?  1. Check the care team in [facility] and look for a) attending/consulting TRH provider listed and b) the [facility] team listed 2. Log into [email or web address] and use [place]'s universal password to access. If you do not have the password, please contact the hospital operator. 3. Locate the [facility] provider you are looking for under [facility] and page to a number that you can be directly reached. 4. If you still have difficulty reaching the provider, please page the [facility] (Director on Call) for the Hospitalists listed on amion for assistance.

## 2020-11-17 NOTE — Plan of Care (Signed)
  Problem: Education: Goal: Knowledge of General Education information will improve Description Including pain rating scale, medication(s)/side effects and non-pharmacologic comfort measures Outcome: Progressing   

## 2020-11-18 DIAGNOSIS — E1169 Type 2 diabetes mellitus with other specified complication: Secondary | ICD-10-CM | POA: Diagnosis not present

## 2020-11-18 DIAGNOSIS — I890 Lymphedema, not elsewhere classified: Secondary | ICD-10-CM | POA: Diagnosis not present

## 2020-11-18 LAB — GLUCOSE, CAPILLARY
Glucose-Capillary: 161 mg/dL — ABNORMAL HIGH (ref 70–99)
Glucose-Capillary: 204 mg/dL — ABNORMAL HIGH (ref 70–99)
Glucose-Capillary: 150 mg/dL — ABNORMAL HIGH (ref 70–99)
Glucose-Capillary: 172 mg/dL — ABNORMAL HIGH (ref 70–99)

## 2020-11-18 NOTE — Progress Notes (Signed)
Physical Therapy Treatment Patient Details Name: [name] MRN: [id number] DOB: 1965/07/09 Today's Date: 11/18/2020    History of Present Illness Pt admitted from home with multiple LE wounds, and FTT and unable to perform basic self care.  Pt reports she stayed in lift chair getting up only twice a day for BM or getting food.  Pt utilizing Rollator around home but pivoted to it from lift chair and wheeled around in seated position.  Pt states she put pads and diapers in chair so she did not have to get up to urinate.  Pt with hx of DM and lymphadema (has been unable to use her pressure sleeves so lymphadema has significantly worsened since last December).    PT Comments    The  Patient worked on sitting with legs dependent, attempting to lean more forward, unable to tolerate greater than 10 minutes due to reports of pain. Assisted with AAROM exercises of both legs and trunk rotation. Continue  Tilt bed next visit and bed chair position to facilitate knee and hip flexion.  Follow Up Recommendations  SNF     Equipment Recommendations    none  Recommendations for Other Services       Precautions / Restrictions Precautions Precautions: Fall Precaution Comments: posterior LLE wounds, place bed pad  to cushion when maxiskying.    Mobility  Bed Mobility               General bed mobility comments: in recliner  by nursing    Transfers                 General transfer comment: maxisky  Ambulation/Gait                 Stairs             Wheelchair Mobility    Modified Rankin (Stroke Patients Only)       Balance                                            Cognition Arousal/Alertness: Awake/alert                                            Exercises Other Exercises Other Exercises: AA int. rotation of each leg x 10, patient rotating trunk to facilitate rotation    General Comments General comments  (skin integrity, edema, etc.): attempted sitting more upright with legs dependnet, Clarise Cruz stedy in front. patient tolerated legs down foor ~ 5 minutes and reporte too painful to tolerate so legs elevated. Unable to work on trunk  control with no back support      Pertinent Vitals/Pain Faces Pain Scale: (P) Hurts whole lot Pain Location: (P) both hips and knees    Home Living                      Prior Function            PT Goals (current goals can now be found in the care plan section) Progress towards PT goals: Progressing toward goals    Frequency    Min 2X/week      PT Plan Current plan remains appropriate    Co-evaluation  AM-PAC PT "6 Clicks" Mobility   Outcome Measure  Help needed turning from your back to your side while in a flat bed without using bedrails?: Total Help needed moving from lying on your back to sitting on the side of a flat bed without using bedrails?: Total Help needed moving to and from a bed to a chair (including a wheelchair)?: Total Help needed standing up from a chair using your arms (e.g., wheelchair or bedside chair)?: Total Help needed to walk in hospital room?: Total Help needed climbing 3-5 steps with a railing? : Total 6 Click Score: 6    End of Session   Activity Tolerance: Patient limited by pain Patient left: in chair;with call bell/phone within reach Nurse Communication: Mobility status;Need for lift equipment PT Visit Diagnosis: Muscle weakness (generalized) (M62.81);Difficulty in walking, not elsewhere classified (R26.2);Pain;Adult, failure to thrive (R62.7) Pain - part of body: Knee;Leg;Hip     Time: 1400-1450 PT Time Calculation (min) (ACUTE ONLY): 50 min  Charges:  $Therapeutic Exercise: 23-37 mins $Therapeutic Activity: 8-22 mins                     [name] PT Acute Rehabilitation Services Pager [phone] Office [phone]    [name] 11/18/2020, 3:12 PM

## 2020-11-18 NOTE — Progress Notes (Signed)
PROGRESS NOTE  [name]:[id number] DOB: Oct 23, 1965 DOA: 10/10/2020 PCP: Fanny Bien, MD  Brief History   55 year old woman PMH morbid obesity treated for skin breakdown.  Pending SNF placement.  A & P  Chronic bilateral lower extremity lymphedema -- Remained stable, continue lymphedema pumps, PT  Diabetes mellitus type 2 -- CBG remained stable.  Continue Lantus and sliding scale insulin.  Essential hypertension --Diuretics discontinued.  Stable, continue metoprolol.  Morbid obesity --Not interested in bariatric surgery  Disposition --Lives alone, unable to care for self, nonambulatory.  Plan for SNF.   Nutritional Assessment: Body mass index is 69.47 kg/m.Marland Kitchen Seen by dietician.  I agree with the assessment and plan as outlined below: Nutrition Status: Nutrition Problem: Increased nutrient needs Etiology: wound healing Signs/Symptoms: estimated needs Interventions: Juven,Prostat,Other (Comment) (Ensure Max)  Skin Assessment: I have examined the patient's skin and I agree with the wound assessment as performed by the wound care RN as outlined below: Pressure Injury 10/11/20 Thigh Left;Posterior (Active)  10/11/20 0041  Location: Thigh  Location Orientation: Left;Posterior  Staging:   Wound Description (Comments):   Present on Admission:      Pressure Injury 10/11/20 Tibial Left;Posterior;Proximal (Active)  10/11/20 0041  Location: Tibial  Location Orientation: Left;Posterior;Proximal  Staging:   Wound Description (Comments):   Present on Admission:    Disposition Plan:  Discussion:   Status is: Inpatient  Remains inpatient appropriate because:Unsafe d/c plan   Dispo: The patient is from: Home              Anticipated d/c is to: SNF              Patient currently is not medically stable to d/c.   Difficult to place patient No  DVT prophylaxis:    Code Status: Full Code Level of care: Med-Surg Family Communication: none  Murray Hodgkins, MD  [facility] Direct contact: see [email or web address] (further directions at bottom of note if needed) 7PM-7AM contact night coverage as at bottom of note 11/18/2020, 1:35 PM  LOS: 32 days   Significant Hospital Events   .    Consults:  .    Procedures:  .   Significant Diagnostic Tests:  Marland Kitchen    Micro Data:  .    Antimicrobials:  .   Interval History/Subjective  CC: f/u obesity  No new issues  Objective   Vitals:  Vitals:   11/17/20 2149 11/18/20 0440  BP: 123/71 115/68  Pulse: 94 93  Resp: 16 16  Temp: 97.6 F (36.4 C) 98.6 F (37 C)  SpO2: 95% 95%    Exam: Constitutional:   . Appears calm and comfortable Respiratory:  . Respiratory effort appears normal Psychiatric:  . Mental status o Mood, affect appropriate  I have personally reviewed the following:   Today's Data  . CBG remains stable.  Scheduled Meds: . (feeding supplement) PROSource Plus  30 mL Oral Daily  . acetaminophen  1,000 mg Oral QHS  . enoxaparin (LOVENOX) injection  100 mg Subcutaneous Q24H  . ferrous sulfate  325 mg Oral Daily  . hydrocortisone cream   Topical BID  . hydrOXYzine  10 mg Oral TID  . insulin aspart  0-20 Units Subcutaneous TID WC  . insulin aspart  0-5 Units Subcutaneous QHS  . insulin glargine  18 Units Subcutaneous Daily  . magnesium oxide  400 mg Oral BID  . metoprolol tartrate  12.5 mg Oral BID  . nutrition supplement (JUVEN)  1 packet Oral BID  BM  . polyethylene glycol  17 g Oral BID  . potassium chloride  40 mEq Oral Daily  . Ensure Max Protein  11 oz Oral Daily  . senna  1 tablet Oral QHS   Continuous Infusions: . sodium chloride Stopped (11/15/20 1816)    Principal Problem:   Wound infection Active Problems:   Hyperkalemia   Chronic peripheral venous hypertension with lower extremity complication   Lymphedema   Obesity   Type 2 diabetes mellitus (HCC)   HTN (hypertension)   Cellulitis   LOS: 32 days   How to contact the [facility]  Attending or Consulting provider [place] or covering provider during after hours [place], for this patient?  1. Check the care team in [facility] and look for a) attending/consulting TRH provider listed and b) the [facility] team listed 2. Log into [email or web address] and use [place]'s universal password to access. If you do not have the password, please contact the hospital operator. 3. Locate the [facility] provider you are looking for under [facility] and page to a number that you can be directly reached. 4. If you still have difficulty reaching the provider, please page the [facility] (Director on Call) for the Hospitalists listed on amion for assistance.

## 2020-11-18 NOTE — Plan of Care (Signed)
  Problem: Coping: Goal: Level of anxiety will decrease Outcome: Progressing   Problem: Pain Managment: Goal: General experience of comfort will improve Outcome: Progressing   Problem: Safety: Goal: Ability to remain free from injury will improve Outcome: Progressing   

## 2020-11-19 DIAGNOSIS — I890 Lymphedema, not elsewhere classified: Secondary | ICD-10-CM | POA: Diagnosis not present

## 2020-11-19 DIAGNOSIS — E1169 Type 2 diabetes mellitus with other specified complication: Secondary | ICD-10-CM | POA: Diagnosis not present

## 2020-11-19 LAB — GLUCOSE, CAPILLARY
Glucose-Capillary: 160 mg/dL — ABNORMAL HIGH (ref 70–99)
Glucose-Capillary: 168 mg/dL — ABNORMAL HIGH (ref 70–99)
Glucose-Capillary: 150 mg/dL — ABNORMAL HIGH (ref 70–99)
Glucose-Capillary: 153 mg/dL — ABNORMAL HIGH (ref 70–99)

## 2020-11-19 MED ORDER — SENNA 8.6 MG PO TABS
1.0000 | ORAL_TABLET | Freq: Two times a day (BID) | ORAL | Status: DC
  Administered 2020-11-19 – 2020-12-26 (×75): 8.6 mg via ORAL
  Filled 2020-11-19 (×78): qty 1

## 2020-11-19 NOTE — Progress Notes (Signed)
Physical Therapy Treatment Patient Details Name: [name] MRN: [id number] DOB: 1965-08-02 Today's Date: 11/19/2020    History of Present Illness Pt admitted from home with multiple LE wounds, and FTT and unable to perform basic self care.  Pt reports she stayed in lift chair getting up only twice a day for BM or getting food.  Pt utilizing Rollator around home but pivoted to it from lift chair and wheeled around in seated position.  Pt states she put pads and diapers in chair so she did not have to get up to urinate.  Pt with hx of DM and lymphadema (has been unable to use her pressure sleeves so lymphadema has significantly worsened since last December).    PT Comments    Patient  Worked on bed tilt today. Tolerated  From 35-45 degrees x 20 minutes. 45* x 3 minutes, limited due to leg spasms. Placed in bed/chair position x 10 minutes with knees flexed at ~ 45* and HOB 57*. Continue tilt to stand next visit. Patient perfroming  Self  Sliding toward HOB when bed flat and tilted in Trendelenburg.  Follow Up Recommendations  SNF     Equipment Recommendations  Wheelchair (measurements PT);Wheelchair cushion (measurements PT)    Recommendations for Other Services       Precautions / Restrictions Precautions Precautions: Fall Precaution Comments: posterior LLE wounds, place bed pad  to cushion when maxiskying.    Mobility  Bed Mobility               General bed mobility comments: with bed in chair paosition at 56*, patient pulled forward a few inches  away from the bed for a few seconds Start Time: 1210 Angle: 45 degrees (max tilt) Total Minutes in Angle: 3 minutes  Transfers                    Ambulation/Gait                 Stairs             Wheelchair Mobility    Modified Rankin (Stroke Patients Only)       Balance                                            Cognition Arousal/Alertness: Awake/alert                                             Exercises      General Comments        Pertinent Vitals/Pain Faces Pain Scale: Hurts even more Pain Location: hips, knees, Pain Descriptors / Indicators: Grimacing Pain Intervention(s): Premedicated before session;Monitored during session;Heat applied    Home Living                      Prior Function            PT Goals (current goals can now be found in the care plan section) Progress towards PT goals: Progressing toward goals    Frequency    Min 2X/week      PT Plan Current plan remains appropriate    Co-evaluation              AM-PAC PT "6  Clicks" Mobility   Outcome Measure  Help needed turning from your back to your side while in a flat bed without using bedrails?: Total Help needed moving from lying on your back to sitting on the side of a flat bed without using bedrails?: Total Help needed moving to and from a bed to a chair (including a wheelchair)?: Total Help needed standing up from a chair using your arms (e.g., wheelchair or bedside chair)?: Total Help needed to walk in hospital room?: Total Help needed climbing 3-5 steps with a railing? : Total 6 Click Score: 6    End of Session   Activity Tolerance: Patient tolerated treatment well Patient left: in bed;with call bell/phone within reach Nurse Communication: Mobility status;Need for lift equipment PT Visit Diagnosis: Muscle weakness (generalized) (M62.81);Difficulty in walking, not elsewhere classified (R26.2);Pain;Adult, failure to thrive (R62.7) Pain - Right/Left: Right Pain - part of body: Knee;Leg;Hip     Time: 1252-1350 PT Time Calculation (min) (ACUTE ONLY): 58 min  Charges:  $Therapeutic Activity: 53-67 mins                     [name] PT Acute Rehabilitation Services Pager [phone] Office [phone]    [name] 11/19/2020, 2:04 PM

## 2020-11-19 NOTE — Progress Notes (Signed)
PROGRESS NOTE  [name] LYY:[id number] DOB: April 23, 1966 DOA: 10/10/2020 PCP: Fanny Bien, MD  Brief History   55 year old woman PMH morbid obesity treated for skin breakdown.  Pending SNF placement.  A & P  Chronic bilateral lower extremity lymphedema -- Stable, continue lymphedema pumps, PT  Diabetes mellitus type 2 -- CBG stable.  Continue Lantus and sliding scale insulin.  Essential hypertension --Diuretics discontinued.  Stable, continue metoprolol.  Morbid obesity --Not interested in bariatric surgery  Disposition --Lives alone, unable to care for self, nonambulatory.  Plan for SNF.   Disposition Plan:  Discussion:   Status is: Inpatient  Remains inpatient appropriate because:Unsafe d/c plan   Dispo: The patient is from: Home              Anticipated d/c is to: SNF              Patient currently is not medically stable to d/c.   Difficult to place patient No  DVT prophylaxis:    Code Status: Full Code Level of care: Med-Surg Family Communication: none  Murray Hodgkins, MD  [facility] Direct contact: see [email or web address] (further directions at bottom of note if needed) 7PM-7AM contact night coverage as at bottom of note 11/19/2020, 2:33 PM  LOS: 33 days   Interval History/Subjective  CC: f/u obesity  Feel ok today  Objective   Vitals:  Vitals:   11/19/20 0505 11/19/20 1403  BP: 125/75 138/71  Pulse: 94 90  Resp: 18 18  Temp: 97.9 F (36.6 C) 98.6 F (37 C)  SpO2: 93% 95%    Exam:  Constitutional:   . Appears calm and comfortable Respiratory:  . CTA bilaterally, no w/r/r.  . Respiratory effort normal.  Cardiovascular:  . RRR, no m/r/g Psychiatric:  . Mental status o Mood, affect appropriate  I have personally reviewed the following:   Today's Data  . CBG stable  Scheduled Meds: . (feeding supplement) PROSource Plus  30 mL Oral Daily  . acetaminophen  1,000 mg Oral QHS  . enoxaparin (LOVENOX) injection  100 mg  Subcutaneous Q24H  . ferrous sulfate  325 mg Oral Daily  . hydrocortisone cream   Topical BID  . hydrOXYzine  10 mg Oral TID  . insulin aspart  0-20 Units Subcutaneous TID WC  . insulin aspart  0-5 Units Subcutaneous QHS  . insulin glargine  18 Units Subcutaneous Daily  . magnesium oxide  400 mg Oral BID  . metoprolol tartrate  12.5 mg Oral BID  . nutrition supplement (JUVEN)  1 packet Oral BID BM  . potassium chloride  40 mEq Oral Daily  . Ensure Max Protein  11 oz Oral Daily  . senna  1 tablet Oral BID   Continuous Infusions: . sodium chloride Stopped (11/15/20 1816)    Principal Problem:   Wound infection Active Problems:   Hyperkalemia   Chronic peripheral venous hypertension with lower extremity complication   Lymphedema   Obesity   Type 2 diabetes mellitus (HCC)   HTN (hypertension)   Cellulitis   LOS: 33 days   How to contact the [facility] Attending or Consulting provider [place] or covering provider during after hours [place], for this patient?  1. Check the care team in [facility] and look for a) attending/consulting TRH provider listed and b) the [facility] team listed 2. Log into [email or web address] and use [place]'s universal password to access. If you do not have the password, please contact the hospital operator. 3. Locate  the [facility] provider you are looking for under [facility] and page to a number that you can be directly reached. 4. If you still have difficulty reaching the provider, please page the [facility] (Director on Call) for the Hospitalists listed on amion for assistance.

## 2020-11-19 NOTE — Plan of Care (Signed)

## 2020-11-20 DIAGNOSIS — T148XXA Other injury of unspecified body region, initial encounter: Secondary | ICD-10-CM | POA: Diagnosis not present

## 2020-11-20 DIAGNOSIS — I890 Lymphedema, not elsewhere classified: Secondary | ICD-10-CM | POA: Diagnosis not present

## 2020-11-20 DIAGNOSIS — L089 Local infection of the skin and subcutaneous tissue, unspecified: Secondary | ICD-10-CM | POA: Diagnosis not present

## 2020-11-20 DIAGNOSIS — I87399 Chronic venous hypertension (idiopathic) with other complications of unspecified lower extremity: Secondary | ICD-10-CM | POA: Diagnosis not present

## 2020-11-20 LAB — GLUCOSE, CAPILLARY
Glucose-Capillary: 147 mg/dL — ABNORMAL HIGH (ref 70–99)
Glucose-Capillary: 186 mg/dL — ABNORMAL HIGH (ref 70–99)
Glucose-Capillary: 154 mg/dL — ABNORMAL HIGH (ref 70–99)
Glucose-Capillary: 145 mg/dL — ABNORMAL HIGH (ref 70–99)

## 2020-11-20 MED ORDER — METHOCARBAMOL 500 MG PO TABS
1000.0000 mg | ORAL_TABLET | Freq: Four times a day (QID) | ORAL | Status: DC | PRN
  Administered 2020-11-20 – 2020-12-27 (×108): 1000 mg via ORAL
  Filled 2020-11-20 (×113): qty 2

## 2020-11-20 MED ORDER — ENSURE MAX PROTEIN PO LIQD
11.0000 [oz_av] | Freq: Two times a day (BID) | ORAL | Status: DC
  Administered 2020-11-20 – 2020-12-27 (×73): 11 [oz_av] via ORAL
  Filled 2020-11-20 (×77): qty 330

## 2020-11-20 NOTE — Progress Notes (Signed)
Occupational Therapy Treatment Patient Details Name: [name] MRN: [id number] DOB: 1966/03/09 Today's Date: 11/20/2020    History of present illness Pt admitted from home with multiple LE wounds, and FTT and unable to perform basic self care.  Pt reports she stayed in lift chair getting up only twice a day for BM or getting food.  Pt utilizing Rollator around home but pivoted to it from lift chair and wheeled around in seated position.  Pt states she put pads and diapers in chair so she did not have to get up to urinate.  Pt with hx of DM and lymphadema (has been unable to use her pressure sleeves so lymphadema has significantly worsened since last December).   OT comments  Treatment focused on rolling to reduce burden of care during ADL bed level tasks as well as to promote hip mobility - needed for rolling and pericare. Max x 1 to roll to there right and max x 2 to roll to the left. Patient had limited tolerance for trying to extend knees and uncross ankles or advancing hip flexion due to hip pain. Patient calls the pain "spasms." When asked to show therapist where the pain was patient palpated inguinal area. When asked why she calls the pain a "spasm" patient reports "it's like a catch. It won't move." Patient's heels touching each other in supine position. When asked to move legs outward - patient able to move feet with some active assist approx 2-3 inches apart. But no further. Patient's inability to tolerate hip movement due to pain and stiffness significantly impairs her ability to assist with rolling and makes pericare quit difficult. Discussion with POC team in regards to  how to advance patient have been ongoing. Patient has not met made any functional gains - despite small gains with activity tolerance, seated positioning and upper body strengthening. Will cont to follow acutely for now.    Follow Up Recommendations  SNF    Equipment Recommendations  None recommended by OT     Recommendations for Other Services      Precautions / Restrictions Precautions Precautions: Fall Precaution Comments: posterior LLE wounds, place bed pad  to cushion when maxiskying. Restrictions Weight Bearing Restrictions: No       Mobility Bed Mobility   Bed Mobility: Rolling           General bed mobility comments: Worked on rolling left and right again today. Patient needs +2 assistance to be pulled towards left rail prior to attempt to roll. Patient able to initiate roll to the right with upper body - needs max assist x to roll lower body over. Patient able to maintain position holding onto bed rail. Once again worked on extending knees and uncrossing ankles in order to advance hip flexion. However, patient has poor tolerance for LE movement due to hip pain and "spasms." Max x 2 to roll to the left. Increased pain and poor tolerance of movement of RLE.    Transfers                      Balance                                           ADL either performed or assessed with clinical judgement   ADL  Vision Patient Visual Report: No change from baseline     Perception     Praxis      Cognition Arousal/Alertness: Awake/alert Behavior During Therapy: WFL for tasks assessed/performed Overall Cognitive Status: Within Functional Limits for tasks assessed                                          Exercises     Shoulder Instructions       General Comments      Pertinent Vitals/ Pain       Pain Assessment: 0-10 Pain Score: 10-Worst pain ever Pain Location: hips (with moving LEs) Pain Descriptors / Indicators: Grimacing;Guarding;Crying;Sharp;Shooting Pain Intervention(s): Limited activity within patient's tolerance;Premedicated before session  Home Living                                          Prior Functioning/Environment               Frequency  Min 2X/week        Progress Toward Goals  OT Goals(current goals can now be found in the care plan section)  Progress towards OT goals: OT to reassess next treatment  Acute Rehab OT Goals Patient Stated Goal: manage myself and go home OT Goal Formulation: With patient Time For Goal Achievement: 11/27/20 Potential to Achieve Goals: [place] Discharge plan remains appropriate    Co-evaluation                 AM-PAC OT "6 Clicks" Daily Activity     Outcome Measure   Help from another person eating meals?: None Help from another person taking care of personal grooming?: A Little Help from another person toileting, which includes using toliet, bedpan, or urinal?: Total Help from another person bathing (including washing, rinsing, drying)?: A Lot Help from another person to put on and taking off regular upper body clothing?: A Lot Help from another person to put on and taking off regular lower body clothing?: Total 6 Click Score: 13    End of Session    OT Visit Diagnosis: Other abnormalities of gait and mobility (R26.89);Muscle weakness (generalized) (M62.81);Pain   Activity Tolerance Patient limited by pain   Patient Left in bed   Nurse Communication  (premedicate for therapy)        Time: 7915-0569 OT Time Calculation (min): 29 min  Charges: OT General Charges $OT Visit: 1 Visit OT Treatments $Therapeutic Activity: 23-37 mins  Cintia Gleed, OTR/[place]  Office [phone] Pager: [place] 11/20/2020, 12:19 PM

## 2020-11-20 NOTE — Progress Notes (Signed)
Patient ID: ZAIDY ABSHER, female   DOB: 21-May-1966, 55 y.o.   MRN: [id number]  PROGRESS NOTE    LAILIE SMEAD  VQM:[id number] DOB: 12/09/1965 DOA: 10/10/2020 PCP: Fanny Bien, MD   Brief Narrative:  55 year old female with history of morbid obesity, diabetes mellitus type 2, essential hypertension, chronic lymphedema admitted and treated for skin breakdown.  Patient is currently nonambulatory, lives alone and unable to care for self.  Pending SNF placement.  Medically stable for discharge.  Assessment & Plan:   Chronic bilateral lower extremity lymphedema -- Stable, continue lymphedema pumps -Continue PT/OT  Diabetes mellitus type 2 -- CBG stable.  Continue Lantus and sliding scale insulin.  Essential hypertension --Diuretics discontinued.  Stable, continue metoprolol.  Morbid obesity --Not interested in bariatric surgery  Disposition --Lives alone, unable to care for self, nonambulatory.  Plan for SNF.  Social worker following.  DVT prophylaxis: Lovenox Code Status: Full Family Communication: None at bedside Disposition Plan: Status is: Inpatient  Remains inpatient appropriate because:Unsafe d/c plan   Dispo: The patient is from: Home              Anticipated d/c is to: SNF              Patient currently is medically stable to d/c.   Difficult to place patient Yes   Consultants: None  Procedures: None  Antimicrobials:  Anti-infectives (From admission, onward)   Start     Dose/Rate Route Frequency Ordered Stop   10/11/20 1000  vancomycin (VANCOREADY) IVPB 2000 mg/400 mL  Status:  Discontinued        2,000 mg 200 mL/hr over 120 Minutes Intravenous Every 12 hours 10/10/20 2047 10/11/20 1140   10/11/20 0100  ceFEPIme (MAXIPIME) 2 g in sodium chloride 0.9 % 100 mL IVPB  Status:  Discontinued        2 g 200 mL/hr over 30 Minutes Intravenous Every 8 hours 10/10/20 2001 10/11/20 1140   10/10/20 2100  vancomycin (VANCOCIN) 2,500 mg in sodium  chloride 0.9 % 500 mL IVPB        2,500 mg 250 mL/hr over 120 Minutes Intravenous  Once 10/10/20 2001 10/11/20 0135   10/10/20 1545  ceFAZolin (ANCEF) IVPB 2g/100 mL premix        2 g 200 mL/hr over 30 Minutes Intravenous  Once 10/10/20 1530 10/10/20 1723       Subjective: Patient seen and examined at bedside.  Denies any new complaints.  Had bowel movement yesterday.  No overnight fever, vomiting or worsening shortness of breath reported.  Objective: Vitals:   11/19/20 0505 11/19/20 1403 11/19/20 2117 11/20/20 0441  BP: 125/75 138/71 116/64   Pulse: 94 90 98   Resp: 18 18 16    Temp: 97.9 F (36.6 C) 98.6 F (37 C) 99.3 F (37.4 C)   TempSrc: Oral Oral Oral   SpO2: 93% 95% 94%   Weight:    (!) 218.8 kg  Height:        Intake/Output Summary (Last 24 hours) at 11/20/2020 1016 Last data filed at 11/20/2020 0821 Gross per 24 hour  Intake 1040 ml  Output 2000 ml  Net -960 ml   Filed Weights   11/18/20 0438 11/19/20 0455 11/20/20 0441  Weight: (!) 219.6 kg (!) 219 kg (!) 218.8 kg    Examination:  General exam: Appears calm and comfortable.  Currently on room air. Respiratory system: Bilateral decreased breath sounds at bases with scattered crackles Cardiovascular system: S1 & S2  heard, Rate controlled Gastrointestinal system: Abdomen is morbidly obese, nondistended, soft and nontender. Normal bowel sounds heard. Extremities: Bilateral lower extremity edema with chronic skin changes present.    Data Reviewed: I have personally reviewed following labs and imaging studies  CBC: No results for input(s): WBC, NEUTROABS, HGB, HCT, MCV, PLT in the last 168 hours. Basic Metabolic Panel: No results for input(s): NA, K, CL, CO2, GLUCOSE, BUN, CREATININE, CALCIUM, MG, PHOS in the last 168 hours. GFR: Estimated Creatinine Clearance: 161 mL/min (by C-G formula based on SCr of 0.83 mg/dL). Liver Function Tests: No results for input(s): AST, ALT, ALKPHOS, BILITOT, PROT, ALBUMIN in  the last 168 hours. No results for input(s): LIPASE, AMYLASE in the last 168 hours. No results for input(s): AMMONIA in the last 168 hours. Coagulation Profile: No results for input(s): INR, PROTIME in the last 168 hours. Cardiac Enzymes: No results for input(s): CKTOTAL, CKMB, CKMBINDEX, TROPONINI in the last 168 hours. BNP (last 3 results) No results for input(s): PROBNP in the last 8760 hours. HbA1C: No results for input(s): HGBA1C in the last 72 hours. CBG: Recent Labs  Lab 11/19/20 0735 11/19/20 1140 11/19/20 1631 11/19/20 2118 11/20/20 0807  GLUCAP 160* 168* 150* 153* 147*   Lipid Profile: No results for input(s): CHOL, HDL, LDLCALC, TRIG, CHOLHDL, LDLDIRECT in the last 72 hours. Thyroid Function Tests: No results for input(s): TSH, T4TOTAL, FREET4, T3FREE, THYROIDAB in the last 72 hours. Anemia Panel: No results for input(s): VITAMINB12, FOLATE, FERRITIN, TIBC, IRON, RETICCTPCT in the last 72 hours. Sepsis Labs: No results for input(s): PROCALCITON, LATICACIDVEN in the last 168 hours.  No results found for this or any previous visit (from the past 240 hour(s)).       Radiology Studies: No results found.      Scheduled Meds: . (feeding supplement) PROSource Plus  30 mL Oral Daily  . acetaminophen  1,000 mg Oral QHS  . enoxaparin (LOVENOX) injection  100 mg Subcutaneous Q24H  . ferrous sulfate  325 mg Oral Daily  . hydrocortisone cream   Topical BID  . hydrOXYzine  10 mg Oral TID  . insulin aspart  0-20 Units Subcutaneous TID WC  . insulin aspart  0-5 Units Subcutaneous QHS  . insulin glargine  18 Units Subcutaneous Daily  . magnesium oxide  400 mg Oral BID  . metoprolol tartrate  12.5 mg Oral BID  . nutrition supplement (JUVEN)  1 packet Oral BID BM  . potassium chloride  40 mEq Oral Daily  . Ensure Max Protein  11 oz Oral Daily  . senna  1 tablet Oral BID   Continuous Infusions: . sodium chloride Stopped (11/15/20 1816)          Aline August, MD [facility] 11/20/2020, 10:16 AM

## 2020-11-20 NOTE — Plan of Care (Signed)

## 2020-11-20 NOTE — Progress Notes (Signed)
Nutrition Follow-up  DOCUMENTATION CODES:   Morbid obesity  INTERVENTION:  - continue Juven BID and 30 ml Prosource Plus once/day.  - will increase Ensure Max from once/day to BID.    NUTRITION DIAGNOSIS:   Increased nutrient needs related to wound healing as evidenced by estimated needs. -ongoing  GOAL:   Patient will meet greater than or equal to 90% of their needs -unmet with current diet order  MONITOR:   PO intake,Supplement acceptance,Labs,Weight trends,Skin  ASSESSMENT:   55 year old female with medical history of type 2 DM, morbid obesity, chronic lymphedema, chronic non-healing ulcers on both legs and calves and L upper thigh. She is followed by wound clinic and followed by [facility] 3 times per week until recently d/t insurance company no longer covering this service. She presented to the ED d/t worsening or wounds which have had increased drainage and pain. She is unable to change the dressings herself. At home, she did not walk but would move around the house in a rolling chair 1-2 times/day. She lives alone. Currently pending d/c to SNF but is a difficult placement d/t weight.  Diet changed from Heart Healthy/Carb Modified to FLD on 5/29. Unable to locate documentation of rationale of diet change/downgrade.   Recent meal completion: 5/29- 75% of lunch and 70% of dinner (total of 390 kcal and 10 grams protein) 5/30- 100% of lunch (511 kcal and 20 grams protein)  No other meal completions documented since FLD order placed.   She has been accepting Prosource, Ensure Max, and Juven 100% of the time offered. This provides 440 kcal and 50 grams protein/day from oral nutrition supplements.   Recent weights: 5/26- 491 lb 5/27- 480 lb 5/28- 489 lb 5/30- 484 lb 5/31- 483 lb 6/1- 484 lb     Labs reviewed; CBGs: 147 and 186 mg/dl. Medications reviewed; 325 mg ferrous sulfate/day, sliding scale novolog, 18 units lantus/day, 400 mg mag-ox BID, 40 mEq  Klor-Con/day, 1 tablet senokot BID.     Diet Order:   Diet Order            Diet full liquid Room service appropriate? Yes; Fluid consistency: Thin  Diet effective now                 EDUCATION NEEDS:   Education needs have been addressed  Skin:  Skin Assessment: Skin Integrity Issues: Skin Integrity Issues:: Other (Comment) Other: L thigh and L tibia pressure injuries  Last BM:  5/30  Height:   Ht Readings from Last 1 Encounters:  11/15/20 5\' 10"  (1.778 m)    Weight:   Wt Readings from Last 1 Encounters:  11/20/20 (!) 218.8 kg    Estimated Nutritional Needs:  Kcal:  2000-2200 kcal Protein:  120-135 grams Fluid:  >/= 2 L/day     Jarome Matin, MS, RD, LDN, CNSC Inpatient Clinical Dietitian RD pager # available in [place]  After hours/weekend pager # available in [facility]

## 2020-11-20 NOTE — Progress Notes (Signed)
Physical Therapy Treatment Patient Details Name: [name] MRN: [id number] DOB: 05-22-1966 Today's Date: 11/20/2020    History of Present Illness Pt admitted from home with multiple LE wounds, and FTT and unable to perform basic self care.  Pt reports she stayed in lift chair getting up only twice a day for BM or getting food.  Pt utilizing Rollator around home but pivoted to it from lift chair and wheeled around in seated position.  Pt states she put pads and diapers in chair so she did not have to get up to urinate.  Pt with hx of DM and lymphadema (has been unable to use her pressure sleeves so lymphadema has significantly worsened since last December).    PT Comments    Tilt in bed performed tolerated max of 41 degrees for 4 minutes. Total tilt time 20 minutes from 30 to 41*. Patient then placed bed in chair position. Patient's gains are limited in standing as patient reports increased pain during prolonged weight bearing . PTA . Limited time in standing of a few seconds per patient. In bed and in tilted position, Patient's  legs are externally rotated and lack full knee extension. Patient has  Almost no internal rotation of the hips when attempting AAROM/ positioning in bed. Continue PT, collaborate with care team for when max benefit is achieved for PT.   Follow Up Recommendations  SNF     Equipment Recommendations  Wheelchair (measurements PT);Wheelchair cushion (measurements PT)    Recommendations for Other Services       Precautions / Restrictions Precautions Precautions: Fall Precaution Comments: posterior LLE wounds, place bed pad  to cushion when maxiskying. Restrictions Weight Bearing Restrictions: No    Mobility  Bed Mobility   Bed Mobility: Rolling           General bed mobility comments: Worked on rolling left and right again today. Patient needs +2 assistance to be pulled towards left rail prior to attempt to roll. Patient able to initiate roll to the  right with upper body - needs max assist x to roll lower body over. Patient able to maintain position holding onto bed rail. Once again worked on extending knees and uncrossing ankles in order to advance hip flexion. However, patient has poor tolerance for LE movement due to hip pain and "spasms." Max x 2 to roll to the left. Increased pain and poor tolerance of movement of RLE. Start Time: 44 Angle: 41 degrees Total Minutes in Angle: 4 minutes  Transfers                    Ambulation/Gait                 Stairs             Wheelchair Mobility    Modified Rankin (Stroke Patients Only)       Balance                                            Cognition Arousal/Alertness: Awake/alert Behavior During Therapy: WFL for tasks assessed/performed Overall Cognitive Status: Within Functional Limits for tasks assessed                                 General Comments: able to work through tilting  when pain  meds were due      Exercises      General Comments        Pertinent Vitals/Pain Pain Assessment: 0-10 Pain Score: 10-Worst pain ever Faces Pain Scale: Hurts whole lot Pain Location: hips and knees Pain Descriptors / Indicators: Aching;Discomfort;Moaning Pain Intervention(s): Monitored during session;RN gave pain meds during session    Home Living                      Prior Function            PT Goals (current goals can now be found in the care plan section) Acute Rehab PT Goals Patient Stated Goal: manage myself and go home Progress towards PT goals: Progressing toward goals    Frequency    Min 2X/week      PT Plan Current plan remains appropriate    Co-evaluation              AM-PAC PT "6 Clicks" Mobility   Outcome Measure  Help needed turning from your back to your side while in a flat bed without using bedrails?: Total Help needed moving from lying on your back to sitting on the  side of a flat bed without using bedrails?: Total Help needed moving to and from a bed to a chair (including a wheelchair)?: Total Help needed standing up from a chair using your arms (e.g., wheelchair or bedside chair)?: Total Help needed to walk in hospital room?: Total Help needed climbing 3-5 steps with a railing? : Total 6 Click Score: 6    End of Session   Activity Tolerance: Patient tolerated treatment well Patient left: in bed;with call bell/phone within reach Nurse Communication: Mobility status;Need for lift equipment PT Visit Diagnosis: Muscle weakness (generalized) (M62.81);Difficulty in walking, not elsewhere classified (R26.2);Pain;Adult, failure to thrive (R62.7) Pain - Right/Left: Right Pain - part of body: Knee;Leg;Hip     Time: 1400-1443 PT Time Calculation (min) (ACUTE ONLY): 43 min  Charges:  $Therapeutic Activity: 38-52 mins                     [name] PT Acute Rehabilitation Services Pager [phone] Office [phone]    [name] 11/20/2020, 3:06 PM

## 2020-11-20 NOTE — Consult Note (Signed)
Reason for Consult:obesity, leg ulcer , mobility problems Referring Physician: Aline August MD   [name] is an 55 y.o. female.  HPI: 55 year old female with severe lymphedema, depression, morbid obesity, chronic left popliteal wound treated since 2018 by Dr. Dellia Nims is seen with problems with mobility.  Prior to admission which was mid April 2022 she had been at home using her lift chair to stand up for possibly 4 to 10 seconds and would pivot to her extrawide rolling walker with a reverse seat sitting in the chair and then would propel her self to the kitchen.  She has been sleeping in her lift chair since December of this year.  Severe bilateral lymphedema/venous stasis problems with brawny dark discoloration of both legs from the knees down.  She has been in the hospital in a lift bed with her legs elevated and has been getting improvement in her pressure wound.  She normally puts towels when she uses her reverse walker to set to help with the pressure.  She does not have a special cushion and she states she ordered the Rollator reverse walker on [place] and had it delivered.  Patient goes from lift chair to her Rollator goes to the kitchen does her cooking and sitting position and then goes back and sleeps in her lift chair.  Several years ago she is to do some pool therapy which she enjoyed but mobility is decreased.  Currently she has been able to going to flexible bed to about 50 degrees.  She has problems with decreased hip and knee mobility and has increased knee pain when she is in the upright position in the standing bed.  Patient has diabetes.  Her principal goal is to be independent and go back to living at her home.  Her long-term goal is to gradually lose weight and get back to doing water aerobics which she has not done for many years. Patient had home health nurse coming out to her house last week doing dressing changes and this was cut back to once a week with worsening over  wound increased pain requiring admission.  She has now been here since October 10, 2020. Past Medical History:  Diagnosis Date  . Arthritis    knees, hips, tx with otc med  . Complication of anesthesia    pt states numb from waist down in 2013  still has numbness in feet  . Depression    History in 2004, no current prob, no meds  . Diabetes mellitus without complication ([place])   . Headache    pt states she has sinus problems and sinus headaches  . Lymphedema   . Seasonal allergies   . Wounds, multiple     Past Surgical History:  Procedure Laterality Date  . arthroscopic knee  1998   right knee  . Excision of 4 areas of hidradenitis in the right axilla   1986 x 04/25/2004   x 2  . OVARIAN CYST REMOVAL  08/11/2011   Procedure: OVARIAN CYSTECTOMY;  Surgeon: Marvene Staff, MD;  Location: [facility];  Service: Gynecology;  Laterality: Left;  . TONSILLECTOMY     as 55 yrs old  . WISDOM TOOTH EXTRACTION      History reviewed. No pertinent family history.  Social History:  reports that she quit smoking about 2 years ago. Her smoking use included cigars. She quit after 15.00 years of use. She has never used smokeless tobacco. She reports previous alcohol use. She reports that she  does not use drugs.  Allergies: No Known Allergies  Medications: I have reviewed the patient's current medications.  Results for orders placed or performed during the hospital encounter of 10/10/20 (from the past 48 hour(s))  Glucose, capillary     Status: Abnormal   Collection Time: 11/19/20  7:35 AM  Result Value Ref Range   Glucose-Capillary 160 (H) 70 - 99 mg/dL    Comment: Glucose reference range applies only to samples taken after fasting for at least 8 hours.  Glucose, capillary     Status: Abnormal   Collection Time: 11/19/20 11:40 AM  Result Value Ref Range   Glucose-Capillary 168 (H) 70 - 99 mg/dL    Comment: Glucose reference range applies only to samples taken after fasting for at least 8  hours.  Glucose, capillary     Status: Abnormal   Collection Time: 11/19/20  4:31 PM  Result Value Ref Range   Glucose-Capillary 150 (H) 70 - 99 mg/dL    Comment: Glucose reference range applies only to samples taken after fasting for at least 8 hours.  Glucose, capillary     Status: Abnormal   Collection Time: 11/19/20  9:18 PM  Result Value Ref Range   Glucose-Capillary 153 (H) 70 - 99 mg/dL    Comment: Glucose reference range applies only to samples taken after fasting for at least 8 hours.  Glucose, capillary     Status: Abnormal   Collection Time: 11/20/20  8:07 AM  Result Value Ref Range   Glucose-Capillary 147 (H) 70 - 99 mg/dL    Comment: Glucose reference range applies only to samples taken after fasting for at least 8 hours.  Glucose, capillary     Status: Abnormal   Collection Time: 11/20/20 11:44 AM  Result Value Ref Range   Glucose-Capillary 186 (H) 70 - 99 mg/dL    Comment: Glucose reference range applies only to samples taken after fasting for at least 8 hours.  Glucose, capillary     Status: Abnormal   Collection Time: 11/20/20  4:34 PM  Result Value Ref Range   Glucose-Capillary 154 (H) 70 - 99 mg/dL    Comment: Glucose reference range applies only to samples taken after fasting for at least 8 hours.  Glucose, capillary     Status: Abnormal   Collection Time: 11/20/20  9:41 PM  Result Value Ref Range   Glucose-Capillary 145 (H) 70 - 99 mg/dL    Comment: Glucose reference range applies only to samples taken after fasting for at least 8 hours.    No results found.  ROS all the systems updated.  Of note is chronic wound left popliteal region since 2018.  Hypertension diabetes lymphedema.  Morbid obesity. Blood pressure 115/75, pulse 98, temperature 98.5 F (36.9 C), temperature source Oral, resp. rate 16, height 5\' 10"  (1.778 m), weight (!) 218.8 kg, SpO2 95 %. Physical Exam Constitutional:      General: She is not in acute distress.    Appearance: She is obese.   HENT:     Right Ear: External ear normal.     Left Ear: External ear normal.     Mouth/Throat:     Mouth: Mucous membranes are moist.  Eyes:     Extraocular Movements: Extraocular movements intact.  Cardiovascular:     Rate and Rhythm: Normal rate.  Pulmonary:     Effort: Pulmonary effort is normal.     Breath sounds: No wheezing.  Abdominal:     Comments: obese  Musculoskeletal:     Cervical back: Normal range of motion.  Neurological:     Mental Status: She is alert.  Psychiatric:        Mood and Affect: Mood normal.        Behavior: Behavior normal.        Thought Content: Thought content normal.   Left popliteal wound pressure sore with multiple description described as pink.  Bilateral lower extremity brawny edema without anterior tibial acute wounds.  Old scarring left pretibial region from previous ulcer.  Assessment/Plan:  Would work on trying to get her on telemetry bed up direct position for extremely short periods of time possibly 5 to 10 seconds maximum.  Would not volts her strap her knee straight since she undoubtedly has some significant knee arthritis and will have increased pain after strapped out in full extension.  When she was at home she would just stand for a few seconds from her lift chair and then pivot to a Rollator walker with reverse seat and sit down.  Discussed patient I recommend she have someone bring her Rollator to the hospital.  Would take the yellow socks she is wearing flip them inside out so when she stands they are not sticky and she can spin to sit down on the Rollator walker.  She did this daily several times but has not done it since April.  Goal would be to get her back where she could at least be in an upright position as she pivots to her walker so she could sit down and then propel her self with her feet.  When she is able to pivot you could take the yellow socks and then flipped them back inside out so that they are sticky but currently  with the sticky socks she will not be able to spin and pivot like she does when she is at home standing on a rug.  Would recommend not trying to make a goal of being upright for extended length of time since it is something she has not been able to do for months/years.  Goal be able to get her from a lift chair type position to the rolling walker and then have her self propel out the room and into the hall and then back and then be able to transfer back to bed without falling.  Long discussion with patient that this is unsuccessful then skilled facility long-term would be required and obviously her goal is to be back home.  Discussed with her that hip injection/knee injection etc. is not going to fix her problem with stiff painful joints and disuse atrophy of lower extremities, complicated by problems with obesity and increased load on her lower extremities.  Patient is able to transfer then she can go back home with 2 times a week home health nurse dressing changes.  2 times a week dressing changes certainly is more cost efficient than repeat admissions in the hospital.  OT/PT staff may assist patient looking at options for ordering a cushion that would fit rolling walker once it arrives that she could order on [place] and use in her chair to help decrease the stress on the left popliteal wound when she sits in a Rollator while she cooks etc.  Thank you for the  opportunity to share in her  care.  My cell phone [place] [phone]  Marybelle Killings 11/20/2020, 10:15 PM

## 2020-11-21 DIAGNOSIS — I890 Lymphedema, not elsewhere classified: Secondary | ICD-10-CM | POA: Diagnosis not present

## 2020-11-21 LAB — GLUCOSE, CAPILLARY
Glucose-Capillary: 171 mg/dL — ABNORMAL HIGH (ref 70–99)
Glucose-Capillary: 172 mg/dL — ABNORMAL HIGH (ref 70–99)
Glucose-Capillary: 153 mg/dL — ABNORMAL HIGH (ref 70–99)
Glucose-Capillary: 145 mg/dL — ABNORMAL HIGH (ref 70–99)

## 2020-11-21 NOTE — Progress Notes (Signed)
Patient ID: [name], female   DOB: January 21, 1966, 55 y.o.   MRN: [id number]  PROGRESS NOTE    [name]  WCB:[id number] DOB: 22-Mar-1966 DOA: 10/10/2020 PCP: Fanny Bien, MD   Brief Narrative:  55 year old female with history of morbid obesity, diabetes mellitus type 2, essential hypertension, chronic lymphedema admitted and treated for skin breakdown.  Patient is currently nonambulatory, lives alone and unable to care for self.  Pending SNF placement.  Medically stable for discharge.  Assessment & Plan:   Chronic bilateral lower extremity lymphedema -- Stable, continue lymphedema pumps -Continue PT/OT  Diabetes mellitus type 2 -- CBG stable.  Continue Lantus and sliding scale insulin.  Essential hypertension --Diuretics discontinued.  Stable, continue metoprolol.  Morbid obesity --Not interested in bariatric surgery  Disposition --Lives alone, unable to care for self, nonambulatory.  Plan for SNF.  Social worker following.  Ambulatory dysfunction -Orthopedics evaluation appreciated.  DVT prophylaxis: Lovenox Code Status: Full Family Communication: None at bedside Disposition Plan: Status is: Inpatient  Remains inpatient appropriate because:Unsafe d/c plan   Dispo: The patient is from: Home              Anticipated d/c is to: SNF              Patient currently is medically stable to d/c.   Difficult to place patient Yes   Consultants: Orthopedics  Procedures: None  Antimicrobials:  Anti-infectives (From admission, onward)   Start     Dose/Rate Route Frequency Ordered Stop   10/11/20 1000  vancomycin (VANCOREADY) IVPB 2000 mg/400 mL  Status:  Discontinued        2,000 mg 200 mL/hr over 120 Minutes Intravenous Every 12 hours 10/10/20 2047 10/11/20 1140   10/11/20 0100  ceFEPIme (MAXIPIME) 2 g in sodium chloride 0.9 % 100 mL IVPB  Status:  Discontinued        2 g 200 mL/hr over 30 Minutes Intravenous Every 8 hours 10/10/20 2001 10/11/20  1140   10/10/20 2100  vancomycin (VANCOCIN) 2,500 mg in sodium chloride 0.9 % 500 mL IVPB        2,500 mg 250 mL/hr over 120 Minutes Intravenous  Once 10/10/20 2001 10/11/20 0135   10/10/20 1545  ceFAZolin (ANCEF) IVPB 2g/100 mL premix        2 g 200 mL/hr over 30 Minutes Intravenous  Once 10/10/20 1530 10/10/20 1723       Subjective: Patient seen and examined at bedside.  Poor historian. Sleepy, wakes up only very slightly, does not participate in conversation much.   Objective: Vitals:   11/20/20 0441 11/20/20 1347 11/20/20 2144 11/21/20 0538  BP:  119/77 115/75 118/67  Pulse:  92 98 97  Resp:  18 16 16   Temp:  98.6 F (37 C) 98.5 F (36.9 C) 98.1 F (36.7 C)  TempSrc:  Oral Oral Oral  SpO2:  93% 95% 92%  Weight: (!) 218.8 kg     Height:        Intake/Output Summary (Last 24 hours) at 11/21/2020 0734 Last data filed at 11/21/2020 0538 Gross per 24 hour  Intake 1400 ml  Output 1450 ml  Net -50 ml   Filed Weights   11/18/20 0438 11/19/20 0455 11/20/20 0441  Weight: (!) 219.6 kg (!) 219 kg (!) 218.8 kg    Examination:  General exam: On room air currently.  No acute distress. Respiratory system: Bilateral decreased breath sounds at bases with scattered crackles Cardiovascular system: S1 & S2  heard, Rate controlled Gastrointestinal system: Abdomen is morbidly obese, nondistended, soft and nontender. Normal bowel sounds heard. Extremities: Bilateral lower extremity edema with chronic skin changes present.    Data Reviewed: I have personally reviewed following labs and imaging studies  CBC: No results for input(s): WBC, NEUTROABS, HGB, HCT, MCV, PLT in the last 168 hours. Basic Metabolic Panel: No results for input(s): NA, K, CL, CO2, GLUCOSE, BUN, CREATININE, CALCIUM, MG, PHOS in the last 168 hours. GFR: Estimated Creatinine Clearance: 161 mL/min (by C-G formula based on SCr of 0.83 mg/dL). Liver Function Tests: No results for input(s): AST, ALT, ALKPHOS, BILITOT,  PROT, ALBUMIN in the last 168 hours. No results for input(s): LIPASE, AMYLASE in the last 168 hours. No results for input(s): AMMONIA in the last 168 hours. Coagulation Profile: No results for input(s): INR, PROTIME in the last 168 hours. Cardiac Enzymes: No results for input(s): CKTOTAL, CKMB, CKMBINDEX, TROPONINI in the last 168 hours. BNP (last 3 results) No results for input(s): PROBNP in the last 8760 hours. HbA1C: No results for input(s): HGBA1C in the last 72 hours. CBG: Recent Labs  Lab 11/20/20 0807 11/20/20 1144 11/20/20 1634 11/20/20 2141 11/21/20 0713  GLUCAP 147* 186* 154* 145* 171*   Lipid Profile: No results for input(s): CHOL, HDL, LDLCALC, TRIG, CHOLHDL, LDLDIRECT in the last 72 hours. Thyroid Function Tests: No results for input(s): TSH, T4TOTAL, FREET4, T3FREE, THYROIDAB in the last 72 hours. Anemia Panel: No results for input(s): VITAMINB12, FOLATE, FERRITIN, TIBC, IRON, RETICCTPCT in the last 72 hours. Sepsis Labs: No results for input(s): PROCALCITON, LATICACIDVEN in the last 168 hours.  No results found for this or any previous visit (from the past 240 hour(s)).       Radiology Studies: No results found.      Scheduled Meds: . (feeding supplement) PROSource Plus  30 mL Oral Daily  . acetaminophen  1,000 mg Oral QHS  . enoxaparin (LOVENOX) injection  100 mg Subcutaneous Q24H  . ferrous sulfate  325 mg Oral Daily  . hydrocortisone cream   Topical BID  . hydrOXYzine  10 mg Oral TID  . insulin aspart  0-20 Units Subcutaneous TID WC  . insulin aspart  0-5 Units Subcutaneous QHS  . insulin glargine  18 Units Subcutaneous Daily  . magnesium oxide  400 mg Oral BID  . metoprolol tartrate  12.5 mg Oral BID  . nutrition supplement (JUVEN)  1 packet Oral BID BM  . potassium chloride  40 mEq Oral Daily  . Ensure Max Protein  11 oz Oral BID  . senna  1 tablet Oral BID   Continuous Infusions: . sodium chloride Stopped (11/15/20 1816)           Aline August, MD [facility] 11/21/2020, 7:34 AM

## 2020-11-21 NOTE — Plan of Care (Signed)
  Problem: Clinical Measurements: Goal: Cardiovascular complication will be avoided Outcome: Progressing   Problem: Activity: Goal: Risk for activity intolerance will decrease Outcome: Progressing   Problem: Elimination: Goal: Will not experience complications related to bowel motility Outcome: Progressing   Problem: Pain Managment: Goal: General experience of comfort will improve Outcome: Progressing   Problem: Safety: Goal: Ability to remain free from injury will improve Outcome: Progressing

## 2020-11-22 DIAGNOSIS — T148XXA Other injury of unspecified body region, initial encounter: Secondary | ICD-10-CM | POA: Diagnosis not present

## 2020-11-22 DIAGNOSIS — I890 Lymphedema, not elsewhere classified: Secondary | ICD-10-CM | POA: Diagnosis not present

## 2020-11-22 DIAGNOSIS — L089 Local infection of the skin and subcutaneous tissue, unspecified: Secondary | ICD-10-CM | POA: Diagnosis not present

## 2020-11-22 LAB — GLUCOSE, CAPILLARY
Glucose-Capillary: 147 mg/dL — ABNORMAL HIGH (ref 70–99)
Glucose-Capillary: 179 mg/dL — ABNORMAL HIGH (ref 70–99)
Glucose-Capillary: 145 mg/dL — ABNORMAL HIGH (ref 70–99)
Glucose-Capillary: 163 mg/dL — ABNORMAL HIGH (ref 70–99)

## 2020-11-22 NOTE — TOC Progression Note (Signed)
Transition of Care [facility]) - Progression Note    Patient Details  Name: [name] MRN: [id number] Date of Birth: Dec 01, 1965  [facility] CM/SW Contact  Lennart Pall, LCSW Phone Number: 11/22/2020, 1:09 PM  Clinical Narrative:    Met with pt to review recommendations by Dr. Lorin Mercy.  Noted that he recommended she have someone bring in her rollator so therapies can begin working with that in sessions.  She says that she will ask family but doesn't think there would be anyone to get that until next week.  Encouraged her to speak with them and stress importance to have here as soon as possible.  TOC continues to follow and case being followed by "Difficult to Place" rounds, however, no solution to finding SNF bed yet.     Expected Discharge Plan: [place] Barriers to Discharge: Continued Medical Work up  Expected Discharge Plan and Services Expected Discharge Plan: [place] In-house Referral: Clinical Social Work Discharge Planning Services: CM Consult Post Acute Care Choice: [place] Living arrangements for the past 2 months: Apartment                                       Social Determinants of Health (SDOH) Interventions    Readmission Risk Interventions No flowsheet data found.

## 2020-11-22 NOTE — Progress Notes (Signed)
Occupational Therapy Treatment Patient Details Name: [name] MRN: [id number] DOB: [birth year] Today's Date: 11/22/2020    History of present illness Pt admitted from home with multiple LE wounds, and FTT and unable to perform basic self care.  Pt reports she stayed in lift chair getting up only twice a day for BM or getting food.  Pt utilizing Rollator around home but pivoted to it from lift chair and wheeled around in seated position.  Pt states she put pads and diapers in chair so she did not have to get up to urinate.  Pt with hx of DM and lymphadema (has been unable to use her pressure sleeves so lymphadema has significantly worsened since last December).   OT comments  Worked on rolling left and right again today. Patient needs +2 assistance to be pulled towards right rail prior to attempt to roll. Patient able to initiate roll to the left with upper body and min A from tech - needs max assist x2 to roll lower body over. Patient able to maintain position holding onto bed rail.  patient with poor tolerance for LE movement due to hip pain. Attempted rolling to the R with min A for UB support and use of bed rail and max A x1-2 however patient unable to roll onto R hip "I just can't do it today." Patient reports change in weather made hips/legs hurt all day yesterday and still recovering from it today. Patient continues to need significant assist for bed level activity/rolling demonstrating very limited progress.    Follow Up Recommendations  SNF    Equipment Recommendations  None recommended by OT       Precautions / Restrictions Precautions Precautions: Fall Precaution Comments: posterior LLE wounds, place bed pad  to cushion when maxiskying.       Mobility Bed Mobility Overal bed mobility: Needs Assistance Bed Mobility: Rolling Rolling: Max assist;+2 for physical assistance         General bed mobility comments: Worked on rolling left and right again today. Patient  needs +2 assistance to be pulled towards right rail prior to attempt to roll. Patient able to initiate roll to the left with upper body and min A from tech - needs max assist x2 to roll lower body over. Patient able to maintain position holding onto bed rail.  patient with poor tolerance for LE movement due to hip pain. Attempted rolling to the R with min A for UB support and use of bed rail and max A x1-2 however patient unable to roll onto R hip "I just can't do it today"                          Cognition Arousal/Alertness: Awake/alert Behavior During Therapy: WFL for tasks assessed/performed Overall Cognitive Status: Within Functional Limits for tasks assessed                                                     Pertinent Vitals/ Pain       Pain Assessment: Faces Faces Pain Scale: Hurts whole lot Pain Location: hips and knees Pain Descriptors / Indicators: Aching;Discomfort;Moaning Pain Intervention(s): Monitored during session         Frequency  Min 2X/week        Progress Toward Goals  OT Goals(current goals  can now be found in the care plan section)  Progress towards OT goals: Not progressing toward goals - comment (ongoing pain in LEs and limited mobility)  Acute Rehab OT Goals Patient Stated Goal: manage myself and go home OT Goal Formulation: With patient Time For Goal Achievement: 11/27/20 Potential to Achieve Goals: Fair ADL Goals Pt/caregiver will Perform Home Exercise Program: Increased strength;Right Upper extremity;Left upper extremity Additional ADL Goal #1: Patient will tolerate chair position in bed x 10 minutes to work towards OOB ADL task Additional ADL Goal #2: Will tolerate leaning forward and maintain erect sitting in recliner x 2 minutes. Additional ADL Goal #3: Pt will increase bed mobility to rolling RT and LT with maximum assist of 1 person to decrease caregiver burden with bed level ADLs.  Plan Discharge plan  remains appropriate       AM-PAC OT "6 Clicks" Daily Activity     Outcome Measure   Help from another person eating meals?: None Help from another person taking care of personal grooming?: A Little Help from another person toileting, which includes using toliet, bedpan, or urinal?: Total Help from another person bathing (including washing, rinsing, drying)?: A Lot Help from another person to put on and taking off regular upper body clothing?: A Lot Help from another person to put on and taking off regular lower body clothing?: Total 6 Click Score: 13    End of Session  OT Visit Diagnosis: Other abnormalities of gait and mobility (R26.89);Muscle weakness (generalized) (M62.81);Pain Pain - part of body: Hip;Leg (bilateral)   Activity Tolerance Patient limited by pain   Patient Left in bed;with call bell/phone within reach   Nurse Communication Mobility status        Time: 1700-1749 OT Time Calculation (min): 26 min  Charges: OT General Charges $OT Visit: 1 Visit OT Treatments $Therapeutic Activity: 23-37 mins  Delbert Phenix OT OT pager: [phone]   Rosemary Holms 11/22/2020, 2:09 PM

## 2020-11-22 NOTE — Progress Notes (Signed)
Physical Therapy Treatment Patient Details Name: [name] MRN: [id number] DOB: 12/19/1965 Today's Date: 11/22/2020    History of Present Illness Pt admitted from home with multiple LE wounds, and FTT and unable to perform basic self care.  Pt reports she stayed in lift chair getting up only twice a day for BM or getting food.  Pt utilizing Rollator around home but pivoted to it from lift chair and wheeled around in seated position.  Pt states she put pads and diapers in chair so she did not have to get up to urinate.  Pt with hx of DM and lymphadema (has been unable to use her pressure sleeves so lymphadema has significantly worsened since last December).    PT Comments    Patient very limited with progress and declined to mobilize to recliner this date despite education on benefits; agreeable to chair position in bed at EOS. Pt spent ~18 minutes tilting from 30-40* and completed UE tossing and upper trunk rotation exercises. Pt limited by Rt>Lt knee pain with weight bearing. After tilt pt able to use bil UE to pull up with bed rail and scoot superiorly in bed. EOS bed placed in chair position and attempted forward trunk lean with pt using bil UE's on rail however pt unable to bring chest forward to bring back off of the mattress. Pt stating at times that she has been mobilizing and trying to do more however discussed with pt that she has not been up to chair since Monday 5/30 and has not requested use of compression pumps. Pt siting multiple limitations including knee/hip pain due to weather and pain meds wearing off, need for BM, and time limitations for staff. Encouraged pt to advocate for herself as well and to request transfers and use of pumps. She remains extremely limited functionally and continues to require significant assist for bed level activity/rolling demonstrating very limited progress.     Follow Up Recommendations  SNF     Equipment Recommendations  Wheelchair  (measurements PT);Wheelchair cushion (measurements PT)    Recommendations for Other Services       Precautions / Restrictions Precautions Precautions: Fall Precaution Comments: posterior LLE wounds, place bed pad  to cushion when maxiskying. Restrictions Weight Bearing Restrictions: No    Mobility  Bed Mobility Overal bed mobility: Needs Assistance Bed Mobility: Rolling Rolling: Max assist;+2 for physical assistance         General bed mobility comments: Pt using bed rails with bil UE use to pull up in bed and scoot superiorly. pt performed 2x without assist and 1x with assist at bed pad to prevent shearing. bed also placed in trendelenburg for this. Start Time: 3 Angle: 40 degrees (30-40) Total Minutes in Angle: 18 minutes Patient Response: Cooperative;Anxious  Transfers                 General transfer comment: pt declined to mobilize via maxisky to recliner due to concerns over need for BM. EOS pt placed in chair position in tilt bed.  Ambulation/Gait                 Stairs             Wheelchair Mobility    Modified Rankin (Stroke Patients Only)       Balance  Cognition Arousal/Alertness: Awake/alert Behavior During Therapy: WFL for tasks assessed/performed Overall Cognitive Status: Within Functional Limits for tasks assessed                                        Exercises Other Exercises Other Exercises: 4 reps bil UE contralateral reaching to hand off sock ball at 30* tilt. Other Exercises: 10x toss with Rt UE in 40* tilt, pt attempted 1x on Lt UE but unable to complete due to knee pain.    General Comments        Pertinent Vitals/Pain Pain Assessment: Faces Faces Pain Scale: Hurts whole lot Pain Location: hips and knees Pain Descriptors / Indicators: Aching;Discomfort;Moaning Pain Intervention(s): Monitored during session    Home Living                       Prior Function            PT Goals (current goals can now be found in the care plan section) Acute Rehab PT Goals Patient Stated Goal: manage myself and go home PT Goal Formulation: With patient Time For Goal Achievement: 11/25/20 Potential to Achieve Goals: Fair Progress towards PT goals: Not progressing toward goals - comment (pt limited by pain)    Frequency    Min 2X/week      PT Plan Current plan remains appropriate    Co-evaluation              AM-PAC PT "6 Clicks" Mobility   Outcome Measure  Help needed turning from your back to your side while in a flat bed without using bedrails?: Total Help needed moving from lying on your back to sitting on the side of a flat bed without using bedrails?: Total Help needed moving to and from a bed to a chair (including a wheelchair)?: Total Help needed standing up from a chair using your arms (e.g., wheelchair or bedside chair)?: Total Help needed to walk in hospital room?: Total Help needed climbing 3-5 steps with a railing? : Total 6 Click Score: 6    End of Session   Activity Tolerance: Patient tolerated treatment well Patient left: in bed;with call bell/phone within reach Nurse Communication: Mobility status;Need for lift equipment PT Visit Diagnosis: Muscle weakness (generalized) (M62.81);Difficulty in walking, not elsewhere classified (R26.2);Pain;Adult, failure to thrive (R62.7) Pain - Right/Left: Right Pain - part of body: Knee;Leg;Hip     Time: 8182-9937 PT Time Calculation (min) (ACUTE ONLY): 47 min  Charges:  $Therapeutic Exercise: 8-22 mins $Therapeutic Activity: 23-37 mins                     Verner Mould, DPT Acute Rehabilitation Services Office [phone] Pager [phone]     Jacques Navy 11/22/2020, 5:42 PM

## 2020-11-22 NOTE — Progress Notes (Signed)
Patient ID: [name], female   DOB: 08-02-1965, 55 y.o.   MRN: [id number]  PROGRESS NOTE    [name]  XIP:[id number] DOB: [birth year] DOA: 10/10/2020 PCP: Fanny Bien, MD   Brief Narrative:  55 year old female with history of morbid obesity, diabetes mellitus type 2, essential hypertension, chronic lymphedema admitted and treated for skin breakdown.  Patient is currently nonambulatory, lives alone and unable to care for self.  Pending SNF placement.  Medically stable for discharge.  Assessment & Plan:   Chronic bilateral lower extremity lymphedema -- Stable, continue lymphedema pumps -Continue PT/OT  Diabetes mellitus type 2 -- CBG stable.  Continue Lantus and sliding scale insulin.  Essential hypertension --Diuretics discontinued.  Stable, continue metoprolol.  Morbid obesity --Not interested in bariatric surgery  Disposition --Lives alone, unable to care for self, nonambulatory.  Plan for SNF.  Social worker following.  Ambulatory dysfunction -Orthopedics evaluation appreciated.  DVT prophylaxis: Lovenox Code Status: Full Family Communication: None at bedside Disposition Plan: Status is: Inpatient  Remains inpatient appropriate because:Unsafe d/c plan   Dispo: The patient is from: Home              Anticipated d/c is to: SNF              Patient currently is medically stable to d/c.   Difficult to place patient Yes   Consultants: Orthopedics  Procedures: None  Antimicrobials:  Anti-infectives (From admission, onward)   Start     Dose/Rate Route Frequency Ordered Stop   10/11/20 1000  vancomycin (VANCOREADY) IVPB 2000 mg/400 mL  Status:  Discontinued        2,000 mg 200 mL/hr over 120 Minutes Intravenous Every 12 hours 10/10/20 2047 10/11/20 1140   10/11/20 0100  ceFEPIme (MAXIPIME) 2 g in sodium chloride 0.9 % 100 mL IVPB  Status:  Discontinued        2 g 200 mL/hr over 30 Minutes Intravenous Every 8 hours 10/10/20 2001 10/11/20  1140   10/10/20 2100  vancomycin (VANCOCIN) 2,500 mg in sodium chloride 0.9 % 500 mL IVPB        2,500 mg 250 mL/hr over 120 Minutes Intravenous  Once 10/10/20 2001 10/11/20 0135   10/10/20 1545  ceFAZolin (ANCEF) IVPB 2g/100 mL premix        2 g 200 mL/hr over 30 Minutes Intravenous  Once 10/10/20 1530 10/10/20 1723       Subjective: Patient seen and examined at bedside.  Denies worsening shortness of breath, fever or vomiting. Objective: Vitals:   11/21/20 1325 11/21/20 2124 11/22/20 0453 11/22/20 0457  BP: 122/76 121/62 120/64   Pulse: 93 86 91   Resp: 18 16 16    Temp: 98.6 F (37 C) 98.6 F (37 C) 97.6 F (36.4 C)   TempSrc: Oral Oral Oral   SpO2: 94% 97% 92%   Weight:    (!) 217 kg  Height:        Intake/Output Summary (Last 24 hours) at 11/22/2020 0740 Last data filed at 11/22/2020 0412 Gross per 24 hour  Intake 2190 ml  Output 2500 ml  Net -310 ml   Filed Weights   11/19/20 0455 11/20/20 0441 11/22/20 0457  Weight: (!) 219 kg (!) 218.8 kg (!) 217 kg    Examination:  General exam: No distress.  On room air currently. Respiratory system: Bilateral decreased breath sounds at bases with scattered crackles Cardiovascular system: S1 & S2 heard, Rate controlled Gastrointestinal system: Abdomen is morbidly obese,  nondistended, soft and nontender. Normal bowel sounds heard. Extremities: Bilateral lower extremity edema with chronic skin changes present.    Data Reviewed: I have personally reviewed following labs and imaging studies  CBC: No results for input(s): WBC, NEUTROABS, HGB, HCT, MCV, PLT in the last 168 hours. Basic Metabolic Panel: No results for input(s): NA, K, CL, CO2, GLUCOSE, BUN, CREATININE, CALCIUM, MG, PHOS in the last 168 hours. GFR: Estimated Creatinine Clearance: 160.1 mL/min (by C-G formula based on SCr of 0.83 mg/dL). Liver Function Tests: No results for input(s): AST, ALT, ALKPHOS, BILITOT, PROT, ALBUMIN in the last 168 hours. No results  for input(s): LIPASE, AMYLASE in the last 168 hours. No results for input(s): AMMONIA in the last 168 hours. Coagulation Profile: No results for input(s): INR, PROTIME in the last 168 hours. Cardiac Enzymes: No results for input(s): CKTOTAL, CKMB, CKMBINDEX, TROPONINI in the last 168 hours. BNP (last 3 results) No results for input(s): PROBNP in the last 8760 hours. HbA1C: No results for input(s): HGBA1C in the last 72 hours. CBG: Recent Labs  Lab 11/21/20 0713 11/21/20 1157 11/21/20 1637 11/21/20 2122 11/22/20 0727  GLUCAP 171* 172* 153* 145* 147*   Lipid Profile: No results for input(s): CHOL, HDL, LDLCALC, TRIG, CHOLHDL, LDLDIRECT in the last 72 hours. Thyroid Function Tests: No results for input(s): TSH, T4TOTAL, FREET4, T3FREE, THYROIDAB in the last 72 hours. Anemia Panel: No results for input(s): VITAMINB12, FOLATE, FERRITIN, TIBC, IRON, RETICCTPCT in the last 72 hours. Sepsis Labs: No results for input(s): PROCALCITON, LATICACIDVEN in the last 168 hours.  No results found for this or any previous visit (from the past 240 hour(s)).       Radiology Studies: No results found.      Scheduled Meds: . (feeding supplement) PROSource Plus  30 mL Oral Daily  . acetaminophen  1,000 mg Oral QHS  . enoxaparin (LOVENOX) injection  100 mg Subcutaneous Q24H  . ferrous sulfate  325 mg Oral Daily  . hydrocortisone cream   Topical BID  . hydrOXYzine  10 mg Oral TID  . insulin aspart  0-20 Units Subcutaneous TID WC  . insulin aspart  0-5 Units Subcutaneous QHS  . insulin glargine  18 Units Subcutaneous Daily  . magnesium oxide  400 mg Oral BID  . metoprolol tartrate  12.5 mg Oral BID  . nutrition supplement (JUVEN)  1 packet Oral BID BM  . potassium chloride  40 mEq Oral Daily  . Ensure Max Protein  11 oz Oral BID  . senna  1 tablet Oral BID   Continuous Infusions: . sodium chloride Stopped (11/15/20 1816)          Aline August, MD [facility] 11/22/2020, 7:40 AM

## 2020-11-22 NOTE — Plan of Care (Signed)
  Problem: Education: Goal: Knowledge of General Education information will improve Description Including pain rating scale, medication(s)/side effects and non-pharmacologic comfort measures Outcome: Progressing   Problem: Activity: Goal: Risk for activity intolerance will decrease Outcome: Progressing   Problem: Safety: Goal: Ability to remain free from injury will improve Outcome: Progressing   

## 2020-11-23 DIAGNOSIS — T148XXA Other injury of unspecified body region, initial encounter: Secondary | ICD-10-CM | POA: Diagnosis not present

## 2020-11-23 DIAGNOSIS — L089 Local infection of the skin and subcutaneous tissue, unspecified: Secondary | ICD-10-CM | POA: Diagnosis not present

## 2020-11-23 DIAGNOSIS — I890 Lymphedema, not elsewhere classified: Secondary | ICD-10-CM | POA: Diagnosis not present

## 2020-11-23 LAB — GLUCOSE, CAPILLARY
Glucose-Capillary: 137 mg/dL — ABNORMAL HIGH (ref 70–99)
Glucose-Capillary: 164 mg/dL — ABNORMAL HIGH (ref 70–99)
Glucose-Capillary: 142 mg/dL — ABNORMAL HIGH (ref 70–99)
Glucose-Capillary: 155 mg/dL — ABNORMAL HIGH (ref 70–99)

## 2020-11-23 NOTE — Plan of Care (Signed)
  Problem: Coping: Goal: Level of anxiety will decrease Outcome: Progressing   Problem: Pain Managment: Goal: General experience of comfort will improve Outcome: Progressing   Problem: Safety: Goal: Ability to remain free from injury will improve Outcome: Progressing   

## 2020-11-23 NOTE — Progress Notes (Signed)
Patient ID: [name], female   DOB: 08/04/1965, 55 y.o.   MRN: [id number]  PROGRESS NOTE    [name]  LOV:[id number] DOB: 04/23/1966 DOA: 10/10/2020 PCP: Fanny Bien, MD   Brief Narrative:  55 year old female with history of morbid obesity, diabetes mellitus type 2, essential hypertension, chronic lymphedema admitted and treated for skin breakdown.  Patient is currently nonambulatory, lives alone and unable to care for self.  Pending SNF placement.  Medically stable for discharge.  Assessment & Plan:   Chronic bilateral lower extremity lymphedema -- Stable, continue lymphedema pumps -Continue PT/OT  Diabetes mellitus type 2 -- CBG stable.  Continue Lantus and sliding scale insulin.  Essential hypertension --Diuretics discontinued.  Stable, continue metoprolol.  Morbid obesity --Not interested in bariatric surgery  Disposition --Lives alone, unable to care for self, nonambulatory.  Plan for SNF.  Social worker following.  Ambulatory dysfunction -Orthopedics evaluation appreciated.  DVT prophylaxis: Lovenox Code Status: Full Family Communication: None at bedside Disposition Plan: Status is: Inpatient  Remains inpatient appropriate because:Unsafe d/c plan   Dispo: The patient is from: Home              Anticipated d/c is to: SNF              Patient currently is medically stable to d/c.   Difficult to place patient Yes   Consultants: Orthopedics  Procedures: None  Antimicrobials:  Anti-infectives (From admission, onward)   Start     Dose/Rate Route Frequency Ordered Stop   10/11/20 1000  vancomycin (VANCOREADY) IVPB 2000 mg/400 mL  Status:  Discontinued        2,000 mg 200 mL/hr over 120 Minutes Intravenous Every 12 hours 10/10/20 2047 10/11/20 1140   10/11/20 0100  ceFEPIme (MAXIPIME) 2 g in sodium chloride 0.9 % 100 mL IVPB  Status:  Discontinued        2 g 200 mL/hr over 30 Minutes Intravenous Every 8 hours 10/10/20 2001 10/11/20  1140   10/10/20 2100  vancomycin (VANCOCIN) 2,500 mg in sodium chloride 0.9 % 500 mL IVPB        2,500 mg 250 mL/hr over 120 Minutes Intravenous  Once 10/10/20 2001 10/11/20 0135   10/10/20 1545  ceFAZolin (ANCEF) IVPB 2g/100 mL premix        2 g 200 mL/hr over 30 Minutes Intravenous  Once 10/10/20 1530 10/10/20 1723       Subjective: Patient seen and examined at bedside.  Denies any new complaints.  No overnight fever or vomiting reported  objective: Vitals:   11/22/20 0457 11/22/20 1417 11/22/20 2220 11/23/20 0530  BP:  96/67 136/69 (!) 141/85  Pulse:  86 93 91  Resp:  18 18 18   Temp:  99.3 F (37.4 C) 98.5 F (36.9 C) 98.2 F (36.8 C)  TempSrc:  Oral Oral Oral  SpO2:  98% 94% 92%  Weight: (!) 217 kg     Height:        Intake/Output Summary (Last 24 hours) at 11/23/2020 0749 Last data filed at 11/23/2020 0530 Gross per 24 hour  Intake 600 ml  Output 950 ml  Net -350 ml   Filed Weights   11/19/20 0455 11/20/20 0441 11/22/20 0457  Weight: (!) 219 kg (!) 218.8 kg (!) 217 kg    Examination:  General exam: Currently on room air.  No acute distress.  Looks chronically ill.  Respiratory system: Bilateral decreased breath sounds at bases with scattered crackles Cardiovascular system: S1 &  S2 heard, Rate controlled Gastrointestinal system: Abdomen is morbidly obese, nondistended, soft and nontender. Normal bowel sounds heard. Extremities: Bilateral lower extremity edema with chronic skin changes present.    Data Reviewed: I have personally reviewed following labs and imaging studies  CBC: No results for input(s): WBC, NEUTROABS, HGB, HCT, MCV, PLT in the last 168 hours. Basic Metabolic Panel: No results for input(s): NA, K, CL, CO2, GLUCOSE, BUN, CREATININE, CALCIUM, MG, PHOS in the last 168 hours. GFR: Estimated Creatinine Clearance: 160.1 mL/min (by C-G formula based on SCr of 0.83 mg/dL). Liver Function Tests: No results for input(s): AST, ALT, ALKPHOS, BILITOT,  PROT, ALBUMIN in the last 168 hours. No results for input(s): LIPASE, AMYLASE in the last 168 hours. No results for input(s): AMMONIA in the last 168 hours. Coagulation Profile: No results for input(s): INR, PROTIME in the last 168 hours. Cardiac Enzymes: No results for input(s): CKTOTAL, CKMB, CKMBINDEX, TROPONINI in the last 168 hours. BNP (last 3 results) No results for input(s): PROBNP in the last 8760 hours. HbA1C: No results for input(s): HGBA1C in the last 72 hours. CBG: Recent Labs  Lab 11/22/20 0727 11/22/20 1129 11/22/20 1608 11/22/20 2219 11/23/20 0732  GLUCAP 147* 179* 145* 163* 137*   Lipid Profile: No results for input(s): CHOL, HDL, LDLCALC, TRIG, CHOLHDL, LDLDIRECT in the last 72 hours. Thyroid Function Tests: No results for input(s): TSH, T4TOTAL, FREET4, T3FREE, THYROIDAB in the last 72 hours. Anemia Panel: No results for input(s): VITAMINB12, FOLATE, FERRITIN, TIBC, IRON, RETICCTPCT in the last 72 hours. Sepsis Labs: No results for input(s): PROCALCITON, LATICACIDVEN in the last 168 hours.  No results found for this or any previous visit (from the past 240 hour(s)).       Radiology Studies: No results found.      Scheduled Meds: . (feeding supplement) PROSource Plus  30 mL Oral Daily  . acetaminophen  1,000 mg Oral QHS  . enoxaparin (LOVENOX) injection  100 mg Subcutaneous Q24H  . ferrous sulfate  325 mg Oral Daily  . hydrocortisone cream   Topical BID  . hydrOXYzine  10 mg Oral TID  . insulin aspart  0-20 Units Subcutaneous TID WC  . insulin aspart  0-5 Units Subcutaneous QHS  . insulin glargine  18 Units Subcutaneous Daily  . magnesium oxide  400 mg Oral BID  . metoprolol tartrate  12.5 mg Oral BID  . nutrition supplement (JUVEN)  1 packet Oral BID BM  . potassium chloride  40 mEq Oral Daily  . Ensure Max Protein  11 oz Oral BID  . senna  1 tablet Oral BID   Continuous Infusions: . sodium chloride Stopped (11/15/20 1816)           Aline August, MD [facility] 11/23/2020, 7:49 AM

## 2020-11-23 NOTE — Plan of Care (Signed)
  Problem: Education: Goal: Knowledge of General Education information will improve Description: Including pain rating scale, medication(s)/side effects and non-pharmacologic comfort measures Outcome: Progressing   Problem: Skin Integrity: Goal: Risk for impaired skin integrity will decrease Outcome: Progressing   Problem: Pain Managment: Goal: General experience of comfort will improve Outcome: Progressing   Problem: Nutrition: Goal: Adequate nutrition will be maintained Outcome: Progressing

## 2020-11-24 DIAGNOSIS — L089 Local infection of the skin and subcutaneous tissue, unspecified: Secondary | ICD-10-CM | POA: Diagnosis not present

## 2020-11-24 DIAGNOSIS — I890 Lymphedema, not elsewhere classified: Secondary | ICD-10-CM | POA: Diagnosis not present

## 2020-11-24 DIAGNOSIS — T148XXA Other injury of unspecified body region, initial encounter: Secondary | ICD-10-CM | POA: Diagnosis not present

## 2020-11-24 LAB — GLUCOSE, CAPILLARY
Glucose-Capillary: 152 mg/dL — ABNORMAL HIGH (ref 70–99)
Glucose-Capillary: 164 mg/dL — ABNORMAL HIGH (ref 70–99)
Glucose-Capillary: 149 mg/dL — ABNORMAL HIGH (ref 70–99)
Glucose-Capillary: 164 mg/dL — ABNORMAL HIGH (ref 70–99)

## 2020-11-24 NOTE — Plan of Care (Signed)
  Problem: Health Behavior/Discharge Planning: Goal: Ability to manage health-related needs will improve Outcome: Progressing   

## 2020-11-24 NOTE — Plan of Care (Signed)
  Problem: Education: Goal: Knowledge of General Education information will improve Description: Including pain rating scale, medication(s)/side effects and non-pharmacologic comfort measures Outcome: Progressing   Problem: Nutrition: Goal: Adequate nutrition will be maintained Outcome: Progressing   Problem: Coping: Goal: Level of anxiety will decrease Outcome: Progressing   Problem: Elimination: Goal: Will not experience complications related to bowel motility Outcome: Progressing Goal: Will not experience complications related to urinary retention Outcome: Progressing   Problem: Pain Managment: Goal: General experience of comfort will improve Outcome: Progressing   

## 2020-11-24 NOTE — Progress Notes (Signed)
Patient ID: [name], female   DOB: Apr 26, 1966, 55 y.o.   MRN: [id number]  PROGRESS NOTE    BELA BONAPARTE  GEZ:[id number] DOB: 1966-06-20 DOA: 10/10/2020 PCP: Fanny Bien, MD   Brief Narrative:  55 year old female with history of morbid obesity, diabetes mellitus type 2, essential hypertension, chronic lymphedema admitted and treated for skin breakdown.  Patient is currently nonambulatory, lives alone and unable to care for self.  Pending SNF placement.  Medically stable for discharge.  Assessment & Plan:   Chronic bilateral lower extremity lymphedema -- Stable, continue lymphedema pumps -Continue PT/OT  Diabetes mellitus type 2 -- CBG stable.  Continue Lantus and sliding scale insulin.  Essential hypertension --Diuretics discontinued.  Stable, continue metoprolol.  Morbid obesity --Not interested in bariatric surgery  Disposition --Lives alone, unable to care for self, nonambulatory.  Plan for SNF.  Social worker following.  Ambulatory dysfunction -Orthopedics evaluation appreciated.  DVT prophylaxis: Lovenox Code Status: Full Family Communication: None at bedside Disposition Plan: Status is: Inpatient  Remains inpatient appropriate because:Unsafe d/c plan   Dispo: The patient is from: Home              Anticipated d/c is to: SNF              Patient currently is medically stable to d/c.   Difficult to place patient Yes   Consultants: Orthopedics  Procedures: None  Antimicrobials:  Anti-infectives (From admission, onward)   Start     Dose/Rate Route Frequency Ordered Stop   10/11/20 1000  vancomycin (VANCOREADY) IVPB 2000 mg/400 mL  Status:  Discontinued        2,000 mg 200 mL/hr over 120 Minutes Intravenous Every 12 hours 10/10/20 2047 10/11/20 1140   10/11/20 0100  ceFEPIme (MAXIPIME) 2 g in sodium chloride 0.9 % 100 mL IVPB  Status:  Discontinued        2 g 200 mL/hr over 30 Minutes Intravenous Every 8 hours 10/10/20 2001 10/11/20  1140   10/10/20 2100  vancomycin (VANCOCIN) 2,500 mg in sodium chloride 0.9 % 500 mL IVPB        2,500 mg 250 mL/hr over 120 Minutes Intravenous  Once 10/10/20 2001 10/11/20 0135   10/10/20 1545  ceFAZolin (ANCEF) IVPB 2g/100 mL premix        2 g 200 mL/hr over 30 Minutes Intravenous  Once 10/10/20 1530 10/10/20 1723       Subjective: Patient seen and examined at bedside.  Denies any new complaints.  No fever, worsening shortness of breath reported.   Objective: Vitals:   11/23/20 0530 11/23/20 1340 11/23/20 2120 11/24/20 0524  BP: (!) 141/85 122/73 133/75 125/77  Pulse: 91 92 95 89  Resp: 18 19 18 18   Temp: 98.2 F (36.8 C) 98.3 F (36.8 C) 98.6 F (37 C) 98 F (36.7 C)  TempSrc: Oral  Oral   SpO2: 92% 93% 94% 92%  Weight:      Height:        Intake/Output Summary (Last 24 hours) at 11/24/2020 0807 Last data filed at 11/24/2020 0250 Gross per 24 hour  Intake 840 ml  Output 1150 ml  Net -310 ml   Filed Weights   11/19/20 0455 11/20/20 0441 11/22/20 0457  Weight: (!) 219 kg (!) 218.8 kg (!) 217 kg    Examination:  General exam: No distress.  Chronically ill looking.  On room air currently.   Respiratory system: Bilateral decreased breath sounds at bases with scattered crackles  Cardiovascular system: S1 & S2 heard, Rate controlled Gastrointestinal system: Abdomen is morbidly obese, nondistended, soft and nontender. Normal bowel sounds heard. Extremities: Bilateral lower extremity edema with chronic skin changes present.    Data Reviewed: I have personally reviewed following labs and imaging studies  CBC: No results for input(s): WBC, NEUTROABS, HGB, HCT, MCV, PLT in the last 168 hours. Basic Metabolic Panel: No results for input(s): NA, K, CL, CO2, GLUCOSE, BUN, CREATININE, CALCIUM, MG, PHOS in the last 168 hours. GFR: Estimated Creatinine Clearance: 160.1 mL/min (by C-G formula based on SCr of 0.83 mg/dL). Liver Function Tests: No results for input(s): AST,  ALT, ALKPHOS, BILITOT, PROT, ALBUMIN in the last 168 hours. No results for input(s): LIPASE, AMYLASE in the last 168 hours. No results for input(s): AMMONIA in the last 168 hours. Coagulation Profile: No results for input(s): INR, PROTIME in the last 168 hours. Cardiac Enzymes: No results for input(s): CKTOTAL, CKMB, CKMBINDEX, TROPONINI in the last 168 hours. BNP (last 3 results) No results for input(s): PROBNP in the last 8760 hours. HbA1C: No results for input(s): HGBA1C in the last 72 hours. CBG: Recent Labs  Lab 11/23/20 0732 11/23/20 1134 11/23/20 1636 11/23/20 2121 11/24/20 0728  GLUCAP 137* 164* 142* 155* 152*   Lipid Profile: No results for input(s): CHOL, HDL, LDLCALC, TRIG, CHOLHDL, LDLDIRECT in the last 72 hours. Thyroid Function Tests: No results for input(s): TSH, T4TOTAL, FREET4, T3FREE, THYROIDAB in the last 72 hours. Anemia Panel: No results for input(s): VITAMINB12, FOLATE, FERRITIN, TIBC, IRON, RETICCTPCT in the last 72 hours. Sepsis Labs: No results for input(s): PROCALCITON, LATICACIDVEN in the last 168 hours.  No results found for this or any previous visit (from the past 240 hour(s)).       Radiology Studies: No results found.      Scheduled Meds: . (feeding supplement) PROSource Plus  30 mL Oral Daily  . acetaminophen  1,000 mg Oral QHS  . enoxaparin (LOVENOX) injection  100 mg Subcutaneous Q24H  . ferrous sulfate  325 mg Oral Daily  . hydrocortisone cream   Topical BID  . hydrOXYzine  10 mg Oral TID  . insulin aspart  0-20 Units Subcutaneous TID WC  . insulin aspart  0-5 Units Subcutaneous QHS  . insulin glargine  18 Units Subcutaneous Daily  . magnesium oxide  400 mg Oral BID  . metoprolol tartrate  12.5 mg Oral BID  . nutrition supplement (JUVEN)  1 packet Oral BID BM  . potassium chloride  40 mEq Oral Daily  . Ensure Max Protein  11 oz Oral BID  . senna  1 tablet Oral BID   Continuous Infusions: . sodium chloride Stopped  (11/15/20 1816)          Aline August, MD [facility] 11/24/2020, 8:07 AM

## 2020-11-24 NOTE — Plan of Care (Signed)
  Problem: Pain Managment: Goal: General experience of comfort will improve 11/24/2020 0444 by Blase Mess, RN Outcome: Progressing 11/24/2020 0429 by Blase Mess, RN Outcome: Progressing

## 2020-11-24 NOTE — Plan of Care (Signed)
  Problem: Pain Managment: Goal: General experience of comfort will improve Outcome: Progressing   Problem: Safety: Goal: Ability to remain free from injury will improve Outcome: Progressing   

## 2020-11-25 LAB — GLUCOSE, CAPILLARY
Glucose-Capillary: 143 mg/dL — ABNORMAL HIGH (ref 70–99)
Glucose-Capillary: 172 mg/dL — ABNORMAL HIGH (ref 70–99)
Glucose-Capillary: 170 mg/dL — ABNORMAL HIGH (ref 70–99)
Glucose-Capillary: 159 mg/dL — ABNORMAL HIGH (ref 70–99)

## 2020-11-25 NOTE — Progress Notes (Signed)
Physical Therapy Treatment Patient Details Name: [name] MRN: [id number] DOB: [birth year] Today's Date: 11/25/2020    History of Present Illness Pt admitted from home with multiple LE wounds, and FTT and unable to perform basic self care.  Pt reports she stayed in lift chair getting up only twice a day for BM or getting food.  Pt utilizing Rollator around home but pivoted to it from lift chair and wheeled around in seated position.  Pt states she put pads and diapers in chair so she did not have to get up to urinate.  Pt with hx of DM and lymphadema (has been unable to use her pressure sleeves so lymphadema has significantly worsened since last December).    PT Comments    Pt progressing slowly, improved ability to forward flex trunk and attempt to flex knees bringing feet to floor. Continue POC.   Follow Up Recommendations  SNF     Equipment Recommendations  Wheelchair (measurements PT);Wheelchair cushion (measurements PT)    Recommendations for Other Services       Precautions / Restrictions Precautions Precautions: Fall Precaution Comments: posterior and medial LLE wounds, place bed pad  to cushion when maxiskying. Restrictions Weight Bearing Restrictions: No    Mobility  Bed Mobility Overal bed mobility: Needs Assistance Bed Mobility: Rolling Rolling: Max assist;+2 for physical assistance         General bed mobility comments: multi-modal cues for participation, sequencing, reaching for rail etc. +2 to roll with 3rd person to place maxi-sky pad    Transfers                    Ambulation/Gait                 Stairs             Wheelchair Mobility    Modified Rankin (Stroke Patients Only)       Balance                                            Cognition Arousal/Alertness: Awake/alert Behavior During Therapy: WFL for tasks assessed/performed Overall Cognitive Status: Within Functional Limits for tasks  assessed                                        Exercises Other Exercises Other Exercises: head and trunk lifts off back of chair with feet on floor (pillow); cues for use of UEs, ABD contraction and pulling feet toward chair to incr knee flexion    General Comments        Pertinent Vitals/Pain Pain Assessment: Faces Faces Pain Scale: Hurts whole lot Pain Location: hips and knees Pain Descriptors / Indicators: Aching;Discomfort;Moaning Pain Intervention(s): Limited activity within patient's tolerance;Monitored during session;Premedicated before session;Repositioned    Home Living                      Prior Function            PT Goals (current goals can now be found in the care plan section) Acute Rehab PT Goals Patient Stated Goal: manage myself and go home PT Goal Formulation: With patient Time For Goal Achievement: 11/25/20 Potential to Achieve Goals: Fair Progress towards PT goals: Progressing toward goals  Frequency    Min 2X/week      PT Plan Current plan remains appropriate    Co-evaluation              AM-PAC PT "6 Clicks" Mobility   Outcome Measure  Help needed turning from your back to your side while in a flat bed without using bedrails?: Total Help needed moving from lying on your back to sitting on the side of a flat bed without using bedrails?: Total Help needed moving to and from a bed to a chair (including a wheelchair)?: Total Help needed standing up from a chair using your arms (e.g., wheelchair or bedside chair)?: Total Help needed to walk in hospital room?: Total Help needed climbing 3-5 steps with a railing? : Total 6 Click Score: 6    End of Session Equipment Utilized During Treatment: Other (comment) (maxisky) Activity Tolerance: Patient tolerated treatment well Patient left: in chair;with call bell/phone within reach;Other (comment) (Kpads on knees, all needs, phones in reach) Nurse Communication:  Mobility status;Need for lift equipment PT Visit Diagnosis: Muscle weakness (generalized) (M62.81);Difficulty in walking, not elsewhere classified (R26.2);Pain;Adult, failure to thrive (R62.7) Pain - Right/Left: Right Pain - part of body: Knee;Leg;Hip     Time: 6734-1937 PT Time Calculation (min) (ACUTE ONLY): 38 min  Charges:  $Therapeutic Activity: 38-52 mins                     Baxter Flattery, PT  Acute Rehab Dept ([place]) [phone] Pager [phone]  11/25/2020    [facility] 11/25/2020, 2:36 PM

## 2020-11-25 NOTE — Plan of Care (Signed)
  Problem: Education: Goal: Knowledge of General Education information will improve Description: Including pain rating scale, medication(s)/side effects and non-pharmacologic comfort measures Outcome: Progressing   Problem: Coping: Goal: Level of anxiety will decrease Outcome: Progressing   Problem: Elimination: Goal: Will not experience complications related to urinary retention Outcome: Progressing   Problem: Pain Managment: Goal: General experience of comfort will improve Outcome: Progressing

## 2020-11-25 NOTE — Progress Notes (Signed)
Patient ID: [name], female   DOB: Jan 22, 1966, 55 y.o.   MRN: [id number]  PROGRESS NOTE    SHARNITA BOGUCKI  NOM:[id number] DOB: Nov 13, 1965 DOA: 10/10/2020 PCP: Fanny Bien, MD   Brief Narrative:  55 year old female with history of morbid obesity, diabetes mellitus type 2, essential hypertension, chronic lymphedema admitted and treated for skin breakdown.  Patient is currently nonambulatory, lives alone and unable to care for self.  Pending SNF placement.  Medically stable for discharge.  Assessment & Plan:   Chronic bilateral lower extremity lymphedema -- Stable, continue lymphedema pumps -Continue PT/OT  Diabetes mellitus type 2 -- CBG stable.  Continue Lantus and sliding scale insulin.  Essential hypertension --Diuretics discontinued.  Stable, continue metoprolol.  Morbid obesity --Not interested in bariatric surgery  Disposition --Lives alone, unable to care for self, nonambulatory.  Plan for SNF.  Social worker following.  Ambulatory dysfunction -Orthopedics evaluation appreciated.  DVT prophylaxis: Lovenox Code Status: Full Family Communication: None at bedside Disposition Plan: Status is: Inpatient  Remains inpatient appropriate because:Unsafe d/c plan   Dispo: The patient is from: Home              Anticipated d/c is to: SNF              Patient currently is medically stable to d/c.   Difficult to place patient Yes   Consultants: Orthopedics  Procedures: None  Antimicrobials:  Anti-infectives (From admission, onward)   Start     Dose/Rate Route Frequency Ordered Stop   10/11/20 1000  vancomycin (VANCOREADY) IVPB 2000 mg/400 mL  Status:  Discontinued        2,000 mg 200 mL/hr over 120 Minutes Intravenous Every 12 hours 10/10/20 2047 10/11/20 1140   10/11/20 0100  ceFEPIme (MAXIPIME) 2 g in sodium chloride 0.9 % 100 mL IVPB  Status:  Discontinued        2 g 200 mL/hr over 30 Minutes Intravenous Every 8 hours 10/10/20 2001 10/11/20  1140   10/10/20 2100  vancomycin (VANCOCIN) 2,500 mg in sodium chloride 0.9 % 500 mL IVPB        2,500 mg 250 mL/hr over 120 Minutes Intravenous  Once 10/10/20 2001 10/11/20 0135   10/10/20 1545  ceFAZolin (ANCEF) IVPB 2g/100 mL premix        2 g 200 mL/hr over 30 Minutes Intravenous  Once 10/10/20 1530 10/10/20 1723       Subjective: Patient seen and examined at bedside.  Patient has no new complaints.  No worsening abdominal pain or fever reported.   Objective: Vitals:   11/24/20 0524 11/24/20 1342 11/24/20 2133 11/25/20 0554  BP: 125/77 129/71 135/72 138/77  Pulse: 89 90 76 94  Resp: 18 18 16 16   Temp: 98 F (36.7 C) 98.3 F (36.8 C) 98.8 F (37.1 C) 97.9 F (36.6 C)  TempSrc:   Oral Oral  SpO2: 92% 94% 98% 90%  Weight:      Height:        Intake/Output Summary (Last 24 hours) at 11/25/2020 0742 Last data filed at 11/25/2020 0554 Gross per 24 hour  Intake 670 ml  Output 1600 ml  Net -930 ml   Filed Weights   11/19/20 0455 11/20/20 0441 11/22/20 0457  Weight: (!) 219 kg (!) 218.8 kg (!) 217 kg    Examination:  General exam: Currently on room air.  No acute distress.  Chronically ill looking.  Respiratory system: Bilateral decreased breath sounds at bases with scattered crackles  Cardiovascular system: S1 & S2 heard, Rate controlled Gastrointestinal system: Abdomen is morbidly obese, nondistended, soft and nontender. Normal bowel sounds heard. Extremities: Bilateral lower extremity edema with chronic skin changes present.    Data Reviewed: I have personally reviewed following labs and imaging studies  CBC: No results for input(s): WBC, NEUTROABS, HGB, HCT, MCV, PLT in the last 168 hours. Basic Metabolic Panel: No results for input(s): NA, K, CL, CO2, GLUCOSE, BUN, CREATININE, CALCIUM, MG, PHOS in the last 168 hours. GFR: Estimated Creatinine Clearance: 160.1 mL/min (by C-G formula based on SCr of 0.83 mg/dL). Liver Function Tests: No results for input(s): AST,  ALT, ALKPHOS, BILITOT, PROT, ALBUMIN in the last 168 hours. No results for input(s): LIPASE, AMYLASE in the last 168 hours. No results for input(s): AMMONIA in the last 168 hours. Coagulation Profile: No results for input(s): INR, PROTIME in the last 168 hours. Cardiac Enzymes: No results for input(s): CKTOTAL, CKMB, CKMBINDEX, TROPONINI in the last 168 hours. BNP (last 3 results) No results for input(s): PROBNP in the last 8760 hours. HbA1C: No results for input(s): HGBA1C in the last 72 hours. CBG: Recent Labs  Lab 11/24/20 0728 11/24/20 1155 11/24/20 1627 11/24/20 2129 11/25/20 0738  GLUCAP 152* 164* 149* 164* 143*   Lipid Profile: No results for input(s): CHOL, HDL, LDLCALC, TRIG, CHOLHDL, LDLDIRECT in the last 72 hours. Thyroid Function Tests: No results for input(s): TSH, T4TOTAL, FREET4, T3FREE, THYROIDAB in the last 72 hours. Anemia Panel: No results for input(s): VITAMINB12, FOLATE, FERRITIN, TIBC, IRON, RETICCTPCT in the last 72 hours. Sepsis Labs: No results for input(s): PROCALCITON, LATICACIDVEN in the last 168 hours.  No results found for this or any previous visit (from the past 240 hour(s)).       Radiology Studies: No results found.      Scheduled Meds: . (feeding supplement) PROSource Plus  30 mL Oral Daily  . acetaminophen  1,000 mg Oral QHS  . enoxaparin (LOVENOX) injection  100 mg Subcutaneous Q24H  . ferrous sulfate  325 mg Oral Daily  . hydrocortisone cream   Topical BID  . hydrOXYzine  10 mg Oral TID  . insulin aspart  0-20 Units Subcutaneous TID WC  . insulin aspart  0-5 Units Subcutaneous QHS  . insulin glargine  18 Units Subcutaneous Daily  . magnesium oxide  400 mg Oral BID  . metoprolol tartrate  12.5 mg Oral BID  . nutrition supplement (JUVEN)  1 packet Oral BID BM  . potassium chloride  40 mEq Oral Daily  . Ensure Max Protein  11 oz Oral BID  . senna  1 tablet Oral BID   Continuous Infusions: . sodium chloride Stopped  (11/15/20 1816)          Aline August, MD [facility] 11/25/2020, 7:42 AM

## 2020-11-26 LAB — GLUCOSE, CAPILLARY
Glucose-Capillary: 160 mg/dL — ABNORMAL HIGH (ref 70–99)
Glucose-Capillary: 193 mg/dL — ABNORMAL HIGH (ref 70–99)
Glucose-Capillary: 130 mg/dL — ABNORMAL HIGH (ref 70–99)
Glucose-Capillary: 185 mg/dL — ABNORMAL HIGH (ref 70–99)

## 2020-11-26 MED ORDER — LORATADINE 10 MG PO TABS
10.0000 mg | ORAL_TABLET | Freq: Every evening | ORAL | Status: DC
  Administered 2020-11-26 – 2020-12-27 (×32): 10 mg via ORAL
  Filled 2020-11-26 (×32): qty 1

## 2020-11-26 NOTE — Plan of Care (Signed)
  Problem: Education: Goal: Knowledge of General Education information will improve Description: Including pain rating scale, medication(s)/side effects and non-pharmacologic comfort measures Outcome: Progressing   Problem: Nutrition: Goal: Adequate nutrition will be maintained Outcome: Progressing   Problem: Coping: Goal: Level of anxiety will decrease Outcome: Progressing   Problem: Elimination: Goal: Will not experience complications related to urinary retention Outcome: Progressing   Problem: Pain Managment: Goal: General experience of comfort will improve Outcome: Progressing   

## 2020-11-26 NOTE — Progress Notes (Signed)
Occupational Therapy Treatment Patient Details Name: [name] MRN: [id number] DOB: Jul 14, 1965 Today's Date: 11/26/2020    History of present illness Pt admitted from home with multiple LE wounds, and FTT and unable to perform basic self care.  Pt reports she stayed in lift chair getting up only twice a day for BM or getting food.  Pt utilizing Rollator around home but pivoted to it from lift chair and wheeled around in seated position.  Pt states she put pads and diapers in chair so she did not have to get up to urinate.  Pt with hx of DM and lymphadema (has been unable to use her pressure sleeves so lymphadema has significantly worsened since last December).   OT comments  Patient has shown an overall plateau in her ADL functioning and progress as well as with bed mobility. Pt had not yet had pain meds when OT arrived today and therefore asked to hold mobility training. Had talk with pt regarding the OT goals that she has met and the current need for OT to consider sign off to avoid duplication of services with physical therapy.  Pt was receptive and agreed that pt is performing her UB ADLs as independently as possible at bed and chair level, and it not yet ready to work towards LB ADLs until pt able to tolerate increased ROM in hips/knees.  Pt also stated that nursing has been hoyering her up to chair and con continue to do so. Pt was asked to demonstrate her UB HEP with therabands and pt stated that she could tolerate this. Pt had two LE spasms during exercises but stated that she was able to continue exercises with deep breathing.  OT added 3 new shoulder exercises to pt's routine and pt given handout to help remember. Pt demonstrated all exercises correctly.  Pt requested one more OT visit to revisit HEP to ensure that she is performing correctly.  Pt agreed to perform 10 reps at least 3 x/day.  Will keep pt on for 1 more session, then will need to consider d/c of acute OT services for now.  Will plan to continue to monitor her progress through PT and will be happy to restart OT services once pt can tolerate addressing LE ADLs and functional mobility needed for bathroom ADLs.     Follow Up Recommendations  SNF    Equipment Recommendations  None recommended by OT    Recommendations for Other Services      Precautions / Restrictions Precautions Precautions: Fall Precaution Comments: posterior and medial LLE wounds, place bed pad  to cushion when maxiskying. Restrictions Weight Bearing Restrictions: No       Mobility Bed Mobility                    Transfers                      Balance                                           ADL either performed or assessed with clinical judgement   ADL  Vision       Perception     Praxis      Cognition Arousal/Alertness: Awake/alert Behavior During Therapy: WFL for tasks assessed/performed Overall Cognitive Status: Within Functional Limits for tasks assessed                                          Exercises General Exercises - Upper Extremity Shoulder Flexion: Both;10 reps;Theraband;Supine;Strengthening Theraband Level (Shoulder Flexion): Level 2 (Red) Shoulder Horizontal ABduction: Strengthening Shoulder Horizontal ADduction: Theraband;Both;10 reps;Supine;Strengthening Theraband Level (Shoulder Horizontal Adduction): Level 2 (Red) Elbow Flexion: Theraband;Strengthening;10 reps;Supine Theraband Level (Elbow Flexion): Level 2 (Red) Elbow Extension: Supine;Strengthening;Both;10 reps Other Exercises Other Exercises: 3 reps bed pull ups with use of Bil bed rails.  BUE theraband ER low x10 reps in supine.  Pt also educated in sequenced breathing to optimize energy during exercises.   Shoulder Instructions       General Comments      Pertinent Vitals/ Pain       Pain Assessment: Faces Faces  Pain Scale: Hurts whole lot Pain Location: hips and knees Pain Descriptors / Indicators: Aching;Discomfort;Moaning Pain Intervention(s): Limited activity within patient's tolerance;Monitored during session;RN gave pain meds during session  Home Living                                          Prior Functioning/Environment              Frequency  Min 2X/week        Progress Toward Goals  OT Goals(current goals can now be found in the care plan section)  Progress towards OT goals: Not progressing toward goals - comment (Pt has progressed with former OT goals and is receving PT for mobility goals at this time.  Pt did advance her HEP today.)  Acute Rehab OT Goals Patient Stated Goal: manage myself and go home  Plan Discharge plan remains appropriate    Co-evaluation                 AM-PAC OT "6 Clicks" Daily Activity     Outcome Measure   Help from another person eating meals?: None Help from another person taking care of personal grooming?: A Little Help from another person toileting, which includes using toliet, bedpan, or urinal?: Total Help from another person bathing (including washing, rinsing, drying)?: A Lot Help from another person to put on and taking off regular upper body clothing?: A Lot Help from another person to put on and taking off regular lower body clothing?: Total 6 Click Score: 13    End of Session    OT Visit Diagnosis: Other abnormalities of gait and mobility (R26.89);Muscle weakness (generalized) (M62.81);Pain Pain - part of body: Hip;Leg (bilateral)   Activity Tolerance Patient limited by pain   Patient Left in bed;with call bell/phone within reach   Nurse Communication Mobility status        Time: 1115-1150 OT Time Calculation (min): 35 min  Charges: OT General Charges $OT Visit: 1 Visit OT Treatments $Therapeutic Exercise: 23-37 mins  Anderson [place], [place] Office:  [phone] 11/26/2020   Julien Girt 11/26/2020, 1:01 PM

## 2020-11-26 NOTE — Progress Notes (Signed)
Progress Note    [name]   BTD:[id number]  DOB: 07-20-1965  DOA: 10/10/2020     42  PCP: Fanny Bien, MD  CC: skin breakdown  Hospital Course: 55 year old female with history of morbid obesity, diabetes mellitus type 2, essential hypertension, chronic lymphedema admitted and treated for skin breakdown.  Patient is currently nonambulatory, lives alone and unable to care for self.  Pending SNF placement.  Medically stable for discharge.  Interval History:  No events overnight. Resting in bed comfortably. She says she is voiding well and having normal bowel movements.   ROS: Constitutional: negative for chills and fevers, Respiratory: negative for cough, Cardiovascular: negative for chest pain and Gastrointestinal: negative for abdominal pain  Assessment & Plan: Chronic bilateral lower extremity lymphedema -- Stable, continue lymphedema pumps -Continue PT/OT  Diabetes mellitus type 2 -- CBG stable. Continue Lantus and sliding scale insulin.  Essential hypertension --Diuretics discontinued. Stable, continue metoprolol.  Morbid obesity --Not interested in bariatric surgery  Disposition --Lives alone, unable to care for self, nonambulatory. Plan for SNF.  Social worker following. - target weight goal 450 lb prior to discharge to SNF   Old records reviewed in assessment of this patient  Antimicrobials:   DVT prophylaxis:    Code Status:   Code Status: Full Code Family Communication:   Disposition Plan: Status is: Inpatient  Remains inpatient appropriate because:Unsafe d/c plan   Dispo: The patient is from: Home              Anticipated d/c is to: SNF              Patient currently is medically stable to d/c.   Difficult to place patient Yes  Risk of unplanned readmission score: Unplanned Admission- Pilot do not use: 13.08   Objective: Blood pressure 127/67, pulse 86, temperature 98.2 F (36.8 C), temperature source Oral, resp. rate  18, height 5\' 10"  (1.778 m), weight (!) 216.8 kg, SpO2 95 %.  Examination: General appearance: alert, cooperative and no distress Head: Normocephalic, without obvious abnormality, atraumatic Eyes: EOMI Lungs: clear to auscultation bilaterally Heart: regular rate and rhythm and S1, S2 normal Abdomen: obese, soft, NT, ND, BS present but distant Extremities: obese; lymphedema noted Skin: mobility and turgor normal Neurologic: Grossly normal  Consultants:     Procedures:     Data Reviewed: I have personally reviewed following labs and imaging studies Results for orders placed or performed during the hospital encounter of 10/10/20 (from the past 24 hour(s))  Glucose, capillary     Status: Abnormal   Collection Time: 11/25/20  9:32 PM  Result Value Ref Range   Glucose-Capillary 159 (H) 70 - 99 mg/dL  Glucose, capillary     Status: Abnormal   Collection Time: 11/26/20  7:33 AM  Result Value Ref Range   Glucose-Capillary 160 (H) 70 - 99 mg/dL   Comment 1 Notify RN   Glucose, capillary     Status: Abnormal   Collection Time: 11/26/20 11:47 AM  Result Value Ref Range   Glucose-Capillary 193 (H) 70 - 99 mg/dL   Comment 1 Notify RN   Glucose, capillary     Status: Abnormal   Collection Time: 11/26/20  5:01 PM  Result Value Ref Range   Glucose-Capillary 130 (H) 70 - 99 mg/dL    No results found for this or any previous visit (from the past 240 hour(s)).   Radiology Studies: No results found. VAS [place] LOWER EXTREMITY VENOUS (DVT)  Final Result  DG Chest Port 1 View  Final Result      Scheduled Meds: . acetaminophen  1,000 mg Oral QHS  . enoxaparin (LOVENOX) injection  100 mg Subcutaneous Q24H  . ferrous sulfate  325 mg Oral Daily  . hydrocortisone cream   Topical BID  . hydrOXYzine  10 mg Oral TID  . insulin aspart  0-20 Units Subcutaneous TID WC  . insulin aspart  0-5 Units Subcutaneous QHS  . insulin glargine  18 Units Subcutaneous Daily  . loratadine  10 mg Oral QPM   . magnesium oxide  400 mg Oral BID  . metoprolol tartrate  12.5 mg Oral BID  . nutrition supplement (JUVEN)  1 packet Oral BID BM  . potassium chloride  40 mEq Oral Daily  . Ensure Max Protein  11 oz Oral BID  . senna  1 tablet Oral BID   PRN Meds: sodium chloride, acetaminophen, bisacodyl, diclofenac Sodium, iohexol, methocarbamol, ondansetron **OR** ondansetron (ZOFRAN) IV, oxyCODONE Continuous Infusions: . sodium chloride Stopped (11/15/20 1816)     LOS: 40 days  Time spent: Greater than 50% of the 35 minute visit was spent in counseling/coordination of care for the patient as laid out in the A&P.   Dwyane Dee, MD [facility] 11/26/2020, 6:25 PM

## 2020-11-26 NOTE — Progress Notes (Signed)
Nutrition Follow-up  DOCUMENTATION CODES:   Morbid obesity  INTERVENTION:  - continue Juven BID and Ensure Max BID.  - will d/c Prosource Plus which is providing 100 kcal and 15 grams protein/day.    NUTRITION DIAGNOSIS:   Increased nutrient needs related to wound healing as evidenced by estimated needs. -ongoing  GOAL:   Patient will meet greater than or equal to 90% of their needs -met  MONITOR:   PO intake,Supplement acceptance,Labs,Weight trends,Skin  ASSESSMENT:   55 year old female with medical history of type 2 DM, morbid obesity, chronic lymphedema, chronic non-healing ulcers on both legs and calves and L upper thigh. She is followed by wound clinic and followed by [facility] 3 times per week until recently d/t insurance company no longer covering this service. She presented to the ED d/t worsening or wounds which have had increased drainage and pain. She is unable to change the dressings herself. At home, she did not walk but would move around the house in a rolling chair 1-2 times/day. She lives alone. Currently pending d/c to SNF but is a difficult placement d/t weight.  Diet changed from Heart Healthy, Carb Modified to FLD on 5/29 and back to Heart Healthy/Carb Modified yesterday after breakfast. Patient reports that she is the one that requested FLD be ordered d/t foods arriving cold and felt this would be beneficial. She also felt it may be beneficial in weight loss goal.  RN messaged RD yesterday to inform RD that RN was changing diet back and to request meal planning with patient.   Patient laying in bed at this time. Visit with patient was 45 minutes this AM.   Patient is highly motivated and is aware of need to be <450 lb in order to be discharged to SNF. She states that she was originally told early in hospitalization that she would need to be <500 lb, but that more recently she was informed of this number being lower.   She is ordered Prosource Plus  once/day, Juven BID, and Ensure Max BID. Patient likes and sees the benefit of these supplements and has been consuming them 100% of the time offered. This provides 590 kcal and 80 grams protein/day from oral nutrition supplements.   Patient does not like many of the foods available and has mainly been ordering the same thing for every breakfast, the same thing for every lunch, and the same thing for every dinner. She reports that RN discussed increasing vegetables, but she only likes broccoli and salad. Encouraged her to order these as often as she is able. Patient agreeable.   Breakfast is always 2 cups of grits with margarine, yogurt, and a diet ginger ale or cranberry juice.   Lunch is always a cheese burger (some days eats the bun and some days does not eat the bun) with lettuce and tomato, a side salad with fat free dressing, and a cookie or cup of ice cream.   Dinner is always roasted [place] with mashed potatoes and gravy, broccoli or a side salad, and juice or diet soda.  These meals provide a daily average of 1332 kcal and 72 grams protein.  Oral nutrition supplements + daily average from meals provides 1922 kcal and 152 grams protein.  Patient reports that she was informed that PT will only be working with her 2 days/week. She reports that she has been doing stretches and pulling herself up in bed in between therapy sessions.  Patient reports severe back pain and that she  receives pain medication prior to therapy, but that often pain medication begins to wear off before therapist is able to make it to her room to work with her.   She reports having her lymphedema pumps with her and that at the wound clinic she was encouraged to wear them for 1 hour in the AM and 1 hour in the PM, but that assistance to put pumps on is not consistent. This has led to increased BLE pain which has impacted her ability to work as hard as she would like during therapy sessions.   Weight yesterday was 478 lb.  Mild edema to BLE documented in the edema section of flow sheet.  She has 4 best friends who visit her. They have not been bringing in foods for patient as she has requested them not to. Previously, a friend brought in several boxes of 100 calorie pack unsalted nuts and patient has one package as a snack some, but not all, days.      Labs reviewed; CBG: 160 mg/dl.  Medications reviewed; 325 mg ferrous sulfate/day, sliding scale novolog, 18 units lantus/day, 400 mg mag-ox BID, 40 mEq Klor-Con/day, 1 tablet senokot BID.     Diet Order:   Diet Order            Diet heart healthy/carb modified Room service appropriate? Yes; Fluid consistency: Thin  Diet effective now                 EDUCATION NEEDS:   Education needs have been addressed  Skin:  Skin Assessment: Skin Integrity Issues: Skin Integrity Issues:: Other (Comment) Other: L thigh and L tibia pressure injuries  Last BM:  6/5 (type 2 x1)  Height:   Ht Readings from Last 1 Encounters:  11/15/20 '5\' 10"'  (1.778 m)    Weight:   Wt Readings from Last 1 Encounters:  11/25/20 (!) 216.8 kg     Estimated Nutritional Needs:  Kcal:  2000-2200 kcal Protein:  120-135 grams Fluid:  >/= 2 L/day      Jarome Matin, MS, RD, LDN, CNSC Inpatient Clinical Dietitian RD pager # available in [place]  After hours/weekend pager # available in [facility]

## 2020-11-27 LAB — CBC
HCT: 43.1 % (ref 36.0–46.0)
Hemoglobin: 12.8 g/dL (ref 12.0–15.0)
MCH: 28.5 pg (ref 26.0–34.0)
MCHC: 29.7 g/dL — ABNORMAL LOW (ref 30.0–36.0)
MCV: 96 fL (ref 80.0–100.0)
Platelets: 205 10*3/uL (ref 150–400)
RBC: 4.49 MIL/uL (ref 3.87–5.11)
RDW: 13.4 % (ref 11.5–15.5)
WBC: 5.7 10*3/uL (ref 4.0–10.5)
nRBC: 0 % (ref 0.0–0.2)

## 2020-11-27 LAB — COMPREHENSIVE METABOLIC PANEL
ALT: 15 U/L (ref 0–44)
AST: 16 U/L (ref 15–41)
Albumin: 3.4 g/dL — ABNORMAL LOW (ref 3.5–5.0)
Alkaline Phosphatase: 62 U/L (ref 38–126)
Anion gap: 7 (ref 5–15)
BUN: 22 mg/dL — ABNORMAL HIGH (ref 6–20)
CO2: 27 mmol/L (ref 22–32)
Calcium: 8.8 mg/dL — ABNORMAL LOW (ref 8.9–10.3)
Chloride: 102 mmol/L (ref 98–111)
Creatinine, Ser: 0.66 mg/dL (ref 0.44–1.00)
GFR, Estimated: >60 mL/min (ref >60)
Glucose, Bld: 193 mg/dL — ABNORMAL HIGH (ref 70–99)
Potassium: 4 mmol/L (ref 3.5–5.1)
Sodium: 136 mmol/L (ref 135–145)
Total Bilirubin: 0.6 mg/dL (ref 0.3–1.2)
Total Protein: 7.5 g/dL (ref 6.5–8.1)

## 2020-11-27 LAB — GLUCOSE, CAPILLARY
Glucose-Capillary: 169 mg/dL — ABNORMAL HIGH (ref 70–99)
Glucose-Capillary: 192 mg/dL — ABNORMAL HIGH (ref 70–99)
Glucose-Capillary: 129 mg/dL — ABNORMAL HIGH (ref 70–99)
Glucose-Capillary: 160 mg/dL — ABNORMAL HIGH (ref 70–99)

## 2020-11-27 LAB — PREALBUMIN: Prealbumin: 16.1 mg/dL — ABNORMAL LOW (ref 18–38)

## 2020-11-27 LAB — MAGNESIUM: Magnesium: 1.5 mg/dL — ABNORMAL LOW (ref 1.7–2.4)

## 2020-11-27 MED ORDER — RIVAROXABAN 10 MG PO TABS
10.0000 mg | ORAL_TABLET | Freq: Every day | ORAL | Status: DC
  Administered 2020-11-27 – 2020-12-27 (×31): 10 mg via ORAL
  Filled 2020-11-27 (×30): qty 1

## 2020-11-27 MED ORDER — MAGNESIUM OXIDE -MG SUPPLEMENT 400 (240 MG) MG PO TABS
800.0000 mg | ORAL_TABLET | Freq: Two times a day (BID) | ORAL | Status: DC
  Administered 2020-11-27 – 2020-12-27 (×61): 800 mg via ORAL
  Filled 2020-11-27 (×61): qty 2

## 2020-11-27 NOTE — Progress Notes (Signed)
Progress Note    [name]   ZJI:[id number]  DOB: Apr 20, 1966  DOA: 10/10/2020     43  PCP: Fanny Bien, MD  CC: skin breakdown  Hospital Course: 55 year old female with history of morbid obesity, diabetes mellitus type 2, essential hypertension, chronic lymphedema admitted and treated for skin breakdown.  Patient is currently nonambulatory, lives alone and unable to care for self.  Pending SNF placement.   Medically stable for discharge.  Interval History:  No reported events overnight.  She mentions that she is helping to not require insulin long-term.  We reviewed medication list and supplements being given.  Discussed that we would do a trial of modifying supplements if able and stopping insulin and monitoring glucose response, she was okay with this plan. She also endorsed not wanting to continue Lovenox shots if able.  Stated that we would try transitioning to oral Xarelto for ongoing DVT prophylaxis.  ROS: Constitutional: negative for chills and fevers, Respiratory: negative for cough, Cardiovascular: negative for chest pain and Gastrointestinal: negative for abdominal pain  Assessment & Plan: Chronic bilateral lower extremity lymphedema -- Stable, continue lymphedema pumps -Continue PT/OT  Diabetes mellitus type 2 -- CBG stable - A1c 6.5% - Discussed supplement regimen with RD as well - Trial of discontinuing Lantus and SSI.  Continue CBG monitoring  Essential hypertension --Diuretics discontinued. Stable, continue metoprolol.  Morbid obesity --Not interested in bariatric surgery  Disposition --Lives alone, unable to care for self, nonambulatory. Plan for SNF.  Social worker following. - target weight goal 450 lb prior to discharge to SNF   Old records reviewed in assessment of this patient  Antimicrobials:   DVT prophylaxis: rivaroxaban (XARELTO) tablet 10 mg   Code Status:   Code Status: Full Code Family Communication:    Disposition Plan: Status is: Inpatient  Remains inpatient appropriate because:Unsafe d/c plan  Dispo: The patient is from: Home              Anticipated d/c is to: SNF              Patient currently is medically stable to d/c.   Difficult to place patient Yes  Risk of unplanned readmission score: Unplanned Admission- Pilot do not use: 16.49   Objective: Blood pressure 125/71, pulse 90, temperature 98.5 F (36.9 C), temperature source Oral, resp. rate 18, height 5\' 10"  (1.778 m), weight (!) 216.8 kg, SpO2 95 %.  Examination: General appearance: alert, cooperative and no distress Head: Normocephalic, without obvious abnormality, atraumatic Eyes: EOMI Lungs: clear to auscultation bilaterally Heart: regular rate and rhythm and S1, S2 normal Abdomen: obese, soft, NT, ND, BS present but distant Extremities: obese; lymphedema noted Skin: mobility and turgor normal Neurologic: Grossly normal  Consultants:     Procedures:     Data Reviewed: I have personally reviewed following labs and imaging studies Results for orders placed or performed during the hospital encounter of 10/10/20 (from the past 24 hour(s))  Glucose, capillary     Status: Abnormal   Collection Time: 11/26/20  5:01 PM  Result Value Ref Range   Glucose-Capillary 130 (H) 70 - 99 mg/dL  Glucose, capillary     Status: Abnormal   Collection Time: 11/26/20  9:50 PM  Result Value Ref Range   Glucose-Capillary 185 (H) 70 - 99 mg/dL  Glucose, capillary     Status: Abnormal   Collection Time: 11/27/20  7:30 AM  Result Value Ref Range   Glucose-Capillary 169 (H) 70 - 99 mg/dL  CBC     Status: Abnormal   Collection Time: 11/27/20 11:08 AM  Result Value Ref Range   WBC 5.7 4.0 - 10.5 K/uL   RBC 4.49 3.87 - 5.11 MIL/uL   Hemoglobin 12.8 12.0 - 15.0 g/dL   HCT 43.1 36.0 - 46.0 %   MCV 96.0 80.0 - 100.0 fL   MCH 28.5 26.0 - 34.0 pg   MCHC 29.7 (L) 30.0 - 36.0 g/dL   RDW 13.4 11.5 - 15.5 %   Platelets 205 150 -  400 K/uL   nRBC 0.0 0.0 - 0.2 %  Magnesium     Status: Abnormal   Collection Time: 11/27/20 11:08 AM  Result Value Ref Range   Magnesium 1.5 (L) 1.7 - 2.4 mg/dL  Prealbumin     Status: Abnormal   Collection Time: 11/27/20 11:08 AM  Result Value Ref Range   Prealbumin 16.1 (L) 18 - 38 mg/dL  Comprehensive metabolic panel     Status: Abnormal   Collection Time: 11/27/20 11:08 AM  Result Value Ref Range   Sodium 136 135 - 145 mmol/L   Potassium 4.0 3.5 - 5.1 mmol/L   Chloride 102 98 - 111 mmol/L   CO2 27 22 - 32 mmol/L   Glucose, Bld 193 (H) 70 - 99 mg/dL   BUN 22 (H) 6 - 20 mg/dL   Creatinine, Ser 0.66 0.44 - 1.00 mg/dL   Calcium 8.8 (L) 8.9 - 10.3 mg/dL   Total Protein 7.5 6.5 - 8.1 g/dL   Albumin 3.4 (L) 3.5 - 5.0 g/dL   AST 16 15 - 41 U/L   ALT 15 0 - 44 U/L   Alkaline Phosphatase 62 38 - 126 U/L   Total Bilirubin 0.6 0.3 - 1.2 mg/dL   GFR, Estimated >60 >60 mL/min   Anion gap 7 5 - 15  Glucose, capillary     Status: Abnormal   Collection Time: 11/27/20 11:35 AM  Result Value Ref Range   Glucose-Capillary 192 (H) 70 - 99 mg/dL    No results found for this or any previous visit (from the past 240 hour(s)).   Radiology Studies: No results found. VAS [place] LOWER EXTREMITY VENOUS (DVT)  Final Result    DG Chest Port 1 View  Final Result      Scheduled Meds: . acetaminophen  1,000 mg Oral QHS  . ferrous sulfate  325 mg Oral Daily  . hydrocortisone cream   Topical BID  . hydrOXYzine  10 mg Oral TID  . loratadine  10 mg Oral QPM  . magnesium oxide  400 mg Oral BID  . metoprolol tartrate  12.5 mg Oral BID  . nutrition supplement (JUVEN)  1 packet Oral BID BM  . potassium chloride  40 mEq Oral Daily  . Ensure Max Protein  11 oz Oral BID  . rivaroxaban  10 mg Oral Daily  . senna  1 tablet Oral BID   PRN Meds: sodium chloride, acetaminophen, bisacodyl, diclofenac Sodium, iohexol, methocarbamol, ondansetron **OR** ondansetron (ZOFRAN) IV, oxyCODONE Continuous  Infusions: . sodium chloride Stopped (11/15/20 1816)     LOS: 41 days  Time spent: Greater than 50% of the 35 minute visit was spent in counseling/coordination of care for the patient as laid out in the A&P.   Dwyane Dee, MD [facility] 11/27/2020, 2:22 PM

## 2020-11-27 NOTE — Progress Notes (Signed)
NUTRITION NOTE  Consult received for suggestions of low carb oral nutrition supplements.   Patient is currently ordered Ensure Max BID, each bottles contains 150 kcal, 6 grams carb, and 30 grams protein.  She is also ordered Juven BID, each packet provides 95 kcal, 10 grams carb, and 2.5 grams protein.  From oral nutrition supplements, she is consuming 32 grams of carb/day.      Jarome Matin, MS, RD, LDN, CNSC Inpatient Clinical Dietitian RD pager # available in [place]  After hours/weekend pager # available in [facility]

## 2020-11-28 LAB — GLUCOSE, CAPILLARY
Glucose-Capillary: 142 mg/dL — ABNORMAL HIGH (ref 70–99)
Glucose-Capillary: 192 mg/dL — ABNORMAL HIGH (ref 70–99)
Glucose-Capillary: 170 mg/dL — ABNORMAL HIGH (ref 70–99)
Glucose-Capillary: 140 mg/dL — ABNORMAL HIGH (ref 70–99)

## 2020-11-28 MED ORDER — POTASSIUM CHLORIDE 20 MEQ PO PACK
40.0000 meq | PACK | Freq: Every day | ORAL | Status: DC
  Administered 2020-11-29 – 2020-12-08 (×10): 40 meq via ORAL
  Filled 2020-11-28 (×10): qty 2

## 2020-11-28 NOTE — Plan of Care (Signed)
  Problem: Pain Managment: Goal: General experience of comfort will improve Outcome: Progressing   Problem: Safety: Goal: Ability to remain free from injury will improve Outcome: Progressing   

## 2020-11-28 NOTE — Progress Notes (Signed)
Physical Therapy Treatment Patient Details Name: [name] MRN: [id number] DOB: November 27, 1965 Today's Date: 11/28/2020    History of Present Illness Pt admitted from home with multiple LE wounds, and FTT and unable to perform basic self care.  Pt reports she stayed in lift chair getting up only twice a day for BM or getting food.  Pt utilizing Rollator around home but pivoted to it from lift chair and wheeled around in seated position.  Pt states she put pads and diapers in chair so she did not have to get up to urinate.  Pt with hx of DM and lymphadema (has been unable to use her pressure sleeves so lymphadema has significantly worsened since last December).    PT Comments    Pt  continues to cooperate but progress is slow.  Pt on tilt table with max tilt of 53 degrees but tolerating for 3 minutes only.  Pt in tilt total of 18 min ranging from 32 - 53 degrees dependent on LE pain and fatigue.  Pt performing quad sets, reaching and tossing while in tilt and then moved bed to chair position to work trunk with pt leaning/pulling forward x1o - limited by c/o back pain.  Pt declines OOB to chair stating she feels impending BM and does not want to be in chair when it occurs.    Follow Up Recommendations  SNF     Equipment Recommendations       Recommendations for Other Services OT consult     Precautions / Restrictions Precautions Precautions: Fall Precaution Comments: posterior and medial LLE wounds, place bed pad  to cushion when maxiskying. Restrictions Weight Bearing Restrictions: No    Mobility  Bed Mobility                 Start Time: 1147 Angle: 53 degrees Total Minutes in Angle: 3 minutes (total of 18 min at tilt but ranged from 32 to 53 degrees) Patient Response: Anxious;Cooperative  Transfers                    Ambulation/Gait                 Stairs             Wheelchair Mobility    Modified Rankin (Stroke Patients Only)        Balance                                            Cognition Arousal/Alertness: Awake/alert Behavior During Therapy: WFL for tasks assessed/performed Overall Cognitive Status: Within Functional Limits for tasks assessed                                 General Comments: able to work through tilting  when pain meds were due      Exercises General Exercises - Lower Extremity Ankle Circles/Pumps: AROM;Both;10 reps;Limitations Quad Sets: AROM;Both;Limitations;10 reps Quad Sets Limitations: pain    General Comments        Pertinent Vitals/Pain Pain Assessment: Faces Faces Pain Scale: Hurts whole lot Pain Location: knees more than hips Pain Descriptors / Indicators: Aching;Discomfort;Moaning Pain Intervention(s): Limited activity within patient's tolerance;Monitored during session;Premedicated before session;Heat applied    Home Living  Prior Function            PT Goals (current goals can now be found in the care plan section) Acute Rehab PT Goals Patient Stated Goal: manage myself and go home PT Goal Formulation: With patient Time For Goal Achievement: 12/12/20 Potential to Achieve Goals: Fair Progress towards PT goals: Progressing toward goals    Frequency    Min 2X/week      PT Plan Current plan remains appropriate    Co-evaluation              AM-PAC PT "6 Clicks" Mobility   Outcome Measure  Help needed turning from your back to your side while in a flat bed without using bedrails?: Total Help needed moving from lying on your back to sitting on the side of a flat bed without using bedrails?: Total Help needed moving to and from a bed to a chair (including a wheelchair)?: Total Help needed standing up from a chair using your arms (e.g., wheelchair or bedside chair)?: Total Help needed to walk in hospital room?: Total Help needed climbing 3-5 steps with a railing? : Total 6 Click Score:  6    End of Session Equipment Utilized During Treatment: Other (comment) (tilt bed) Activity Tolerance: Patient tolerated treatment well;Patient limited by fatigue;Patient limited by pain Patient left: Other (comment) (tilt bed in chair position) Nurse Communication: Mobility status;Need for lift equipment PT Visit Diagnosis: Muscle weakness (generalized) (M62.81);Difficulty in walking, not elsewhere classified (R26.2);Pain;Adult, failure to thrive (R62.7) Pain - Right/Left: Right Pain - part of body: Knee;Leg;Hip     Time: 1132-1221 PT Time Calculation (min) (ACUTE ONLY): 49 min  Charges:  $Therapeutic Activity: 38-52 mins                     Debe Coder PT Acute Rehabilitation Services Pager [phone] Office [phone]    Norris Brumbach 11/28/2020, 1:00 PM

## 2020-11-28 NOTE — Progress Notes (Signed)
Progress Note    [name]   OZD:[id number]  DOB: 1965/12/12  DOA: 10/10/2020     44  PCP: Fanny Bien, MD  CC: skin breakdown  Hospital Course: 55 year old female with history of morbid obesity, diabetes mellitus type 2, essential hypertension, chronic lymphedema admitted and treated for skin breakdown.  Patient is currently nonambulatory, lives alone and unable to care for self.  Pending SNF placement.   Medically stable for discharge.  Interval History:  No reported events overnight.  She appreciates no further shocks at this time.  Glucose levels have been stable after stopping insulin for now.  She understands we will continue to check CBGs.  ROS: Constitutional: negative for chills and fevers, Respiratory: negative for cough, Cardiovascular: negative for chest pain and Gastrointestinal: negative for abdominal pain  Assessment & Plan: Chronic bilateral lower extremity lymphedema -- Stable, continue lymphedema pumps -Continue PT/OT   Diabetes mellitus type 2 -- CBG stable - A1c 6.5% - Discussed supplement regimen with RD as well - Trial of discontinuing Lantus and SSI.  Continue CBG monitoring; so far holding stable   Essential hypertension --Diuretics discontinued.  Stable, continue metoprolol.   Morbid obesity --Not interested in bariatric surgery   Disposition --Lives alone, unable to care for self, nonambulatory.  Plan for SNF.  Social worker following. - target weight goal 450 lb prior to discharge to SNF   Old records reviewed in assessment of this patient  Antimicrobials:   DVT prophylaxis: rivaroxaban (XARELTO) tablet 10 mg   Code Status:   Code Status: Full Code Family Communication:   Disposition Plan: Status is: Inpatient  Remains inpatient appropriate because:Unsafe d/c plan  Dispo: The patient is from: Home              Anticipated d/c is to: SNF              Patient currently is medically stable to d/c.   Difficult to  place patient Yes  Risk of unplanned readmission score: Unplanned Admission- Pilot do not use: 16.49   Objective: Blood pressure 133/79, pulse 86, temperature 98.4 F (36.9 C), temperature source Oral, resp. rate 16, height 5\' 10"  (1.778 m), weight (!) 215 kg, SpO2 94 %.  Examination: General appearance: alert, cooperative and no distress Head: Normocephalic, without obvious abnormality, atraumatic Eyes:  EOMI Lungs: clear to auscultation bilaterally Heart: regular rate and rhythm and S1, S2 normal Abdomen:  obese, soft, NT, ND, BS present but distant Extremities:  obese; lymphedema noted Skin:  Chronic xerosis and indurated skin Neurologic: Grossly normal  Consultants:    Procedures:    Data Reviewed: I have personally reviewed following labs and imaging studies Results for orders placed or performed during the hospital encounter of 10/10/20 (from the past 24 hour(s))  Glucose, capillary     Status: Abnormal   Collection Time: 11/27/20  5:31 PM  Result Value Ref Range   Glucose-Capillary 129 (H) 70 - 99 mg/dL  Glucose, capillary     Status: Abnormal   Collection Time: 11/27/20  8:43 PM  Result Value Ref Range   Glucose-Capillary 160 (H) 70 - 99 mg/dL  Glucose, capillary     Status: Abnormal   Collection Time: 11/28/20  7:34 AM  Result Value Ref Range   Glucose-Capillary 142 (H) 70 - 99 mg/dL  Glucose, capillary     Status: Abnormal   Collection Time: 11/28/20 12:37 PM  Result Value Ref Range   Glucose-Capillary 192 (H) 70 - 99 mg/dL  No results found for this or any previous visit (from the past 240 hour(s)).   Radiology Studies: No results found. VAS [place] LOWER EXTREMITY VENOUS (DVT)  Final Result    DG Chest Port 1 View  Final Result      Scheduled Meds:  acetaminophen  1,000 mg Oral QHS   ferrous sulfate  325 mg Oral Daily   hydrocortisone cream   Topical BID   hydrOXYzine  10 mg Oral TID   loratadine  10 mg Oral QPM   magnesium oxide  800 mg Oral BID    metoprolol tartrate  12.5 mg Oral BID   nutrition supplement (JUVEN)  1 packet Oral BID BM   [START ON 11/29/2020] potassium chloride  40 mEq Oral Daily   Ensure Max Protein  11 oz Oral BID   rivaroxaban  10 mg Oral Daily   senna  1 tablet Oral BID   PRN Meds: sodium chloride, acetaminophen, bisacodyl, diclofenac Sodium, iohexol, methocarbamol, ondansetron **OR** ondansetron (ZOFRAN) IV, oxyCODONE Continuous Infusions:  sodium chloride Stopped (11/15/20 1816)     LOS: 42 days  Time spent: Greater than 50% of the 35 minute visit was spent in counseling/coordination of care for the patient as laid out in the A&P.   Dwyane Dee, MD [facility] 11/28/2020, 4:16 PM

## 2020-11-29 LAB — GLUCOSE, CAPILLARY
Glucose-Capillary: 156 mg/dL — ABNORMAL HIGH (ref 70–99)
Glucose-Capillary: 167 mg/dL — ABNORMAL HIGH (ref 70–99)
Glucose-Capillary: 213 mg/dL — ABNORMAL HIGH (ref 70–99)
Glucose-Capillary: 185 mg/dL — ABNORMAL HIGH (ref 70–99)

## 2020-11-29 NOTE — Plan of Care (Signed)
  Problem: Nutrition: Goal: Adequate nutrition will be maintained Outcome: Progressing   Problem: Pain Managment: Goal: General experience of comfort will improve Outcome: Progressing   Problem: Safety: Goal: Ability to remain free from injury will improve Outcome: Progressing   

## 2020-11-29 NOTE — Progress Notes (Signed)
Progress Note    [name]   [id number]  DOB: 09/29/1965  DOA: 10/10/2020     45  PCP: Fanny Bien, MD  CC: skin breakdown  Hospital Course: 55 year old female with history of morbid obesity, diabetes mellitus type 2, essential hypertension, chronic lymphedema admitted and treated for skin breakdown.  Patient is currently nonambulatory, lives alone and unable to care for self.  Pending SNF placement.   Medically stable for discharge.  Interval History:  No reported events overnight.  She appreciates no further insulin shots at this time.  Glucose levels have been stable after stopping insulin for now.  She understands we will continue to check CBGs. Otherwise doing well when seen this morning.  Encouraged her to work with her exercise straps for stretching when in bed.  ROS: Constitutional: negative for chills and fevers, Respiratory: negative for cough, Cardiovascular: negative for chest pain and Gastrointestinal: negative for abdominal pain  Assessment & Plan: Chronic bilateral lower extremity lymphedema -- Stable, continue lymphedema pumps -Continue PT/OT   Diabetes mellitus type 2 -- CBG stable - A1c 6.5% - Discussed supplement regimen with RD as well - Trial of discontinuing Lantus and SSI.  Continue CBG monitoring; so far holding stable   Essential hypertension --Diuretics discontinued.  Stable, continue metoprolol.   Morbid obesity --Not interested in bariatric surgery   Disposition --Lives alone, unable to care for self, nonambulatory.  Plan for SNF.  Social worker following. - target weight goal 450 lb prior to discharge to SNF   Old records reviewed in assessment of this patient  Antimicrobials:   DVT prophylaxis: rivaroxaban (XARELTO) tablet 10 mg   Code Status:   Code Status: Full Code Family Communication:   Disposition Plan: Status is: Inpatient  Remains inpatient appropriate because:Unsafe d/c plan  Dispo: The patient is  from: Home              Anticipated d/c is to: SNF              Patient currently is medically stable to d/c.   Difficult to place patient Yes  Risk of unplanned readmission score: Unplanned Admission- Pilot do not use: 16.49   Objective: Blood pressure 125/76, pulse 94, temperature 98.3 F (36.8 C), temperature source Oral, resp. rate 17, height 5\' 10"  (1.778 m), weight (!) 221 kg, SpO2 91 %.  Examination: General appearance: alert, cooperative and no distress Head: Normocephalic, without obvious abnormality, atraumatic Eyes:  EOMI Lungs: clear to auscultation bilaterally Heart: regular rate and rhythm and S1, S2 normal Abdomen:  obese, soft, NT, ND, BS present but distant Extremities:  obese; lymphedema noted Skin:  Chronic xerosis and indurated skin Neurologic: Grossly normal  Consultants:    Procedures:    Data Reviewed: I have personally reviewed following labs and imaging studies Results for orders placed or performed during the hospital encounter of 10/10/20 (from the past 24 hour(s))  Glucose, capillary     Status: Abnormal   Collection Time: 11/28/20  4:20 PM  Result Value Ref Range   Glucose-Capillary 170 (H) 70 - 99 mg/dL  Glucose, capillary     Status: Abnormal   Collection Time: 11/28/20 10:43 PM  Result Value Ref Range   Glucose-Capillary 140 (H) 70 - 99 mg/dL  Glucose, capillary     Status: Abnormal   Collection Time: 11/29/20  7:31 AM  Result Value Ref Range   Glucose-Capillary 156 (H) 70 - 99 mg/dL  Glucose, capillary     Status: Abnormal  Collection Time: 11/29/20 11:56 AM  Result Value Ref Range   Glucose-Capillary 167 (H) 70 - 99 mg/dL    No results found for this or any previous visit (from the past 240 hour(s)).   Radiology Studies: No results found. VAS [place] LOWER EXTREMITY VENOUS (DVT)  Final Result    DG Chest Port 1 View  Final Result      Scheduled Meds:  acetaminophen  1,000 mg Oral QHS   ferrous sulfate  325 mg Oral Daily    hydrocortisone cream   Topical BID   hydrOXYzine  10 mg Oral TID   loratadine  10 mg Oral QPM   magnesium oxide  800 mg Oral BID   metoprolol tartrate  12.5 mg Oral BID   nutrition supplement (JUVEN)  1 packet Oral BID BM   potassium chloride  40 mEq Oral Daily   Ensure Max Protein  11 oz Oral BID   rivaroxaban  10 mg Oral Daily   senna  1 tablet Oral BID   PRN Meds: sodium chloride, acetaminophen, bisacodyl, diclofenac Sodium, iohexol, methocarbamol, ondansetron **OR** ondansetron (ZOFRAN) IV, oxyCODONE Continuous Infusions:  sodium chloride Stopped (11/15/20 1816)     LOS: 43 days  Time spent: Greater than 50% of the 35 minute visit was spent in counseling/coordination of care for the patient as laid out in the A&P.   Dwyane Dee, MD [facility] 11/29/2020, 1:35 PM

## 2020-11-29 NOTE — Progress Notes (Signed)
Occupational Therapy Treatment Patient Details Name: [name] MRN: [id number] DOB: August 28, 1965 Today's Date: 11/29/2020    History of present illness Pt admitted from home with multiple LE wounds, and FTT and unable to perform basic self care.  Pt reports she stayed in lift chair getting up only twice a day for BM or getting food.  Pt utilizing Rollator around home but pivoted to it from lift chair and wheeled around in seated position.  Pt states she put pads and diapers in chair so she did not have to get up to urinate.  Pt with hx of DM and lymphadema (has been unable to use her pressure sleeves so lymphadema has significantly worsened since last December).   OT comments  Treatment focused on reiteration of UE HEP. Patient performed 10 reps of each exercise on each arm. Patient reports compliance with HEP ( 3 x a day) - but seems to not know how they are performed. Therapist went over each exercise and which bands to use and to limit ROM to before pain. Patient verbalized understanding. Patient has been seen by therapy since 4/28 and goals had to be down graded. Patient's functional status has remained the same since evaluation - able to assist with UB ADLs at bed level and max-total assist for LB ADLs. Goal for rolling went unmet without any significant change - limited by poor hip mobility, poor knee mobility and pain. Patient still requires was +2 -3 assistance for bed level ADLs and bed mobility. Patient provided with HEP to maintain upper body strength so she can assist with rolling and pulling up in bed using arms. Patient verbalizes understanding of HEP and POC. OT will sign off at this time. If patient makes any significant improvement with functional mobility to allow for improved participation with ADLs please re-consult OT. Patient is dependent on caregivers. Recommend long term care and 24/7 assistance at discharge.    Follow Up Recommendations  Other (comment) (long term care)     Equipment Recommendations       Recommendations for Other Services      Precautions / Restrictions Precautions Precautions: Fall Precaution Comments: posterior and medial LLE wounds, place bed pad  to cushion when maxiskying.       Mobility Bed Mobility                    Transfers                      Balance                                           ADL either performed or assessed with clinical judgement   ADL                                               Vision       Perception     Praxis      Cognition                                                Exercises Other Exercises  Other Exercises: Reiteration of HEP including bicep curls, chest press, shoulder press, internal and external shoulder rotation, and horizontal abduction. patient performed 10 reps with each extremity of each exercise.   Shoulder Instructions       General Comments      Pertinent Vitals/ Pain          Home Living                                          Prior Functioning/Environment              Frequency           Progress Toward Goals  OT Goals(current goals can now be found in the care plan section)  Progress towards OT goals: Not progressing toward goals - comment;Goals met/education completed, patient discharged from OT  Acute Rehab OT Goals OT Goal Formulation: All assessment and education complete, DC therapy  Plan Other (comment) (Lack of progress, not meeting goals - discharged from OT services)    Co-evaluation          OT goals addressed during session: Strengthening/ROM      AM-PAC OT "6 Clicks" Daily Activity     Outcome Measure   Help from another person eating meals?: None Help from another person taking care of personal grooming?: A Little Help from another person toileting, which includes using toliet, bedpan, or urinal?: Total Help from  another person bathing (including washing, rinsing, drying)?: A Lot Help from another person to put on and taking off regular upper body clothing?: A Lot Help from another person to put on and taking off regular lower body clothing?: Total 6 Click Score: 13    End of Session    OT Visit Diagnosis: Other abnormalities of gait and mobility (R26.89);Muscle weakness (generalized) (M62.81);Pain   Activity Tolerance Patient tolerated treatment well   Patient Left in bed;with call bell/phone within reach   Nurse Communication Mobility status        Time: 4949-4473 OT Time Calculation (min): 17 min  Charges: OT General Charges $OT Visit: 1 Visit OT Treatments $Therapeutic Exercise: 8-22 mins  Derl Barrow, OTR/L [place]  Office [phone] Pager: [place] 11/29/2020, 12:57 PM

## 2020-11-30 LAB — GLUCOSE, CAPILLARY
Glucose-Capillary: 173 mg/dL — ABNORMAL HIGH (ref 70–99)
Glucose-Capillary: 199 mg/dL — ABNORMAL HIGH (ref 70–99)
Glucose-Capillary: 173 mg/dL — ABNORMAL HIGH (ref 70–99)
Glucose-Capillary: 168 mg/dL — ABNORMAL HIGH (ref 70–99)

## 2020-11-30 MED ORDER — SITAGLIPTIN PHOSPHATE 25 MG PO TABS
100.0000 mg | ORAL_TABLET | Freq: Every day | ORAL | Status: DC
  Administered 2020-11-30 – 2020-12-03 (×4): 100 mg via ORAL
  Filled 2020-11-30 (×4): qty 4

## 2020-11-30 NOTE — Progress Notes (Signed)
Progress Note    [name]   [id number]:[id number]  DOB: 05-13-1966  DOA: 10/10/2020     46  PCP: Fanny Bien, MD  CC: skin breakdown  Hospital Course: 55 year old female with history of morbid obesity, diabetes mellitus type 2, essential hypertension, chronic lymphedema admitted and treated for skin breakdown.  Patient is currently nonambulatory, lives alone and unable to care for self.  Pending SNF placement.   Medically stable for discharge.  Interval History:  Doing well this morning. Discussed oral options for diabetes control.  She did not tolerate metformin in the past due to GI side effects.  She is amenable for other treatment options.  She is still happy to be off of insulin shots for now.  ROS: Constitutional: negative for chills and fevers, Respiratory: negative for cough, Cardiovascular: negative for chest pain and Gastrointestinal: negative for abdominal pain  Assessment & Plan: Chronic bilateral lower extremity lymphedema -- Stable, continue lymphedema pumps -Continue PT/OT   Diabetes mellitus type 2 - A1c 6.5% - Discussed supplement regimen with RD as well - Trial of discontinuing Lantus and SSI.  Continue CBG monitoring; so far holding stable -Preference is to start her on Januvia (better bioavailability than Tradjenta).  Trial of Januvia for the next 2 days, if glucose levels respond fairly well, will send prescription to her outpatient pharmacy to then be brought in for dispensing while in the hospital since [place] not on formulary  Essential hypertension --Diuretics discontinued.  Stable, continue metoprolol.   Morbid obesity --Not interested in bariatric surgery   Disposition --Lives alone, unable to care for self, nonambulatory.  Plan for SNF.  Social worker following. - target weight goal 450 lb prior to discharge to SNF   Old records reviewed in assessment of this patient  Antimicrobials:   DVT prophylaxis: rivaroxaban (XARELTO)  tablet 10 mg   Code Status:   Code Status: Full Code Family Communication:   Disposition Plan: Status is: Inpatient  Remains inpatient appropriate because:Unsafe d/c plan  Dispo: The patient is from: Home              Anticipated d/c is to: SNF              Patient currently is medically stable to d/c.   Difficult to place patient Yes  Risk of unplanned readmission score: Unplanned Admission- Pilot do not use: 12.8   Objective: Blood pressure (!) 149/81, pulse 96, temperature 98.3 F (36.8 C), temperature source Oral, resp. rate 15, height 5\' 10"  (1.778 m), weight (!) 216.7 kg, SpO2 90 %.  Examination: General appearance: alert, cooperative and no distress Head: Normocephalic, without obvious abnormality, atraumatic Eyes:  EOMI Lungs: clear to auscultation bilaterally Heart: regular rate and rhythm and S1, S2 normal Abdomen:  obese, soft, NT, ND, BS present but distant Extremities:  obese; lymphedema noted Skin:  Chronic xerosis and indurated skin Neurologic: Grossly normal  Consultants:    Procedures:    Data Reviewed: I have personally reviewed following labs and imaging studies Results for orders placed or performed during the hospital encounter of 10/10/20 (from the past 24 hour(s))  Glucose, capillary     Status: Abnormal   Collection Time: 11/29/20  4:59 PM  Result Value Ref Range   Glucose-Capillary 213 (H) 70 - 99 mg/dL  Glucose, capillary     Status: Abnormal   Collection Time: 11/29/20 10:10 PM  Result Value Ref Range   Glucose-Capillary 185 (H) 70 - 99 mg/dL  Glucose, capillary  Status: Abnormal   Collection Time: 11/30/20  7:54 AM  Result Value Ref Range   Glucose-Capillary 173 (H) 70 - 99 mg/dL  Glucose, capillary     Status: Abnormal   Collection Time: 11/30/20 11:38 AM  Result Value Ref Range   Glucose-Capillary 199 (H) 70 - 99 mg/dL    No results found for this or any previous visit (from the past 240 hour(s)).   Radiology Studies: No  results found. VAS [place] LOWER EXTREMITY VENOUS (DVT)  Final Result    DG Chest Port 1 View  Final Result      Scheduled Meds:  acetaminophen  1,000 mg Oral QHS   ferrous sulfate  325 mg Oral Daily   hydrocortisone cream   Topical BID   hydrOXYzine  10 mg Oral TID   loratadine  10 mg Oral QPM   magnesium oxide  800 mg Oral BID   metoprolol tartrate  12.5 mg Oral BID   nutrition supplement (JUVEN)  1 packet Oral BID BM   potassium chloride  40 mEq Oral Daily   Ensure Max Protein  11 oz Oral BID   rivaroxaban  10 mg Oral Daily   senna  1 tablet Oral BID   sitaGLIPtin  100 mg Oral Daily   PRN Meds: sodium chloride, acetaminophen, bisacodyl, diclofenac Sodium, iohexol, methocarbamol, ondansetron **OR** ondansetron (ZOFRAN) IV, oxyCODONE Continuous Infusions:  sodium chloride Stopped (11/15/20 1816)     LOS: 44 days  Time spent: Greater than 50% of the 35 minute visit was spent in counseling/coordination of care for the patient as laid out in the A&P.   Dwyane Dee, MD [facility] 11/30/2020, 1:45 PM

## 2020-11-30 NOTE — Plan of Care (Signed)
  Problem: Coping: Goal: Level of anxiety will decrease Outcome: Progressing   Problem: Pain Managment: Goal: General experience of comfort will improve Outcome: Progressing   Problem: Safety: Goal: Ability to remain free from injury will improve Outcome: Progressing   

## 2020-11-30 NOTE — Plan of Care (Signed)
  Problem: Clinical Measurements: Goal: Diagnostic test results will improve Outcome: Progressing   Problem: Clinical Measurements: Goal: Cardiovascular complication will be avoided Outcome: Progressing   Problem: Activity: Goal: Risk for activity intolerance will decrease Outcome: Progressing   Problem: Elimination: Goal: Will not experience complications related to bowel motility Outcome: Progressing   Problem: Elimination: Goal: Will not experience complications related to urinary retention Outcome: Progressing   Problem: Skin Integrity: Goal: Risk for impaired skin integrity will decrease Outcome: Progressing

## 2020-12-01 LAB — GLUCOSE, CAPILLARY
Glucose-Capillary: 198 mg/dL — ABNORMAL HIGH (ref 70–99)
Glucose-Capillary: 186 mg/dL — ABNORMAL HIGH (ref 70–99)
Glucose-Capillary: 185 mg/dL — ABNORMAL HIGH (ref 70–99)
Glucose-Capillary: 187 mg/dL — ABNORMAL HIGH (ref 70–99)

## 2020-12-01 MED ORDER — SITAGLIPTIN PHOSPHATE 100 MG PO TABS: 100.0000 mg | ORAL_TABLET | Freq: Every day | ORAL | 0 refills | Status: AC

## 2020-12-01 NOTE — Plan of Care (Signed)
  Problem: Coping: Goal: Level of anxiety will decrease Outcome: Progressing   Problem: Pain Managment: Goal: General experience of comfort will improve Outcome: Progressing   

## 2020-12-01 NOTE — Progress Notes (Signed)
Progress Note    [name]   [id number]  DOB: 1965/11/29  DOA: 10/10/2020     55  PCP: Fanny Bien, MD  CC: skin breakdown  Hospital Course: 55 year old female with history of morbid obesity, diabetes mellitus type 2, essential hypertension, chronic lymphedema admitted and treated for skin breakdown.  Patient is currently nonambulatory, lives alone and unable to care for self.  Pending SNF placement.   Medically stable for discharge.  Interval History:  Doing well this morning. No events overnight.  Able to have someone bring in the [place] from pharmacy once it is prescribed.  ROS: Constitutional: negative for chills and fevers, Respiratory: negative for cough, Cardiovascular: negative for chest pain and Gastrointestinal: negative for abdominal pain  Assessment & Plan: Chronic bilateral lower extremity lymphedema -- Stable, continue lymphedema pumps -Continue PT/OT   Diabetes mellitus type 2 - A1c 6.5% - Discussed supplement regimen with RD as well - Trial of discontinuing Lantus and SSI.  Continue CBG monitoring; so far holding stable -Preference is to start her on Januvia (better bioavailability than Tradjenta).  Trial of Januvia for the next 2 days, if glucose levels respond fairly well, will send prescription to her outpatient pharmacy to then be brought in for dispensing while in the hospital since [place] not on formulary  Essential hypertension --Diuretics discontinued.  Stable, continue metoprolol.   Morbid obesity --Not interested in bariatric surgery   Disposition --Lives alone, unable to care for self, nonambulatory.  Plan for SNF.  Social worker following. - target weight goal 450 lb prior to discharge to SNF   Old records reviewed in assessment of this patient  Antimicrobials:   DVT prophylaxis: rivaroxaban (XARELTO) tablet 10 mg   Code Status:   Code Status: Full Code Family Communication:   Disposition Plan: Status is:  Inpatient  Remains inpatient appropriate because:Unsafe d/c plan  Dispo: The patient is from: Home              Anticipated d/c is to: SNF              Patient currently is medically stable to d/c.   Difficult to place patient Yes  Risk of unplanned readmission score: Unplanned Admission- Pilot do not use: 12.94   Objective: Blood pressure 114/74, pulse 92, temperature 98.7 F (37.1 C), temperature source Oral, resp. rate 18, height 5\' 10"  (1.778 m), weight (!) 216.7 kg, SpO2 94 %.  Examination: General appearance: alert, cooperative and no distress Head: Normocephalic, without obvious abnormality, atraumatic Eyes:  EOMI Lungs: clear to auscultation bilaterally Heart: regular rate and rhythm and S1, S2 normal Abdomen:  obese, soft, NT, ND, BS present but distant Extremities:  obese; lymphedema noted Skin:  Chronic xerosis and indurated skin Neurologic: Grossly normal  Consultants:    Procedures:    Data Reviewed: I have personally reviewed following labs and imaging studies Results for orders placed or performed during the hospital encounter of 10/10/20 (from the past 24 hour(s))  Glucose, capillary     Status: Abnormal   Collection Time: 11/30/20  4:31 PM  Result Value Ref Range   Glucose-Capillary 173 (H) 70 - 99 mg/dL  Glucose, capillary     Status: Abnormal   Collection Time: 11/30/20  9:38 PM  Result Value Ref Range   Glucose-Capillary 168 (H) 70 - 99 mg/dL  Glucose, capillary     Status: Abnormal   Collection Time: 12/01/20  7:12 AM  Result Value Ref Range   Glucose-Capillary 198 (H)  70 - 99 mg/dL  Glucose, capillary     Status: Abnormal   Collection Time: 12/01/20 12:03 PM  Result Value Ref Range   Glucose-Capillary 186 (H) 70 - 99 mg/dL    No results found for this or any previous visit (from the past 240 hour(s)).   Radiology Studies: No results found. VAS [place] LOWER EXTREMITY VENOUS (DVT)  Final Result    DG Chest Port 1 View  Final Result       Scheduled Meds:  acetaminophen  1,000 mg Oral QHS   ferrous sulfate  325 mg Oral Daily   hydrocortisone cream   Topical BID   hydrOXYzine  10 mg Oral TID   loratadine  10 mg Oral QPM   magnesium oxide  800 mg Oral BID   metoprolol tartrate  12.5 mg Oral BID   nutrition supplement (JUVEN)  1 packet Oral BID BM   potassium chloride  40 mEq Oral Daily   Ensure Max Protein  11 oz Oral BID   rivaroxaban  10 mg Oral Daily   senna  1 tablet Oral BID   sitaGLIPtin  100 mg Oral Daily   PRN Meds: sodium chloride, acetaminophen, bisacodyl, diclofenac Sodium, iohexol, methocarbamol, ondansetron **OR** ondansetron (ZOFRAN) IV, oxyCODONE Continuous Infusions:  sodium chloride Stopped (11/15/20 1816)     LOS: 45 days  Time spent: Greater than 50% of the 35 minute visit was spent in counseling/coordination of care for the patient as laid out in the A&P.   Dwyane Dee, MD [facility] 12/01/2020, 3:28 PM

## 2020-12-01 NOTE — Plan of Care (Signed)
  Problem: Clinical Measurements: Goal: Cardiovascular complication will be avoided Outcome: Progressing   Problem: Clinical Measurements: Goal: Diagnostic test results will improve Outcome: Progressing   Problem: Activity: Goal: Risk for activity intolerance will decrease Outcome: Progressing   Problem: Elimination: Goal: Will not experience complications related to bowel motility Outcome: Progressing   Problem: Skin Integrity: Goal: Risk for impaired skin integrity will decrease Outcome: Progressing

## 2020-12-01 NOTE — Plan of Care (Signed)
  Problem: Coping: Goal: Level of anxiety will decrease Outcome: Progressing   Problem: Pain Managment: Goal: General experience of comfort will improve Outcome: Progressing   Problem: Safety: Goal: Ability to remain free from injury will improve Outcome: Progressing   

## 2020-12-02 LAB — GLUCOSE, CAPILLARY
Glucose-Capillary: 188 mg/dL — ABNORMAL HIGH (ref 70–99)
Glucose-Capillary: 231 mg/dL — ABNORMAL HIGH (ref 70–99)
Glucose-Capillary: 148 mg/dL — ABNORMAL HIGH (ref 70–99)
Glucose-Capillary: 179 mg/dL — ABNORMAL HIGH (ref 70–99)

## 2020-12-02 NOTE — Progress Notes (Signed)
Progress Note    [name]   [id number]  DOB: 1966/05/20  DOA: 10/10/2020     48  PCP: Fanny Bien, MD  CC: skin breakdown  Hospital Course: 55 year old female with history of morbid obesity, diabetes mellitus type 2, essential hypertension, chronic lymphedema admitted and treated for skin breakdown.  Patient is currently nonambulatory, lives alone and unable to care for self.  Pending SNF placement.   Medically stable for discharge.  Interval History:  Doing well this morning. No events overnight. Says someone will be brining in her Januvia that was sent to her pharmacy.   ROS: Constitutional: negative for chills and fevers, Respiratory: negative for cough, Cardiovascular: negative for chest pain and Gastrointestinal: negative for abdominal pain  Assessment & Plan: Chronic bilateral lower extremity lymphedema -- Stable, continue lymphedema pumps -Continue PT/OT   Diabetes mellitus type 2 - A1c 6.5% - Discussed supplement regimen with RD as well - Trial of discontinuing Lantus and SSI.  Continue CBG monitoring; so far holding stable; do NOT resume insulins unless glucose uncontrolled without trial of further oral agents -Preference is to start her on Januvia (better bioavailability than Tradjenta).  Trial of Januvia started on 6/11.  Patient having prescription brought in for using home supply since Januvia not on formulary.  If further oral antihyperglycemic's needed, would consider adding a sulfonylurea next rather than resuming any insulins  Essential hypertension --Diuretics discontinued.  Stable, continue metoprolol.   Morbid obesity --Not interested in bariatric surgery   Disposition --Lives alone, unable to care for self, nonambulatory.  Plan for SNF.  Social worker following. - target weight goal 450 lb prior to discharge to SNF   Old records reviewed in assessment of this patient  Antimicrobials:   DVT prophylaxis: rivaroxaban (XARELTO)  tablet 10 mg   Code Status:   Code Status: Full Code Family Communication:   Disposition Plan: Status is: Inpatient  Remains inpatient appropriate because:Unsafe d/c plan  Dispo: The patient is from: Home              Anticipated d/c is to: SNF              Patient currently is medically stable to d/c.   Difficult to place patient Yes  Risk of unplanned readmission score: Unplanned Admission- Pilot do not use: 12.94   Objective: Blood pressure 119/80, pulse 88, temperature 98 F (36.7 C), resp. rate 18, height 5\' 10"  (1.778 m), weight (!) 217.4 kg, SpO2 93 %.  Examination: General appearance: alert, cooperative and no distress Head: Normocephalic, without obvious abnormality, atraumatic Eyes:  EOMI Lungs: clear to auscultation bilaterally Heart: regular rate and rhythm and S1, S2 normal Abdomen:  obese, soft, NT, ND, BS present but distant Extremities:  obese; lymphedema noted Skin:  Chronic xerosis and indurated skin Neurologic: Grossly normal  Consultants:    Procedures:    Data Reviewed: I have personally reviewed following labs and imaging studies Results for orders placed or performed during the hospital encounter of 10/10/20 (from the past 24 hour(s))  Glucose, capillary     Status: Abnormal   Collection Time: 12/01/20  4:54 PM  Result Value Ref Range   Glucose-Capillary 185 (H) 70 - 99 mg/dL  Glucose, capillary     Status: Abnormal   Collection Time: 12/01/20 10:08 PM  Result Value Ref Range   Glucose-Capillary 187 (H) 70 - 99 mg/dL  Glucose, capillary     Status: Abnormal   Collection Time: 12/02/20  7:07 AM  Result Value Ref Range   Glucose-Capillary 188 (H) 70 - 99 mg/dL  Glucose, capillary     Status: Abnormal   Collection Time: 12/02/20 11:18 AM  Result Value Ref Range   Glucose-Capillary 231 (H) 70 - 99 mg/dL    No results found for this or any previous visit (from the past 240 hour(s)).   Radiology Studies: No results found. VAS [place] LOWER  EXTREMITY VENOUS (DVT)  Final Result    DG Chest Port 1 View  Final Result      Scheduled Meds:  acetaminophen  1,000 mg Oral QHS   ferrous sulfate  325 mg Oral Daily   hydrocortisone cream   Topical BID   hydrOXYzine  10 mg Oral TID   loratadine  10 mg Oral QPM   magnesium oxide  800 mg Oral BID   metoprolol tartrate  12.5 mg Oral BID   nutrition supplement (JUVEN)  1 packet Oral BID BM   potassium chloride  40 mEq Oral Daily   Ensure Max Protein  11 oz Oral BID   rivaroxaban  10 mg Oral Daily   senna  1 tablet Oral BID   sitaGLIPtin  100 mg Oral Daily   PRN Meds: sodium chloride, acetaminophen, bisacodyl, diclofenac Sodium, iohexol, methocarbamol, ondansetron **OR** ondansetron (ZOFRAN) IV, oxyCODONE Continuous Infusions:  sodium chloride Stopped (11/15/20 1816)     LOS: 46 days  Time spent: Greater than 50% of the 35 minute visit was spent in counseling/coordination of care for the patient as laid out in the A&P.   Dwyane Dee, MD [facility] 12/02/2020, 3:02 PM

## 2020-12-03 LAB — GLUCOSE, CAPILLARY
Glucose-Capillary: 188 mg/dL — ABNORMAL HIGH (ref 70–99)
Glucose-Capillary: 191 mg/dL — ABNORMAL HIGH (ref 70–99)
Glucose-Capillary: 192 mg/dL — ABNORMAL HIGH (ref 70–99)
Glucose-Capillary: 223 mg/dL — ABNORMAL HIGH (ref 70–99)

## 2020-12-03 MED ORDER — SITAGLIPTIN PHOSPHATE 25 MG PO TABS
100.0000 mg | ORAL_TABLET | Freq: Every day | ORAL | Status: DC
  Administered 2020-12-04 – 2020-12-06 (×3): 100 mg via ORAL
  Filled 2020-12-03 (×4): qty 4
  Filled 2020-12-03: qty 1
  Filled 2020-12-03: qty 4

## 2020-12-03 NOTE — Progress Notes (Signed)
Progress Note    [name]   [id number]  DOB: July 22, 1965  DOA: 10/10/2020     49  PCP: Fanny Bien, MD  CC: skin breakdown  Hospital Course: 56 year old female with history of morbid obesity, diabetes mellitus type 2, essential hypertension, chronic lymphedema admitted and treated for skin breakdown.  Patient is currently nonambulatory, lives alone and unable to care for self.  Pending SNF placement.   Medically stable for discharge.  Interval History:  Doing well this morning. No events overnight.   ROS: Constitutional: negative for chills and fevers, Respiratory: negative for cough, Cardiovascular: negative for chest pain and Gastrointestinal: negative for abdominal pain  Assessment & Plan: Chronic bilateral lower extremity lymphedema -- Stable, continue lymphedema pumps -Continue PT/OT   Diabetes mellitus type 2 - A1c 6.5% - Discussed supplement regimen with RD as well - Trialing off insulin. Januvia started 6/11 by previous provider. She spent $50 for this rx as it is non-formulary so now really wants to give this a try - today I discussed with patient starting metformin. She is hesitant given hx gi upset several years ago. I explained the ER formulation is less likely to cause GI upset, and can assist with weight loss. She will think about this - indeed, if weight loss (which needs to happen before pt will be accepted to SNF) is a goal, a switch to semaglutide would likely be most efficacious.   Essential hypertension --Diuretics discontinued.  Stable, continue metoprolol. - will check bmp tomorrow   Morbid obesity --Not interested in bariatric surgery   Disposition --Lives alone, unable to care for self, nonambulatory.  Plan for SNF.  Social worker following. - target weight goal 450 lb prior to discharge to SNF   History depression - offered patient psych consult today, she declines  Antimicrobials:   DVT prophylaxis: rivaroxaban  (XARELTO) tablet 10 mg   Code Status:   Code Status: Full Code Family Communication:   Disposition Plan: Status is: Inpatient  Remains inpatient appropriate because:Unsafe d/c plan  Dispo: The patient is from: Home              Anticipated d/c is to: SNF              Patient currently is medically stable to d/c.   Difficult to place patient Yes  Risk of unplanned readmission score: Unplanned Admission- Pilot do not use: 12.94   Objective: Blood pressure 111/61, pulse 87, temperature 98.4 F (36.9 C), temperature source Oral, resp. rate 18, height 5\' 10"  (1.778 m), weight (!) 217.4 kg, SpO2 96 %.  Examination: General appearance: alert, cooperative and no distress Head: Normocephalic, without obvious abnormality, atraumatic Eyes:  EOMI Lungs: clear to auscultation bilaterally Heart: regular rate and rhythm and S1, S2 normal Abdomen:  obese, soft, NT, ND, BS present but distant Extremities:  obese; lymphedema noted Skin:  Chronic xerosis and indurated skin Neurologic: Grossly normal  Consultants:    Procedures:    Data Reviewed: I have personally reviewed following labs and imaging studies Results for orders placed or performed during the hospital encounter of 10/10/20 (from the past 24 hour(s))  Glucose, capillary     Status: Abnormal   Collection Time: 12/02/20  5:02 PM  Result Value Ref Range   Glucose-Capillary 148 (H) 70 - 99 mg/dL  Glucose, capillary     Status: Abnormal   Collection Time: 12/02/20  9:50 PM  Result Value Ref Range   Glucose-Capillary 179 (H) 70 - 99  mg/dL  Glucose, capillary     Status: Abnormal   Collection Time: 12/03/20  7:15 AM  Result Value Ref Range   Glucose-Capillary 188 (H) 70 - 99 mg/dL  Glucose, capillary     Status: Abnormal   Collection Time: 12/03/20 11:10 AM  Result Value Ref Range   Glucose-Capillary 191 (H) 70 - 99 mg/dL    No results found for this or any previous visit (from the past 240 hour(s)).   Radiology  Studies: No results found. VAS [place] LOWER EXTREMITY VENOUS (DVT)  Final Result    DG Chest Port 1 View  Final Result      Scheduled Meds:  acetaminophen  1,000 mg Oral QHS   ferrous sulfate  325 mg Oral Daily   hydrocortisone cream   Topical BID   hydrOXYzine  10 mg Oral TID   loratadine  10 mg Oral QPM   magnesium oxide  800 mg Oral BID   metoprolol tartrate  12.5 mg Oral BID   nutrition supplement (JUVEN)  1 packet Oral BID BM   potassium chloride  40 mEq Oral Daily   Ensure Max Protein  11 oz Oral BID   rivaroxaban  10 mg Oral Daily   senna  1 tablet Oral BID   [START ON 12/04/2020] sitaGLIPtin  100 mg Oral Daily   PRN Meds: sodium chloride, acetaminophen, bisacodyl, diclofenac Sodium, iohexol, methocarbamol, ondansetron **OR** ondansetron (ZOFRAN) IV, oxyCODONE Continuous Infusions:  sodium chloride Stopped (11/15/20 1816)     LOS: 47 days  Time spent: Greater than 50% of the 23 minute visit was spent in counseling/coordination of care for the patient as laid out in the A&P.   Desma Maxim, MD [facility] 12/03/2020, 1:45 PM

## 2020-12-03 NOTE — Progress Notes (Signed)
Nutrition Follow-up  DOCUMENTATION CODES:   Morbid obesity  INTERVENTION:  - continue 1 packet Juven BID and Ensure Max BID. - weigh patient daily.    NUTRITION DIAGNOSIS:   Increased nutrient needs related to wound healing as evidenced by estimated needs. -ongoing  GOAL:   Patient will meet greater than or equal to 90% of their needs -minimally met  MONITOR:   PO intake, Supplement acceptance, Labs, Weight trends, I & O's   ASSESSMENT:   55 year old female with medical history of type 2 DM, morbid obesity, chronic lymphedema, chronic non-healing ulcers on both legs and calves and L upper thigh. She is followed by wound clinic and followed by [facility] 3 times per week until recently d/t insurance company no longer covering this service. She presented to the ED d/t worsening or wounds which have had increased drainage and pain. She is unable to change the dressings herself. At home, she did not walk but would move around the house in a rolling chair 1-2 times/day. She lives alone. Currently pending d/c to SNF but is a difficult placement d/t weight.  She is on Heart Healthy, Carb Modified diet. Most recent meal completions as outlined below. No meal completions documented from 6/13.  6/10- 50% of breakfast and 75% of lunch (total of 630 kcal and 22 grams protein)  6/11- 75% of all meals (total of 987 kcal and 48 grams protein)  6/12- 100% of all meals (total of 1578 kcal and 75 grams protein)   She has been accepting Ensure Max 100% of the time offered and Juven 78% of the time offered. She she consumes 100% each time she accepts these supplements, she is consuming a daily average of 448 kcal and 34 grams protein from oral nutrition supplements.  Weights over the past 1 week: 6/8- 478 lb 6/9- 474 lb 6/10- 487 lb  6/11-  478 lb 6/13- 479 lb   Non-pitting edema to BLE documented in the edema section of flow sheet yesterday. On 6/12 she was noted to have  non-pitting edema to all extremities and mild pitting edema to perineal area.  Patient was last evaluated by OT on 6/10. Note outlines that therapy goals needed to be downgraded and that patient's functional status has remained the same since initial evaluation on 4/28. OT signed off on 6/10.   She was last seen by PT on 6/6.   Patient is medically stable pending d/c to SNF. Patient unable to be discharged to a facility until she is </= 450 lb.    Labs reviewed; most recent HgbA1c: 6.5%, CBG: 188 mg/dl. Medications reviewed; 325 mg ferrous sulfate/day, 800 mg mag-ox BID, 40 mEq Klor-Con/day, 1 tablet senokot BID, 100 mg januvia once/day started 6/11.     Diet Order:   Diet Order             Diet heart healthy/carb modified Room service appropriate? Yes; Fluid consistency: Thin  Diet effective now                   EDUCATION NEEDS:   Education needs have been addressed  Skin:  Skin Assessment: Skin Integrity Issues: Skin Integrity Issues:: Other (Comment) Other: L thigh and L tibia pressure injuries (documented 4/22); MASD to coccyx (newly documented 6/7)  Last BM:  6/10  Height:   Ht Readings from Last 1 Encounters:  11/15/20 _0  (1.778 m)    Weight:   Wt Readings from Last 1 Encounters:  12/02/20 (!) 217.4  kg      Estimated Nutritional Needs:  Kcal:  2000-2200 kcal Protein:  120-135 grams Fluid:  >/= 2 L/day     Jarome Matin, MS, RD, LDN, CNSC Inpatient Clinical Dietitian RD pager # available in [facility]  After hours/weekend pager # available in [facility]

## 2020-12-04 DIAGNOSIS — E875 Hyperkalemia: Secondary | ICD-10-CM

## 2020-12-04 LAB — BASIC METABOLIC PANEL
Anion gap: 6 (ref 5–15)
BUN: 21 mg/dL — ABNORMAL HIGH (ref 6–20)
CO2: 26 mmol/L (ref 22–32)
Calcium: 8.9 mg/dL (ref 8.9–10.3)
Chloride: 104 mmol/L (ref 98–111)
Creatinine, Ser: 0.74 mg/dL (ref 0.44–1.00)
GFR, Estimated: >60 mL/min (ref >60)
Glucose, Bld: 172 mg/dL — ABNORMAL HIGH (ref 70–99)
Potassium: 4 mmol/L (ref 3.5–5.1)
Sodium: 136 mmol/L (ref 135–145)

## 2020-12-04 LAB — HEPATIC FUNCTION PANEL
ALT: 18 U/L (ref 0–44)
AST: 20 U/L (ref 15–41)
Albumin: 3.4 g/dL — ABNORMAL LOW (ref 3.5–5.0)
Alkaline Phosphatase: 72 U/L (ref 38–126)
Bilirubin, Direct: 0.1 mg/dL (ref 0.0–0.2)
Indirect Bilirubin: 0.6 mg/dL (ref 0.3–0.9)
Total Bilirubin: 0.7 mg/dL (ref 0.3–1.2)
Total Protein: 7.6 g/dL (ref 6.5–8.1)

## 2020-12-04 LAB — CBC WITH DIFFERENTIAL/PLATELET
Abs Immature Granulocytes: 0.04 10*3/uL (ref 0.00–0.07)
Basophils Absolute: 0 10*3/uL (ref 0.0–0.1)
Basophils Relative: 0 %
Eosinophils Absolute: 0.3 10*3/uL (ref 0.0–0.5)
Eosinophils Relative: 3 %
HCT: 43.1 % (ref 36.0–46.0)
Hemoglobin: 13 g/dL (ref 12.0–15.0)
Immature Granulocytes: 0 %
Lymphocytes Relative: 21 %
Lymphs Abs: 2 10*3/uL (ref 0.7–4.0)
MCH: 29 pg (ref 26.0–34.0)
MCHC: 30.2 g/dL (ref 30.0–36.0)
MCV: 96.2 fL (ref 80.0–100.0)
Monocytes Absolute: 0.5 10*3/uL (ref 0.1–1.0)
Monocytes Relative: 6 %
Neutro Abs: 6.4 10*3/uL (ref 1.7–7.7)
Neutrophils Relative %: 70 %
Platelets: 251 10*3/uL (ref 150–400)
RBC: 4.48 MIL/uL (ref 3.87–5.11)
RDW: 13.5 % (ref 11.5–15.5)
WBC: 9.2 10*3/uL (ref 4.0–10.5)
nRBC: 0 % (ref 0.0–0.2)

## 2020-12-04 LAB — MAGNESIUM: Magnesium: 1.6 mg/dL — ABNORMAL LOW (ref 1.7–2.4)

## 2020-12-04 LAB — GLUCOSE, CAPILLARY
Glucose-Capillary: 177 mg/dL — ABNORMAL HIGH (ref 70–99)
Glucose-Capillary: 164 mg/dL — ABNORMAL HIGH (ref 70–99)
Glucose-Capillary: 181 mg/dL — ABNORMAL HIGH (ref 70–99)
Glucose-Capillary: 168 mg/dL — ABNORMAL HIGH (ref 70–99)

## 2020-12-04 LAB — PHOSPHORUS: Phosphorus: 5.1 mg/dL — ABNORMAL HIGH (ref 2.5–4.6)

## 2020-12-04 MED ORDER — MAGNESIUM SULFATE 2 GM/50ML IV SOLN: 2.0000 g | Freq: Once | INTRAVENOUS | Status: DC

## 2020-12-04 MED ORDER — METFORMIN HCL ER 500 MG PO TB24
500.0000 mg | ORAL_TABLET | Freq: Two times a day (BID) | ORAL | Status: DC
  Administered 2020-12-04 – 2020-12-27 (×47): 500 mg via ORAL
  Filled 2020-12-04 (×48): qty 1

## 2020-12-04 NOTE — Progress Notes (Addendum)
Inpatient Diabetes Program Recommendations  AACE/ADA: New Consensus Statement on Inpatient Glycemic Control (2015)  Target Ranges:  Prepandial:   less than 140 mg/dL      Peak postprandial:   less than 180 mg/dL (1-2 hours)      Critically ill patients:  140 - 180 mg/dL   Lab Results  Component Value Date   GLUCAP 177 (H) 12/04/2020   HGBA1C 6.5 (H) 10/10/2020    Review of Glycemic Control Results for [name], [name] (MRN [id number]) as of 12/04/2020 10:16  Ref. Range 12/03/2020 07:15 12/03/2020 11:10 12/03/2020 17:02 12/03/2020 20:40 12/04/2020 07:38  Glucose-Capillary Latest Ref Range: 70 - 99 mg/dL 188 (H) 191 (H) 192 (H) 223 (H) 177 (H)   Diabetes history: DM 2 Outpatient Diabetes medications: None Current orders for Inpatient glycemic control: Januvia 100 mg Daily  Inpatient Diabetes Program Recommendations:    Prefers not to be on Novolog sliding scale while in the hospital Has refused metformin in the past. - consider adding Amaryl 1 mg Daily  Thanks,  Tama Headings RN, MSN, BC-ADM Inpatient Diabetes Coordinator Team Pager [phone] (8a-5p)

## 2020-12-04 NOTE — Progress Notes (Signed)
PROGRESS NOTE    [name]  JQB:[id number] DOB: May 07, 1966 DOA: 10/10/2020 PCP: Fanny Bien, MD   Brief Narrative:  The patient is a super morbidly obese African-American female with a past medical history significant for but not limited to diabetes mellitus type 2, essential hypertension, chronic lymphedema as well as other comorbidities who was admitted and treated for skin breakdown and concern for a infected wound.  She is currently nonambulatory and lives alone and unable to care for self.  She has completed antibiotics and has been deemed medically stable to be discharged however disposition is difficult given her nonambulatory status and because SNF requires the patient to be under 450 pounds for her bariatric bed.  Assessment & Plan:   Principal Problem:   Wound infection Active Problems:   Hyperkalemia   Chronic peripheral venous hypertension with lower extremity complication   Lymphedema   Obesity   Type 2 diabetes mellitus (HCC)   HTN (hypertension)   Cellulitis  Chronic bilateral lower extremity lymphedema with Chronic Non-healing Ulcers and concern for infected wound  -Stable; continue lymphedema pumps -Completed Vancomycin  -PT OT to evaluate and treat and once bariatric bed is available at SNF can be discharged  Diabetes Mellitus Type 2 -Fairly well controlled with a hemoglobin A1c of 6.5 -Discussed supplement regimen with nutrition registered dietitian as well -Had extensive discussions of glucose control while in the hospital.  Patient did not want any sliding scale insulin while she was hospitalized so Januvia was started on 11/30/2020 by previous provider.  The patient spent $50 for this prescription as it is nonformulary and wanted to give it a try however now she is agreeable to starting metformin XR; she was hesitant given her history of GI upset several years ago and she is willing to try the ER formulation -We we will start metformin 500 mg XR  twice daily; Continuing  -Continue to monitor CBGs carefully; these ranging from 164-223 -Diabetes education coordinator consulted for further assistance in recommended starting Amaryl 1 mg p.o. daily however the patient does not want to pursue this option currently  Essential Hypertension -Diuretics has been discontinued  -C/w Metoprolol Tartrate 12.5 mg po BID -Continue to Monitor BP per Protocol -Last BP was 138/77  Hypomagnesemia -Patient's Mag Level was 1.6 -Was wanting to Replete with IV Mag Sulfate 2 grams but patient has no IV -Continue Repletion with Mag Oxide 800 mg po BID -Continue to Monitor and Replete as Necessary   Super Morbid Obesity -Complicates overall prognosis and care -Estimated body mass index is 69.28 kg/m as calculated from the following:   Height as of this encounter: 5\' 10"  (1.778 m).   Weight as of this encounter: 219 kg. -Weight Loss and Dietary Counseling given; She is currently not interested in Bariatric Surgrery  -Nutritionist Consulted and recommending Continuing 1 packet of Juven BID and Ensure Max BID as well as weighing the patient daily   Hx of Depression -Currently no SI/HI -Was offered a Production designer, theatre/television/film  Disposition -She is not a safe discharge disposition as she lives alone and is unable to care for self as she is nonambulatory -No plans for SNF and social worker is assisting with placement -She has to have a target weight goal of 450 pounds prior to discharging to SNF  DVT prophylaxis: Anticoagulated with Rivaroxaban 10 mg po daily  Code Status: FULL CODE  Family Communication: No family present at beds Disposition Plan: SNF when bed available   Status is: Inpatient  Remains inpatient appropriate because:Unsafe d/c plan, IV treatments appropriate due to intensity of illness or inability to take PO, and Inpatient level of care appropriate due to severity of illness  Dispo: The patient is from: Home              Anticipated d/c is to:  SNF              Patient currently is not medically stable to d/c.   Difficult to place patient Yes  Consultants:  None  Procedures:  None  Antimicrobials: Anti-infectives (From admission, onward)    Start     Dose/Rate Route Frequency Ordered Stop   10/11/20 1000  vancomycin (VANCOREADY) IVPB 2000 mg/400 mL  Status:  Discontinued        2,000 mg 200 mL/hr over 120 Minutes Intravenous Every 12 hours 10/10/20 2047 10/11/20 1140   10/11/20 0100  ceFEPIme (MAXIPIME) 2 g in sodium chloride 0.9 % 100 mL IVPB  Status:  Discontinued        2 g 200 mL/hr over 30 Minutes Intravenous Every 8 hours 10/10/20 2001 10/11/20 1140   10/10/20 2100  vancomycin (VANCOCIN) 2,500 mg in sodium chloride 0.9 % 500 mL IVPB        2,500 mg 250 mL/hr over 120 Minutes Intravenous  Once 10/10/20 2001 10/11/20 0135   10/10/20 1545  ceFAZolin (ANCEF) IVPB 2g/100 mL premix        2 g 200 mL/hr over 30 Minutes Intravenous  Once 10/10/20 1530 10/10/20 1723        Subjective: Seen and examined and was hesitant to restart metformin however now agreeable.  Did not want to take Amaryl.  She denies any nausea or vomiting but states that she did not feel well and complained of some leg pain bilaterally.  No nausea or vomiting.  No other concerns or complaints at this time.  Objective: Vitals:   12/03/20 1259 12/03/20 2113 12/04/20 0506 12/04/20 0508  BP: 111/61 (!) 153/67  130/81  Pulse: 87 98  93  Resp: 18 17  18   Temp: 98.4 F (36.9 C) 98.7 F (37.1 C)  98.1 F (36.7 C)  TempSrc: Oral Oral  Oral  SpO2: 96% 94%  93%  Weight:   (!) 219 kg   Height:        Intake/Output Summary (Last 24 hours) at 12/04/2020 0834 Last data filed at 12/04/2020 0751 Gross per 24 hour  Intake 360 ml  Output 1200 ml  Net -840 ml   Filed Weights   11/30/20 0600 12/02/20 0446 12/04/20 0506  Weight: (!) 216.7 kg (!) 217.4 kg (!) 219 kg   Examination: Physical Exam:  Constitutional: WN/WD super morbidly obese AAF in NAD  and appears calm and comfortable Eyes: Lids and conjunctivae normal, sclerae anicteric  ENMT: External Ears, Nose appear normal.  Neck: Appears normal, supple, no cervical masses, normal ROM, no appreciable thyromegaly; difficult to assess JVD status given her body habitus Respiratory: Diminished to auscultation bilaterally, no wheezing, rales, rhonchi or crackles. Normal respiratory effort and patient is not tachypenic. No accessory muscle use. Unlabored breathing  Cardiovascular: RRR, no murmurs / rubs / gallops. S1 and S2 auscultated.  Has chronic lower extremity venous stasis changes and lymphedema Abdomen: Soft, non-tender, distended secondary to body habitus. Bowel sounds positive.  GU: Deferred. Musculoskeletal: No clubbing / cyanosis of digits/nails. No joint deformity upper and lower extremities.  Skin: Posterior left leg wound noted no induration; Warm and dry.  Neurologic: CN  2-12 grossly intact with no focal deficits. Romberg sign and cerebellar reflexes not assessed.  Psychiatric: Normal judgment and insight. Alert and oriented x 3. Normal mood and appropriate affect.   Data Reviewed: I have personally reviewed following labs and imaging studies  CBC: Recent Labs  Lab 11/27/20 1108  WBC 5.7  HGB 12.8  HCT 43.1  MCV 96.0  PLT 025   Basic Metabolic Panel: Recent Labs  Lab 11/27/20 1108 12/04/20 0310  NA 136 136  K 4.0 4.0  CL 102 104  CO2 27 26  GLUCOSE 193* 172*  BUN 22* 21*  CREATININE 0.66 0.74  CALCIUM 8.8* 8.9  MG 1.5*  --    GFR: Estimated Creatinine Clearance: 167.1 mL/min (by C-G formula based on SCr of 0.74 mg/dL). Liver Function Tests: Recent Labs  Lab 11/27/20 1108  AST 16  ALT 15  ALKPHOS 62  BILITOT 0.6  PROT 7.5  ALBUMIN 3.4*   No results for input(s): LIPASE, AMYLASE in the last 168 hours. No results for input(s): AMMONIA in the last 168 hours. Coagulation Profile: No results for input(s): INR, PROTIME in the last 168 hours. Cardiac  Enzymes: No results for input(s): CKTOTAL, CKMB, CKMBINDEX, TROPONINI in the last 168 hours. BNP (last 3 results) No results for input(s): PROBNP in the last 8760 hours. HbA1C: No results for input(s): HGBA1C in the last 72 hours. CBG: Recent Labs  Lab 12/03/20 0715 12/03/20 1110 12/03/20 1702 12/03/20 2040 12/04/20 0738  GLUCAP 188* 191* 192* 223* 177*   Lipid Profile: No results for input(s): CHOL, HDL, LDLCALC, TRIG, CHOLHDL, LDLDIRECT in the last 72 hours. Thyroid Function Tests: No results for input(s): TSH, T4TOTAL, FREET4, T3FREE, THYROIDAB in the last 72 hours. Anemia Panel: No results for input(s): VITAMINB12, FOLATE, FERRITIN, TIBC, IRON, RETICCTPCT in the last 72 hours. Sepsis Labs: No results for input(s): PROCALCITON, LATICACIDVEN in the last 168 hours.  No results found for this or any previous visit (from the past 240 hour(s)).   RN Pressure Injury Documentation: Pressure Injury 10/11/20 Thigh Left;Posterior (Active)  10/11/20 0041  Location: Thigh  Location Orientation: Left;Posterior  Staging:   Wound Description (Comments):   Present on Admission:      Pressure Injury 10/11/20 Tibial Left;Posterior;Proximal (Active)  10/11/20 0041  Location: Tibial  Location Orientation: Left;Posterior;Proximal  Staging:   Wound Description (Comments):   Present on Admission:      Estimated body mass index is 69.28 kg/m as calculated from the following:   Height as of this encounter: 5\' 10"  (1.778 m).   Weight as of this encounter: 219 kg.  Malnutrition Type:  Nutrition Problem: Increased nutrient needs Etiology: wound healing   Malnutrition Characteristics:  Signs/Symptoms: estimated needs   Nutrition Interventions:  Interventions: Juven, Other (Comment) (Ensure Max)   Radiology Studies: No results found.  Scheduled Meds:  acetaminophen  1,000 mg Oral QHS   ferrous sulfate  325 mg Oral Daily   hydrocortisone cream   Topical BID   hydrOXYzine   10 mg Oral TID   loratadine  10 mg Oral QPM   magnesium oxide  800 mg Oral BID   metoprolol tartrate  12.5 mg Oral BID   nutrition supplement (JUVEN)  1 packet Oral BID BM   potassium chloride  40 mEq Oral Daily   Ensure Max Protein  11 oz Oral BID   rivaroxaban  10 mg Oral Daily   senna  1 tablet Oral BID   sitaGLIPtin  100 mg Oral Daily  Continuous Infusions:  sodium chloride Stopped (11/15/20 1816)    LOS: 64 days   Kerney Elbe, DO [facility] PAGER is on [place]  If 7PM-7AM, please contact night-coverage [email or web address]

## 2020-12-04 NOTE — Plan of Care (Signed)
  Problem: Activity: Goal: Risk for activity intolerance will decrease Outcome: Progressing   Problem: Pain Managment: Goal: General experience of comfort will improve Outcome: Progressing   

## 2020-12-05 LAB — GLUCOSE, CAPILLARY
Glucose-Capillary: 200 mg/dL — ABNORMAL HIGH (ref 70–99)
Glucose-Capillary: 193 mg/dL — ABNORMAL HIGH (ref 70–99)
Glucose-Capillary: 149 mg/dL — ABNORMAL HIGH (ref 70–99)
Glucose-Capillary: 165 mg/dL — ABNORMAL HIGH (ref 70–99)

## 2020-12-05 MED ORDER — KETOROLAC TROMETHAMINE 10 MG PO TABS
10.0000 mg | ORAL_TABLET | Freq: Three times a day (TID) | ORAL | Status: AC | PRN
  Administered 2020-12-05 – 2020-12-08 (×6): 10 mg via ORAL
  Filled 2020-12-05 (×9): qty 1

## 2020-12-05 MED ORDER — ACETAMINOPHEN 325 MG PO TABS
650.0000 mg | ORAL_TABLET | Freq: Four times a day (QID) | ORAL | Status: DC | PRN
  Administered 2020-12-09 – 2020-12-20 (×4): 650 mg via ORAL
  Filled 2020-12-05 (×5): qty 2

## 2020-12-05 NOTE — Progress Notes (Signed)
OT Cancellation Note  Patient Details Name: [name] MRN: [id number] DOB: [birth year]   Cancelled Treatment:    Reason Eval/Treat Not Completed: Other (comment) OT order received 6/16, Acute OT signed off 6/10 due to following: patient currently dependent on caregivers. Recommend long term care and 24/7 assistance at discharge. If patient makes any significant improvement with functional mobility to allow for improved participation with ADLs please re-consult OT. Pleaserefer to 6/10 D/C note for any further detail. Will defer OT eval at this time.   Delbert Phenix OT OT pager: [place] 12/05/2020, 1:12 PM

## 2020-12-05 NOTE — Progress Notes (Signed)
PROGRESS NOTE    [name]  WIO:[id number] DOB: [birth year] DOA: 10/10/2020 PCP: Fanny Bien, MD   Brief Narrative:  The patient is a super morbidly obese African-American female with a past medical history significant for but not limited to diabetes mellitus type 2, essential hypertension, chronic lymphedema as well as other comorbidities who was admitted and treated for skin breakdown and concern for a infected wound.  She is currently nonambulatory and lives alone and unable to care for self.  She has completed antibiotics and has been deemed medically stable to be discharged however disposition is difficult given her nonambulatory status and because SNF requires the patient to be under 450 pounds for her bariatric bed and currently she is 482.  Assessment & Plan:   Principal Problem:   Wound infection Active Problems:   Hyperkalemia   Chronic peripheral venous hypertension with lower extremity complication   Lymphedema   Obesity   Type 2 diabetes mellitus (HCC)   HTN (hypertension)   Cellulitis  Chronic bilateral lower extremity lymphedema with Chronic Non-healing Ulcers and concern for infected wound  -Stable; continue lymphedema pumps -Completed Vancomycin  -PT OT to evaluate and treat and once bariatric bed is available at SNF can be discharged; Patient has been discharged from an OT standpoint   Diabetes Mellitus Type 2 -Fairly well controlled with a hemoglobin A1c of 6.5 -Discussed supplement regimen with nutrition registered dietitian as well -Had extensive discussions of glucose control while in the hospital.  Patient did not want any sliding scale insulin while she was hospitalized so Januvia was started on 11/30/2020 by previous provider.  The patient spent $50 for this prescription as it is nonformulary and wanted to give it a try however now she is agreeable to starting metformin XR; she was hesitant given her history of GI upset several years ago and she  is willing to try the ER formulation and this was started last night  -We we will start metformin 500 mg XR twice daily; Continuing Januvia 100 mg for now  -Continue to monitor CBGs carefully; these ranging from 168-223 -Diabetes education coordinator consulted for further assistance in recommended starting Amaryl 1 mg p.o. daily however the patient does not want to pursue this option currently  Essential Hypertension -Diuretics has been discontinued  -C/w Metoprolol Tartrate 12.5 mg po BID -Continue to Monitor BP per Protocol -Last BP was 143/83  Hypomagnesemia -Patient's Mag Level was 1.6 -Was wanting to Replete with IV Mag Sulfate 2 grams but patient has no IV and patient refuses  -Continue Repletion with Mag Oxide 800 mg po BID -Continue to Monitor and Replete as Necessary   Super Morbid Obesity -Complicates overall prognosis and care -Estimated body mass index is 69.28 kg/m as calculated from the following:   Height as of this encounter: 5\' 10"  (4.268 m).   Weight as of this encounter: 219 kg. -Weight Loss and Dietary Counseling given; She is currently not interested in Bariatric Surgrery  -Nutritionist Consulted and recommending Continuing 1 packet of Juven BID and Ensure Max BID as well as weighing the patient daily   Chronic Pain -C/w Oxycodone 10 mg q4hprn Breakthrough Pain, Diclofenac 2 gram topically 4 times daily PRN, Acetaminophen 1000 mg po qHS and Acetaminophen 650 mg po q6hprn -Will add Ketorolac 10 mg po q8hprn Moderate/Severe Pain   Hx of Depression -Currently no SI/HI -Was offered a Psych Consult  Disposition -She is not a safe discharge disposition as she lives alone and is unable  to care for self as she is nonambulatory -No plans for SNF and social worker is assisting with placement -She has to have a target weight goal of 450 pounds prior to discharging to SNF  DVT prophylaxis: Anticoagulated with Rivaroxaban 10 mg po daily  Code Status: FULL CODE   Family Communication: No family present at beds Disposition Plan: SNF when bed available   Status is: Inpatient  Remains inpatient appropriate because:Unsafe d/c plan, IV treatments appropriate due to intensity of illness or inability to take PO, and Inpatient level of care appropriate due to severity of illness  Dispo: The patient is from: Home              Anticipated d/c is to: SNF              Patient currently is not medically stable to d/c.   Difficult to place patient Yes  Consultants:  None  Procedures:  None  Antimicrobials: Anti-infectives (From admission, onward)    Start     Dose/Rate Route Frequency Ordered Stop   10/11/20 1000  vancomycin (VANCOREADY) IVPB 2000 mg/400 mL  Status:  Discontinued        2,000 mg 200 mL/hr over 120 Minutes Intravenous Every 12 hours 10/10/20 2047 10/11/20 1140   10/11/20 0100  ceFEPIme (MAXIPIME) 2 g in sodium chloride 0.9 % 100 mL IVPB  Status:  Discontinued        2 g 200 mL/hr over 30 Minutes Intravenous Every 8 hours 10/10/20 2001 10/11/20 1140   10/10/20 2100  vancomycin (VANCOCIN) 2,500 mg in sodium chloride 0.9 % 500 mL IVPB        2,500 mg 250 mL/hr over 120 Minutes Intravenous  Once 10/10/20 2001 10/11/20 0135   10/10/20 1545  ceFAZolin (ANCEF) IVPB 2g/100 mL premix        2 g 200 mL/hr over 30 Minutes Intravenous  Once 10/10/20 1530 10/10/20 1723        Subjective: Seen and examined and she is still having a lot of pain in her lower extremities and complained of back pain.  Thinks that she is become tolerant of her oxycodone that she gets and was wanting to see if her pain regimen could be adjusted.  No lightheadedness or dizziness.  No other concerns or complaints at this time.  Objective: Vitals:   12/04/20 1358 12/04/20 2157 12/04/20 2219 12/05/20 0506  BP: 138/77 139/77  (!) 143/83  Pulse: 92 89  91  Resp: 15 16  17   Temp: 97.9 F (36.6 C) 98.7 F (37.1 C)  98.1 F (36.7 C)  TempSrc:  Oral  Oral  SpO2: 93%  (!) 80% 96% 90%  Weight:      Height:        Intake/Output Summary (Last 24 hours) at 12/05/2020 1310 Last data filed at 12/05/2020 0505 Gross per 24 hour  Intake 670 ml  Output 450 ml  Net 220 ml    Filed Weights   11/30/20 0600 12/02/20 0446 12/04/20 0506  Weight: (!) 216.7 kg (!) 217.4 kg (!) 219 kg   Examination: Physical Exam:  Constitutional: WN/WD super morbidly obese AAF in NAD and appears calm but slightly uncomfortable  Eyes: Lids and conjunctivae normal, sclerae anicteric  ENMT: External Ears, Nose appear normal. Grossly normal hearing.  Neck: Appears normal, supple, no cervical masses, normal ROM, no appreciable thyromegaly; no JVD Respiratory: Diminished to auscultation bilaterally, no wheezing, rales, rhonchi or crackles. Normal respiratory effort and patient is not  tachypenic. No accessory muscle use. Unlabored breathing  Cardiovascular: RRR, no murmurs / rubs / gallops. S1 and S2 auscultated.  Has chronic lower extremity venous stasis changes and lymphedema Abdomen: Soft, non-tender, distended secondary to body habitus. No masses palpated. No appreciable hepatosplenomegaly. Bowel sounds positive.  GU: Deferred. Musculoskeletal: No clubbing / cyanosis of digits/nails.  Skin: Has a posterior left leg wound noted.. No induration; Warm and dry.  Neurologic: CN 2-12 grossly intact with no focal deficits. Romberg sign cerebellar reflexes not assessed.  Psychiatric: Normal judgment and insight. Alert and oriented x 3. Normal mood and appropriate affect.   Data Reviewed: I have personally reviewed following labs and imaging studies  CBC: Recent Labs  Lab 12/04/20 0310  WBC 9.2  NEUTROABS 6.4  HGB 13.0  HCT 43.1  MCV 96.2  PLT 992    Basic Metabolic Panel: Recent Labs  Lab 12/04/20 0310  NA 136  K 4.0  CL 104  CO2 26  GLUCOSE 172*  BUN 21*  CREATININE 0.74  CALCIUM 8.9  MG 1.6*  PHOS 5.1*    GFR: Estimated Creatinine Clearance: 167.1 mL/min (by C-G  formula based on SCr of 0.74 mg/dL). Liver Function Tests: Recent Labs  Lab 12/04/20 0310  AST 20  ALT 18  ALKPHOS 72  BILITOT 0.7  PROT 7.6  ALBUMIN 3.4*    No results for input(s): LIPASE, AMYLASE in the last 168 hours. No results for input(s): AMMONIA in the last 168 hours. Coagulation Profile: No results for input(s): INR, PROTIME in the last 168 hours. Cardiac Enzymes: No results for input(s): CKTOTAL, CKMB, CKMBINDEX, TROPONINI in the last 168 hours. BNP (last 3 results) No results for input(s): PROBNP in the last 8760 hours. HbA1C: No results for input(s): HGBA1C in the last 72 hours. CBG: Recent Labs  Lab 12/04/20 1206 12/04/20 1659 12/04/20 2217 12/05/20 0720 12/05/20 1206  GLUCAP 164* 181* 168* 200* 193*    Lipid Profile: No results for input(s): CHOL, HDL, LDLCALC, TRIG, CHOLHDL, LDLDIRECT in the last 72 hours. Thyroid Function Tests: No results for input(s): TSH, T4TOTAL, FREET4, T3FREE, THYROIDAB in the last 72 hours. Anemia Panel: No results for input(s): VITAMINB12, FOLATE, FERRITIN, TIBC, IRON, RETICCTPCT in the last 72 hours. Sepsis Labs: No results for input(s): PROCALCITON, LATICACIDVEN in the last 168 hours.  No results found for this or any previous visit (from the past 240 hour(s)).   RN Pressure Injury Documentation: Pressure Injury 10/11/20 Thigh Left;Posterior (Active)  10/11/20 0041  Location: Thigh  Location Orientation: Left;Posterior  Staging:   Wound Description (Comments):   Present on Admission:      Pressure Injury 10/11/20 Tibial Left;Posterior;Proximal (Active)  10/11/20 0041  Location: Tibial  Location Orientation: Left;Posterior;Proximal  Staging:   Wound Description (Comments):   Present on Admission:      Estimated body mass index is 69.28 kg/m as calculated from the following:   Height as of this encounter: 5\' 10"  (1.778 m).   Weight as of this encounter: 219 kg.  Malnutrition Type:  Nutrition Problem:  Increased nutrient needs Etiology: wound healing   Malnutrition Characteristics:  Signs/Symptoms: estimated needs   Nutrition Interventions:  Interventions: Juven, Other (Comment) (Ensure Max)   Radiology Studies: No results found.  Scheduled Meds:  acetaminophen  1,000 mg Oral QHS   ferrous sulfate  325 mg Oral Daily   hydrocortisone cream   Topical BID   hydrOXYzine  10 mg Oral TID   loratadine  10 mg Oral QPM   magnesium  oxide  800 mg Oral BID   metFORMIN  500 mg Oral BID WC   metoprolol tartrate  12.5 mg Oral BID   nutrition supplement (JUVEN)  1 packet Oral BID BM   potassium chloride  40 mEq Oral Daily   Ensure Max Protein  11 oz Oral BID   rivaroxaban  10 mg Oral Daily   senna  1 tablet Oral BID   sitaGLIPtin  100 mg Oral Daily   Continuous Infusions:  sodium chloride Stopped (11/15/20 1816)    LOS: 27 days   Kerney Elbe, DO [facility] PAGER is on [place]  If 7PM-7AM, please contact night-coverage [email or web address]

## 2020-12-06 LAB — GLUCOSE, CAPILLARY
Glucose-Capillary: 167 mg/dL — ABNORMAL HIGH (ref 70–99)
Glucose-Capillary: 183 mg/dL — ABNORMAL HIGH (ref 70–99)
Glucose-Capillary: 167 mg/dL — ABNORMAL HIGH (ref 70–99)
Glucose-Capillary: 227 mg/dL — ABNORMAL HIGH (ref 70–99)

## 2020-12-06 NOTE — Progress Notes (Signed)
PROGRESS NOTE    [name]  YTK:[id number] DOB: 07-10-1965 DOA: 10/10/2020 PCP: Fanny Bien, MD   Brief Narrative:  The patient is a super morbidly obese African-American female with a past medical history significant for but not limited to diabetes mellitus type 2, essential hypertension, chronic lymphedema as well as other comorbidities who was admitted and treated for skin breakdown and concern for a infected wound.  She is currently nonambulatory and lives alone and unable to care for self.  She has completed antibiotics and has been deemed medically stable to be discharged however disposition is difficult given her nonambulatory status and because SNF requires the patient to be under 450 pounds for her bariatric bed and currently she is 482. Patient's Pain is improved with the addition of PRN Ketorolac.   Assessment & Plan:   Principal Problem:   Wound infection Active Problems:   Hyperkalemia   Chronic peripheral venous hypertension with lower extremity complication   Lymphedema   Obesity   Type 2 diabetes mellitus (HCC)   HTN (hypertension)   Cellulitis  Chronic bilateral lower extremity lymphedema with Chronic Non-healing Ulcers and concern for infected wound  -Stable; continue lymphedema pumps -Completed Vancomycin  -PT OT to evaluate and treat and once bariatric bed is available at SNF can be discharged; Patient has been discharged from an OT standpoint   Diabetes Mellitus Type 2 -Fairly well controlled with a hemoglobin A1c of 6.5 -Discussed supplement regimen with nutrition registered dietitian as well -Had extensive discussions of glucose control while in the hospital.  Patient did not want any sliding scale insulin while she was hospitalized so Januvia was started on 11/30/2020 by previous provider.  The patient spent $50 for this prescription as it is nonformulary and wanted to give it a try however now she is agreeable to starting metformin XR; she was  hesitant given her history of GI upset several years ago and she is willing to try the ER formulation and this was started last night  -We we will start metformin 500 mg XR twice daily; Continuing Januvia 100 mg for now  -Continue to monitor CBGs carefully; these ranging from 149-200 -Diabetes education coordinator consulted for further assistance in recommended starting Amaryl 1 mg p.o. daily however the patient does not want to pursue this option currently  Essential Hypertension -Diuretics has been discontinued  -C/w Metoprolol Tartrate 12.5 mg po BID -Continue to Monitor BP per Protocol -Last BP was 147/75  Hypomagnesemia -Patient's Mag Level was 1.6 on last check  -Was wanting to Replete with IV Mag Sulfate 2 grams but patient has no IV and patient refuses  -Continue Repletion with Mag Oxide 800 mg po BID -Continue to Monitor and Replete as Necessary   Super Morbid Obesity -Complicates overall prognosis and care -Estimated body mass index is 69.28 kg/m as calculated from the following:   Height as of this encounter: 5\' 10"  (1.778 m).   Weight as of this encounter: 219 kg. -Weight Loss and Dietary Counseling given; She is currently not interested in Bariatric Surgrery  -Nutritionist Consulted and recommending Continuing 1 packet of Juven BID and Ensure Max BID as well as weighing the patient daily   Chronic Pain -C/w Oxycodone 10 mg q4hprn Breakthrough Pain, Diclofenac 2 gram topically 4 times daily PRN, Acetaminophen 1000 mg po qHS and Acetaminophen 650 mg po q6hprn -Will add Ketorolac 10 mg po q8hprn Moderate/Severe Pain   Hx of Depression -Currently no SI/HI -Was offered a Production designer, theatre/television/film  Disposition -She is not a safe discharge disposition as she lives alone and is unable to care for self as she is nonambulatory -No plans for SNF and social worker is assisting with placement -She has to have a target weight goal of 450 pounds prior to discharging to SNF  DVT prophylaxis:  Anticoagulated with Rivaroxaban 10 mg po daily  Code Status: FULL CODE  Family Communication: No family present at beds Disposition Plan: SNF when bed available   Status is: Inpatient  Remains inpatient appropriate because:Unsafe d/c plan, IV treatments appropriate due to intensity of illness or inability to take PO, and Inpatient level of care appropriate due to severity of illness  Dispo: The patient is from: Home              Anticipated d/c is to: SNF              Patient currently is not medically stable to d/c.   Difficult to place patient Yes  Consultants:  None  Procedures:  None  Antimicrobials: Anti-infectives (From admission, onward)    Start     Dose/Rate Route Frequency Ordered Stop   10/11/20 1000  vancomycin (VANCOREADY) IVPB 2000 mg/400 mL  Status:  Discontinued        2,000 mg 200 mL/hr over 120 Minutes Intravenous Every 12 hours 10/10/20 2047 10/11/20 1140   10/11/20 0100  ceFEPIme (MAXIPIME) 2 g in sodium chloride 0.9 % 100 mL IVPB  Status:  Discontinued        2 g 200 mL/hr over 30 Minutes Intravenous Every 8 hours 10/10/20 2001 10/11/20 1140   10/10/20 2100  vancomycin (VANCOCIN) 2,500 mg in sodium chloride 0.9 % 500 mL IVPB        2,500 mg 250 mL/hr over 120 Minutes Intravenous  Once 10/10/20 2001 10/11/20 0135   10/10/20 1545  ceFAZolin (ANCEF) IVPB 2g/100 mL premix        2 g 200 mL/hr over 30 Minutes Intravenous  Once 10/10/20 1530 10/10/20 1723        Subjective: Seen and examined and she thinks she is doing better with the addition of the ketorolac.  No nausea or vomiting.  Denies any lightheadedness or dizziness.  Had a bowel movement a few days ago.  No other concerns or complaints at this time.  Objective: Vitals:   12/05/20 1345 12/05/20 2145 12/06/20 0511 12/06/20 0511  BP: 119/69 128/73 (!) 147/75   Pulse: 85 97 87 88  Resp:  19 18   Temp:  98.5 F (36.9 C) 98.3 F (36.8 C)   TempSrc:  Oral    SpO2: 94% 97% (!) 87% 95%  Weight:       Height:        Intake/Output Summary (Last 24 hours) at 12/06/2020 1330 Last data filed at 12/06/2020 1203 Gross per 24 hour  Intake 290 ml  Output 1250 ml  Net -960 ml    Filed Weights   11/30/20 0600 12/02/20 0446 12/04/20 0506  Weight: (!) 216.7 kg (!) 217.4 kg (!) 219 kg   Examination: Physical Exam:  Constitutional: WN/WD super morbidly obese African-American female currently in no acute distress appears calm  Eyes: Lids and conjunctivae normal, sclerae anicteric  ENMT: External Ears, Nose appear normal. Grossly normal hearing. Neck: Appears normal, supple, no cervical masses, normal ROM, no appreciable thyromegaly; Difficult to assess JVD status given her body habitus Respiratory: Diminished to auscultation bilaterally, no wheezing, rales, rhonchi or crackles. Normal respiratory effort and  patient is not tachypenic. No accessory muscle use. Unlabored breathing  Cardiovascular: RRR, no murmurs / rubs / gallops. S1 and S2 auscultated. Has LE venous stasis changes and Lymphedema Abdomen: Soft, non-tender, Distended 2/2 to body habitus. Bowel sounds positive.  GU: Deferred. Musculoskeletal: No clubbing / cyanosis of digits/nails. No joint deformity upper and lower extremities.  Skin: Has a posterior Left Leg wound. No induration; Warm and dry.  Neurologic: CN 2-12 grossly intact with no focal deficits. Romberg sign and cerebellar reflexes not assessed.  Psychiatric: Normal judgment and insight. Alert and oriented x 3. Normal mood and appropriate affect.   Data Reviewed: I have personally reviewed following labs and imaging studies  CBC: Recent Labs  Lab 12/04/20 0310  WBC 9.2  NEUTROABS 6.4  HGB 13.0  HCT 43.1  MCV 96.2  PLT 962    Basic Metabolic Panel: Recent Labs  Lab 12/04/20 0310  NA 136  K 4.0  CL 104  CO2 26  GLUCOSE 172*  BUN 21*  CREATININE 0.74  CALCIUM 8.9  MG 1.6*  PHOS 5.1*    GFR: Estimated Creatinine Clearance: 167.1 mL/min (by C-G  formula based on SCr of 0.74 mg/dL). Liver Function Tests: Recent Labs  Lab 12/04/20 0310  AST 20  ALT 18  ALKPHOS 72  BILITOT 0.7  PROT 7.6  ALBUMIN 3.4*    No results for input(s): LIPASE, AMYLASE in the last 168 hours. No results for input(s): AMMONIA in the last 168 hours. Coagulation Profile: No results for input(s): INR, PROTIME in the last 168 hours. Cardiac Enzymes: No results for input(s): CKTOTAL, CKMB, CKMBINDEX, TROPONINI in the last 168 hours. BNP (last 3 results) No results for input(s): PROBNP in the last 8760 hours. HbA1C: No results for input(s): HGBA1C in the last 72 hours. CBG: Recent Labs  Lab 12/05/20 1206 12/05/20 1627 12/05/20 2143 12/06/20 0724 12/06/20 1159  GLUCAP 193* 149* 165* 167* 183*    Lipid Profile: No results for input(s): CHOL, HDL, LDLCALC, TRIG, CHOLHDL, LDLDIRECT in the last 72 hours. Thyroid Function Tests: No results for input(s): TSH, T4TOTAL, FREET4, T3FREE, THYROIDAB in the last 72 hours. Anemia Panel: No results for input(s): VITAMINB12, FOLATE, FERRITIN, TIBC, IRON, RETICCTPCT in the last 72 hours. Sepsis Labs: No results for input(s): PROCALCITON, LATICACIDVEN in the last 168 hours.  No results found for this or any previous visit (from the past 240 hour(s)).   RN Pressure Injury Documentation: Pressure Injury 10/11/20 Thigh Left;Posterior (Active)  10/11/20 0041  Location: Thigh  Location Orientation: Left;Posterior  Staging:   Wound Description (Comments):   Present on Admission:      Pressure Injury 10/11/20 Tibial Left;Posterior;Proximal (Active)  10/11/20 0041  Location: Tibial  Location Orientation: Left;Posterior;Proximal  Staging:   Wound Description (Comments):   Present on Admission:      Estimated body mass index is 69.28 kg/m as calculated from the following:   Height as of this encounter: 5\' 10"  (1.778 m).   Weight as of this encounter: 219 kg.  Malnutrition Type:  Nutrition Problem:  Increased nutrient needs Etiology: wound healing   Malnutrition Characteristics:  Signs/Symptoms: estimated needs   Nutrition Interventions:  Interventions: Juven, Other (Comment) (Ensure Max)   Radiology Studies: No results found.  Scheduled Meds:  acetaminophen  1,000 mg Oral QHS   ferrous sulfate  325 mg Oral Daily   hydrocortisone cream   Topical BID   hydrOXYzine  10 mg Oral TID   loratadine  10 mg Oral QPM  magnesium oxide  800 mg Oral BID   metFORMIN  500 mg Oral BID WC   metoprolol tartrate  12.5 mg Oral BID   nutrition supplement (JUVEN)  1 packet Oral BID BM   potassium chloride  40 mEq Oral Daily   Ensure Max Protein  11 oz Oral BID   rivaroxaban  10 mg Oral Daily   senna  1 tablet Oral BID   sitaGLIPtin  100 mg Oral Daily   Continuous Infusions:  sodium chloride Stopped (11/15/20 1816)    LOS: 27 days   Kerney Elbe, DO [facility] PAGER is on [facility]  If 7PM-7AM, please contact night-coverage [email or web address]

## 2020-12-06 NOTE — Progress Notes (Signed)
PT Cancellation Note  Patient Details Name: [name] MRN: [id number] DOB: [birth year]   Cancelled Treatment:      Pt refused session today stating she has been in great pain all week due to the weather and is today as well. Due to my timing, she stated she was waiting for lunch and then pain meds later for her dressing change that she preferred to wait until Monday.    Laurette Schimke, PT, MPT Acute Rehabilitation Services Office: [phone] Pager: [phone] 12/06/2020 12/06/2020, 5:46 PM

## 2020-12-07 LAB — GLUCOSE, CAPILLARY
Glucose-Capillary: 163 mg/dL — ABNORMAL HIGH (ref 70–99)
Glucose-Capillary: 183 mg/dL — ABNORMAL HIGH (ref 70–99)
Glucose-Capillary: 182 mg/dL — ABNORMAL HIGH (ref 70–99)
Glucose-Capillary: 175 mg/dL — ABNORMAL HIGH (ref 70–99)

## 2020-12-07 NOTE — Progress Notes (Signed)
PROGRESS NOTE    ZI SEK  WRU:[id number] DOB: 1965-10-03 DOA: 10/10/2020 PCP: Fanny Bien, MD   Brief Narrative:  The patient is a super morbidly obese African-American female with a past medical history significant for but not limited to diabetes mellitus type 2, essential hypertension, chronic lymphedema as well as other comorbidities who was admitted and treated for skin breakdown and concern for a infected wound.  She is currently nonambulatory and lives alone and unable to care for self.  She has completed antibiotics and has been deemed medically stable to be discharged however disposition is difficult given her nonambulatory status and because SNF requires the patient to be under 450 pounds for her bariatric bed and currently she is 482. Patient's Pain is improved with the addition of PRN Ketorolac but she continues to have some spasms. PT to re-evaluate  Assessment & Plan:   Principal Problem:   Wound infection Active Problems:   Hyperkalemia   Chronic peripheral venous hypertension with lower extremity complication   Lymphedema   Obesity   Type 2 diabetes mellitus (HCC)   HTN (hypertension)   Cellulitis  Chronic bilateral lower extremity lymphedema with Chronic Non-healing Ulcers and concern for infected wound  -Stable; continue lymphedema pumps -Completed Vancomycin  -PT OT to evaluate and treat and once bariatric bed is available at SNF can be discharged; Patient has been discharged from an OT standpoint   Diabetes Mellitus Type 2 -Fairly well controlled with a hemoglobin A1c of 6.5 -Discussed supplement regimen with nutrition registered dietitian as well -Had extensive discussions of glucose control while in the hospital.  Patient did not want any sliding scale insulin while she was hospitalized so Januvia was started on 11/30/2020 by previous provider.  The patient spent $50 for this prescription as it is nonformulary and wanted to give it a try however  now she is agreeable to starting metformin XR; she was hesitant given her history of GI upset several years ago and she is willing to try the ER formulation and this was started last night  -We we will start metformin 500 mg XR twice daily; Now discontinuing Januvia 100 mg for now given that the hospital does not carry it on Formulary.  -Continue to monitor CBGs carefully; these ranging from 149-227 -Diabetes education coordinator consulted for further assistance in recommended starting Amaryl 1 mg p.o. daily however the patient does not want to pursue this option currently  Essential Hypertension -Diuretics has been discontinued  -C/w Metoprolol Tartrate 12.5 mg po BID -Continue to Monitor BP per Protocol -Last BP was 137/84  Hypomagnesemia -Patient's Mag Level was 1.6 on last check  -Was wanting to Replete with IV Mag Sulfate 2 grams but patient has no IV and patient refuses  -Continue Repletion with Mag Oxide 800 mg po BID -Continue to Monitor and Replete as Necessary   Super Morbid Obesity -Complicates overall prognosis and care -Estimated body mass index is 68.93 kg/m as calculated from the following:   Height as of this encounter: 5\' 10"  (1.778 m).   Weight as of this encounter: 217.9 kg. -Weight Loss and Dietary Counseling given; She is currently not interested in Bariatric Surgrery  -Nutritionist Consulted and recommending Continuing 1 packet of Juven BID and Ensure Max BID as well as weighing the patient daily   Chronic Pain -C/w Oxycodone 10 mg q4hprn Breakthrough Pain, Diclofenac 2 gram topically 4 times daily PRN, Acetaminophen 1000 mg po qHS and Acetaminophen 650 mg po q6hprn -Added Ketorolac 10  mg po q8hprn Moderate/Severe Pain   Hx of Depression -Currently no SI/HI -Was offered a Psych Consult  Disposition -She is not a safe discharge disposition as she lives alone and is unable to care for self as she is nonambulatory -No plans for SNF and social worker is  assisting with placement -She has to have a target weight goal of 450 pounds prior to discharging to SNF  DVT prophylaxis: Anticoagulated with Rivaroxaban 10 mg po daily  Code Status: FULL CODE  Family Communication: No family present at beds Disposition Plan: SNF when bed available   Status is: Inpatient  Remains inpatient appropriate because:Unsafe d/c plan, IV treatments appropriate due to intensity of illness or inability to take PO, and Inpatient level of care appropriate due to severity of illness  Dispo: The patient is from: Home              Anticipated d/c is to: SNF              Patient currently is not medically stable to d/c.   Difficult to place patient Yes  Consultants:  None  Procedures:  None  Antimicrobials: Anti-infectives (From admission, onward)    Start     Dose/Rate Route Frequency Ordered Stop   10/11/20 1000  vancomycin (VANCOREADY) IVPB 2000 mg/400 mL  Status:  Discontinued        2,000 mg 200 mL/hr over 120 Minutes Intravenous Every 12 hours 10/10/20 2047 10/11/20 1140   10/11/20 0100  ceFEPIme (MAXIPIME) 2 g in sodium chloride 0.9 % 100 mL IVPB  Status:  Discontinued        2 g 200 mL/hr over 30 Minutes Intravenous Every 8 hours 10/10/20 2001 10/11/20 1140   10/10/20 2100  vancomycin (VANCOCIN) 2,500 mg in sodium chloride 0.9 % 500 mL IVPB        2,500 mg 250 mL/hr over 120 Minutes Intravenous  Once 10/10/20 2001 10/11/20 0135   10/10/20 1545  ceFAZolin (ANCEF) IVPB 2g/100 mL premix        2 g 200 mL/hr over 30 Minutes Intravenous  Once 10/10/20 1530 10/10/20 1723        Subjective: Seen and examined and still in pain but states it is manageable.  No nausea or vomiting.  Awaiting for PT to evaluate her today.  No lightheadedness or dizziness.  Had leg spasms.  No other concerns or complaints at this time.  Objective: Vitals:   12/06/20 0511 12/06/20 1420 12/06/20 2123 12/07/20 0527  BP:  114/65 124/64 137/84  Pulse: 88 86 97 89  Resp:  18  16 16   Temp:  98.2 F (36.8 C) 98.1 F (36.7 C) 98 F (36.7 C)  TempSrc:   Oral Oral  SpO2: 95% 95% 99% 93%  Weight:    (!) 217.9 kg  Height:        Intake/Output Summary (Last 24 hours) at 12/07/2020 1259 Last data filed at 12/07/2020 0744 Gross per 24 hour  Intake 510 ml  Output 750 ml  Net -240 ml    Filed Weights   12/02/20 0446 12/04/20 0506 12/07/20 0527  Weight: (!) 217.4 kg (!) 219 kg (!) 217.9 kg   Examination: Physical Exam:  Constitutional: WN/WD super morbidly obese AAF currently in NAD appears calm but slightly uncomfortable  Eyes: Lids and conjunctivae normal, sclerae anicteric  ENMT: External Ears, Nose appear normal. Grossly normal hearing.  Neck: Appears normal, supple, no cervical masses, normal ROM, no appreciable thyromegaly; Difficult to assess  JVD status given body habitus Respiratory: Diminished to auscultation bilaterally, no wheezing, rales, rhonchi or crackles. Normal respiratory effort and patient is not tachypenic. No accessory muscle use. Unlabored breathing  Cardiovascular: RRR, no murmurs / rubs / gallops. S1 and S2 auscultated. Has LE venous stasis changes and lymphedema  Abdomen: Soft, non-tender, Distended 2/2 to body habitus. Bowel sounds positive.  GU: Deferred. Musculoskeletal: No clubbing / cyanosis of digits/nails. No joint deformity upper and lower extremities.  Skin: Has a posterior Left Leg wound. No induration; Warm and dry.  Neurologic: CN 2-12 grossly intact with no focal deficits. Romberg sign and cerebellar reflexes not assessed.  Psychiatric: Normal judgment and insight. Alert and oriented x 3. Normal mood and appropriate affect.   Data Reviewed: I have personally reviewed following labs and imaging studies  CBC: Recent Labs  Lab 12/04/20 0310  WBC 9.2  NEUTROABS 6.4  HGB 13.0  HCT 43.1  MCV 96.2  PLT 400    Basic Metabolic Panel: Recent Labs  Lab 12/04/20 0310  NA 136  K 4.0  CL 104  CO2 26  GLUCOSE 172*  BUN  21*  CREATININE 0.74  CALCIUM 8.9  MG 1.6*  PHOS 5.1*    GFR: Estimated Creatinine Clearance: 166.6 mL/min (by C-G formula based on SCr of 0.74 mg/dL). Liver Function Tests: Recent Labs  Lab 12/04/20 0310  AST 20  ALT 18  ALKPHOS 72  BILITOT 0.7  PROT 7.6  ALBUMIN 3.4*    No results for input(s): LIPASE, AMYLASE in the last 168 hours. No results for input(s): AMMONIA in the last 168 hours. Coagulation Profile: No results for input(s): INR, PROTIME in the last 168 hours. Cardiac Enzymes: No results for input(s): CKTOTAL, CKMB, CKMBINDEX, TROPONINI in the last 168 hours. BNP (last 3 results) No results for input(s): PROBNP in the last 8760 hours. HbA1C: No results for input(s): HGBA1C in the last 72 hours. CBG: Recent Labs  Lab 12/06/20 1159 12/06/20 1718 12/06/20 2127 12/07/20 0738 12/07/20 1132  GLUCAP 183* 167* 227* 163* 183*    Lipid Profile: No results for input(s): CHOL, HDL, LDLCALC, TRIG, CHOLHDL, LDLDIRECT in the last 72 hours. Thyroid Function Tests: No results for input(s): TSH, T4TOTAL, FREET4, T3FREE, THYROIDAB in the last 72 hours. Anemia Panel: No results for input(s): VITAMINB12, FOLATE, FERRITIN, TIBC, IRON, RETICCTPCT in the last 72 hours. Sepsis Labs: No results for input(s): PROCALCITON, LATICACIDVEN in the last 168 hours.  No results found for this or any previous visit (from the past 240 hour(s)).   RN Pressure Injury Documentation: Pressure Injury 10/11/20 Thigh Left;Posterior (Active)  10/11/20 0041  Location: Thigh  Location Orientation: Left;Posterior  Staging:   Wound Description (Comments):   Present on Admission:      Pressure Injury 10/11/20 Tibial Left;Posterior;Proximal (Active)  10/11/20 0041  Location: Tibial  Location Orientation: Left;Posterior;Proximal  Staging:   Wound Description (Comments):   Present on Admission:      Estimated body mass index is 68.93 kg/m as calculated from the following:   Height as of  this encounter: 5\' 10"  (1.778 m).   Weight as of this encounter: 217.9 kg.  Malnutrition Type:  Nutrition Problem: Increased nutrient needs Etiology: wound healing   Malnutrition Characteristics:  Signs/Symptoms: estimated needs   Nutrition Interventions:  Interventions: Juven, Other (Comment) (Ensure Max)   Radiology Studies: No results found.  Scheduled Meds:  acetaminophen  1,000 mg Oral QHS   ferrous sulfate  325 mg Oral Daily   hydrocortisone cream  Topical BID   hydrOXYzine  10 mg Oral TID   loratadine  10 mg Oral QPM   magnesium oxide  800 mg Oral BID   metFORMIN  500 mg Oral BID WC   metoprolol tartrate  12.5 mg Oral BID   nutrition supplement (JUVEN)  1 packet Oral BID BM   potassium chloride  40 mEq Oral Daily   Ensure Max Protein  11 oz Oral BID   rivaroxaban  10 mg Oral Daily   senna  1 tablet Oral BID   Continuous Infusions:  sodium chloride Stopped (11/15/20 1816)    LOS: 29 days   Kerney Elbe, DO [facility] PAGER is on [place]  If 7PM-7AM, please contact night-coverage [email or web address]

## 2020-12-07 NOTE — Progress Notes (Signed)
Physical Therapy Treatment Patient Details Name: [name] MRN: [id number] DOB: 1965/11/21 Today's Date: 12/07/2020    History of Present Illness Pt admitted from home with multiple LE wounds, and FTT and unable to perform basic self care.  Pt reports she stayed in lift chair getting up only twice a day for BM or getting food.  Pt utilizing Rollator around home but pivoted to it from lift chair and wheeled around in seated position.  Pt states she put pads and diapers in chair so she did not have to get up to urinate.  Pt with hx of DM and lymphadema (has been unable to use her pressure sleeves so lymphadema has significantly worsened since last December).    PT Comments    Today I saw some small progress since the last time I saw her as a PT several weeks ago. She tolerated LE exercises better, with noticeable active muscle contractions and slight movment at knees and hips , and slight increase in hip ABduction. Pt  tolerated it better with no c/o of muscle cramps during the exercises and tilt. During tilt pt's legs just fatigued and began to shake at the end of 15 minutes.  Discuss need to continue to make small gains, but also work on larger gains in order to make any functional progression or else she will be bedbound. Goals for this week are to get OOB in recliner at least 4 days out of 7, sit in chair position and see if she can do arm bike ( and if so this will be something she can set up with nursing herself), and get in recliner and go outside.   Follow Up Recommendations  SNF     Equipment Recommendations       Recommendations for Other Services       Precautions / Restrictions      Mobility  Bed Mobility                 Start Time: 1310 Angle: 45 degrees (82 kg weight bearing) Total Minutes in Angle: 15 minutes  Transfers                    Ambulation/Gait                 Stairs             Wheelchair Mobility    Modified  Rankin (Stroke Patients Only)       Balance                                            Cognition Arousal/Alertness: Awake/alert Behavior During Therapy: WFL for tasks assessed/performed Overall Cognitive Status: Within Functional Limits for tasks assessed                                        Exercises General Exercises - Lower Extremity Ankle Circles/Pumps: AROM;Both;10 reps;Limitations Gluteal Sets: AROM;Standing (in tilt position) Heel Slides: AAROM;Both;Limitations;10 reps Heel Slides Limitations: pain, body habitus, knee flexion very little but did see more ability to tolerate slight improvement Hip ABduction/ADduction: AAROM;Supine;Both;10 reps    General Comments        Pertinent Vitals/Pain Pain Assessment: Faces Faces Pain Scale: Hurts little more Pain Location: mostly LEs with movement, and  R knee and hip more than L. However pt overall seemed to tolerate session today better than she has in the past with less c/o of spasms Pain Descriptors / Indicators: Grimacing;Moaning Pain Intervention(s): Monitored during session    Home Living                      Prior Function            PT Goals (current goals can now be found in the care plan section) Acute Rehab PT Goals Patient Stated Goal: manage myself and go home PT Goal Formulation: With patient Time For Goal Achievement: 12/12/20 Potential to Achieve Goals: Fair Additional Goals Additional Goal #1: tolerate tilt standing bed x 20 mins at 50 degrees. Progress towards PT goals: Progressing toward goals (very slow progression)    Frequency    Min 2X/week      PT Plan Current plan remains appropriate    Co-evaluation              AM-PAC PT "6 Clicks" Mobility   Outcome Measure  Help needed turning from your back to your side while in a flat bed without using bedrails?: Total Help needed moving from lying on your back to sitting on the side of a  flat bed without using bedrails?: Total Help needed moving to and from a bed to a chair (including a wheelchair)?: Total Help needed standing up from a chair using your arms (e.g., wheelchair or bedside chair)?: Total Help needed to walk in hospital room?: Total Help needed climbing 3-5 steps with a railing? : Total 6 Click Score: 6    End of Session Equipment Utilized During Treatment: Other (comment) (tilt bed) Activity Tolerance: Patient tolerated treatment well;Patient limited by fatigue;Patient limited by pain Patient left: in bed;with family/visitor present Nurse Communication: Mobility status;Need for lift equipment PT Visit Diagnosis: Muscle weakness (generalized) (M62.81);Difficulty in walking, not elsewhere classified (R26.2);Pain;Adult, failure to thrive (R62.7) Pain - Right/Left: Right Pain - part of body: Knee;Leg;Hip     Time: 9977-4142 PT Time Calculation (min) (ACUTE ONLY): 48 min  Charges:  $Therapeutic Exercise: 8-22 mins $Therapeutic Activity: 23-37 mins                    Annamae Shivley, PT, MPT Acute Rehabilitation Services Office: [phone] Pager: [phone] 12/07/2020    [name] 12/07/2020, 5:07 PM

## 2020-12-08 LAB — GLUCOSE, CAPILLARY
Glucose-Capillary: 153 mg/dL — ABNORMAL HIGH (ref 70–99)
Glucose-Capillary: 221 mg/dL — ABNORMAL HIGH (ref 70–99)
Glucose-Capillary: 169 mg/dL — ABNORMAL HIGH (ref 70–99)
Glucose-Capillary: 164 mg/dL — ABNORMAL HIGH (ref 70–99)

## 2020-12-08 NOTE — Plan of Care (Signed)
  Problem: Clinical Measurements: Goal: Diagnostic test results will improve Outcome: Progressing   Problem: Clinical Measurements: Goal: Cardiovascular complication will be avoided Outcome: Progressing   Problem: Activity: Goal: Risk for activity intolerance will decrease Outcome: Progressing   Problem: Elimination: Goal: Will not experience complications related to bowel motility Outcome: Progressing

## 2020-12-08 NOTE — Plan of Care (Signed)
?  Problem: Nutrition: ?Goal: Adequate nutrition will be maintained ?Outcome: Progressing ?  ?Problem: Coping: ?Goal: Level of anxiety will decrease ?Outcome: Progressing ?  ?Problem: Elimination: ?Goal: Will not experience complications related to urinary retention ?Outcome: Progressing ?  ?

## 2020-12-08 NOTE — Progress Notes (Signed)
PROGRESS NOTE    [name]  YHC:[id number] DOB: 1965-10-27 DOA: 10/10/2020 PCP: Fanny Bien, MD   Brief Narrative:  The patient is a super morbidly obese African-American female with a past medical history significant for but not limited to diabetes mellitus type 2, essential hypertension, chronic lymphedema as well as other comorbidities who was admitted and treated for skin breakdown and concern for a infected wound.  She is currently nonambulatory and lives alone and unable to care for self.  She has completed antibiotics and has been deemed medically stable to be discharged however disposition is difficult given her nonambulatory status and because SNF requires the patient to be under 450 pounds for her bariatric bed and currently she is 482. Patient's Pain is improved with the addition of PRN Ketorolac but she continues to have some spasms. PT to re-evaluate and goals for this week are to get OOB in the recliner at least 4 out of the 7 days.  Assessment & Plan:   Principal Problem:   Wound infection Active Problems:   Hyperkalemia   Chronic peripheral venous hypertension with lower extremity complication   Lymphedema   Obesity   Type 2 diabetes mellitus (HCC)   HTN (hypertension)   Cellulitis  Chronic bilateral lower extremity lymphedema with Chronic Non-healing Ulcers and concern for infected wound  -Stable; continue lymphedema pumps -Completed Vancomycin  -PT OT to evaluate and treat and once bariatric bed is available at SNF can be discharged; Patient has been discharged from an OT standpoint   Diabetes Mellitus Type 2 -Fairly well controlled with a hemoglobin A1c of 6.5 -Discussed supplement regimen with nutrition registered dietitian as well -Had extensive discussions of glucose control while in the hospital.  Patient did not want any sliding scale insulin while she was hospitalized so Januvia was started on 11/30/2020 by previous provider.  The patient spent  $50 for this prescription as it is nonformulary and wanted to give it a try however now she is agreeable to starting metformin XR; she was hesitant given her history of GI upset several years ago and she is willing to try the ER formulation and this was started last night  -We we will start metformin 500 mg XR twice daily; Now discontinuing Januvia 100 mg for now given that the hospital does not carry it on Formulary.  -Continue to monitor CBGs carefully; these ranging from 153-221 -Diabetes education coordinator consulted for further assistance in recommended starting Amaryl 1 mg p.o. daily however the patient does not want to pursue this option currently  Essential Hypertension -Diuretics has been discontinued  -C/w Metoprolol Tartrate 12.5 mg po BID -Continue to Monitor BP per Protocol -Last BP was 118/63  Hypomagnesemia -Patient's Mag Level was 1.6 on last check  -Was wanting to Replete with IV Mag Sulfate 2 grams but patient has no IV and patient refuses  -Continue Repletion with Mag Oxide 800 mg po BID -Continue to Monitor and Replete as Necessary   Super Morbid Obesity -Complicates overall prognosis and care -Estimated body mass index is 67.85 kg/m as calculated from the following:   Height as of this encounter: 5\' 10"  (1.778 m).   Weight as of this encounter: 214.5 kg. -Weight Loss and Dietary Counseling given; She is currently not interested in Bariatric Surgrery  -Nutritionist Consulted and recommending Continuing 1 packet of Juven BID and Ensure Max BID as well as weighing the patient daily   Chronic Pain -C/w Oxycodone 10 mg q4hprn Breakthrough Pain, Diclofenac 2  gram topically 4 times daily PRN, Acetaminophen 1000 mg po qHS and Acetaminophen 650 mg po q6hprn -Added Ketorolac 10 mg po q8hprn Moderate/Severe Pain   Hx of Depression -Currently no SI/HI -Was offered a Psych Consult  Disposition -She is not a safe discharge disposition as she lives alone and is unable to  care for self as she is nonambulatory -No plans for SNF and social worker is assisting with placement -She has to have a target weight goal of 450 pounds prior to discharging to SNF  DVT prophylaxis: Anticoagulated with Rivaroxaban 10 mg po daily  Code Status: FULL CODE  Family Communication: No family present at beds Disposition Plan: SNF when bed available   Status is: Inpatient  Remains inpatient appropriate because:Unsafe d/c plan, IV treatments appropriate due to intensity of illness or inability to take PO, and Inpatient level of care appropriate due to severity of illness  Dispo: The patient is from: Home              Anticipated d/c is to: SNF              Patient currently is not medically stable to d/c.   Difficult to place patient Yes  Consultants:  None  Procedures:  None  Antimicrobials: Anti-infectives (From admission, onward)    Start     Dose/Rate Route Frequency Ordered Stop   10/11/20 1000  vancomycin (VANCOREADY) IVPB 2000 mg/400 mL  Status:  Discontinued        2,000 mg 200 mL/hr over 120 Minutes Intravenous Every 12 hours 10/10/20 2047 10/11/20 1140   10/11/20 0100  ceFEPIme (MAXIPIME) 2 g in sodium chloride 0.9 % 100 mL IVPB  Status:  Discontinued        2 g 200 mL/hr over 30 Minutes Intravenous Every 8 hours 10/10/20 2001 10/11/20 1140   10/10/20 2100  vancomycin (VANCOCIN) 2,500 mg in sodium chloride 0.9 % 500 mL IVPB        2,500 mg 250 mL/hr over 120 Minutes Intravenous  Once 10/10/20 2001 10/11/20 0135   10/10/20 1545  ceFAZolin (ANCEF) IVPB 2g/100 mL premix        2 g 200 mL/hr over 30 Minutes Intravenous  Once 10/10/20 1530 10/10/20 1723        Subjective: Seen and examined and still in pain. Was able to tilt with Therapy yesterday. Does not like taking the potassium supplements so it was stopped. No CP or SOB. No other concerns or complaints at this time.   Objective: Vitals:   12/07/20 2043 12/08/20 0500 12/08/20 0549 12/08/20 1308  BP:  110/64  134/84 118/63  Pulse: 96  86 85  Resp: 20  16 17   Temp: 98.7 F (37.1 C)  97.7 F (36.5 C) 98.5 F (36.9 C)  TempSrc: Oral  Oral Oral  SpO2: 95%  92% 93%  Weight:  (!) 214.5 kg    Height:        Intake/Output Summary (Last 24 hours) at 12/08/2020 1310 Last data filed at 12/08/2020 0730 Gross per 24 hour  Intake 720 ml  Output 1600 ml  Net -880 ml    Filed Weights   12/04/20 0506 12/07/20 0527 12/08/20 0500  Weight: (!) 219 kg (!) 217.9 kg (!) 214.5 kg   Examination: Physical Exam:  Constitutional: WN/WD super morbidly obese AAF in NAD and appears calm but slightly uncomfortable Eyes:  Lids and conjunctivae normal, sclerae anicteric  ENMT: External Ears, Nose appear normal. Grossly normal hearing.  Neck: Appears normal, supple, no cervical masses, normal ROM, no appreciable thyromegaly; Difficult to assess JVD status given body habitus Respiratory: Diminished to auscultation bilaterally, no wheezing, rales, rhonchi or crackles. Normal respiratory effort and patient is not tachypenic. No accessory muscle use. Unlabored breathing Cardiovascular: RRR, no murmurs / rubs / gallops. S1 and S2 auscultated. Has LE venous stasis changes and lymphedema Abdomen: Soft, non-tender, Distended 2/2 to body habitus. Bowel sounds positive.  GU: Deferred. Musculoskeletal: No clubbing / cyanosis of digits/nails. No joint deformity upper and lower extremities.  Skin: Has a posterior Left Leg wound. No induration; Warm and dry.  Neurologic: CN 2-12 grossly intact with no focal deficits. Romberg sign and cerebellar reflexes not assessed.  Psychiatric: Normal judgment and insight. Alert and oriented x 3. Normal mood and appropriate affect.   Data Reviewed: I have personally reviewed following labs and imaging studies  CBC: Recent Labs  Lab 12/04/20 0310  WBC 9.2  NEUTROABS 6.4  HGB 13.0  HCT 43.1  MCV 96.2  PLT 409    Basic Metabolic Panel: Recent Labs  Lab 12/04/20 0310  NA  136  K 4.0  CL 104  CO2 26  GLUCOSE 172*  BUN 21*  CREATININE 0.74  CALCIUM 8.9  MG 1.6*  PHOS 5.1*    GFR: Estimated Creatinine Clearance: 164.8 mL/min (by C-G formula based on SCr of 0.74 mg/dL). Liver Function Tests: Recent Labs  Lab 12/04/20 0310  AST 20  ALT 18  ALKPHOS 72  BILITOT 0.7  PROT 7.6  ALBUMIN 3.4*    No results for input(s): LIPASE, AMYLASE in the last 168 hours. No results for input(s): AMMONIA in the last 168 hours. Coagulation Profile: No results for input(s): INR, PROTIME in the last 168 hours. Cardiac Enzymes: No results for input(s): CKTOTAL, CKMB, CKMBINDEX, TROPONINI in the last 168 hours. BNP (last 3 results) No results for input(s): PROBNP in the last 8760 hours. HbA1C: No results for input(s): HGBA1C in the last 72 hours. CBG: Recent Labs  Lab 12/07/20 1132 12/07/20 1656 12/07/20 2132 12/08/20 0735 12/08/20 1122  GLUCAP 183* 182* 175* 153* 221*    Lipid Profile: No results for input(s): CHOL, HDL, LDLCALC, TRIG, CHOLHDL, LDLDIRECT in the last 72 hours. Thyroid Function Tests: No results for input(s): TSH, T4TOTAL, FREET4, T3FREE, THYROIDAB in the last 72 hours. Anemia Panel: No results for input(s): VITAMINB12, FOLATE, FERRITIN, TIBC, IRON, RETICCTPCT in the last 72 hours. Sepsis Labs: No results for input(s): PROCALCITON, LATICACIDVEN in the last 168 hours.  No results found for this or any previous visit (from the past 240 hour(s)).   RN Pressure Injury Documentation: Pressure Injury 10/11/20 Thigh Left;Posterior (Active)  10/11/20 0041  Location: Thigh  Location Orientation: Left;Posterior  Staging:   Wound Description (Comments):   Present on Admission:      Pressure Injury 10/11/20 Tibial Left;Posterior;Proximal (Active)  10/11/20 0041  Location: Tibial  Location Orientation: Left;Posterior;Proximal  Staging:   Wound Description (Comments):   Present on Admission:      Estimated body mass index is 67.85 kg/m  as calculated from the following:   Height as of this encounter: 5\' 10"  (1.778 m).   Weight as of this encounter: 214.5 kg.  Malnutrition Type:  Nutrition Problem: Increased nutrient needs Etiology: wound healing   Malnutrition Characteristics:  Signs/Symptoms: estimated needs   Nutrition Interventions:  Interventions: Juven, Other (Comment) (Ensure Max)   Radiology Studies: No results found.  Scheduled Meds:  acetaminophen  1,000 mg Oral QHS  ferrous sulfate  325 mg Oral Daily   hydrocortisone cream   Topical BID   hydrOXYzine  10 mg Oral TID   loratadine  10 mg Oral QPM   magnesium oxide  800 mg Oral BID   metFORMIN  500 mg Oral BID WC   metoprolol tartrate  12.5 mg Oral BID   nutrition supplement (JUVEN)  1 packet Oral BID BM   Ensure Max Protein  11 oz Oral BID   rivaroxaban  10 mg Oral Daily   senna  1 tablet Oral BID   Continuous Infusions:  sodium chloride Stopped (11/15/20 1816)    LOS: 54 days   Kerney Elbe, DO [facility] PAGER is on AMION  If 7PM-7AM, please contact night-coverage [email or web address]

## 2020-12-09 LAB — GLUCOSE, CAPILLARY
Glucose-Capillary: 161 mg/dL — ABNORMAL HIGH (ref 70–99)
Glucose-Capillary: 184 mg/dL — ABNORMAL HIGH (ref 70–99)
Glucose-Capillary: 156 mg/dL — ABNORMAL HIGH (ref 70–99)
Glucose-Capillary: 139 mg/dL — ABNORMAL HIGH (ref 70–99)

## 2020-12-09 NOTE — Plan of Care (Signed)
?  Problem: Nutrition: ?Goal: Adequate nutrition will be maintained ?Outcome: Progressing ?  ?Problem: Coping: ?Goal: Level of anxiety will decrease ?Outcome: Progressing ?  ?Problem: Elimination: ?Goal: Will not experience complications related to urinary retention ?Outcome: Progressing ?  ?

## 2020-12-09 NOTE — Progress Notes (Signed)
Physical Therapy Treatment Patient Details Name: [name] MRN: [id number] DOB: 13-Jul-1965 Today's Date: 12/09/2020    History of Present Illness Pt admitted from home with multiple LE wounds, and FTT and unable to perform basic self care.  Pt reports she stayed in lift chair getting up only twice a day for BM or getting food.  Pt utilizing Rollator around home but pivoted to it from lift chair and wheeled around in seated position.  Pt states she put pads and diapers in chair so she did not have to get up to urinate.  Pt with hx of DM and lymphadema (has been unable to use her pressure sleeves so lymphadema has significantly worsened since last December).    PT Comments    The patient tolerated tilt bed position to 42 * (83kg WB) 8 minutes, Total tilt time from 35-42 15 minutes. Patient  interested in OOB to recliner tomorrow. Continue tilt bed attempts.  Follow Up Recommendations  SNF     Equipment Recommendations  Wheelchair (measurements PT);Wheelchair cushion (measurements PT)    Recommendations for Other Services       Precautions / Restrictions Precautions Precaution Comments: posterior and medial LLE wounds, place bed pad  to cushion when maxiskying.    Mobility  Bed Mobility               General bed mobility comments: Patient able to pull self up in bed when head tilted down Start Time: 1140 Angle: 42 degrees Total Minutes in Angle: 8 minutes (total tilt time from 35-42 15)  Transfers                    Ambulation/Gait                 Stairs             Wheelchair Mobility    Modified Rankin (Stroke Patients Only)       Balance                                            Cognition Arousal/Alertness: Awake/alert                                            Exercises      General Comments        Pertinent Vitals/Pain Faces Pain Scale: Hurts whole lot Pain Location:  initially spasms in posterior right leg, increased with tilting in hips Pain Descriptors / Indicators: Grimacing;Moaning Pain Intervention(s): Monitored during session;Premedicated before session;Repositioned    Home Living                      Prior Function            PT Goals (current goals can now be found in the care plan section) Progress towards PT goals: Progressing toward goals    Frequency    Min 2X/week      PT Plan Current plan remains appropriate    Co-evaluation              AM-PAC PT "6 Clicks" Mobility   Outcome Measure  Help needed turning from your back to your side while in a flat bed without using bedrails?: Total Help needed  moving from lying on your back to sitting on the side of a flat bed without using bedrails?: Total Help needed moving to and from a bed to a chair (including a wheelchair)?: Total Help needed standing up from a chair using your arms (e.g., wheelchair or bedside chair)?: Total Help needed to walk in hospital room?: Total Help needed climbing 3-5 steps with a railing? : Total 6 Click Score: 6    End of Session   Activity Tolerance: Patient limited by pain Patient left: in bed Nurse Communication: Mobility status;Need for lift equipment PT Visit Diagnosis: Muscle weakness (generalized) (M62.81);Difficulty in walking, not elsewhere classified (R26.2);Pain;Adult, failure to thrive (R62.7) Pain - Right/Left: Right Pain - part of body: Knee;Leg;Hip     Time: 1130-1206 PT Time Calculation (min) (ACUTE ONLY): 36 min  Charges:  $Therapeutic Activity: 23-37 mins                    [name] PT Acute Rehabilitation Services Pager [phone] Office [phone]    [name] 12/09/2020, 12:54 PM

## 2020-12-09 NOTE — Progress Notes (Signed)
PROGRESS NOTE    [name]  EVO:[id number] DOB: 04-06-1966 DOA: 10/10/2020 PCP: Fanny Bien, MD   Brief Narrative:  The patient is a super morbidly obese African-American female with a past medical history significant for but not limited to diabetes mellitus type 2, essential hypertension, chronic lymphedema as well as other comorbidities who was admitted and treated for skin breakdown and concern for a infected wound.  She is currently nonambulatory and lives alone and unable to care for self.  She has completed antibiotics and has been deemed medically stable to be discharged however disposition is difficult given her nonambulatory status and because SNF requires the patient to be under 450 pounds for her bariatric bed and currently she is 472. Last documented weight of 222 is NOT Accurate at all.  Patient's Pain is improved with the addition of PRN Ketorolac but she continues to have some spasms. PT to re-evaluate and goals for this week are to get OOB in the recliner at least 4 out of the 7 days.  Assessment & Plan:   Principal Problem:   Wound infection Active Problems:   Hyperkalemia   Chronic peripheral venous hypertension with lower extremity complication   Lymphedema   Obesity   Type 2 diabetes mellitus (HCC)   HTN (hypertension)   Cellulitis  Chronic bilateral lower extremity lymphedema with Chronic Non-healing Ulcers and concern for infected wound  -Stable; continue lymphedema pumps -Completed Vancomycin  -PT OT to evaluate and treat and once bariatric bed is available at SNF can be discharged; Patient has been discharged from an OT standpoint   Diabetes Mellitus Type 2 -Fairly well controlled with a hemoglobin A1c of 6.5 -Discussed supplement regimen with nutrition registered dietitian as well -Had extensive discussions of glucose control while in the hospital.  Patient did not want any sliding scale insulin while she was hospitalized so Januvia was started  on 11/30/2020 by previous provider.  The patient spent $50 for this prescription as it is nonformulary and wanted to give it a try however now she is agreeable to starting metformin XR; she was hesitant given her history of GI upset several years ago and she is willing to try the ER formulation and this was started last night  -We we will start metformin 500 mg XR twice daily; Now discontinuing Januvia 100 mg for now given that the hospital does not carry it on Formulary.  -Continue to monitor CBGs carefully; these ranging from 161-221 -Diabetes education coordinator consulted for further assistance in recommended starting Amaryl 1 mg p.o. daily however the patient does not want to pursue this option currently  Essential Hypertension -Diuretics has been discontinued  -C/w Metoprolol Tartrate 12.5 mg po BID -Continue to Monitor BP per Protocol -Last BP was 122/66  Hypomagnesemia -Patient's Mag Level was 1.6 on last check  -Was wanting to Replete with IV Mag Sulfate 2 grams but patient has no IV and patient refuses  -Continue Repletion with Mag Oxide 800 mg po BID -Continue to Monitor and Replete as Necessary  -Check Labs in the AM   Super Morbid Obesity -Complicates overall prognosis and care -Estimated body mass index is 67.85 kg/m as calculated from the following:   Height as of this encounter: 5\' 10"  (1.778 m).   Weight as of this encounter: 214.5 kg. -Weight Loss and Dietary Counseling given; She is currently not interested in Bariatric Surgrery  -Nutritionist Consulted and recommending Continuing 1 packet of Juven BID and Ensure Max BID as well as  weighing the patient daily   Chronic Pain -C/w Oxycodone 10 mg q4hprn Breakthrough Pain, Diclofenac 2 gram topically 4 times daily PRN, Acetaminophen 1000 mg po qHS and Acetaminophen 650 mg po q6hprn -Added Ketorolac 10 mg po q8hprn Moderate/Severe Pain   Hx of Depression -Currently no SI/HI -Was offered a Psych  Consult  Disposition -She is not a safe discharge disposition as she lives alone and is unable to care for self as she is nonambulatory -No plans for SNF and social worker is assisting with placement -She has to have a target weight goal of 450 pounds prior to discharging to SNF  DVT prophylaxis: Anticoagulated with Rivaroxaban 10 mg po daily  Code Status: FULL CODE  Family Communication: No family present at beds Disposition Plan: SNF when bed available   Status is: Inpatient  Remains inpatient appropriate because:Unsafe d/c plan, IV treatments appropriate due to intensity of illness or inability to take PO, and Inpatient level of care appropriate due to severity of illness  Dispo: The patient is from: Home              Anticipated d/c is to: SNF              Patient currently is not medically stable to d/c.   Difficult to place patient Yes  Consultants:  None  Procedures:  None  Antimicrobials: Anti-infectives (From admission, onward)    Start     Dose/Rate Route Frequency Ordered Stop   10/11/20 1000  vancomycin (VANCOREADY) IVPB 2000 mg/400 mL  Status:  Discontinued        2,000 mg 200 mL/hr over 120 Minutes Intravenous Every 12 hours 10/10/20 2047 10/11/20 1140   10/11/20 0100  ceFEPIme (MAXIPIME) 2 g in sodium chloride 0.9 % 100 mL IVPB  Status:  Discontinued        2 g 200 mL/hr over 30 Minutes Intravenous Every 8 hours 10/10/20 2001 10/11/20 1140   10/10/20 2100  vancomycin (VANCOCIN) 2,500 mg in sodium chloride 0.9 % 500 mL IVPB        2,500 mg 250 mL/hr over 120 Minutes Intravenous  Once 10/10/20 2001 10/11/20 0135   10/10/20 1545  ceFAZolin (ANCEF) IVPB 2g/100 mL premix        2 g 200 mL/hr over 30 Minutes Intravenous  Once 10/10/20 1530 10/10/20 1723        Subjective: Seen and examined at bedside and states she has no nausea or vomiting. States she did not sleep very well at all last night and was very restless and hot. Still complaining of LE pain but has  not taken pain medication as she is awaiting breakfast and does not want to take it on an empty stomach. Also has not had a bowel movement in a few days but thinks it will happen soon. Hoping to get outside this week. No other concerns or complaints at this time.   Objective: Vitals:   12/08/20 0549 12/08/20 1308 12/08/20 2145 12/09/20 0523  BP: 134/84 118/63 110/65 122/66  Pulse: 86 85 96 90  Resp: 16 17 19 19   Temp: 97.7 F (36.5 C) 98.5 F (36.9 C) 98.6 F (37 C) 98.2 F (36.8 C)  TempSrc: Oral Oral Oral Oral  SpO2: 92% 93% 94% 92%  Weight:      Height:        Intake/Output Summary (Last 24 hours) at 12/09/2020 0803 Last data filed at 12/09/2020 0528 Gross per 24 hour  Intake 1560 ml  Output  2100 ml  Net -540 ml    Filed Weights   12/04/20 0506 12/07/20 0527 12/08/20 0500  Weight: (!) 219 kg (!) 217.9 kg (!) 214.5 kg   Examination: Physical Exam:  Constitutional: WN/WD, super morbidly obese AAF in NAD and appears calm but a little uncomfortable Eyes: Lids and conjunctivae normal, sclerae anicteric  ENMT: External Ears, Nose appear normal. Grossly normal hearing.  Neck: Appears normal, supple, no cervical masses, normal ROM, no appreciable thyromegaly; Difficult to assess JVD given body habitus Respiratory: Diminished  to auscultation bilaterally with coarse breath sounds, no wheezing, rales, rhonchi or crackles. Normal respiratory effort and patient is not tachypenic. No accessory muscle use.  Cardiovascular: RRR, no murmurs / rubs / gallops. S1 and S2 auscultated. LE venous stasis changes and lymphedema Abdomen: Soft, non-tender, Distended 2/2 body habitus. Bowel sounds positive.  GU: Deferred. Musculoskeletal: No clubbing / cyanosis of digits/nails. No joint deformity upper and lower extremities.  Skin: Has a posterior knee Left Leg wound.. No induration; Warm and dry.  Neurologic: CN 2-12 grossly intact with no focal deficits. Romberg sign and cerebellar reflexes not  assessed.  Psychiatric: Normal judgment and insight. Alert and oriented x 3. Normal mood and appropriate affect.   Data Reviewed: I have personally reviewed following labs and imaging studies  CBC: Recent Labs  Lab 12/04/20 0310  WBC 9.2  NEUTROABS 6.4  HGB 13.0  HCT 43.1  MCV 96.2  PLT 703    Basic Metabolic Panel: Recent Labs  Lab 12/04/20 0310  NA 136  K 4.0  CL 104  CO2 26  GLUCOSE 172*  BUN 21*  CREATININE 0.74  CALCIUM 8.9  MG 1.6*  PHOS 5.1*    GFR: Estimated Creatinine Clearance: 164.8 mL/min (by C-G formula based on SCr of 0.74 mg/dL). Liver Function Tests: Recent Labs  Lab 12/04/20 0310  AST 20  ALT 18  ALKPHOS 72  BILITOT 0.7  PROT 7.6  ALBUMIN 3.4*    No results for input(s): LIPASE, AMYLASE in the last 168 hours. No results for input(s): AMMONIA in the last 168 hours. Coagulation Profile: No results for input(s): INR, PROTIME in the last 168 hours. Cardiac Enzymes: No results for input(s): CKTOTAL, CKMB, CKMBINDEX, TROPONINI in the last 168 hours. BNP (last 3 results) No results for input(s): PROBNP in the last 8760 hours. HbA1C: No results for input(s): HGBA1C in the last 72 hours. CBG: Recent Labs  Lab 12/08/20 0735 12/08/20 1122 12/08/20 1631 12/08/20 2147 12/09/20 0743  GLUCAP 153* 221* 169* 164* 161*    Lipid Profile: No results for input(s): CHOL, HDL, LDLCALC, TRIG, CHOLHDL, LDLDIRECT in the last 72 hours. Thyroid Function Tests: No results for input(s): TSH, T4TOTAL, FREET4, T3FREE, THYROIDAB in the last 72 hours. Anemia Panel: No results for input(s): VITAMINB12, FOLATE, FERRITIN, TIBC, IRON, RETICCTPCT in the last 72 hours. Sepsis Labs: No results for input(s): PROCALCITON, LATICACIDVEN in the last 168 hours.  No results found for this or any previous visit (from the past 240 hour(s)).   RN Pressure Injury Documentation: Pressure Injury 10/11/20 Thigh Left;Posterior (Active)  10/11/20 0041  Location: Thigh   Location Orientation: Left;Posterior  Staging:   Wound Description (Comments):   Present on Admission:      Pressure Injury 10/11/20 Tibial Left;Posterior;Proximal (Active)  10/11/20 0041  Location: Tibial  Location Orientation: Left;Posterior;Proximal  Staging:   Wound Description (Comments):   Present on Admission:      Estimated body mass index is 67.85 kg/m as calculated from  the following:   Height as of this encounter: 5\' 10"  (1.778 m).   Weight as of this encounter: 214.5 kg.  Malnutrition Type:  Nutrition Problem: Increased nutrient needs Etiology: wound healing   Malnutrition Characteristics:  Signs/Symptoms: estimated needs   Nutrition Interventions:  Interventions: Juven, Other (Comment) (Ensure Max)   Radiology Studies: No results found.  Scheduled Meds:  acetaminophen  1,000 mg Oral QHS   ferrous sulfate  325 mg Oral Daily   hydrocortisone cream   Topical BID   hydrOXYzine  10 mg Oral TID   loratadine  10 mg Oral QPM   magnesium oxide  800 mg Oral BID   metFORMIN  500 mg Oral BID WC   metoprolol tartrate  12.5 mg Oral BID   nutrition supplement (JUVEN)  1 packet Oral BID BM   Ensure Max Protein  11 oz Oral BID   rivaroxaban  10 mg Oral Daily   senna  1 tablet Oral BID   Continuous Infusions:  sodium chloride Stopped (11/15/20 1816)    LOS: 76 days   Kerney Elbe, DO [facility] PAGER is on AMION  If 7PM-7AM, please contact night-coverage [email or web address]

## 2020-12-10 DIAGNOSIS — E875 Hyperkalemia: Secondary | ICD-10-CM | POA: Diagnosis not present

## 2020-12-10 DIAGNOSIS — T148XXA Other injury of unspecified body region, initial encounter: Secondary | ICD-10-CM | POA: Diagnosis not present

## 2020-12-10 DIAGNOSIS — I87399 Chronic venous hypertension (idiopathic) with other complications of unspecified lower extremity: Secondary | ICD-10-CM | POA: Diagnosis not present

## 2020-12-10 DIAGNOSIS — I10 Essential (primary) hypertension: Secondary | ICD-10-CM | POA: Diagnosis not present

## 2020-12-10 LAB — COMPREHENSIVE METABOLIC PANEL
ALT: 27 U/L (ref 0–44)
AST: 22 U/L (ref 15–41)
Albumin: 3.3 g/dL — ABNORMAL LOW (ref 3.5–5.0)
Alkaline Phosphatase: 91 U/L (ref 38–126)
Anion gap: 8 (ref 5–15)
BUN: 30 mg/dL — ABNORMAL HIGH (ref 6–20)
CO2: 27 mmol/L (ref 22–32)
Calcium: 9.4 mg/dL (ref 8.9–10.3)
Chloride: 103 mmol/L (ref 98–111)
Creatinine, Ser: 0.75 mg/dL (ref 0.44–1.00)
GFR, Estimated: >60 mL/min (ref >60)
Glucose, Bld: 153 mg/dL — ABNORMAL HIGH (ref 70–99)
Potassium: 4.8 mmol/L (ref 3.5–5.1)
Sodium: 138 mmol/L (ref 135–145)
Total Bilirubin: 0.8 mg/dL (ref 0.3–1.2)
Total Protein: 7.6 g/dL (ref 6.5–8.1)

## 2020-12-10 LAB — CBC WITH DIFFERENTIAL/PLATELET
Abs Immature Granulocytes: 0.02 10*3/uL (ref 0.00–0.07)
Basophils Absolute: 0 10*3/uL (ref 0.0–0.1)
Basophils Relative: 0 %
Eosinophils Absolute: 0.3 10*3/uL (ref 0.0–0.5)
Eosinophils Relative: 4 %
HCT: 43.9 % (ref 36.0–46.0)
Hemoglobin: 13.2 g/dL (ref 12.0–15.0)
Immature Granulocytes: 0 %
Lymphocytes Relative: 30 %
Lymphs Abs: 2.3 10*3/uL (ref 0.7–4.0)
MCH: 28.4 pg (ref 26.0–34.0)
MCHC: 30.1 g/dL (ref 30.0–36.0)
MCV: 94.6 fL (ref 80.0–100.0)
Monocytes Absolute: 0.5 10*3/uL (ref 0.1–1.0)
Monocytes Relative: 6 %
Neutro Abs: 4.4 10*3/uL (ref 1.7–7.7)
Neutrophils Relative %: 60 %
Platelets: 259 10*3/uL (ref 150–400)
RBC: 4.64 MIL/uL (ref 3.87–5.11)
RDW: 13.4 % (ref 11.5–15.5)
WBC: 7.5 10*3/uL (ref 4.0–10.5)
nRBC: 0 % (ref 0.0–0.2)

## 2020-12-10 LAB — GLUCOSE, CAPILLARY
Glucose-Capillary: 163 mg/dL — ABNORMAL HIGH (ref 70–99)
Glucose-Capillary: 192 mg/dL — ABNORMAL HIGH (ref 70–99)
Glucose-Capillary: 178 mg/dL — ABNORMAL HIGH (ref 70–99)
Glucose-Capillary: 165 mg/dL — ABNORMAL HIGH (ref 70–99)

## 2020-12-10 LAB — PHOSPHORUS: Phosphorus: 5.9 mg/dL — ABNORMAL HIGH (ref 2.5–4.6)

## 2020-12-10 LAB — MAGNESIUM: Magnesium: 1.8 mg/dL (ref 1.7–2.4)

## 2020-12-10 NOTE — Plan of Care (Signed)
  Problem: Nutrition: Goal: Adequate nutrition will be maintained Outcome: Progressing   Problem: Elimination: Goal: Will not experience complications related to urinary retention Outcome: Progressing   Problem: Pain Managment: Goal: General experience of comfort will improve Outcome: Progressing   

## 2020-12-10 NOTE — Progress Notes (Signed)
PROGRESS NOTE    [name]  ZOX:[id number] DOB: December 14, 1965 DOA: 10/10/2020 PCP: Fanny Bien, MD   Brief Narrative:  The patient is a super morbidly obese African-American female with a past medical history significant for but not limited to diabetes mellitus type 2, essential hypertension, chronic lymphedema as well as other comorbidities who was admitted and treated for skin breakdown and concern for a infected wound.  She is currently nonambulatory and lives alone and unable to care for self.  She has completed antibiotics and has been deemed medically stable to be discharged however disposition is difficult given her nonambulatory status and because SNF requires the patient to be under 450 pounds for her bariatric bed and currently she is 472. Last documented weight of 222 is NOT Accurate at all.  Patient's Pain is improved with the addition of PRN Ketorolac but she continues to have some spasms. PT to re-evaluate and goals for this week are to get OOB in the recliner at least 4 out of the 7 days. They will tryu to get her to the recliner tomorrow  Assessment & Plan:   Principal Problem:   Wound infection Active Problems:   Hyperkalemia   Chronic peripheral venous hypertension with lower extremity complication   Lymphedema   Obesity   Type 2 diabetes mellitus (HCC)   HTN (hypertension)   Cellulitis  Chronic bilateral lower extremity lymphedema with Chronic Non-healing Ulcers and concern for infected wound  -Stable; continue lymphedema pumps -Completed Vancomycin  -PT OT to evaluate and treat and once bariatric bed is available at SNF can be discharged; Patient has been discharged from an OT standpoint   Diabetes Mellitus Type 2 -Fairly well controlled with a hemoglobin A1c of 6.5 -Discussed supplement regimen with nutrition registered dietitian as well -Had extensive discussions of glucose control while in the hospital.  Patient did not want any sliding scale insulin  while she was hospitalized so Januvia was started on 11/30/2020 by previous provider.  The patient spent $50 for this prescription as it is nonformulary and wanted to give it a try however now she is agreeable to starting metformin XR; she was hesitant given her history of GI upset several years ago and she is willing to try the ER formulation and this was started last night  -We we will start metformin 500 mg XR twice daily; Now discontinuing Januvia 100 mg for now given that the hospital does not carry it on Formulary.  -Continue to monitor CBGs carefully; these ranging from 139-221 -Diabetes education coordinator consulted for further assistance in recommended starting Amaryl 1 mg p.o. daily however the patient does not want to pursue this option currently  Essential Hypertension -Diuretics has been discontinued  -C/w Metoprolol Tartrate 12.5 mg po BID -Continue to Monitor BP per Protocol -Last BP was 133/90  Hypomagnesemia -Patient's Mag Level was 1.8 on last check  -Continue Repletion with Mag Oxide 800 mg po BID -Continue to Monitor and Replete as Necessary  -Check Labs in the AM   Super Morbid Obesity -Complicates overall prognosis and care -Estimated body mass index is 70.16 kg/m as calculated from the following:   Height as of this encounter: 5\' 10"  (1.778 m).   Weight as of this encounter: 221.8 kg. -Weight Loss and Dietary Counseling given; She is currently not interested in Bariatric Surgrery  -Nutritionist Consulted and recommending Continuing 1 packet of Juven BID and Ensure Max BID as well as weighing the patient daily   Elevated BUN -Mild.  Patient's BUN went from 21 -> 30 -No S/Sx of Melena or GIB and not on Steroids -Continue to Monitor and Trend intermittently  Chronic Pain -C/w Oxycodone 10 mg q4hprn Breakthrough Pain, Diclofenac 2 gram topically 4 times daily PRN, Acetaminophen 1000 mg po qHS and Acetaminophen 650 mg po q6hprn -Added Ketorolac 10 mg po q8hprn  Moderate/Severe Pain   Hx of Depression -Currently no SI/HI -Was offered a Psych Consult  Disposition -She is not a safe discharge disposition as she lives alone and is unable to care for self as she is nonambulatory -No plans for SNF and social worker is assisting with placement -She has to have a target weight goal of 450 pounds prior to discharging to SNF  DVT prophylaxis: Anticoagulated with Rivaroxaban 10 mg po daily  Code Status: FULL CODE  Family Communication: No family present at beds Disposition Plan: SNF when bed available   Status is: Inpatient  Remains inpatient appropriate because:Unsafe d/c plan, IV treatments appropriate due to intensity of illness or inability to take PO, and Inpatient level of care appropriate due to severity of illness  Dispo: The patient is from: Home              Anticipated d/c is to: SNF              Patient currently is not medically stable to d/c.   Difficult to place patient Yes  Consultants:  None  Procedures:  None  Antimicrobials: Anti-infectives (From admission, onward)    Start     Dose/Rate Route Frequency Ordered Stop   10/11/20 1000  vancomycin (VANCOREADY) IVPB 2000 mg/400 mL  Status:  Discontinued        2,000 mg 200 mL/hr over 120 Minutes Intravenous Every 12 hours 10/10/20 2047 10/11/20 1140   10/11/20 0100  ceFEPIme (MAXIPIME) 2 g in sodium chloride 0.9 % 100 mL IVPB  Status:  Discontinued        2 g 200 mL/hr over 30 Minutes Intravenous Every 8 hours 10/10/20 2001 10/11/20 1140   10/10/20 2100  vancomycin (VANCOCIN) 2,500 mg in sodium chloride 0.9 % 500 mL IVPB        2,500 mg 250 mL/hr over 120 Minutes Intravenous  Once 10/10/20 2001 10/11/20 0135   10/10/20 1545  ceFAZolin (ANCEF) IVPB 2g/100 mL premix        2 g 200 mL/hr over 30 Minutes Intravenous  Once 10/10/20 1530 10/10/20 1723        Subjective: Seen and examined at bedside and felt the same. Had a bowel movement yesterday. No nausea or vomiting. No  other concerns or complaints at this time.   Objective: Vitals:   12/09/20 0700 12/09/20 1305 12/09/20 2154 12/10/20 0553  BP:  123/75 131/81 133/90  Pulse:  80 93 88  Resp:  18 20 18   Temp:  98.9 F (37.2 C) 97.9 F (36.6 C) 98.1 F (36.7 C)  TempSrc:  Oral Oral Oral  SpO2:  95% 95% 93%  Weight: (!) 222.3 kg   (!) 221.8 kg  Height:        Intake/Output Summary (Last 24 hours) at 12/10/2020 0849 Last data filed at 12/10/2020 0553 Gross per 24 hour  Intake 780 ml  Output 1475 ml  Net -695 ml    Filed Weights   12/08/20 0500 12/09/20 0700 12/10/20 0553  Weight: (!) 214.5 kg (!) 222.3 kg (!) 221.8 kg   Examination: Physical Exam:  Constitutional: WN/WD super morbidly obese AAF in  NAD appears calm Eyes: Lids and conjunctivae normal, sclerae anicteric  ENMT: External Ears, Nose appear normal. Grossly normal hearing.  Neck: Appears normal, supple, no cervical masses, normal ROM, no appreciable thyromegaly; difficult to assess JVD status  Respiratory: Diminished to auscultation bilaterally, no wheezing, rales, rhonchi or crackles. Normal respiratory effort and patient is not tachypenic. No accessory muscle use. Unlabored breathing. Not wearing Supplemental O2 via [place] Cardiovascular: RRR, no murmurs / rubs / gallops. S1 and S2 auscultated. Has LE Venous stasis changes and lymphedema  Abdomen: Soft, non-tender, Distended 2/2 body habitus. No masses palpated. No appreciable hepatosplenomegaly. Bowel sounds positive.  GU: Deferred. Musculoskeletal: No clubbing / cyanosis of digits/nails. No joint deformity upper and lower extremities. Skin: No rashes, lesions, ulcers but has a posterior knee left leg wound. No induration; Warm and dry.  Neurologic: CN 2-12 grossly intact with no focal deficits. Romberg sign and cerebellar reflexes not assessed.  Psychiatric: Normal judgment and insight. Alert and oriented x 3. Normal mood and appropriate affect.   Data Reviewed: I have personally  reviewed following labs and imaging studies  CBC: Recent Labs  Lab 12/04/20 0310 12/10/20 0339  WBC 9.2 7.5  NEUTROABS 6.4 4.4  HGB 13.0 13.2  HCT 43.1 43.9  MCV 96.2 94.6  PLT 251 627    Basic Metabolic Panel: Recent Labs  Lab 12/04/20 0310 12/10/20 0339  NA 136 138  K 4.0 4.8  CL 104 103  CO2 26 27  GLUCOSE 172* 153*  BUN 21* 30*  CREATININE 0.74 0.75  CALCIUM 8.9 9.4  MG 1.6* 1.8  PHOS 5.1* 5.9*    GFR: Estimated Creatinine Clearance: 168.6 mL/min (by C-G formula based on SCr of 0.75 mg/dL). Liver Function Tests: Recent Labs  Lab 12/04/20 0310 12/10/20 0339  AST 20 22  ALT 18 27  ALKPHOS 72 91  BILITOT 0.7 0.8  PROT 7.6 7.6  ALBUMIN 3.4* 3.3*    No results for input(s): LIPASE, AMYLASE in the last 168 hours. No results for input(s): AMMONIA in the last 168 hours. Coagulation Profile: No results for input(s): INR, PROTIME in the last 168 hours. Cardiac Enzymes: No results for input(s): CKTOTAL, CKMB, CKMBINDEX, TROPONINI in the last 168 hours. BNP (last 3 results) No results for input(s): PROBNP in the last 8760 hours. HbA1C: No results for input(s): HGBA1C in the last 72 hours. CBG: Recent Labs  Lab 12/09/20 0743 12/09/20 1132 12/09/20 1658 12/09/20 2155 12/10/20 0718  GLUCAP 161* 184* 156* 139* 163*    Lipid Profile: No results for input(s): CHOL, HDL, LDLCALC, TRIG, CHOLHDL, LDLDIRECT in the last 72 hours. Thyroid Function Tests: No results for input(s): TSH, T4TOTAL, FREET4, T3FREE, THYROIDAB in the last 72 hours. Anemia Panel: No results for input(s): VITAMINB12, FOLATE, FERRITIN, TIBC, IRON, RETICCTPCT in the last 72 hours. Sepsis Labs: No results for input(s): PROCALCITON, LATICACIDVEN in the last 168 hours.  No results found for this or any previous visit (from the past 240 hour(s)).   RN Pressure Injury Documentation: Pressure Injury 10/11/20 Thigh Left;Posterior (Active)  10/11/20 0041  Location: Thigh  Location  Orientation: Left;Posterior  Staging:   Wound Description (Comments):   Present on Admission:      Pressure Injury 10/11/20 Tibial Left;Posterior;Proximal (Active)  10/11/20 0041  Location: Tibial  Location Orientation: Left;Posterior;Proximal  Staging:   Wound Description (Comments):   Present on Admission:      Estimated body mass index is 70.16 kg/m as calculated from the following:   Height as of  this encounter: 5\' 10"  (1.778 m).   Weight as of this encounter: 221.8 kg.  Malnutrition Type:  Nutrition Problem: Increased nutrient needs Etiology: wound healing   Malnutrition Characteristics:  Signs/Symptoms: estimated needs   Nutrition Interventions:  Interventions: Juven, Other (Comment) (Ensure Max)   Radiology Studies: No results found.  Scheduled Meds:  acetaminophen  1,000 mg Oral QHS   ferrous sulfate  325 mg Oral Daily   hydrocortisone cream   Topical BID   hydrOXYzine  10 mg Oral TID   loratadine  10 mg Oral QPM   magnesium oxide  800 mg Oral BID   metFORMIN  500 mg Oral BID WC   metoprolol tartrate  12.5 mg Oral BID   nutrition supplement (JUVEN)  1 packet Oral BID BM   Ensure Max Protein  11 oz Oral BID   rivaroxaban  10 mg Oral Daily   senna  1 tablet Oral BID   Continuous Infusions:  sodium chloride Stopped (11/15/20 1816)    LOS: 53 days   Kerney Elbe, DO [facility] PAGER is on AMION  If 7PM-7AM, please contact night-coverage [email or web address]

## 2020-12-10 NOTE — Progress Notes (Addendum)
Nutrition Follow-up  DOCUMENTATION CODES:   Morbid obesity  INTERVENTION:  - continue Juven BID and Ensure Max BID. - continue to weigh daily and encourage exercise/movement/OOB. - RD will sign off; please re-consult if additional nutrition-related needs arise.    NUTRITION DIAGNOSIS:   Increased nutrient needs related to wound healing as evidenced by estimated needs. -ongoing  GOAL:   Patient will meet greater than or equal to 90% of their needs -minimally met  MONITOR:   PO intake, Supplement acceptance, Labs, Weight trends, I & O's   ASSESSMENT:   55 year old female with medical history of type 2 DM, morbid obesity, chronic lymphedema, chronic non-healing ulcers on both legs and calves and L upper thigh. She is followed by wound clinic and followed by [facility] 3 times per week until recently d/t insurance company no longer covering this service. She presented to the ED d/t worsening or wounds which have had increased drainage and pain. She is unable to change the dressings herself. At home, she did not walk but would move around the house in a rolling chair 1-2 times/day. She lives alone. Currently pending d/c to SNF but is a difficult placement d/t weight.  Most recent meal intakes: 6/17- 20% of breakfast and 100% of lunch (total of 652 kcal 6/18- 75% of breakfast and 100% of lunch (total of 868 kcal and 30 grams protein 6/19- 0% of breakfast, 100% of lunch, 100% of dinner (total of 1016 kcal and 40 grams protein) 6/20- 50% of breakfast (361 kcal and 10 grams protein)  She has been accepting Ensure Max 100% of the time offered and Juven nearly 100% of the time offered.   Patient is being weighed daily and weight has been fluctuating but is +10 lb from 1 week ago. Non-pitting edema to BLE.   PT worked with patient on 6/18 and 6/20.    Labs reviewed; CBG: 163 mg/dl, BUN: 30 mg/dl, Phos: 5.9 mg/dl.  Medications reviewed; 325 mg ferrous sulfate/day, 800 mg mag-ox  BID, 500 mg glucophage BID, 1 tablet senokot BID.     Diet Order:   Diet Order             Diet heart healthy/carb modified Room service appropriate? Yes; Fluid consistency: Thin  Diet effective now                   EDUCATION NEEDS:   Education needs have been addressed  Skin:  Skin Assessment: Skin Integrity Issues: Skin Integrity Issues:: Other (Comment) Other: L thigh and L tibia pressure injuries (documented 4/22); MASD to coccyx (newly documented 6/7)  Last BM:  6/20 (type 2 x1)  Height:   Ht Readings from Last 1 Encounters:  11/15/20 '5\' 10"'  (1.778 m)    Weight:   Wt Readings from Last 1 Encounters:  12/10/20 (!) 221.8 kg      Estimated Nutritional Needs:  Kcal:  2000-2200 kcal Protein:  120-135 grams Fluid:  >/= 2 L/day       Jarome Matin, MS, RD, LDN, CNSC Inpatient Clinical Dietitian RD pager # available in [place]  After hours/weekend pager # available in [facility]

## 2020-12-10 NOTE — Progress Notes (Signed)
Physical Therapy Treatment Patient Details Name: [name] MRN: [id number] DOB: 1966-04-10 Today's Date: 12/10/2020    History of Present Illness Pt admitted from home with multiple LE wounds, and FTT and unable to perform basic self care.  Pt reports she stayed in lift chair getting up only twice a day for BM or getting food.  Pt utilizing Rollator around home but pivoted to it from lift chair and wheeled around in seated position.  Pt states she put pads and diapers in chair so she did not have to get up to urinate.  Pt with hx of DM and lymphadema (has been unable to use her pressure sleeves so lymphadema has significantly worsened since last December).    PT Comments    Patient receiving dressing changes prior to therapy. Patient is able to reach for each rail in preparation for rolling but requires 2 total to initiate rolling lower body using bed pads. Patient is unable to mov the legs to assist rolling. Once rolled, patient's feet are crossed. Once rolled onto back, patient is unable to uncross the feet and has to be assisted by staff. Patient's goals are minimal due to patient report of leg pain with movbility and body habitus.   Follow Up Recommendations  SNF     Equipment Recommendations       Recommendations for Other Services       Precautions / Restrictions Precautions Precautions: Fall Precaution Comments: posterior and medial LLE wounds, place bed pad  to cushion when maxiskying.    Mobility  Bed Mobility   Bed Mobility: Rolling Rolling: Max assist;+2 for physical assistance         General bed mobility comments: patient  able to reach for each rail but unable to move legs  to facilitate rolling. requires bed pad and 2  total to iniate rolling. slowly moves back  and rolls to left to get pads in  place.    Transfers                    Ambulation/Gait                 Stairs             Wheelchair Mobility    Modified  Rankin (Stroke Patients Only)       Balance                                            Cognition Arousal/Alertness: Awake/alert Behavior During Therapy: WFL for tasks assessed/performed;Anxious                                          Exercises      General Comments        Pertinent Vitals/Pain Faces Pain Scale: Hurts whole lot Pain Location: initially spasms in posterior right leg,  right hip when rolling to each side Pain Descriptors / Indicators: Grimacing;Moaning Pain Intervention(s): Monitored during session;Premedicated before session    Home Living                      Prior Function            PT Goals (current goals can now be found in the care plan section)  Acute Rehab PT Goals Time For Goal Achievement: 12/24/20 Progress towards PT goals:  (limited by body habitus)    Frequency    Min 2X/week      PT Plan Current plan remains appropriate    Co-evaluation              AM-PAC PT "6 Clicks" Mobility   Outcome Measure  Help needed turning from your back to your side while in a flat bed without using bedrails?: Total Help needed moving from lying on your back to sitting on the side of a flat bed without using bedrails?: Total Help needed moving to and from a bed to a chair (including a wheelchair)?: Total Help needed standing up from a chair using your arms (e.g., wheelchair or bedside chair)?: Total   Help needed climbing 3-5 steps with a railing? : Total 6 Click Score: 5    End of Session Equipment Utilized During Treatment:  (maxisky) Activity Tolerance: Patient tolerated treatment well Patient left: in chair;with call bell/phone within reach Nurse Communication: Mobility status;Need for lift equipment PT Visit Diagnosis: Muscle weakness (generalized) (M62.81);Difficulty in walking, not elsewhere classified (R26.2);Pain;Adult, failure to thrive (R62.7) Pain - Right/Left: Right Pain - part of  body: Knee;Leg;Hip     Time: 4656-8127 PT Time Calculation (min) (ACUTE ONLY): 26 min  Charges:  $Therapeutic Activity: 23-37 mins                     [name] PT Acute Rehabilitation Services Pager [phone] Office [phone]    [name] 12/10/2020, 2:18 PM

## 2020-12-10 NOTE — Plan of Care (Signed)
  Problem: Activity: Goal: Risk for activity intolerance will decrease Outcome: Progressing   Problem: Pain Managment: Goal: General experience of comfort will improve Outcome: Progressing   

## 2020-12-10 NOTE — Progress Notes (Signed)
I have reviewed and concur with students documentation.  

## 2020-12-11 LAB — GLUCOSE, CAPILLARY
Glucose-Capillary: 145 mg/dL — ABNORMAL HIGH (ref 70–99)
Glucose-Capillary: 157 mg/dL — ABNORMAL HIGH (ref 70–99)
Glucose-Capillary: 174 mg/dL — ABNORMAL HIGH (ref 70–99)
Glucose-Capillary: 145 mg/dL — ABNORMAL HIGH (ref 70–99)

## 2020-12-11 NOTE — Plan of Care (Signed)
?  Problem: Activity: ?Goal: Risk for activity intolerance will decrease ?Outcome: Progressing ?  ?Problem: Pain Managment: ?Goal: General experience of comfort will improve ?Outcome: Progressing ?  ?Problem: Nutrition: ?Goal: Adequate nutrition will be maintained ?Outcome: Progressing ?  ?

## 2020-12-11 NOTE — Plan of Care (Signed)
  Problem: Nutrition: Goal: Adequate nutrition will be maintained Outcome: Progressing   Problem: Coping: Goal: Level of anxiety will decrease Outcome: Progressing   Problem: Pain Managment: Goal: General experience of comfort will improve Outcome: Progressing   Problem: Safety: Goal: Ability to remain free from injury will improve Outcome: Progressing   

## 2020-12-11 NOTE — TOC Progression Note (Signed)
Transition of Care [facility]) - Progression Note    Patient Details  Name: [name] MRN: [id number] Date of Birth: 04-24-1966  [facility] CM/SW Contact  Lennart Pall, LCSW Phone Number: 12/11/2020, 1:24 PM  Clinical Narrative:     Have received a SNF bed offer from [facility] who hopes to be able to admit pt this week.  Have spoken with pt who is agreeable and have begun insurance authorization (ref# [id number]). MD/RN aware.  Expected Discharge Plan: [place] Barriers to Discharge: Continued Medical Work up  Expected Discharge Plan and Services Expected Discharge Plan: [place] In-house Referral: Clinical Social Work Discharge Planning Services: CM Consult Post Acute Care Choice: [place] Living arrangements for the past 2 months: Apartment                                       Social Determinants of Health (SDOH) Interventions    Readmission Risk Interventions No flowsheet data found.

## 2020-12-11 NOTE — Progress Notes (Signed)
PROGRESS NOTE    KOLLEEN [facility]  WEX:[id number] DOB: [birth year] DOA: 10/10/2020 PCP: Fanny Bien, MD    Chief Complaint  Patient presents with   Wound Infection    Brief Narrative:  diabetes mellitus type 2, essential hypertension, chronic lymphedema as well as other comorbidities who was admitted and treated for skin breakdown and concern for a infected wound.  She is currently nonambulatory and lives alone and unable to care for self.  She has completed antibiotics and has been deemed medically stable to be discharged however disposition is difficult given her nonambulatory status and because SNF requires the patient to be under 450 pounds for her bariatric bed  Subjective:  Non interval changes, she states Robaxin and oxycodone help with muscle spasm and pain Awaiting for skilled nursing facility placement  Assessment & Plan:   Principal Problem:   Wound infection Active Problems:   Hyperkalemia   Chronic peripheral venous hypertension with lower extremity complication   Lymphedema   Obesity   Type 2 diabetes mellitus (HCC)   HTN (hypertension)   Cellulitis  Chronic bilateral lower extremity wounds/ lymphedema: -h/o chronic unhealing ulcers, she has chronic leg wound treated by wound care Dr. Quentin Cornwall since 2018 -On presentation patient noted to have increased pain, foul-smelling discharge from wounds -Status post 1 day of IV vancomycin and IV cefepime (10/10/2020>>> 10/11/2020) -Wound pink and clean, no odor  Chronic Pain -C/w Oxycodone 10 mg q4hprn Breakthrough Pain, Diclofenac 2 gram topically 4 times daily PRN, Acetaminophen 1000 mg po qHS and Acetaminophen 650 mg po q6hprn -Added Ketorolac 10 mg po q8hprn Moderate/Severe Pain  Noninsulin-dependent type 2 diabetes A1c 6.5 She is currently on metformin and SSI A.m. fasting blood glucose 153  Hypertension Currently on metoprolol    FTT Prior to this hospitalization she lives by herself at home  using lift chair to stand up for possible 4 to 10-second hold.  To her extrawide rolling walker with a reward seat sitting in a chair and then would propel her self to the kitchen She has been sleeping in her lift chair since December She lives by her self prior to admission Awaiting for snf    Morbid obesity: Body mass index is 69.56 kg/m.Marland Kitchen Seen by dietician.  I agree with the assessment and plan as outlined below: Nutrition Status: Nutrition Problem: Increased nutrient needs Etiology: wound healing Signs/Symptoms: estimated needs Interventions: Juven, Other (Comment) (Ensure Max)  .     Skin Assessment: I have examined the patient's skin and I agree with the wound assessment as performed by the wound care RN as outlined below:  Pressure Injury 10/11/20 Thigh Left;Posterior (Active)  10/11/20 0041  Location: Thigh  Location Orientation: Left;Posterior  Staging:   Wound Description (Comments):   Present on Admission:      Pressure Injury 10/11/20 Tibial Left;Posterior;Proximal (Active)  10/11/20 0041  Location: Tibial  Location Orientation: Left;Posterior;Proximal  Staging:   Wound Description (Comments):   Present on Admission:          DVT prophylaxis: rivaroxaban (XARELTO) tablet 10 mg Start: 11/27/20 1200 rivaroxaban (XARELTO) tablet 10 mg   Code Status: Full Family Communication: patient  Disposition:   Status is: Inpatient  Dispo: The patient is from: Home              Anticipated d/c is to: SNF              Anticipated d/c date is: Medically ready to discharge, awaiting for SNF bed  Consultants:  Ortho Dr. Lorin Mercy on 6 1 Wound care  Procedures:  None  Antimicrobials:    Anti-infectives (From admission, onward)    Start     Dose/Rate Route Frequency Ordered Stop   10/11/20 1000  vancomycin (VANCOREADY) IVPB 2000 mg/400 mL  Status:  Discontinued        2,000 mg 200 mL/hr over 120 Minutes Intravenous Every 12 hours 10/10/20 2047  10/11/20 1140   10/11/20 0100  ceFEPIme (MAXIPIME) 2 g in sodium chloride 0.9 % 100 mL IVPB  Status:  Discontinued        2 g 200 mL/hr over 30 Minutes Intravenous Every 8 hours 10/10/20 2001 10/11/20 1140   10/10/20 2100  vancomycin (VANCOCIN) 2,500 mg in sodium chloride 0.9 % 500 mL IVPB        2,500 mg 250 mL/hr over 120 Minutes Intravenous  Once 10/10/20 2001 10/11/20 0135   10/10/20 1545  ceFAZolin (ANCEF) IVPB 2g/100 mL premix        2 g 200 mL/hr over 30 Minutes Intravenous  Once 10/10/20 1530 10/10/20 1723          Objective: Vitals:   12/10/20 1346 12/10/20 2140 12/11/20 0500 12/11/20 0521  BP: (!) 140/59 110/66  130/90  Pulse: 84 94  95  Resp:  18  20  Temp:  98.5 F (36.9 C)  97.9 F (36.6 C)  TempSrc:  Oral  Oral  SpO2: 96% 94%  93%  Weight:   (!) 219.9 kg   Height:        Intake/Output Summary (Last 24 hours) at 12/11/2020 1130 Last data filed at 12/11/2020 0600 Gross per 24 hour  Intake 720 ml  Output 700 ml  Net 20 ml   Filed Weights   12/09/20 0700 12/10/20 0553 12/11/20 0500  Weight: (!) 222.3 kg (!) 221.8 kg (!) 219.9 kg    Examination:  General exam: calm, NAD Respiratory system: Clear to auscultation. Respiratory effort normal. Cardiovascular system: S1 & S2 heard, RRR.  Gastrointestinal system: Abdomen is nondistended, soft and nontender. Normal bowel sounds heard. Central nervous system: Alert and oriented. No focal neurological deficits. Extremities: Generalized weakness, chronic lymphedema Skin: Left lower extremity wound, dressing intact Psychiatry: Judgement and insight appear normal. Mood & affect appropriate.     Data Reviewed: I have personally reviewed following labs and imaging studies  CBC: Recent Labs  Lab 12/10/20 0339  WBC 7.5  NEUTROABS 4.4  HGB 13.2  HCT 43.9  MCV 94.6  PLT 161    Basic Metabolic Panel: Recent Labs  Lab 12/10/20 0339  NA 138  K 4.8  CL 103  CO2 27  GLUCOSE 153*  BUN 30*  CREATININE  0.75  CALCIUM 9.4  MG 1.8  PHOS 5.9*    GFR: Estimated Creatinine Clearance: 167.7 mL/min (by C-G formula based on SCr of 0.75 mg/dL).  Liver Function Tests: Recent Labs  Lab 12/10/20 0339  AST 22  ALT 27  ALKPHOS 91  BILITOT 0.8  PROT 7.6  ALBUMIN 3.3*    CBG: Recent Labs  Lab 12/10/20 0718 12/10/20 1106 12/10/20 1608 12/10/20 2139 12/11/20 0759  GLUCAP 163* 192* 178* 165* 145*     No results found for this or any previous visit (from the past 240 hour(s)).       Radiology Studies: No results found.      Scheduled Meds:  acetaminophen  1,000 mg Oral QHS   ferrous sulfate  325 mg Oral Daily   hydrocortisone  cream   Topical BID   hydrOXYzine  10 mg Oral TID   loratadine  10 mg Oral QPM   magnesium oxide  800 mg Oral BID   metFORMIN  500 mg Oral BID WC   metoprolol tartrate  12.5 mg Oral BID   nutrition supplement (JUVEN)  1 packet Oral BID BM   Ensure Max Protein  11 oz Oral BID   rivaroxaban  10 mg Oral Daily   senna  1 tablet Oral BID   Continuous Infusions:  sodium chloride Stopped (11/15/20 1816)     LOS: 55 days   Time spent: 42mins Greater than 50% of this time was spent in counseling, explanation of diagnosis, planning of further management, and coordination of care.   Voice Recognition Viviann Spare dictation system was used to create this note, attempts have been made to correct errors. Please contact the author with questions and/or clarifications.   Florencia Reasons, MD PhD FACP [facility]  Available via Epic secure chat 7am-7pm for nonurgent issues Please page for urgent issues To page the attending provider between 7A-7P or the covering provider during after hours 7P-7A, please log into the web site [email or web address] and access using universal [place] password for that web site. If you do not have the password, please call the hospital operator.    12/11/2020, 11:30 AM

## 2020-12-12 LAB — GLUCOSE, CAPILLARY
Glucose-Capillary: 157 mg/dL — ABNORMAL HIGH (ref 70–99)
Glucose-Capillary: 157 mg/dL — ABNORMAL HIGH (ref 70–99)
Glucose-Capillary: 163 mg/dL — ABNORMAL HIGH (ref 70–99)
Glucose-Capillary: 155 mg/dL — ABNORMAL HIGH (ref 70–99)

## 2020-12-12 NOTE — Plan of Care (Signed)
  Problem: Coping: Goal: Level of anxiety will decrease Outcome: Progressing   Problem: Elimination: Goal: Will not experience complications related to bowel motility Outcome: Progressing Goal: Will not experience complications related to urinary retention Outcome: Progressing   Problem: Safety: Goal: Ability to remain free from injury will improve Outcome: Progressing   

## 2020-12-12 NOTE — Plan of Care (Signed)
  Problem: Activity: Goal: Risk for activity intolerance will decrease Outcome: Progressing   Problem: Pain Managment: Goal: General experience of comfort will improve Outcome: Progressing   Problem: Safety: Goal: Ability to remain free from injury will improve Outcome: Progressing   

## 2020-12-12 NOTE — TOC Progression Note (Signed)
Transition of Care [facility]) - Progression Note    Patient Details  Name: HERMINA BARNARD MRN: [id number] Date of Birth: 06/05/1966  Transition of Care [facility]) CM/SW Contact  Lennart Pall, LCSW Phone Number: 12/12/2020, 3:04 PM  Clinical Narrative:    Have received insurance authorization for SNF approved 6/23-6/27 with ref # [id number].  Have spoken with Lavella Lemons - Admissions at [facility] this afternoon and, it appears, they are now having some concerns/ questions about admitting pt.  She plans to meet pt here at the hospital (possibly today?) and wants to discuss further with her team.  Hopeful that we will be cleared for admission by tomorrow but will keep everyone posted.   Have updated patient on all of this and she is awaiting word on any confirmation of plans.     Expected Discharge Plan: Skilled Nursing Facility Barriers to Discharge: Ship broker  Expected Discharge Plan and Services Expected Discharge Plan: [place] In-house Referral: Clinical Social Work Discharge Planning Services: CM Consult Post Acute Care Choice: [place] Living arrangements for the past 2 months: Apartment                                       Social Determinants of Health (SDOH) Interventions    Readmission Risk Interventions No flowsheet data found.

## 2020-12-12 NOTE — Progress Notes (Signed)
PROGRESS NOTE    VERITY GILCREST  QBH:[id number] DOB: [birth year] DOA: 10/10/2020 PCP: Fanny Bien, MD    Chief Complaint  Patient presents with   Wound Infection    Brief Narrative:  diabetes mellitus type 2, essential hypertension, chronic lymphedema as well as other comorbidities who was admitted and treated for skin breakdown and concern for a infected wound.  She is currently nonambulatory and lives alone and unable to care for self.  She has completed antibiotics and has been deemed medically stable to be discharged however disposition is difficult given her nonambulatory status and because SNF requires the patient to be under 450 pounds for her bariatric bed  Subjective:  Non interval changes, she states Robaxin and oxycodone help with muscle spasm and pain She needs lift to get out of bed, Awaiting for skilled nursing facility placement  Assessment & Plan:   Principal Problem:   Wound infection Active Problems:   Hyperkalemia   Chronic peripheral venous hypertension with lower extremity complication   Lymphedema   Obesity   Type 2 diabetes mellitus (HCC)   HTN (hypertension)   Cellulitis  Chronic bilateral lower extremity wounds/ lymphedema: -h/o chronic unhealing ulcers, she has chronic leg wound treated by wound care Dr. Quentin Cornwall since 2018 -On presentation patient noted to have increased pain, foul-smelling discharge from wounds -Status post 1 day of IV vancomycin and IV cefepime (10/10/2020>>> 10/11/2020) -Wound pink and clean, no odor  Chronic Pain -C/w Oxycodone 10 mg q4hprn Breakthrough Pain, Diclofenac 2 gram topically 4 times daily PRN, Acetaminophen 1000 mg po qHS and Acetaminophen 650 mg po q6hprn -Added Ketorolac 10 mg po q8hprn Moderate/Severe Pain  Noninsulin-dependent type 2 diabetes A1c 6.5 She is currently on metformin and SSI A.m. fasting blood glucose 153  Hypertension Currently on metoprolol    FTT Prior to this  hospitalization she lives by herself at home using lift chair to stand up for possible 4 to 10-second hold.  To her extrawide rolling walker with a reward seat sitting in a chair and then would propel her self to the kitchen She has been sleeping in her lift chair since December She lives by her self prior to admission Awaiting for snf    Morbid obesity: Body mass index is 69.34 kg/m.Marland Kitchen Seen by dietician.  I agree with the assessment and plan as outlined below: Nutrition Status: Nutrition Problem: Increased nutrient needs Etiology: wound healing Signs/Symptoms: estimated needs Interventions: Juven, Other (Comment) (Ensure Max)  .     Skin Assessment: I have examined the patient's skin and I agree with the wound assessment as performed by the wound care RN as outlined below:  Pressure Injury 10/11/20 Thigh Left;Posterior (Active)  10/11/20 0041  Location: Thigh  Location Orientation: Left;Posterior  Staging:   Wound Description (Comments):   Present on Admission:      Pressure Injury 10/11/20 Tibial Left;Posterior;Proximal (Active)  10/11/20 0041  Location: Tibial  Location Orientation: Left;Posterior;Proximal  Staging:   Wound Description (Comments):   Present on Admission:          DVT prophylaxis: rivaroxaban (XARELTO) tablet 10 mg Start: 11/27/20 1200 rivaroxaban (XARELTO) tablet 10 mg   Code Status: Full Family Communication: patient  Disposition:   Status is: Inpatient  Dispo: The patient is from: Home              Anticipated d/c is to: SNF              Anticipated d/c date is: Medically ready  to discharge, awaiting for SNF bed               Consultants:  Ortho Dr. Lorin Mercy on 6 1 Wound care  Procedures:  None  Antimicrobials:    Anti-infectives (From admission, onward)    Start     Dose/Rate Route Frequency Ordered Stop   10/11/20 1000  vancomycin (VANCOREADY) IVPB 2000 mg/400 mL  Status:  Discontinued        2,000 mg 200 mL/hr over 120  Minutes Intravenous Every 12 hours 10/10/20 2047 10/11/20 1140   10/11/20 0100  ceFEPIme (MAXIPIME) 2 g in sodium chloride 0.9 % 100 mL IVPB  Status:  Discontinued        2 g 200 mL/hr over 30 Minutes Intravenous Every 8 hours 10/10/20 2001 10/11/20 1140   10/10/20 2100  vancomycin (VANCOCIN) 2,500 mg in sodium chloride 0.9 % 500 mL IVPB        2,500 mg 250 mL/hr over 120 Minutes Intravenous  Once 10/10/20 2001 10/11/20 0135   10/10/20 1545  ceFAZolin (ANCEF) IVPB 2g/100 mL premix        2 g 200 mL/hr over 30 Minutes Intravenous  Once 10/10/20 1530 10/10/20 1723          Objective: Vitals:   12/11/20 0521 12/11/20 1340 12/11/20 2147 12/12/20 0624  BP: 130/90 (!) 136/94 127/85 134/87  Pulse: 95 92 99 98  Resp: 20  17 17   Temp: 97.9 F (36.6 C) 98.1 F (36.7 C) 98 F (36.7 C) 98.9 F (37.2 C)  TempSrc: Oral Oral Oral Oral  SpO2: 93% 91% 91% 91%  Weight:    (!) 219.2 kg  Height:        Intake/Output Summary (Last 24 hours) at 12/12/2020 1600 Last data filed at 12/12/2020 5681 Gross per 24 hour  Intake 960 ml  Output 800 ml  Net 160 ml   Filed Weights   12/10/20 0553 12/11/20 0500 12/12/20 0624  Weight: (!) 221.8 kg (!) 219.9 kg (!) 219.2 kg    Examination:  General exam: calm, NAD Respiratory system: Clear to auscultation. Respiratory effort normal. Cardiovascular system: S1 & S2 heard, RRR.  Gastrointestinal system: Abdomen is nondistended, soft and nontender. Normal bowel sounds heard. Central nervous system: Alert and oriented. No focal neurological deficits. Extremities: Generalized weakness, chronic lymphedema Skin: Left lower extremity wound, dressing intact, no odor, no active drainage  Psychiatry: Judgement and insight appear normal. Mood & affect appropriate.     Data Reviewed: I have personally reviewed following labs and imaging studies  CBC: Recent Labs  Lab 12/10/20 0339  WBC 7.5  NEUTROABS 4.4  HGB 13.2  HCT 43.9  MCV 94.6  PLT 259     Basic Metabolic Panel: Recent Labs  Lab 12/10/20 0339  NA 138  K 4.8  CL 103  CO2 27  GLUCOSE 153*  BUN 30*  CREATININE 0.75  CALCIUM 9.4  MG 1.8  PHOS 5.9*    GFR: Estimated Creatinine Clearance: 167.3 mL/min (by C-G formula based on SCr of 0.75 mg/dL).  Liver Function Tests: Recent Labs  Lab 12/10/20 0339  AST 22  ALT 27  ALKPHOS 91  BILITOT 0.8  PROT 7.6  ALBUMIN 3.3*    CBG: Recent Labs  Lab 12/11/20 1138 12/11/20 1646 12/11/20 2150 12/12/20 0748 12/12/20 1211  GLUCAP 157* 174* 145* 157* 157*     No results found for this or any previous visit (from the past 240 hour(s)).  Radiology Studies: No results found.      Scheduled Meds:  acetaminophen  1,000 mg Oral QHS   ferrous sulfate  325 mg Oral Daily   hydrocortisone cream   Topical BID   hydrOXYzine  10 mg Oral TID   loratadine  10 mg Oral QPM   magnesium oxide  800 mg Oral BID   metFORMIN  500 mg Oral BID WC   metoprolol tartrate  12.5 mg Oral BID   nutrition supplement (JUVEN)  1 packet Oral BID BM   Ensure Max Protein  11 oz Oral BID   rivaroxaban  10 mg Oral Daily   senna  1 tablet Oral BID   Continuous Infusions:  sodium chloride Stopped (11/15/20 1816)     LOS: 56 days   Time spent: 53mins Greater than 50% of this time was spent in counseling, explanation of diagnosis, planning of further management, and coordination of care.   Voice Recognition Viviann Spare dictation system was used to create this note, attempts have been made to correct errors. Please contact the author with questions and/or clarifications.   Florencia Reasons, MD PhD FACP [facility]  Available via Epic secure chat 7am-7pm for nonurgent issues Please page for urgent issues To page the attending provider between 7A-7P or the covering provider during after hours 7P-7A, please log into the web site [email or web address] and access using universal [place] password for that web site. If you do not have the  password, please call the hospital operator.    12/12/2020, 4:00 PM

## 2020-12-13 LAB — GLUCOSE, CAPILLARY
Glucose-Capillary: 153 mg/dL — ABNORMAL HIGH (ref 70–99)
Glucose-Capillary: 166 mg/dL — ABNORMAL HIGH (ref 70–99)
Glucose-Capillary: 139 mg/dL — ABNORMAL HIGH (ref 70–99)
Glucose-Capillary: 151 mg/dL — ABNORMAL HIGH (ref 70–99)

## 2020-12-13 NOTE — TOC Progression Note (Signed)
Transition of Care [facility]) - Progression Note   Patient Details  Name: [name] MRN: [id number] Date of Birth: 1965/10/21  Transition of Care [facility]) CM/SW [place], LCSW Phone Number: 12/13/2020, 11:34 AM  Clinical Narrative: CSW updated by [facility] supervisor that [facility] will not take the patient until she is 470 lbs or she can pivot to transfer, whichever comes first. CSW updated charge RN. TOC to follow.  Expected Discharge Plan: Skilled Nursing Facility Barriers to Discharge: Ship broker  Expected Discharge Plan and Services Expected Discharge Plan: [place] In-house Referral: Clinical Social Work Discharge Planning Services: CM Consult Post Acute Care Choice: [place] Living arrangements for the past 2 months: Apartment  Readmission Risk Interventions No flowsheet data found.

## 2020-12-13 NOTE — Progress Notes (Signed)
PROGRESS NOTE    [name]  QQI:[id number] DOB: [birth year] DOA: 10/10/2020 PCP: Fanny Bien, MD    Chief Complaint  Patient presents with   Wound Infection    Brief Narrative:  diabetes mellitus type 2, essential hypertension, chronic lymphedema as well as other comorbidities who was admitted and treated for skin breakdown and concern for a infected wound.  She is currently nonambulatory and lives alone and unable to care for self.  She has completed antibiotics and has been deemed medically stable to be discharged however disposition is difficult given her nonambulatory status and because SNF requires the patient to be under 450 pounds for her bariatric bed  Subjective:  Non interval changes,  She needs lift to get out of bed, Medically stable to discharge to snf,  Awaiting for skilled nursing facility placement  Assessment & Plan:   Principal Problem:   Wound infection Active Problems:   Hyperkalemia   Chronic peripheral venous hypertension with lower extremity complication   Lymphedema   Obesity   Type 2 diabetes mellitus (HCC)   HTN (hypertension)   Cellulitis  Chronic bilateral lower extremity wounds/ lymphedema: -h/o chronic unhealing ulcers, she has chronic leg wound treated by wound care Dr. Quentin Cornwall since 2018 -On presentation patient noted to have increased pain, foul-smelling discharge from wounds -Status post 1 day of IV vancomycin and IV cefepime (10/10/2020>>> 10/11/2020) -Wound pink and clean, no odor  Chronic Pain -C/w Oxycodone 10 mg q4hprn Breakthrough Pain, Diclofenac 2 gram topically 4 times daily PRN, Acetaminophen 1000 mg po qHS and Acetaminophen 650 mg po q6hprn -Added Ketorolac 10 mg po q8hprn Moderate/Severe Pain  Noninsulin-dependent type 2 diabetes A1c 6.5 She is currently on metformin and SSI A.m. fasting blood glucose 153  Hypertension Currently on metoprolol    FTT Prior to this hospitalization she lives by herself  at home using lift chair to stand up for possible 4 to 10-second hold.  To her extrawide rolling walker with a reward seat sitting in a chair and then would propel her self to the kitchen She has been sleeping in her lift chair since December She lives by her self prior to admission Awaiting for snf    Morbid obesity: Body mass index is 67.54 kg/m.Marland Kitchen Seen by dietician.  I agree with the assessment and plan as outlined below: Nutrition Status: Nutrition Problem: Increased nutrient needs Etiology: wound healing Signs/Symptoms: estimated needs Interventions: Juven, Other (Comment) (Ensure Max)  .     Skin Assessment: I have examined the patient's skin and I agree with the wound assessment as performed by the wound care RN as outlined below:  Pressure Injury 10/11/20 Thigh Left;Posterior (Active)  10/11/20 0041  Location: Thigh  Location Orientation: Left;Posterior  Staging:   Wound Description (Comments):   Present on Admission:      Pressure Injury 10/11/20 Tibial Left;Posterior;Proximal (Active)  10/11/20 0041  Location: Tibial  Location Orientation: Left;Posterior;Proximal  Staging:   Wound Description (Comments):   Present on Admission:          DVT prophylaxis: rivaroxaban (XARELTO) tablet 10 mg Start: 11/27/20 1200 rivaroxaban (XARELTO) tablet 10 mg   Code Status: Full Family Communication: patient  Disposition:   Status is: Inpatient  Dispo: The patient is from: Home              Anticipated d/c is to: SNF              Anticipated d/c date is: Medically ready to discharge, awaiting  for SNF bed               Consultants:  Ortho Dr. Lorin Mercy on 6 1 Wound care  Procedures:  None  Antimicrobials:    Anti-infectives (From admission, onward)    Start     Dose/Rate Route Frequency Ordered Stop   10/11/20 1000  vancomycin (VANCOREADY) IVPB 2000 mg/400 mL  Status:  Discontinued        2,000 mg 200 mL/hr over 120 Minutes Intravenous Every 12 hours  10/10/20 2047 10/11/20 1140   10/11/20 0100  ceFEPIme (MAXIPIME) 2 g in sodium chloride 0.9 % 100 mL IVPB  Status:  Discontinued        2 g 200 mL/hr over 30 Minutes Intravenous Every 8 hours 10/10/20 2001 10/11/20 1140   10/10/20 2100  vancomycin (VANCOCIN) 2,500 mg in sodium chloride 0.9 % 500 mL IVPB        2,500 mg 250 mL/hr over 120 Minutes Intravenous  Once 10/10/20 2001 10/11/20 0135   10/10/20 1545  ceFAZolin (ANCEF) IVPB 2g/100 mL premix        2 g 200 mL/hr over 30 Minutes Intravenous  Once 10/10/20 1530 10/10/20 1723          Objective: Vitals:   12/13/20 0500 12/13/20 0515 12/13/20 1300 12/13/20 1427  BP:  134/77  110/74  Pulse:  93  87  Resp:  18  18  Temp:  98 F (36.7 C)  98.3 F (36.8 C)  TempSrc:  Oral    SpO2:  (!) 88%  96%  Weight: (!) 216.8 kg  (!) 213.5 kg   Height:        Intake/Output Summary (Last 24 hours) at 12/13/2020 1700 Last data filed at 12/13/2020 0900 Gross per 24 hour  Intake 960 ml  Output 1750 ml  Net -790 ml   Filed Weights   12/12/20 0624 12/13/20 0500 12/13/20 1300  Weight: (!) 219.2 kg (!) 216.8 kg (!) 213.5 kg    Examination:  General exam: calm, NAD Respiratory system: Clear to auscultation. Respiratory effort normal. Cardiovascular system: S1 & S2 heard, RRR.  Gastrointestinal system: Abdomen is nondistended, soft and nontender. Normal bowel sounds heard. Central nervous system: Alert and oriented. No focal neurological deficits. Extremities: Generalized weakness, chronic lymphedema Skin: Left lower extremity wound, dressing intact, no odor, no active drainage  Psychiatry: Judgement and insight appear normal. Mood & affect appropriate.     Data Reviewed: I have personally reviewed following labs and imaging studies  CBC: Recent Labs  Lab 12/10/20 0339  WBC 7.5  NEUTROABS 4.4  HGB 13.2  HCT 43.9  MCV 94.6  PLT 354    Basic Metabolic Panel: Recent Labs  Lab 12/10/20 0339  NA 138  K 4.8  CL 103  CO2 27   GLUCOSE 153*  BUN 30*  CREATININE 0.75  CALCIUM 9.4  MG 1.8  PHOS 5.9*    GFR: Estimated Creatinine Clearance: 164.3 mL/min (by C-G formula based on SCr of 0.75 mg/dL).  Liver Function Tests: Recent Labs  Lab 12/10/20 0339  AST 22  ALT 27  ALKPHOS 91  BILITOT 0.8  PROT 7.6  ALBUMIN 3.3*    CBG: Recent Labs  Lab 12/12/20 1633 12/12/20 2223 12/13/20 0731 12/13/20 1156 12/13/20 1643  GLUCAP 163* 155* 153* 166* 139*     No results found for this or any previous visit (from the past 240 hour(s)).       Radiology Studies: No results found.  Scheduled Meds:  acetaminophen  1,000 mg Oral QHS   ferrous sulfate  325 mg Oral Daily   hydrocortisone cream   Topical BID   hydrOXYzine  10 mg Oral TID   loratadine  10 mg Oral QPM   magnesium oxide  800 mg Oral BID   metFORMIN  500 mg Oral BID WC   metoprolol tartrate  12.5 mg Oral BID   nutrition supplement (JUVEN)  1 packet Oral BID BM   Ensure Max Protein  11 oz Oral BID   rivaroxaban  10 mg Oral Daily   senna  1 tablet Oral BID   Continuous Infusions:  sodium chloride Stopped (11/15/20 1816)     LOS: 57 days   Time spent: 47mins Greater than 50% of this time was spent in counseling, explanation of diagnosis, planning of further management, and coordination of care.   Voice Recognition Viviann Spare dictation system was used to create this note, attempts have been made to correct errors. Please contact the author with questions and/or clarifications.   Florencia Reasons, MD PhD FACP [facility]  Available via Epic secure chat 7am-7pm for nonurgent issues Please page for urgent issues To page the attending provider between 7A-7P or the covering provider during after hours 7P-7A, please log into the web site [email or web address] and access using universal [place] password for that web site. If you do not have the password, please call the hospital operator.    12/13/2020, 5:00 PM

## 2020-12-14 LAB — GLUCOSE, CAPILLARY
Glucose-Capillary: 159 mg/dL — ABNORMAL HIGH (ref 70–99)
Glucose-Capillary: 179 mg/dL — ABNORMAL HIGH (ref 70–99)
Glucose-Capillary: 174 mg/dL — ABNORMAL HIGH (ref 70–99)
Glucose-Capillary: 159 mg/dL — ABNORMAL HIGH (ref 70–99)

## 2020-12-14 LAB — BASIC METABOLIC PANEL
Anion gap: 9 (ref 5–15)
BUN: 23 mg/dL — ABNORMAL HIGH (ref 6–20)
CO2: 26 mmol/L (ref 22–32)
Calcium: 9.2 mg/dL (ref 8.9–10.3)
Chloride: 103 mmol/L (ref 98–111)
Creatinine, Ser: 0.7 mg/dL (ref 0.44–1.00)
GFR, Estimated: >60 mL/min (ref >60)
Glucose, Bld: 161 mg/dL — ABNORMAL HIGH (ref 70–99)
Potassium: 4.3 mmol/L (ref 3.5–5.1)
Sodium: 138 mmol/L (ref 135–145)

## 2020-12-14 LAB — MAGNESIUM: Magnesium: 1.7 mg/dL (ref 1.7–2.4)

## 2020-12-14 LAB — PHOSPHORUS: Phosphorus: 5.5 mg/dL — ABNORMAL HIGH (ref 2.5–4.6)

## 2020-12-14 NOTE — Progress Notes (Signed)
PROGRESS NOTE    [name]  YWV:[id number] DOB: 1966-04-28 DOA: 10/10/2020 PCP: Fanny Bien, MD    Chief Complaint  Patient presents with   Wound Infection    Brief Narrative:  diabetes mellitus type 2, essential hypertension, chronic lymphedema as well as other comorbidities who was admitted and treated for skin breakdown and concern for a infected wound.  She is currently nonambulatory and lives alone and unable to care for self.  She has completed antibiotics and has been deemed medically stable to be discharged however disposition is difficult given her nonambulatory status and because SNF requires the patient to be under 450 pounds for her bariatric bed  Subjective:  Non interval changes, She needs lift to get out of bed, Medically stable to discharge to snf,  Awaiting for skilled nursing facility placement  Assessment & Plan:   Principal Problem:   Wound infection Active Problems:   Hyperkalemia   Chronic peripheral venous hypertension with lower extremity complication   Lymphedema   Obesity   Type 2 diabetes mellitus (HCC)   HTN (hypertension)   Cellulitis  Chronic bilateral lower extremity wounds/ lymphedema: -h/o chronic unhealing ulcers, she has chronic leg wound treated by wound care Dr. Quentin Cornwall since 2018 -On presentation patient noted to have increased pain, foul-smelling discharge from wounds -Status post 1 day of IV vancomycin and IV cefepime (10/10/2020>>> 10/11/2020) -Wound pink and clean, no odor  Chronic Pain -C/w Oxycodone 10 mg q4hprn Breakthrough Pain, Diclofenac 2 gram topically 4 times daily PRN, Acetaminophen 1000 mg po qHS and Acetaminophen 650 mg po q6hprn -Added Ketorolac 10 mg po q8hprn Moderate/Severe Pain  Noninsulin-dependent type 2 diabetes A1c 6.5 She is currently on metformin and SSI A.m. fasting blood glucose 153  Hypertension Currently on metoprolol    FTT Prior to this hospitalization she lives by herself at  home using lift chair to stand up for possible 4 to 10-second hold.  To her extrawide rolling walker with a reward seat sitting in a chair and then would propel her self to the kitchen She has been sleeping in her lift chair since December She lives by her self prior to admission Awaiting for snf    Morbid obesity: Body mass index is 67.63 kg/m.Marland Kitchen Seen by dietician.  I agree with the assessment and plan as outlined below: Nutrition Status: Nutrition Problem: Increased nutrient needs Etiology: wound healing Signs/Symptoms: estimated needs Interventions: Juven, Other (Comment) (Ensure Max)  .     Skin Assessment: I have examined the patient's skin and I agree with the wound assessment as performed by the wound care RN as outlined below:  Pressure Injury 10/11/20 Thigh Left;Posterior (Active)  10/11/20 0041  Location: Thigh  Location Orientation: Left;Posterior  Staging:   Wound Description (Comments):   Present on Admission:      Pressure Injury 10/11/20 Tibial Left;Posterior;Proximal (Active)  10/11/20 0041  Location: Tibial  Location Orientation: Left;Posterior;Proximal  Staging:   Wound Description (Comments):   Present on Admission:          DVT prophylaxis: rivaroxaban (XARELTO) tablet 10 mg Start: 11/27/20 1200 rivaroxaban (XARELTO) tablet 10 mg   Code Status: Full Family Communication: patient  Disposition:   Status is: Inpatient  Dispo: The patient is from: Home              Anticipated d/c is to: SNF              Anticipated d/c date is: Medically ready to discharge, awaiting for  SNF bed               Consultants:  Ortho Dr. Lorin Mercy on 6 1 Wound care  Procedures:  None  Antimicrobials:    Anti-infectives (From admission, onward)    Start     Dose/Rate Route Frequency Ordered Stop   10/11/20 1000  vancomycin (VANCOREADY) IVPB 2000 mg/400 mL  Status:  Discontinued        2,000 mg 200 mL/hr over 120 Minutes Intravenous Every 12 hours 10/10/20  2047 10/11/20 1140   10/11/20 0100  ceFEPIme (MAXIPIME) 2 g in sodium chloride 0.9 % 100 mL IVPB  Status:  Discontinued        2 g 200 mL/hr over 30 Minutes Intravenous Every 8 hours 10/10/20 2001 10/11/20 1140   10/10/20 2100  vancomycin (VANCOCIN) 2,500 mg in sodium chloride 0.9 % 500 mL IVPB        2,500 mg 250 mL/hr over 120 Minutes Intravenous  Once 10/10/20 2001 10/11/20 0135   10/10/20 1545  ceFAZolin (ANCEF) IVPB 2g/100 mL premix        2 g 200 mL/hr over 30 Minutes Intravenous  Once 10/10/20 1530 10/10/20 1723          Objective: Vitals:   12/13/20 1427 12/13/20 2155 12/14/20 0500 12/14/20 0522  BP: 110/74 137/69  117/72  Pulse: 87 (!) 101  89  Resp: 18 18  18   Temp: 98.3 F (36.8 C) 98.4 F (36.9 C)  98.2 F (36.8 C)  TempSrc:  Oral  Oral  SpO2: 96% 92%  90%  Weight:   (!) 213.8 kg   Height:        Intake/Output Summary (Last 24 hours) at 12/14/2020 1314 Last data filed at 12/14/2020 0522 Gross per 24 hour  Intake 480 ml  Output 1150 ml  Net -670 ml   Filed Weights   12/13/20 0500 12/13/20 1300 12/14/20 0500  Weight: (!) 216.8 kg (!) 213.5 kg (!) 213.8 kg    Examination:  General exam: calm, NAD Respiratory system: Clear to auscultation. Respiratory effort normal. Cardiovascular system: S1 & S2 heard, RRR.  Gastrointestinal system: Abdomen is nondistended, soft and nontender. Normal bowel sounds heard. Central nervous system: Alert and oriented. No focal neurological deficits. Extremities: Generalized weakness, chronic lymphedema Skin: Left lower extremity wound, dressing intact, no odor, no active drainage  Psychiatry: Judgement and insight appear normal. Mood & affect appropriate.     Data Reviewed: I have personally reviewed following labs and imaging studies  CBC: Recent Labs  Lab 12/10/20 0339  WBC 7.5  NEUTROABS 4.4  HGB 13.2  HCT 43.9  MCV 94.6  PLT 948    Basic Metabolic Panel: Recent Labs  Lab 12/10/20 0339 12/14/20 0339   NA 138 138  K 4.8 4.3  CL 103 103  CO2 27 26  GLUCOSE 153* 161*  BUN 30* 23*  CREATININE 0.75 0.70  CALCIUM 9.4 9.2  MG 1.8 1.7  PHOS 5.9* 5.5*    GFR: Estimated Creatinine Clearance: 164.4 mL/min (by C-G formula based on SCr of 0.7 mg/dL).  Liver Function Tests: Recent Labs  Lab 12/10/20 0339  AST 22  ALT 27  ALKPHOS 91  BILITOT 0.8  PROT 7.6  ALBUMIN 3.3*    CBG: Recent Labs  Lab 12/13/20 1156 12/13/20 1643 12/13/20 2157 12/14/20 0744 12/14/20 1113  GLUCAP 166* 139* 151* 159* 179*     No results found for this or any previous visit (from the past 240 hour(s)).  Radiology Studies: No results found.      Scheduled Meds:  acetaminophen  1,000 mg Oral QHS   ferrous sulfate  325 mg Oral Daily   hydrocortisone cream   Topical BID   hydrOXYzine  10 mg Oral TID   loratadine  10 mg Oral QPM   magnesium oxide  800 mg Oral BID   metFORMIN  500 mg Oral BID WC   metoprolol tartrate  12.5 mg Oral BID   nutrition supplement (JUVEN)  1 packet Oral BID BM   Ensure Max Protein  11 oz Oral BID   rivaroxaban  10 mg Oral Daily   senna  1 tablet Oral BID   Continuous Infusions:  sodium chloride Stopped (11/15/20 1816)     LOS: 58 days   Time spent: 81mins Greater than 50% of this time was spent in counseling, explanation of diagnosis, planning of further management, and coordination of care.   Voice Recognition Viviann Spare dictation system was used to create this note, attempts have been made to correct errors. Please contact the author with questions and/or clarifications.   Florencia Reasons, MD PhD FACP [facility]  Available via Epic secure chat 7am-7pm for nonurgent issues Please page for urgent issues To page the attending provider between 7A-7P or the covering provider during after hours 7P-7A, please log into the web site [email or web address] and access using universal [place] password for that web site. If you do not have the password, please call  the hospital operator.    12/14/2020, 1:14 PM

## 2020-12-15 LAB — GLUCOSE, CAPILLARY
Glucose-Capillary: 178 mg/dL — ABNORMAL HIGH (ref 70–99)
Glucose-Capillary: 171 mg/dL — ABNORMAL HIGH (ref 70–99)
Glucose-Capillary: 153 mg/dL — ABNORMAL HIGH (ref 70–99)
Glucose-Capillary: 164 mg/dL — ABNORMAL HIGH (ref 70–99)

## 2020-12-15 NOTE — Progress Notes (Signed)
PROGRESS NOTE    [name]  QQP:[id number] DOB: 09-19-1965 DOA: 10/10/2020 PCP: Fanny Bien, MD    Chief Complaint  Patient presents with   Wound Infection    Brief Narrative:  diabetes mellitus type 2, essential hypertension, chronic lymphedema as well as other comorbidities who was admitted and treated for skin breakdown and concern for a infected wound.  She is currently nonambulatory and lives alone and unable to care for self.  She has completed antibiotics and has been deemed medically stable to be discharged however disposition is difficult given her nonambulatory status and because SNF requires the patient to be under 450 pounds for her bariatric bed  Subjective:  Non interval changes,  Medically stable to discharge to snf,  Awaiting for skilled nursing facility placement  Assessment & Plan:   Principal Problem:   Wound infection Active Problems:   Hyperkalemia   Chronic peripheral venous hypertension with lower extremity complication   Lymphedema   Obesity   Type 2 diabetes mellitus (HCC)   HTN (hypertension)   Cellulitis  Chronic bilateral lower extremity wounds/ lymphedema: -h/o chronic unhealing ulcers, she has chronic leg wound treated by wound care Dr. Quentin Cornwall since 2018 -On presentation patient noted to have increased pain, foul-smelling discharge from wounds -Status post 1 day of IV vancomycin and IV cefepime (10/10/2020>>> 10/11/2020) -Wound pink and clean, no odor  Chronic Pain -C/w Oxycodone 10 mg q4hprn Breakthrough Pain, Diclofenac 2 gram topically 4 times daily PRN, Acetaminophen 1000 mg po qHS and Acetaminophen 650 mg po q6hprn -Added Ketorolac 10 mg po q8hprn Moderate/Severe Pain  Noninsulin-dependent type 2 diabetes A1c 6.5 She is currently on metformin and SSI  Hypomagnesemia On oral mag supplement, monitor   Hypertension Currently on metoprolol    FTT Prior to this hospitalization she lives by herself at home using  lift chair to stand up for possible 4 to 10-second hold.  To her extrawide rolling walker with a reward seat sitting in a chair and then would propel her self to the kitchen She has been sleeping in her lift chair since December She lives by her self prior to admission Awaiting for snf    Morbid obesity: Body mass index is 67.63 kg/m.Marland Kitchen Seen by dietician.  I agree with the assessment and plan as outlined below: Nutrition Status: Nutrition Problem: Increased nutrient needs Etiology: wound healing Signs/Symptoms: estimated needs Interventions: Juven, Other (Comment) (Ensure Max)  .     Skin Assessment: I have examined the patient's skin and I agree with the wound assessment as performed by the wound care RN as outlined below:  Pressure Injury 10/11/20 Thigh Left;Posterior (Active)  10/11/20 0041  Location: Thigh  Location Orientation: Left;Posterior  Staging:   Wound Description (Comments):   Present on Admission:      Pressure Injury 10/11/20 Tibial Left;Posterior;Proximal (Active)  10/11/20 0041  Location: Tibial  Location Orientation: Left;Posterior;Proximal  Staging:   Wound Description (Comments):   Present on Admission:          DVT prophylaxis: rivaroxaban (XARELTO) tablet 10 mg Start: 11/27/20 1200 rivaroxaban (XARELTO) tablet 10 mg   Code Status: Full Family Communication: patient  Disposition:   Status is: Inpatient  Dispo: The patient is from: Home              Anticipated d/c is to: SNF              Anticipated d/c date is: Medically ready to discharge, awaiting for SNF bed  Consultants:  Ortho Dr. Lorin Mercy on 6 1 Wound care  Procedures:  None  Antimicrobials:    Anti-infectives (From admission, onward)    Start     Dose/Rate Route Frequency Ordered Stop   10/11/20 1000  vancomycin (VANCOREADY) IVPB 2000 mg/400 mL  Status:  Discontinued        2,000 mg 200 mL/hr over 120 Minutes Intravenous Every 12 hours 10/10/20 2047  10/11/20 1140   10/11/20 0100  ceFEPIme (MAXIPIME) 2 g in sodium chloride 0.9 % 100 mL IVPB  Status:  Discontinued        2 g 200 mL/hr over 30 Minutes Intravenous Every 8 hours 10/10/20 2001 10/11/20 1140   10/10/20 2100  vancomycin (VANCOCIN) 2,500 mg in sodium chloride 0.9 % 500 mL IVPB        2,500 mg 250 mL/hr over 120 Minutes Intravenous  Once 10/10/20 2001 10/11/20 0135   10/10/20 1545  ceFAZolin (ANCEF) IVPB 2g/100 mL premix        2 g 200 mL/hr over 30 Minutes Intravenous  Once 10/10/20 1530 10/10/20 1723          Objective: Vitals:   12/14/20 1356 12/14/20 2206 12/15/20 0519 12/15/20 1431  BP: 124/78 119/68 (!) 150/86 131/90  Pulse: 90 98 95 95  Resp: 18 16 16  (!) 21  Temp: 98.5 F (36.9 C)  98.2 F (36.8 C) 98.5 F (36.9 C)  TempSrc:   Oral Oral  SpO2: 97% 96% 92% 96%  Weight:      Height:        Intake/Output Summary (Last 24 hours) at 12/15/2020 1525 Last data filed at 12/15/2020 1505 Gross per 24 hour  Intake 1200 ml  Output 2150 ml  Net -950 ml   Filed Weights   12/13/20 0500 12/13/20 1300 12/14/20 0500  Weight: (!) 216.8 kg (!) 213.5 kg (!) 213.8 kg    Examination:  General exam: calm, NAD Respiratory system: Clear to auscultation. Respiratory effort normal. Cardiovascular system: S1 & S2 heard, RRR.  Gastrointestinal system: Abdomen is nondistended, soft and nontender. Normal bowel sounds heard. Central nervous system: Alert and oriented. No focal neurological deficits. Extremities: Generalized weakness, chronic lymphedema Skin: Left lower extremity wound, dressing intact, no odor, no active drainage  Psychiatry: Judgement and insight appear normal. Mood & affect appropriate.     Data Reviewed: I have personally reviewed following labs and imaging studies  CBC: Recent Labs  Lab 12/10/20 0339  WBC 7.5  NEUTROABS 4.4  HGB 13.2  HCT 43.9  MCV 94.6  PLT 818    Basic Metabolic Panel: Recent Labs  Lab 12/10/20 0339 12/14/20 0339   NA 138 138  K 4.8 4.3  CL 103 103  CO2 27 26  GLUCOSE 153* 161*  BUN 30* 23*  CREATININE 0.75 0.70  CALCIUM 9.4 9.2  MG 1.8 1.7  PHOS 5.9* 5.5*    GFR: Estimated Creatinine Clearance: 164.4 mL/min (by C-G formula based on SCr of 0.7 mg/dL).  Liver Function Tests: Recent Labs  Lab 12/10/20 0339  AST 22  ALT 27  ALKPHOS 91  BILITOT 0.8  PROT 7.6  ALBUMIN 3.3*    CBG: Recent Labs  Lab 12/14/20 1113 12/14/20 1636 12/14/20 2202 12/15/20 0723 12/15/20 1155  GLUCAP 179* 174* 159* 178* 171*     No results found for this or any previous visit (from the past 240 hour(s)).       Radiology Studies: No results found.      Scheduled  Meds:  acetaminophen  1,000 mg Oral QHS   ferrous sulfate  325 mg Oral Daily   hydrocortisone cream   Topical BID   hydrOXYzine  10 mg Oral TID   loratadine  10 mg Oral QPM   magnesium oxide  800 mg Oral BID   metFORMIN  500 mg Oral BID WC   metoprolol tartrate  12.5 mg Oral BID   nutrition supplement (JUVEN)  1 packet Oral BID BM   Ensure Max Protein  11 oz Oral BID   rivaroxaban  10 mg Oral Daily   senna  1 tablet Oral BID   Continuous Infusions:  sodium chloride Stopped (11/15/20 1816)     LOS: 59 days   Time spent: 67mins Greater than 50% of this time was spent in counseling, explanation of diagnosis, planning of further management, and coordination of care.   Voice Recognition Viviann Spare dictation system was used to create this note, attempts have been made to correct errors. Please contact the author with questions and/or clarifications.   Florencia Reasons, MD PhD FACP [facility]  Available via Epic secure chat 7am-7pm for nonurgent issues Please page for urgent issues To page the attending provider between 7A-7P or the covering provider during after hours 7P-7A, please log into the web site [email or web address] and access using universal [place] password for that web site. If you do not have the password, please call  the hospital operator.    12/15/2020, 3:25 PM

## 2020-12-15 NOTE — Plan of Care (Signed)
Plan of care reviewed and discussed with the patient. 

## 2020-12-16 LAB — GLUCOSE, CAPILLARY
Glucose-Capillary: 148 mg/dL — ABNORMAL HIGH (ref 70–99)
Glucose-Capillary: 174 mg/dL — ABNORMAL HIGH (ref 70–99)
Glucose-Capillary: 154 mg/dL — ABNORMAL HIGH (ref 70–99)
Glucose-Capillary: 178 mg/dL — ABNORMAL HIGH (ref 70–99)

## 2020-12-16 LAB — MAGNESIUM: Magnesium: 1.6 mg/dL — ABNORMAL LOW (ref 1.7–2.4)

## 2020-12-16 LAB — BASIC METABOLIC PANEL
Anion gap: 10 (ref 5–15)
BUN: 21 mg/dL — ABNORMAL HIGH (ref 6–20)
CO2: 26 mmol/L (ref 22–32)
Calcium: 9.3 mg/dL (ref 8.9–10.3)
Chloride: 101 mmol/L (ref 98–111)
Creatinine, Ser: 0.55 mg/dL (ref 0.44–1.00)
GFR, Estimated: >60 mL/min (ref >60)
Glucose, Bld: 149 mg/dL — ABNORMAL HIGH (ref 70–99)
Potassium: 4.3 mmol/L (ref 3.5–5.1)
Sodium: 137 mmol/L (ref 135–145)

## 2020-12-16 NOTE — Progress Notes (Signed)
PROGRESS NOTE    [name]  NFA:[id number] DOB: 18-Mar-1966 DOA: 10/10/2020 PCP: Fanny Bien, MD   Chief Complain:Wound infection  Brief Narrative: Patient is a 55 year old female with PMH of diabetes mellitus type 2, essential hypertension, chronic lymphedema as well as other comorbidities who was admitted  skin breakdown and  was  infected wound on bilateral LE.  She is currently nonambulatory and lives alone and unable to care for self.  She has completed antibiotics and has been deemed medically stable to be discharged however disposition is difficult given her nonambulatory status and because SNF requires the patient to be under 450 pounds for her bariatric bed.TOC is following  Assessment & Plan:   Principal Problem:   Wound infection Active Problems:   Hyperkalemia   Chronic peripheral venous hypertension with lower extremity complication   Lymphedema   Obesity   Type 2 diabetes mellitus (HCC)   HTN (hypertension)   Cellulitis  Chronic bilateral lower extremity wounds/ lymphedema: -h/o chronic unhealing ulcers, she has chronic leg wound .She was being followed by wound care Dr. Quentin Cornwall since 2018 -On presentation patient noted to have increased pain, foul-smelling discharge from wounds -she was treated with broad spectrum abx -Currently the Wounds are pink and clean, no odor   Chronic Pain syndrome -C/w Oxycodone 10 mg q4hprn Breakthrough Pain, Diclofenac 2 gram topically 4 times daily PRN, Acetaminophen 1000 mg po qHS and Acetaminophen 650 mg po q6hprn -Added Ketorolac 10 mg po q8hprn Moderate/Severe Pain   Noninsulin-dependent type 2 diabetes Last A1c 6.5 She is currently on metformin and SSI   Hypomagnesemia On oral mag supplement, monitor     Hypertension Currently on metoprolol BP stable  Debility/deconditioning Due to morbid obesity.  She is hardly able to stand.  She has been sleeping in her lift chair since December last year.  She lives  alone.  Needs to skilled nursing facility.  Disposition: Difficult placement given her nonambulatory status,likely custodial care  and because SNF requires the patient to be under 450 pounds for her bariatric bed.TOC is following  Super morbid obesity: BMI of 67.6  Pressure Injury 10/11/20 Thigh Left;Posterior (Active)  10/11/20 0041  Location: Thigh  Location Orientation: Left;Posterior  Staging:   Wound Description (Comments):   Present on Admission:      Pressure Injury 10/11/20 Tibial Left;Posterior;Proximal (Active)  10/11/20 0041  Location: Tibial  Location Orientation: Left;Posterior;Proximal  Staging:   Wound Description (Comments):   Present on Admission:             Nutrition Problem: Increased nutrient needs Etiology: wound healing      DVT prophylaxis:Xarelto Code Status: Full Family Communication:  Status is: Inpatient  Remains inpatient appropriate because:Unsafe d/c plan  Dispo: The patient is from: Home              Anticipated d/c is to: SNF              Patient currently is medically stable to d/c.   Difficult to place patient Yes   Consultants: None  Procedures:None  Antimicrobials:  Anti-infectives (From admission, onward)    Start     Dose/Rate Route Frequency Ordered Stop   10/11/20 1000  vancomycin (VANCOREADY) IVPB 2000 mg/400 mL  Status:  Discontinued        2,000 mg 200 mL/hr over 120 Minutes Intravenous Every 12 hours 10/10/20 2047 10/11/20 1140   10/11/20 0100  ceFEPIme (MAXIPIME) 2 g in sodium chloride 0.9 % 100  mL IVPB  Status:  Discontinued        2 g 200 mL/hr over 30 Minutes Intravenous Every 8 hours 10/10/20 2001 10/11/20 1140   10/10/20 2100  vancomycin (VANCOCIN) 2,500 mg in sodium chloride 0.9 % 500 mL IVPB        2,500 mg 250 mL/hr over 120 Minutes Intravenous  Once 10/10/20 2001 10/11/20 0135   10/10/20 1545  ceFAZolin (ANCEF) IVPB 2g/100 mL premix        2 g 200 mL/hr over 30 Minutes Intravenous  Once 10/10/20  1530 10/10/20 1723       Subjective:  Patient seen and examined at bedside this morning.  Hemodynamically stable.  Lying on bed.  Complains of pain everywhere.  Denies any new complaints   Objective: Vitals:   12/15/20 0519 12/15/20 1431 12/15/20 2133 12/16/20 0521  BP: (!) 150/86 131/90 (!) 143/66 138/76  Pulse: 95 95 (!) 102 95  Resp: 16 (!) 21 20 20   Temp: 98.2 F (36.8 C) 98.5 F (36.9 C) 98.1 F (36.7 C) 98.3 F (36.8 C)  TempSrc: Oral Oral Oral Oral  SpO2: 92% 96% 98% 94%  Weight:      Height:        Intake/Output Summary (Last 24 hours) at 12/16/2020 1142 Last data filed at 12/16/2020 0847 Gross per 24 hour  Intake 1490 ml  Output 2200 ml  Net -710 ml   Filed Weights   12/13/20 0500 12/13/20 1300 12/14/20 0500  Weight: (!) 216.8 kg (!) 213.5 kg (!) 213.8 kg    Examination:  General exam: Not in distress, morbidly obese HEENT:PERRL,Oral mucosa moist, Ear/Nose normal on gross exam Respiratory system: Bilateral equal air entry, normal vesicular breath sounds, no wheezes or crackles  Cardiovascular system: S1 & S2 heard, RRR. No JVD, murmurs, rubs, gallops or clicks. No pedal edema. Gastrointestinal system: Abdomen is nondistended, soft and nontender. No organomegaly or masses felt. Normal bowel sounds heard. Central nervous system: Alert and oriented.  Extremities: Chronic lymphedema with skin changes on bilateral lower extremities. Skin: Shallow pinkish ulcer on the popliteal area of left extremity    Data Reviewed: I have personally reviewed following labs and imaging studies  CBC: Recent Labs  Lab 12/10/20 0339  WBC 7.5  NEUTROABS 4.4  HGB 13.2  HCT 43.9  MCV 94.6  PLT 412   Basic Metabolic Panel: Recent Labs  Lab 12/10/20 0339 12/14/20 0339 12/16/20 0259  NA 138 138 137  K 4.8 4.3 4.3  CL 103 103 101  CO2 27 26 26   GLUCOSE 153* 161* 149*  BUN 30* 23* 21*  CREATININE 0.75 0.70 0.55  CALCIUM 9.4 9.2 9.3  MG 1.8 1.7 1.6*  PHOS 5.9*  5.5*  --    GFR: Estimated Creatinine Clearance: 164.4 mL/min (by C-G formula based on SCr of 0.55 mg/dL). Liver Function Tests: Recent Labs  Lab 12/10/20 0339  AST 22  ALT 27  ALKPHOS 91  BILITOT 0.8  PROT 7.6  ALBUMIN 3.3*   No results for input(s): LIPASE, AMYLASE in the last 168 hours. No results for input(s): AMMONIA in the last 168 hours. Coagulation Profile: No results for input(s): INR, PROTIME in the last 168 hours. Cardiac Enzymes: No results for input(s): CKTOTAL, CKMB, CKMBINDEX, TROPONINI in the last 168 hours. BNP (last 3 results) No results for input(s): PROBNP in the last 8760 hours. HbA1C: No results for input(s): HGBA1C in the last 72 hours. CBG: Recent Labs  Lab 12/15/20 0723 12/15/20 1155  12/15/20 1646 12/15/20 2130 12/16/20 0727  GLUCAP 178* 171* 153* 164* 148*   Lipid Profile: No results for input(s): CHOL, HDL, LDLCALC, TRIG, CHOLHDL, LDLDIRECT in the last 72 hours. Thyroid Function Tests: No results for input(s): TSH, T4TOTAL, FREET4, T3FREE, THYROIDAB in the last 72 hours. Anemia Panel: No results for input(s): VITAMINB12, FOLATE, FERRITIN, TIBC, IRON, RETICCTPCT in the last 72 hours. Sepsis Labs: No results for input(s): PROCALCITON, LATICACIDVEN in the last 168 hours.  No results found for this or any previous visit (from the past 240 hour(s)).       Radiology Studies: No results found.      Scheduled Meds:  acetaminophen  1,000 mg Oral QHS   ferrous sulfate  325 mg Oral Daily   hydrocortisone cream   Topical BID   hydrOXYzine  10 mg Oral TID   loratadine  10 mg Oral QPM   magnesium oxide  800 mg Oral BID   metFORMIN  500 mg Oral BID WC   metoprolol tartrate  12.5 mg Oral BID   nutrition supplement (JUVEN)  1 packet Oral BID BM   Ensure Max Protein  11 oz Oral BID   rivaroxaban  10 mg Oral Daily   senna  1 tablet Oral BID   Continuous Infusions:  sodium chloride Stopped (11/15/20 1816)     LOS: 60 days    Time  spent:25 mins. More than 50% of that time was spent in counseling and/or coordination of care.      Shelly Coss, MD [facility] P6/27/2022, 11:42 AM

## 2020-12-16 NOTE — Plan of Care (Signed)
  Problem: Pain Managment: Goal: General experience of comfort will improve Outcome: Progressing   Problem: Safety: Goal: Ability to remain free from injury will improve Outcome: Progressing   Problem: Skin Integrity: Goal: Risk for impaired skin integrity will decrease Outcome: Progressing   

## 2020-12-17 LAB — GLUCOSE, CAPILLARY
Glucose-Capillary: 163 mg/dL — ABNORMAL HIGH (ref 70–99)
Glucose-Capillary: 174 mg/dL — ABNORMAL HIGH (ref 70–99)
Glucose-Capillary: 138 mg/dL — ABNORMAL HIGH (ref 70–99)
Glucose-Capillary: 167 mg/dL — ABNORMAL HIGH (ref 70–99)

## 2020-12-17 NOTE — Progress Notes (Signed)
PROGRESS NOTE    [name]  OFB:[id number] DOB: Dec 24, 1965 DOA: 10/10/2020 PCP: Fanny Bien, MD   Chief Complain:Wound infection  Brief Narrative: Patient is a 55 year old female with PMH of diabetes mellitus type 2, essential hypertension, chronic lymphedema as well as other comorbidities who was admitted  skin breakdown and  was  infected wound on bilateral LE.  She is currently nonambulatory and lives alone and unable to care for self.  She has completed antibiotics and has been deemed medically stable to be discharged however disposition is difficult given her nonambulatory status and because SNF requires the patient to be under 450 pounds for her bariatric bed.TOC is following  Assessment & Plan:   Principal Problem:   Wound infection Active Problems:   Hyperkalemia   Chronic peripheral venous hypertension with lower extremity complication   Lymphedema   Obesity   Type 2 diabetes mellitus (HCC)   HTN (hypertension)   Cellulitis  Chronic bilateral lower extremity wounds/ lymphedema: -h/o chronic unhealing ulcers, she has chronic leg wound .She was being followed by wound care Dr. Quentin Cornwall since 2018 -On presentation patient noted to have increased pain, foul-smelling discharge from wounds -she was treated with broad spectrum abx -Currently the Wounds are pink and clean, no odor   Chronic Pain syndrome -C/w Oxycodone 10 mg q4hprn Breakthrough Pain, Diclofenac 2 gram topically 4 times daily PRN, Acetaminophen 1000 mg po qHS and Acetaminophen 650 mg po q6hprn -Added Ketorolac 10 mg po q8hprn Moderate/Severe Pain   Noninsulin-dependent type 2 diabetes Last A1c 6.5 She is currently on metformin and SSI   Hypomagnesemia On oral mag supplement, monitor   Hypertension Currently on metoprolol BP stable  Debility/deconditioning Due to morbid obesity.  She is hardly able to stand.  She has been sleeping in her lift chair since December last year.  She lives  alone.  Needs skilled nursing facility on DC  Disposition: Difficult placement given her nonambulatory status,likely custodial care  and because SNF requires the patient to be under 450 pounds for her bariatric bed.TOC is following  Super morbid obesity: BMI of 67.6  Pressure Injury 10/11/20 Thigh Left;Posterior (Active)  10/11/20 0041  Location: Thigh  Location Orientation: Left;Posterior  Staging:   Wound Description (Comments):   Present on Admission:      Pressure Injury 10/11/20 Tibial Left;Posterior;Proximal (Active)  10/11/20 0041  Location: Tibial  Location Orientation: Left;Posterior;Proximal  Staging:   Wound Description (Comments):   Present on Admission:             Nutrition Problem: Increased nutrient needs Etiology: wound healing      DVT prophylaxis:Xarelto Code Status: Full Family Communication: None at bedside Status is: Inpatient  Remains inpatient appropriate because:Unsafe d/c plan  Dispo: The patient is from: Home              Anticipated d/c is to: SNF              Patient currently is medically stable to d/c.   Difficult to place patient Yes   Consultants: None  Procedures:None  Antimicrobials:  Anti-infectives (From admission, onward)    Start     Dose/Rate Route Frequency Ordered Stop   10/11/20 1000  vancomycin (VANCOREADY) IVPB 2000 mg/400 mL  Status:  Discontinued        2,000 mg 200 mL/hr over 120 Minutes Intravenous Every 12 hours 10/10/20 2047 10/11/20 1140   10/11/20 0100  ceFEPIme (MAXIPIME) 2 g in sodium chloride 0.9 %  100 mL IVPB  Status:  Discontinued        2 g 200 mL/hr over 30 Minutes Intravenous Every 8 hours 10/10/20 2001 10/11/20 1140   10/10/20 2100  vancomycin (VANCOCIN) 2,500 mg in sodium chloride 0.9 % 500 mL IVPB        2,500 mg 250 mL/hr over 120 Minutes Intravenous  Once 10/10/20 2001 10/11/20 0135   10/10/20 1545  ceFAZolin (ANCEF) IVPB 2g/100 mL premix        2 g 200 mL/hr over 30 Minutes  Intravenous  Once 10/10/20 1530 10/10/20 1723       Subjective:  Patient briefly seen at the bedside today.  She does not have any new complaints.  Appears comfortable, lying in bed  Objective: Vitals:   12/16/20 0521 12/16/20 1357 12/16/20 2112 12/17/20 0549  BP: 138/76 122/85 126/67 (!) 141/89  Pulse: 95 92 97 (!) 101  Resp: 20 20 16 19   Temp: 98.3 F (36.8 C) 98.8 F (37.1 C) 99 F (37.2 C) 98.7 F (37.1 C)  TempSrc: Oral  Oral Oral  SpO2: 94% 94% 97% 91%  Weight:      Height:        Intake/Output Summary (Last 24 hours) at 12/17/2020 0800 Last data filed at 12/17/2020 0549 Gross per 24 hour  Intake 470 ml  Output 1650 ml  Net -1180 ml   Filed Weights   12/13/20 0500 12/13/20 1300 12/14/20 0500  Weight: (!) 216.8 kg (!) 213.5 kg (!) 213.8 kg    Examination:  General exam: Overall comfortable, morbidly obese HEENT: PERRL Respiratory system:  no wheezes or crackles  Cardiovascular system: S1 & S2 heard, RRR.  Gastrointestinal system: Abdomen is nondistended, soft and nontender. Central nervous system: Alert and oriented Extremities: Severe bilateral lymphedema with skin changes Skin: Shallow pinkish ulcer on the popliteal area of left lower extremity    Data Reviewed: I have personally reviewed following labs and imaging studies  CBC: No results for input(s): WBC, NEUTROABS, HGB, HCT, MCV, PLT in the last 168 hours.  Basic Metabolic Panel: Recent Labs  Lab 12/14/20 0339 12/16/20 0259  NA 138 137  K 4.3 4.3  CL 103 101  CO2 26 26  GLUCOSE 161* 149*  BUN 23* 21*  CREATININE 0.70 0.55  CALCIUM 9.2 9.3  MG 1.7 1.6*  PHOS 5.5*  --    GFR: Estimated Creatinine Clearance: 164.4 mL/min (by C-G formula based on SCr of 0.55 mg/dL). Liver Function Tests: No results for input(s): AST, ALT, ALKPHOS, BILITOT, PROT, ALBUMIN in the last 168 hours.  No results for input(s): LIPASE, AMYLASE in the last 168 hours. No results for input(s): AMMONIA in the last  168 hours. Coagulation Profile: No results for input(s): INR, PROTIME in the last 168 hours. Cardiac Enzymes: No results for input(s): CKTOTAL, CKMB, CKMBINDEX, TROPONINI in the last 168 hours. BNP (last 3 results) No results for input(s): PROBNP in the last 8760 hours. HbA1C: No results for input(s): HGBA1C in the last 72 hours. CBG: Recent Labs  Lab 12/16/20 0727 12/16/20 1155 12/16/20 1644 12/16/20 2126 12/17/20 0738  GLUCAP 148* 174* 154* 178* 163*   Lipid Profile: No results for input(s): CHOL, HDL, LDLCALC, TRIG, CHOLHDL, LDLDIRECT in the last 72 hours. Thyroid Function Tests: No results for input(s): TSH, T4TOTAL, FREET4, T3FREE, THYROIDAB in the last 72 hours. Anemia Panel: No results for input(s): VITAMINB12, FOLATE, FERRITIN, TIBC, IRON, RETICCTPCT in the last 72 hours. Sepsis Labs: No results for input(s): PROCALCITON,  LATICACIDVEN in the last 168 hours.  No results found for this or any previous visit (from the past 240 hour(s)).       Radiology Studies: No results found.      Scheduled Meds:  acetaminophen  1,000 mg Oral QHS   ferrous sulfate  325 mg Oral Daily   hydrocortisone cream   Topical BID   hydrOXYzine  10 mg Oral TID   loratadine  10 mg Oral QPM   magnesium oxide  800 mg Oral BID   metFORMIN  500 mg Oral BID WC   metoprolol tartrate  12.5 mg Oral BID   nutrition supplement (JUVEN)  1 packet Oral BID BM   Ensure Max Protein  11 oz Oral BID   rivaroxaban  10 mg Oral Daily   senna  1 tablet Oral BID   Continuous Infusions:  sodium chloride Stopped (11/15/20 1816)     LOS: 61 days    Time spent:25 mins. More than 50% of that time was spent in counseling and/or coordination of care.      Shelly Coss, MD [facility] P6/28/2022, 8:00 AM

## 2020-12-17 NOTE — Plan of Care (Signed)

## 2020-12-17 NOTE — TOC Progression Note (Signed)
Transition of Care [facility]) - Progression Note    Patient Details  Name: RIATA IKEDA MRN: [id number] Date of Birth: [birth year]  [facility] of [facility]) CM/SW Contact  Lennart Pall, LCSW Phone Number: 12/17/2020, 2:33 PM  Clinical Narrative:    Have reached back out to [facility] to inform pt's weight is now 468lbs (had been told they would consider if under 470).  They are now declining due to "no staffing to manage her care needs."  Continue to seek other facilities.   Expected Discharge Plan: Skilled Nursing Facility Barriers to Discharge: Ship broker  Expected Discharge Plan and Services Expected Discharge Plan: [place] In-house Referral: Clinical Social Work Discharge Planning Services: CM Consult Post Acute Care Choice: [place] Living arrangements for the past 2 months: Apartment                                       Social Determinants of Health (SDOH) Interventions    Readmission Risk Interventions No flowsheet data found.

## 2020-12-18 LAB — GLUCOSE, CAPILLARY
Glucose-Capillary: 181 mg/dL — ABNORMAL HIGH (ref 70–99)
Glucose-Capillary: 163 mg/dL — ABNORMAL HIGH (ref 70–99)
Glucose-Capillary: 140 mg/dL — ABNORMAL HIGH (ref 70–99)
Glucose-Capillary: 160 mg/dL — ABNORMAL HIGH (ref 70–99)

## 2020-12-18 NOTE — Plan of Care (Signed)

## 2020-12-18 NOTE — Progress Notes (Signed)
Physical Therapy Treatment Patient Details Name: [name] MRN: [id number] DOB: 07/31/1965 Today's Date: 12/18/2020    History of Present Illness Pt admitted from home with multiple LE wounds, and FTT and unable to perform basic self care.  Pt reports she stayed in lift chair getting up only twice a day for BM or getting food.  Pt utilizing Rollator around home but pivoted to it from lift chair and wheeled around in seated position.  Pt states she put pads and diapers in chair so she did not have to get up to urinate.  Pt with hx of DM and lymphadema (has been unable to use her pressure sleeves so lymphadema has significantly worsened since last December).    PT Comments    Patient    in bed, began tilting   bed , tolerated to 22 * only, stating knee pain too great. Patient   taken  out of tilt. Patient managed HOB and Leg control  to place self in semi chair position with HOB 50* and KNees flexed at ~ 40*. Patient remained in this position and will try to stay for 1 hour. Will need to discuss skilled PT  indication  as Patient is not  able to tolerate  standing.  Follow Up Recommendations  SNF     Equipment Recommendations    NONe   Recommendations for Other Services       Precautions / Restrictions Precautions Precautions: Fall Precaution Comments: posterior and medial LLE wounds, place bed pad  to cushion when maxiskying.    Mobility  Bed Mobility                 Start Time: 1620 Angle: 22 degrees Total Minutes in Angle: 4 minutes  Transfers                    Ambulation/Gait                 Stairs             Wheelchair Mobility    Modified Rankin (Stroke Patients Only)       Balance                                            Cognition Arousal/Alertness: Awake/alert                                            Exercises      General Comments        Pertinent Vitals/Pain Faces  Pain Scale: Hurts whole lot Pain Location: both knes and hips when tilted to 22 * Pain Descriptors / Indicators: Grimacing;Moaning Pain Intervention(s): Monitored during session;Premedicated before session    Home Living                      Prior Function            PT Goals (current goals can now be found in the care plan section) Progress towards PT goals: Not progressing toward goals - comment (painfl knees today.)    Frequency    Min 2X/week      PT Plan Current plan remains appropriate    Co-evaluation  AM-PAC PT "6 Clicks" Mobility   Outcome Measure  Help needed turning from your back to your side while in a flat bed without using bedrails?: Total Help needed moving from lying on your back to sitting on the side of a flat bed without using bedrails?: Total Help needed moving to and from a bed to a chair (including a wheelchair)?: Total Help needed standing up from a chair using your arms (e.g., wheelchair or bedside chair)?: Total Help needed to walk in hospital room?: Total Help needed climbing 3-5 steps with a railing? : Total 6 Click Score: 6    End of Session   Activity Tolerance: Patient limited by pain Patient left:  (in partiall chair position in bed) Nurse Communication: Mobility status;Need for lift equipment PT Visit Diagnosis: Muscle weakness (generalized) (M62.81);Difficulty in walking, not elsewhere classified (R26.2);Pain;Adult, failure to thrive (R62.7) Pain - Right/Left: Right Pain - part of body: Knee;Leg;Hip     Time: 4469-5072 PT Time Calculation (min) (ACUTE ONLY): 37 min  Charges:  $Therapeutic Activity: 23-37 mins                     [name] PT Acute Rehabilitation Services Pager [phone] Office [phone]    [name] 12/18/2020, 5:16 PM

## 2020-12-18 NOTE — TOC Progression Note (Signed)
Transition of Care [facility]) - Progression Note    Patient Details  Name: [name] MRN: [id number] Date of Birth: Feb 07, 1966  Transition of [facility]) CM/SW Contact  Lennart Pall, LCSW Phone Number: 12/18/2020, 2:21 PM  Clinical Narrative:    Have re-contacted the following SNFs (now that pt's weight is trending down): [place]  All have declined  [facility] still reviewing case.   Expected Discharge Plan: Skilled Nursing Facility Barriers to Discharge: Ship broker  Expected Discharge Plan and Services Expected Discharge Plan: [place] In-house Referral: Clinical Social Work Discharge Planning Services: CM Consult Post Acute Care Choice: [place] Living arrangements for the past 2 months: Apartment                                       Social Determinants of Health (SDOH) Interventions    Readmission Risk Interventions No flowsheet data found.

## 2020-12-18 NOTE — Plan of Care (Signed)
  Problem: Nutrition: Goal: Adequate nutrition will be maintained Outcome: Progressing   Problem: Coping: Goal: Level of anxiety will decrease Outcome: Progressing   Problem: Pain Managment: Goal: General experience of comfort will improve Outcome: Progressing   Problem: Safety: Goal: Ability to remain free from injury will improve Outcome: Progressing   

## 2020-12-18 NOTE — Progress Notes (Signed)
PROGRESS NOTE    [name]  PNT:[id number] DOB: 04-Mar-1966 DOA: 10/10/2020 PCP: Fanny Bien, MD   Chief Complain:Wound infection  Brief Narrative: Patient is a 55 year old female with PMH of diabetes mellitus type 2, essential hypertension, chronic lymphedema as well as other comorbidities who was admitted  skin breakdown and  was  infected wound on bilateral LE.  She is currently nonambulatory and lives alone and unable to care for self.  She has completed antibiotics and has been deemed medically stable to be discharged however disposition is difficult given her nonambulatory status and because SNF requires the patient to be under 450 pounds for her bariatric bed.TOC is following  Assessment & Plan:   Principal Problem:   Wound infection Active Problems:   Hyperkalemia   Chronic peripheral venous hypertension with lower extremity complication   Lymphedema   Obesity   Type 2 diabetes mellitus (HCC)   HTN (hypertension)   Cellulitis  Chronic bilateral lower extremity wounds/ lymphedema: -h/o chronic unhealing ulcers, she has chronic leg wound .She was being followed by wound care Dr. Quentin Cornwall since 2018 -On presentation patient noted to have increased pain, foul-smelling discharge from wounds -she was treated with broad spectrum abx -Currently the Wounds are pink and clean, no odor   Chronic Pain syndrome -C/w Oxycodone 10 mg q4hprn Breakthrough Pain, Diclofenac 2 gram topically 4 times daily PRN, Acetaminophen 1000 mg po qHS and Acetaminophen 650 mg po q6hprn -Added Ketorolac 10 mg po q8hprn Moderate/Severe Pain   Noninsulin-dependent type 2 diabetes Last A1c 6.5 She is currently on metformin and SSI   Hypomagnesemia On oral mag supplement, monitor   Hypertension Currently on metoprolol BP stable  Debility/deconditioning Due to morbid obesity.  She is hardly able to stand.  She has been sleeping in her lift chair since December last year.  She lives  alone.  Needs skilled nursing facility on DC  Disposition: Difficult placement given her nonambulatory status,likely custodial care  and because SNF requires the patient to be under 450 pounds for her bariatric bed.TOC is following  Super morbid obesity: BMI of 67.6  Pressure Injury 10/11/20 Thigh Left;Posterior (Active)  10/11/20 0041  Location: Thigh  Location Orientation: Left;Posterior  Staging:   Wound Description (Comments):   Present on Admission:      Pressure Injury 10/11/20 Tibial Left;Posterior;Proximal (Active)  10/11/20 0041  Location: Tibial  Location Orientation: Left;Posterior;Proximal  Staging:   Wound Description (Comments):   Present on Admission:             Nutrition Problem: Increased nutrient needs Etiology: wound healing      DVT prophylaxis:Xarelto Code Status: Full Family Communication: None at bedside Status is: Inpatient  Remains inpatient appropriate because:Unsafe d/c plan  Dispo: The patient is from: Home              Anticipated d/c is to: SNF              Patient currently is medically stable to d/c.   Difficult to place patient Yes   Consultants: None  Procedures:None  Antimicrobials:  Anti-infectives (From admission, onward)    Start     Dose/Rate Route Frequency Ordered Stop   10/11/20 1000  vancomycin (VANCOREADY) IVPB 2000 mg/400 mL  Status:  Discontinued        2,000 mg 200 mL/hr over 120 Minutes Intravenous Every 12 hours 10/10/20 2047 10/11/20 1140   10/11/20 0100  ceFEPIme (MAXIPIME) 2 g in sodium chloride 0.9 %  100 mL IVPB  Status:  Discontinued        2 g 200 mL/hr over 30 Minutes Intravenous Every 8 hours 10/10/20 2001 10/11/20 1140   10/10/20 2100  vancomycin (VANCOCIN) 2,500 mg in sodium chloride 0.9 % 500 mL IVPB        2,500 mg 250 mL/hr over 120 Minutes Intravenous  Once 10/10/20 2001 10/11/20 0135   10/10/20 1545  ceFAZolin (ANCEF) IVPB 2g/100 mL premix        2 g 200 mL/hr over 30 Minutes  Intravenous  Once 10/10/20 1530 10/10/20 1723       Subjective:  Patient seen at the bedside this morning.  Hemodynamically stable.  No new complaints   Objective: Vitals:   12/17/20 1428 12/17/20 2248 12/18/20 0610 12/18/20 0700  BP:  110/60 137/83   Pulse:  (!) 101 (!) 102   Resp:  16 18   Temp:  98.8 F (37.1 C) 98 F (36.7 C)   TempSrc:  Oral Oral   SpO2:  95% 92%   Weight: (!) 212.5 kg   (!) 211.1 kg  Height:        Intake/Output Summary (Last 24 hours) at 12/18/2020 1246 Last data filed at 12/18/2020 0600 Gross per 24 hour  Intake 960 ml  Output 1000 ml  Net -40 ml   Filed Weights   12/14/20 0500 12/17/20 1428 12/18/20 0700  Weight: (!) 213.8 kg (!) 212.5 kg (!) 211.1 kg    Examination:  General exam: Overall comfortable, morbidly obese, lying on bed   Data Reviewed: I have personally reviewed following labs and imaging studies  CBC: No results for input(s): WBC, NEUTROABS, HGB, HCT, MCV, PLT in the last 168 hours.  Basic Metabolic Panel: Recent Labs  Lab 12/14/20 0339 12/16/20 0259  NA 138 137  K 4.3 4.3  CL 103 101  CO2 26 26  GLUCOSE 161* 149*  BUN 23* 21*  CREATININE 0.70 0.55  CALCIUM 9.2 9.3  MG 1.7 1.6*  PHOS 5.5*  --    GFR: Estimated Creatinine Clearance: 163 mL/min (by C-G formula based on SCr of 0.55 mg/dL). Liver Function Tests: No results for input(s): AST, ALT, ALKPHOS, BILITOT, PROT, ALBUMIN in the last 168 hours.  No results for input(s): LIPASE, AMYLASE in the last 168 hours. No results for input(s): AMMONIA in the last 168 hours. Coagulation Profile: No results for input(s): INR, PROTIME in the last 168 hours. Cardiac Enzymes: No results for input(s): CKTOTAL, CKMB, CKMBINDEX, TROPONINI in the last 168 hours. BNP (last 3 results) No results for input(s): PROBNP in the last 8760 hours. HbA1C: No results for input(s): HGBA1C in the last 72 hours. CBG: Recent Labs  Lab 12/17/20 1125 12/17/20 1656 12/17/20 2251  12/18/20 0753 12/18/20 1155  GLUCAP 174* 138* 167* 181* 163*   Lipid Profile: No results for input(s): CHOL, HDL, LDLCALC, TRIG, CHOLHDL, LDLDIRECT in the last 72 hours. Thyroid Function Tests: No results for input(s): TSH, T4TOTAL, FREET4, T3FREE, THYROIDAB in the last 72 hours. Anemia Panel: No results for input(s): VITAMINB12, FOLATE, FERRITIN, TIBC, IRON, RETICCTPCT in the last 72 hours. Sepsis Labs: No results for input(s): PROCALCITON, LATICACIDVEN in the last 168 hours.  No results found for this or any previous visit (from the past 240 hour(s)).       Radiology Studies: No results found.      Scheduled Meds:  acetaminophen  1,000 mg Oral QHS   ferrous sulfate  325 mg Oral Daily  hydrocortisone cream   Topical BID   hydrOXYzine  10 mg Oral TID   loratadine  10 mg Oral QPM   magnesium oxide  800 mg Oral BID   metFORMIN  500 mg Oral BID WC   metoprolol tartrate  12.5 mg Oral BID   nutrition supplement (JUVEN)  1 packet Oral BID BM   Ensure Max Protein  11 oz Oral BID   rivaroxaban  10 mg Oral Daily   senna  1 tablet Oral BID   Continuous Infusions:  sodium chloride Stopped (11/15/20 1816)     LOS: 62 days    Time spent:25 mins. More than 50% of that time was spent in counseling and/or coordination of care.      Shelly Coss, MD [facility] P6/29/2022, 12:46 PM

## 2020-12-19 LAB — GLUCOSE, CAPILLARY
Glucose-Capillary: 148 mg/dL — ABNORMAL HIGH (ref 70–99)
Glucose-Capillary: 150 mg/dL — ABNORMAL HIGH (ref 70–99)
Glucose-Capillary: 157 mg/dL — ABNORMAL HIGH (ref 70–99)

## 2020-12-19 NOTE — Plan of Care (Signed)
  Problem: Coping: Goal: Level of anxiety will decrease Outcome: Progressing   Problem: Pain Managment: Goal: General experience of comfort will improve Outcome: Progressing   Problem: Safety: Goal: Ability to remain free from injury will improve Outcome: Progressing   

## 2020-12-19 NOTE — Progress Notes (Signed)
PROGRESS NOTE    [name]  OEU:[id number] DOB: 1966-01-24 DOA: 10/10/2020 PCP: Fanny Bien, MD   Chief Complain:Wound infection  Brief Narrative: Patient is a 55 year old female with PMH of diabetes mellitus type 2, essential hypertension, chronic lymphedema as well as other comorbidities who was admitted  skin breakdown and  was  infected wound on bilateral LE.  She is currently nonambulatory and lives alone and unable to care for self.  She has completed antibiotics and has been deemed medically stable to be discharged however disposition is difficult given her nonambulatory status and because SNF requires the patient to be under 450 pounds for her bariatric bed.TOC is following  Assessment & Plan:   Principal Problem:   Wound infection Active Problems:   Hyperkalemia   Chronic peripheral venous hypertension with lower extremity complication   Lymphedema   Obesity   Type 2 diabetes mellitus (HCC)   HTN (hypertension)   Cellulitis  Chronic bilateral lower extremity wounds/ lymphedema: -h/o chronic unhealing ulcers, she has chronic leg wound .She was being followed by wound care Dr. Quentin Cornwall since 2018 -On presentation patient noted to have increased pain, foul-smelling discharge from wounds -she was treated with broad spectrum abx -Currently the Wounds are pink and clean, no odor   Chronic Pain syndrome -C/w Oxycodone 10 mg q4hprn Breakthrough Pain, Diclofenac 2 gram topically 4 times daily PRN, Acetaminophen 1000 mg po qHS and Acetaminophen 650 mg po q6hprn -Added Ketorolac 10 mg po q8hprn Moderate/Severe Pain   Noninsulin-dependent type 2 diabetes Last A1c 6.5 She is currently on metformin and SSI   Hypomagnesemia On oral mag supplement, monitor   Hypertension Currently on metoprolol BP stable  Debility/deconditioning Due to morbid obesity.  She is hardly able to stand.  She has been sleeping in her lift chair since December last year.  She lives  alone.  Needs skilled nursing facility on DC  Disposition: Difficult placement given her nonambulatory status,likely custodial care  and because SNF requires the patient to be under 450 pounds for her bariatric bed.TOC is following  Super morbid obesity: BMI of 67.6  Pressure Injury 10/11/20 Thigh Left;Posterior (Active)  10/11/20 0041  Location: Thigh  Location Orientation: Left;Posterior  Staging:   Wound Description (Comments):   Present on Admission:      Pressure Injury 10/11/20 Tibial Left;Posterior;Proximal (Active)  10/11/20 0041  Location: Tibial  Location Orientation: Left;Posterior;Proximal  Staging:   Wound Description (Comments):   Present on Admission:             Nutrition Problem: Increased nutrient needs Etiology: wound healing      DVT prophylaxis:Xarelto Code Status: Full Family Communication: None at bedside Status is: Inpatient  Remains inpatient appropriate because:Unsafe d/c plan  Dispo: The patient is from: Home              Anticipated d/c is to: SNF              Patient currently is medically stable to d/c.   Difficult to place patient Yes   Consultants: None  Procedures:None  Antimicrobials:  Anti-infectives (From admission, onward)    Start     Dose/Rate Route Frequency Ordered Stop   10/11/20 1000  vancomycin (VANCOREADY) IVPB 2000 mg/400 mL  Status:  Discontinued        2,000 mg 200 mL/hr over 120 Minutes Intravenous Every 12 hours 10/10/20 2047 10/11/20 1140   10/11/20 0100  ceFEPIme (MAXIPIME) 2 g in sodium chloride 0.9 %  100 mL IVPB  Status:  Discontinued        2 g 200 mL/hr over 30 Minutes Intravenous Every 8 hours 10/10/20 2001 10/11/20 1140   10/10/20 2100  vancomycin (VANCOCIN) 2,500 mg in sodium chloride 0.9 % 500 mL IVPB        2,500 mg 250 mL/hr over 120 Minutes Intravenous  Once 10/10/20 2001 10/11/20 0135   10/10/20 1545  ceFAZolin (ANCEF) IVPB 2g/100 mL premix        2 g 200 mL/hr over 30 Minutes  Intravenous  Once 10/10/20 1530 10/10/20 1723       Subjective:  Patient seen at the bedside this morning.  Comfortable, lying on bed.  She asked me to increase the dose of oxycodone.   Objective: Vitals:   12/18/20 0700 12/18/20 1451 12/18/20 2122 12/19/20 0523  BP:  110/69 (!) 151/94 137/89  Pulse:  93 (!) 104 98  Resp:  18 20 20   Temp:  98.4 F (36.9 C) 98.5 F (36.9 C) 98 F (36.7 C)  TempSrc:  Oral Oral Oral  SpO2:  94% 93% 93%  Weight: (!) 211.1 kg   (!) 210.2 kg  Height:        Intake/Output Summary (Last 24 hours) at 12/19/2020 1137 Last data filed at 12/19/2020 0600 Gross per 24 hour  Intake 720 ml  Output 450 ml  Net 270 ml   Filed Weights   12/17/20 1428 12/18/20 0700 12/19/20 0523  Weight: (!) 212.5 kg (!) 211.1 kg (!) 210.2 kg    Examination:  General exam: Comfortable, morbidly obese, lying on bed as usual  Data Reviewed: I have personally reviewed following labs and imaging studies  CBC: No results for input(s): WBC, NEUTROABS, HGB, HCT, MCV, PLT in the last 168 hours.  Basic Metabolic Panel: Recent Labs  Lab 12/14/20 0339 12/16/20 0259  NA 138 137  K 4.3 4.3  CL 103 101  CO2 26 26  GLUCOSE 161* 149*  BUN 23* 21*  CREATININE 0.70 0.55  CALCIUM 9.2 9.3  MG 1.7 1.6*  PHOS 5.5*  --    GFR: Estimated Creatinine Clearance: 162.6 mL/min (by C-G formula based on SCr of 0.55 mg/dL). Liver Function Tests: No results for input(s): AST, ALT, ALKPHOS, BILITOT, PROT, ALBUMIN in the last 168 hours.  No results for input(s): LIPASE, AMYLASE in the last 168 hours. No results for input(s): AMMONIA in the last 168 hours. Coagulation Profile: No results for input(s): INR, PROTIME in the last 168 hours. Cardiac Enzymes: No results for input(s): CKTOTAL, CKMB, CKMBINDEX, TROPONINI in the last 168 hours. BNP (last 3 results) No results for input(s): PROBNP in the last 8760 hours. HbA1C: No results for input(s): HGBA1C in the last 72  hours. CBG: Recent Labs  Lab 12/18/20 0753 12/18/20 1155 12/18/20 1701 12/18/20 2123 12/19/20 0744  GLUCAP 181* 163* 140* 160* 148*   Lipid Profile: No results for input(s): CHOL, HDL, LDLCALC, TRIG, CHOLHDL, LDLDIRECT in the last 72 hours. Thyroid Function Tests: No results for input(s): TSH, T4TOTAL, FREET4, T3FREE, THYROIDAB in the last 72 hours. Anemia Panel: No results for input(s): VITAMINB12, FOLATE, FERRITIN, TIBC, IRON, RETICCTPCT in the last 72 hours. Sepsis Labs: No results for input(s): PROCALCITON, LATICACIDVEN in the last 168 hours.  No results found for this or any previous visit (from the past 240 hour(s)).       Radiology Studies: No results found.      Scheduled Meds:  acetaminophen  1,000 mg Oral QHS  ferrous sulfate  325 mg Oral Daily   hydrocortisone cream   Topical BID   hydrOXYzine  10 mg Oral TID   loratadine  10 mg Oral QPM   magnesium oxide  800 mg Oral BID   metFORMIN  500 mg Oral BID WC   metoprolol tartrate  12.5 mg Oral BID   nutrition supplement (JUVEN)  1 packet Oral BID BM   Ensure Max Protein  11 oz Oral BID   rivaroxaban  10 mg Oral Daily   senna  1 tablet Oral BID   Continuous Infusions:  sodium chloride Stopped (11/15/20 1816)     LOS: 63 days    Time spent:15 mins. More than 50% of that time was spent in counseling and/or coordination of care.      Shelly Coss, MD [facility] P6/30/2022, 11:37 AM

## 2020-12-19 NOTE — TOC Progression Note (Signed)
Transition of Care [facility]) - Progression Note    Patient Details  Name: [name] MRN: [id number] Date of Birth: Jul 10, 1965  [facility] CM/SW Contact  Lennart Pall, LCSW Phone Number: 12/19/2020, 2:34 PM  Clinical Narrative:    Pt weight now down to 463lbs.  Have reached back out to facilities who had initially denied who had limits of 450 to see if they could reconsider. All have declined either due to weight or no bed availability.    Expected Discharge Plan: Skilled Nursing Facility Barriers to Discharge: Ship broker  Expected Discharge Plan and Services Expected Discharge Plan: [place] In-house Referral: Clinical Social Work Discharge Planning Services: CM Consult Post Acute Care Choice: [place] Living arrangements for the past 2 months: Apartment                                       Social Determinants of Health (SDOH) Interventions    Readmission Risk Interventions No flowsheet data found.

## 2020-12-20 LAB — GLUCOSE, CAPILLARY
Glucose-Capillary: 152 mg/dL — ABNORMAL HIGH (ref 70–99)
Glucose-Capillary: 202 mg/dL — ABNORMAL HIGH (ref 70–99)
Glucose-Capillary: 186 mg/dL — ABNORMAL HIGH (ref 70–99)
Glucose-Capillary: 175 mg/dL — ABNORMAL HIGH (ref 70–99)

## 2020-12-20 NOTE — NC FL2 (Addendum)
[place] LEVEL OF CARE SCREENING TOOL     IDENTIFICATION  Patient Name: [name] Birthdate: 04-01-1966 Sex: female Admission Date (Current Location): 10/10/2020  [facility] and [place] Number:  Herbalist and Address:  [facility],  [address] [place], [place]      Provider Number: [id number]  Attending Physician Name and Address:  Shelly Coss, MD  Relative Name and Phone Number:  Deggie Jonell Cluck [phone]    Current Level of Care: Hospital Recommended Level of Care: [place] Prior Approval Number:    Date Approved/Denied:   PASRR Number: [id number]  Discharge Plan: SNF    Current Diagnoses: Patient Active Problem List   Diagnosis Date Noted   Cellulitis 10/17/2020   HTN (hypertension) 10/11/2020   Wound infection 10/10/2020   Hyperkalemia 10/10/2020   Chronic peripheral venous hypertension with lower extremity complication 33/29/5188   Lymphedema 12/14/2019   Obesity 12/14/2019   Type 2 diabetes mellitus ([place]) 12/14/2019   S/P hysterectomy 08/13/2011    Orientation RESPIRATION BLADDER Height & Weight     Self, Time, Situation, Place  Normal Incontinent Weight: (!) 454 lb 2.4 oz (206 kg) Height:  5\' 10"  (177.8 cm)  BEHAVIORAL SYMPTOMS/MOOD NEUROLOGICAL BOWEL NUTRITION STATUS      Continent Diet (carb modified)  AMBULATORY STATUS COMMUNICATION OF NEEDS Skin   Total Care Verbally PU Stage and Appropriate Care   PU Stage 2 Dressing: BID                   Personal Care Assistance Level of Assistance  Bathing, Feeding, Dressing Bathing Assistance: Maximum assistance Feeding assistance: Independent Dressing Assistance: Maximum assistance     Functional Limitations Info  Sight, Speech, Hearing Sight Info: Adequate Hearing Info: Adequate Speech Info: Adequate    SPECIAL CARE FACTORS FREQUENCY  PT (By licensed PT), OT (By licensed OT)     PT Frequency: 5x per week OT  Frequency: 5x per week            Contractures Contractures Info: Not present    Additional Factors Info  Code Status, Allergies Code Status Info: Full code Allergies Info: no none allergies           Current Medications (12/24/2020):  This is the current hospital active medication list Current Facility-Administered Medications  Medication Dose Route Frequency Provider Last Rate Last Admin   0.9 %  sodium chloride infusion   Intravenous PRN Georgette Shell, MD   Stopped at 11/15/20 1816   acetaminophen (TYLENOL) tablet 1,000 mg  1,000 mg Oral QHS Gala Romney L, MD   1,000 mg at 12/23/20 2153   acetaminophen (TYLENOL) tablet 650 mg  650 mg Oral Q6H PRN Raiford [place], DO   650 mg at 12/20/20 1523   bisacodyl (DULCOLAX) suppository 10 mg  10 mg Rectal Daily PRN Elodia [place]., MD       diclofenac Sodium (VOLTAREN) 1 % topical gel 2 g  2 g Topical QID PRN Eugenie Filler, MD   2 g at 12/09/20 1333   ferrous sulfate tablet 325 mg  325 mg Oral Daily Gala Romney L, MD   325 mg at 12/24/20 1113   hydrocortisone cream 1 %   Topical BID Alma Friendly, MD   Given at 12/24/20 1114   hydrOXYzine (ATARAX/VISTARIL) tablet 10 mg  10 mg Oral TID Georgette Shell, MD   10 mg at 12/24/20 1113   iohexol (OMNIPAQUE)  350 MG/ML injection 100 mL  100 mL Intravenous Once PRN Georgette Shell, MD       loratadine (CLARITIN) tablet 10 mg  10 mg Oral QPM Dwyane Dee, MD   10 mg at 12/23/20 1746   magnesium oxide (MAG-OX) tablet 800 mg  800 mg Oral BID Dwyane Dee, MD   800 mg at 12/24/20 1113   metFORMIN (GLUCOPHAGE-XR) 24 hr tablet 500 mg  500 mg Oral BID WC Sheikh, Georgina Quint [place], DO   500 mg at 12/24/20 1113   methocarbamol (ROBAXIN) tablet 1,000 mg  1,000 mg Oral Q6H PRN Aline August, MD   1,000 mg at 12/24/20 1113   metoprolol tartrate (LOPRESSOR) tablet 12.5 mg  12.5 mg Oral BID Georgette Shell, MD   12.5 mg at 12/24/20 1113   nutrition supplement  (JUVEN) (JUVEN) powder packet 1 packet  1 packet Oral BID BM Samuella Cota, MD   1 packet at 12/24/20 1112   ondansetron (ZOFRAN) tablet 4 mg  4 mg Oral Q6H PRN Elwyn Reach, MD       Or   ondansetron (ZOFRAN) injection 4 mg  4 mg Intravenous Q6H PRN Elwyn Reach, MD       oxyCODONE (Oxy IR/ROXICODONE) immediate release tablet 10 mg  10 mg Oral Q4H PRN Elwyn Reach, MD   10 mg at 12/24/20 1526   protein supplement (ENSURE MAX) liquid  11 oz Oral BID Aline August, MD   11 oz at 12/24/20 1114   rivaroxaban (XARELTO) tablet 10 mg  10 mg Oral Daily Dwyane Dee, MD   10 mg at 12/24/20 1113   senna (SENOKOT) tablet 8.6 mg  1 tablet Oral BID Samuella Cota, MD   8.6 mg at 12/24/20 1113     Discharge Medications: Please see discharge summary for a list of discharge medications.  Relevant Imaging Results:  Relevant Lab Results:   Additional Information [id number]  Lennart Pall, LCSW

## 2020-12-20 NOTE — Progress Notes (Signed)
PT Cancellation Note  Patient Details Name: [name] MRN: [id number] DOB: [birth year]   Cancelled Treatment:    Reason Eval/Treat Not Completed: Patient declined, no reason specified   [name] 12/20/2020, 2:40 PM  [place] Pager [phone] Office [phone]

## 2020-12-20 NOTE — Progress Notes (Signed)
PROGRESS NOTE    [name]  NID:[id number] DOB: 1966-03-25 DOA: 10/10/2020 PCP: Fanny Bien, MD   Chief Complain:Wound infection  Brief Narrative: Patient is a 55 year old female with PMH of diabetes mellitus type 2, essential hypertension, chronic lymphedema as well as other comorbidities who was admitted  skin breakdown and  was  infected wound on bilateral LE.  She is currently nonambulatory and lives alone and unable to care for self.  She has completed antibiotics and has been deemed medically stable to be discharged however disposition is difficult given her nonambulatory status and because SNF requires the patient to be under 450 pounds for her bariatric bed.TOC is following  Assessment & Plan:   Principal Problem:   Wound infection Active Problems:   Hyperkalemia   Chronic peripheral venous hypertension with lower extremity complication   Lymphedema   Obesity   Type 2 diabetes mellitus (HCC)   HTN (hypertension)   Cellulitis  Chronic bilateral lower extremity wounds/ lymphedema: -h/o chronic unhealing ulcers, she has chronic leg wound .She was being followed by wound care Dr. Quentin Cornwall since 2018 -On presentation patient noted to have increased pain, foul-smelling discharge from wounds -she was treated with broad spectrum abx -Currently the Wounds are pink and clean, no odor   Chronic Pain syndrome -C/w Oxycodone 10 mg q4hprn Breakthrough Pain, Diclofenac 2 gram topically 4 times daily PRN, Acetaminophen 1000 mg po qHS and Acetaminophen 650 mg po q6hprn -Added Ketorolac 10 mg po q8hprn Moderate/Severe Pain   Noninsulin-dependent type 2 diabetes Last A1c 6.5 She is currently on metformin and SSI   Hypomagnesemia On oral mag supplement, monitor   Hypertension Currently on metoprolol BP stable  Debility/deconditioning Due to morbid obesity.  She is hardly able to stand.  She has been sleeping in her lift chair since December last year.  She lives  alone.  Needs skilled nursing facility on DC  Disposition: Difficult placement given her nonambulatory status,likely custodial care  and because SNF requires the patient to be under 450 pounds for her bariatric bed.TOC is following  Super morbid obesity: BMI of 67.6  Pressure Injury 10/11/20 Thigh Left;Posterior (Active)  10/11/20 0041  Location: Thigh  Location Orientation: Left;Posterior  Staging:   Wound Description (Comments):   Present on Admission:      Pressure Injury 10/11/20 Tibial Left;Posterior;Proximal (Active)  10/11/20 0041  Location: Tibial  Location Orientation: Left;Posterior;Proximal  Staging:   Wound Description (Comments):   Present on Admission:             Nutrition Problem: Increased nutrient needs Etiology: wound healing      DVT prophylaxis:Xarelto Code Status: Full Family Communication: None at bedside Status is: Inpatient  Remains inpatient appropriate because:Unsafe d/c plan  Dispo: The patient is from: Home              Anticipated d/c is to: SNF              Patient currently is medically stable to d/c.   Difficult to place patient Yes   Consultants: None  Procedures:None  Antimicrobials:  Anti-infectives (From admission, onward)    Start     Dose/Rate Route Frequency Ordered Stop   10/11/20 1000  vancomycin (VANCOREADY) IVPB 2000 mg/400 mL  Status:  Discontinued        2,000 mg 200 mL/hr over 120 Minutes Intravenous Every 12 hours 10/10/20 2047 10/11/20 1140   10/11/20 0100  ceFEPIme (MAXIPIME) 2 g in sodium chloride 0.9 %  100 mL IVPB  Status:  Discontinued        2 g 200 mL/hr over 30 Minutes Intravenous Every 8 hours 10/10/20 2001 10/11/20 1140   10/10/20 2100  vancomycin (VANCOCIN) 2,500 mg in sodium chloride 0.9 % 500 mL IVPB        2,500 mg 250 mL/hr over 120 Minutes Intravenous  Once 10/10/20 2001 10/11/20 0135   10/10/20 1545  ceFAZolin (ANCEF) IVPB 2g/100 mL premix        2 g 200 mL/hr over 30 Minutes  Intravenous  Once 10/10/20 1530 10/10/20 1723       Subjective:  Patient seen at the bedside.  Comfortable, lying on bed.  No new complaints   Objective: Vitals:   12/19/20 1353 12/19/20 2154 12/20/20 0500 12/20/20 0530  BP: 115/74 126/76  (!) 143/87  Pulse: 93 98  95  Resp: 18 17  17   Temp: 98.2 F (36.8 C) 99.5 F (37.5 C)  97.9 F (36.6 C)  TempSrc: Oral Oral  Oral  SpO2: 96% 93%  (!) 89%  Weight:   (!) 211.6 kg   Height:        Intake/Output Summary (Last 24 hours) at 12/20/2020 1311 Last data filed at 12/20/2020 0600 Gross per 24 hour  Intake 720 ml  Output 650 ml  Net 70 ml   Filed Weights   12/18/20 0700 12/19/20 0523 12/20/20 0500  Weight: (!) 211.1 kg (!) 210.2 kg (!) 211.6 kg    Examination: General exam: Comfortable, lying on bed as usual   Data Reviewed: I have personally reviewed following labs and imaging studies  CBC: No results for input(s): WBC, NEUTROABS, HGB, HCT, MCV, PLT in the last 168 hours.  Basic Metabolic Panel: Recent Labs  Lab 12/14/20 0339 12/16/20 0259  NA 138 137  K 4.3 4.3  CL 103 101  CO2 26 26  GLUCOSE 161* 149*  BUN 23* 21*  CREATININE 0.70 0.55  CALCIUM 9.2 9.3  MG 1.7 1.6*  PHOS 5.5*  --    GFR: Estimated Creatinine Clearance: 163.2 mL/min (by C-G formula based on SCr of 0.55 mg/dL). Liver Function Tests: No results for input(s): AST, ALT, ALKPHOS, BILITOT, PROT, ALBUMIN in the last 168 hours.  No results for input(s): LIPASE, AMYLASE in the last 168 hours. No results for input(s): AMMONIA in the last 168 hours. Coagulation Profile: No results for input(s): INR, PROTIME in the last 168 hours. Cardiac Enzymes: No results for input(s): CKTOTAL, CKMB, CKMBINDEX, TROPONINI in the last 168 hours. BNP (last 3 results) No results for input(s): PROBNP in the last 8760 hours. HbA1C: No results for input(s): HGBA1C in the last 72 hours. CBG: Recent Labs  Lab 12/19/20 0744 12/19/20 1712 12/19/20 2155  12/20/20 0739 12/20/20 1124  GLUCAP 148* 150* 157* 152* 202*   Lipid Profile: No results for input(s): CHOL, HDL, LDLCALC, TRIG, CHOLHDL, LDLDIRECT in the last 72 hours. Thyroid Function Tests: No results for input(s): TSH, T4TOTAL, FREET4, T3FREE, THYROIDAB in the last 72 hours. Anemia Panel: No results for input(s): VITAMINB12, FOLATE, FERRITIN, TIBC, IRON, RETICCTPCT in the last 72 hours. Sepsis Labs: No results for input(s): PROCALCITON, LATICACIDVEN in the last 168 hours.  No results found for this or any previous visit (from the past 240 hour(s)).       Radiology Studies: No results found.      Scheduled Meds:  acetaminophen  1,000 mg Oral QHS   ferrous sulfate  325 mg Oral Daily   hydrocortisone  cream   Topical BID   hydrOXYzine  10 mg Oral TID   loratadine  10 mg Oral QPM   magnesium oxide  800 mg Oral BID   metFORMIN  500 mg Oral BID WC   metoprolol tartrate  12.5 mg Oral BID   nutrition supplement (JUVEN)  1 packet Oral BID BM   Ensure Max Protein  11 oz Oral BID   rivaroxaban  10 mg Oral Daily   senna  1 tablet Oral BID   Continuous Infusions:  sodium chloride Stopped (11/15/20 1816)     LOS: 64 days    Time spent:15 mins. More than 50% of that time was spent in counseling and/or coordination of care.      Shelly Coss, MD [facility] P7/06/2020, 1:11 PM

## 2020-12-21 LAB — GLUCOSE, CAPILLARY
Glucose-Capillary: 179 mg/dL — ABNORMAL HIGH (ref 70–99)
Glucose-Capillary: 120 mg/dL — ABNORMAL HIGH (ref 70–99)
Glucose-Capillary: 201 mg/dL — ABNORMAL HIGH (ref 70–99)
Glucose-Capillary: 153 mg/dL — ABNORMAL HIGH (ref 70–99)
Glucose-Capillary: 188 mg/dL — ABNORMAL HIGH (ref 70–99)

## 2020-12-21 NOTE — Plan of Care (Signed)
  Problem: Pain Managment: Goal: General experience of comfort will improve Outcome: Progressing   Problem: Coping: Goal: Level of anxiety will decrease Outcome: Progressing   Problem: Safety: Goal: Ability to remain free from injury will improve Outcome: Progressing   

## 2020-12-21 NOTE — Plan of Care (Signed)
  Problem: Coping: Goal: Level of anxiety will decrease Outcome: Progressing   Problem: Pain Managment: Goal: General experience of comfort will improve Outcome: Progressing   

## 2020-12-21 NOTE — Progress Notes (Signed)
PROGRESS NOTE    [name]  TTS:[id number] DOB: 07-22-1965 DOA: 10/10/2020 PCP: Fanny Bien, MD   Chief Complain:Wound infection  Brief Narrative: Patient is a 55 year old female with PMH of diabetes mellitus type 2, essential hypertension, chronic lymphedema as well as other comorbidities who was admitted  skin breakdown and  was  infected wound on bilateral LE.  She is currently nonambulatory and lives alone and unable to care for self.  She has completed antibiotics and has been deemed medically stable to be discharged however disposition is difficult given her nonambulatory status and because SNF requires the patient to be under 450 pounds for her bariatric bed.TOC is following.  Prolonged hospital stay  Assessment & Plan:   Principal Problem:   Wound infection Active Problems:   Hyperkalemia   Chronic peripheral venous hypertension with lower extremity complication   Lymphedema   Obesity   Type 2 diabetes mellitus (HCC)   HTN (hypertension)   Cellulitis  Chronic bilateral lower extremity wounds/ lymphedema: -h/o chronic unhealing ulcers, she has chronic leg wound .She was being followed by wound care Dr. Quentin Cornwall since 2018 -On presentation patient noted to have increased pain, foul-smelling discharge from wounds -she was treated with broad spectrum abx -Currently the Wounds are pink and clean, no odor   Chronic Pain syndrome -C/w Oxycodone 10 mg q4hprn Breakthrough Pain, Diclofenac 2 gram topically 4 times daily PRN, Acetaminophen 1000 mg po qHS and Acetaminophen 650 mg po q6hprn -Added Ketorolac 10 mg po q8hprn Moderate/Severe Pain   Noninsulin-dependent type 2 diabetes Last A1c 6.5 She is currently on metformin and SSI   Hypomagnesemia On oral mag supplement, monitored intermittently   Hypertension Currently on metoprolol BP stable  Debility/deconditioning Due to morbid obesity.  She is hardly able to stand.  She has been sleeping in her lift  chair since December last year.  She lives alone.  Needs skilled nursing facility on DC  Disposition: Difficult placement given her nonambulatory status,likely custodial care  and because SNF requires the patient to be under 450 pounds for her bariatric bed.TOC is following  Super morbid obesity: BMI of 67.6  Pressure Injury 10/11/20 Thigh Left;Posterior (Active)  10/11/20 0041  Location: Thigh  Location Orientation: Left;Posterior  Staging:   Wound Description (Comments):   Present on Admission:      Pressure Injury 10/11/20 Tibial Left;Posterior;Proximal (Active)  10/11/20 0041  Location: Tibial  Location Orientation: Left;Posterior;Proximal  Staging:   Wound Description (Comments):   Present on Admission:             Nutrition Problem: Increased nutrient needs Etiology: wound healing      DVT prophylaxis:Xarelto Code Status: Full Family Communication: None at bedside Status is: Inpatient  Remains inpatient appropriate because:Unsafe d/c plan  Dispo: The patient is from: Home              Anticipated d/c is to: SNF              Patient currently is medically stable to d/c.   Difficult to place patient Yes   Consultants: None  Procedures:None  Antimicrobials:  Anti-infectives (From admission, onward)    Start     Dose/Rate Route Frequency Ordered Stop   10/11/20 1000  vancomycin (VANCOREADY) IVPB 2000 mg/400 mL  Status:  Discontinued        2,000 mg 200 mL/hr over 120 Minutes Intravenous Every 12 hours 10/10/20 2047 10/11/20 1140   10/11/20 0100  ceFEPIme (MAXIPIME) 2 g  in sodium chloride 0.9 % 100 mL IVPB  Status:  Discontinued        2 g 200 mL/hr over 30 Minutes Intravenous Every 8 hours 10/10/20 2001 10/11/20 1140   10/10/20 2100  vancomycin (VANCOCIN) 2,500 mg in sodium chloride 0.9 % 500 mL IVPB        2,500 mg 250 mL/hr over 120 Minutes Intravenous  Once 10/10/20 2001 10/11/20 0135   10/10/20 1545  ceFAZolin (ANCEF) IVPB 2g/100 mL premix         2 g 200 mL/hr over 30 Minutes Intravenous  Once 10/10/20 1530 10/10/20 1723       Subjective:  Patient seen at the bedside.  Lying on bed as always.  Appears comfortable.  No new complaints   Objective: Vitals:   12/20/20 2150 12/21/20 0001 12/21/20 0500 12/21/20 0535  BP: 126/61   (!) 144/84  Pulse: (!) 102   (!) 101  Resp: 16   16  Temp: 98.5 F (36.9 C)   98.4 F (36.9 C)  TempSrc: Oral   Oral  SpO2: 97%   90%  Weight:  (!) 206.5 kg (!) 206.1 kg   Height:        Intake/Output Summary (Last 24 hours) at 12/21/2020 1127 Last data filed at 12/21/2020 0754 Gross per 24 hour  Intake 480 ml  Output 1600 ml  Net -1120 ml   Filed Weights   12/20/20 0500 12/21/20 0001 12/21/20 0500  Weight: (!) 211.6 kg (!) 206.5 kg (!) 206.1 kg    Examination: General exam: Comfortable, morbidly obese, lying on bed   Data Reviewed: I have personally reviewed following labs and imaging studies  CBC: No results for input(s): WBC, NEUTROABS, HGB, HCT, MCV, PLT in the last 168 hours.  Basic Metabolic Panel: Recent Labs  Lab 12/16/20 0259  NA 137  K 4.3  CL 101  CO2 26  GLUCOSE 149*  BUN 21*  CREATININE 0.55  CALCIUM 9.3  MG 1.6*   GFR: Estimated Creatinine Clearance: 160.4 mL/min (by C-G formula based on SCr of 0.55 mg/dL). Liver Function Tests: No results for input(s): AST, ALT, ALKPHOS, BILITOT, PROT, ALBUMIN in the last 168 hours.  No results for input(s): LIPASE, AMYLASE in the last 168 hours. No results for input(s): AMMONIA in the last 168 hours. Coagulation Profile: No results for input(s): INR, PROTIME in the last 168 hours. Cardiac Enzymes: No results for input(s): CKTOTAL, CKMB, CKMBINDEX, TROPONINI in the last 168 hours. BNP (last 3 results) No results for input(s): PROBNP in the last 8760 hours. HbA1C: No results for input(s): HGBA1C in the last 72 hours. CBG: Recent Labs  Lab 12/20/20 0739 12/20/20 1124 12/20/20 1617 12/20/20 2148 12/21/20 0750   GLUCAP 152* 202* 186* 175* 179*   Lipid Profile: No results for input(s): CHOL, HDL, LDLCALC, TRIG, CHOLHDL, LDLDIRECT in the last 72 hours. Thyroid Function Tests: No results for input(s): TSH, T4TOTAL, FREET4, T3FREE, THYROIDAB in the last 72 hours. Anemia Panel: No results for input(s): VITAMINB12, FOLATE, FERRITIN, TIBC, IRON, RETICCTPCT in the last 72 hours. Sepsis Labs: No results for input(s): PROCALCITON, LATICACIDVEN in the last 168 hours.  No results found for this or any previous visit (from the past 240 hour(s)).       Radiology Studies: No results found.      Scheduled Meds:  acetaminophen  1,000 mg Oral QHS   ferrous sulfate  325 mg Oral Daily   hydrocortisone cream   Topical BID   hydrOXYzine  10 mg Oral TID   loratadine  10 mg Oral QPM   magnesium oxide  800 mg Oral BID   metFORMIN  500 mg Oral BID WC   metoprolol tartrate  12.5 mg Oral BID   nutrition supplement (JUVEN)  1 packet Oral BID BM   Ensure Max Protein  11 oz Oral BID   rivaroxaban  10 mg Oral Daily   senna  1 tablet Oral BID   Continuous Infusions:  sodium chloride Stopped (11/15/20 1816)     LOS: 65 days    Time spent:15 mins. More than 50% of that time was spent in counseling and/or coordination of care.      Shelly Coss, MD [facility] P7/07/2020, 11:27 AM

## 2020-12-21 NOTE — Plan of Care (Signed)
  Problem: Education: Goal: Knowledge of General Education information will improve Description: Including pain rating scale, medication(s)/side effects and non-pharmacologic comfort measures Outcome: Progressing   Problem: Skin Integrity: Goal: Risk for impaired skin integrity will decrease Outcome: Progressing   Problem: Pain Managment: Goal: General experience of comfort will improve Outcome: Progressing   

## 2020-12-22 LAB — GLUCOSE, CAPILLARY
Glucose-Capillary: 176 mg/dL — ABNORMAL HIGH (ref 70–99)
Glucose-Capillary: 165 mg/dL — ABNORMAL HIGH (ref 70–99)
Glucose-Capillary: 137 mg/dL — ABNORMAL HIGH (ref 70–99)
Glucose-Capillary: 154 mg/dL — ABNORMAL HIGH (ref 70–99)

## 2020-12-22 LAB — MAGNESIUM: Magnesium: 1.7 mg/dL (ref 1.7–2.4)

## 2020-12-22 NOTE — Plan of Care (Signed)
  Problem: Pain Managment: Goal: General experience of comfort will improve Outcome: Progressing   Problem: Coping: Goal: Level of anxiety will decrease Outcome: Progressing   

## 2020-12-22 NOTE — Progress Notes (Signed)
PROGRESS NOTE    [name]  UQJ:[id number] DOB: 01/01/1966 DOA: 10/10/2020 PCP: Fanny Bien, MD   Chief Complain:Wound infection  Brief Narrative: Patient is a 55 year old female with PMH of diabetes mellitus type 2, essential hypertension, chronic lymphedema as well as other comorbidities who was admitted  skin breakdown and  was  infected wound on bilateral LE.  She is currently nonambulatory and lives alone and unable to care for self.  She has completed antibiotics and has been deemed medically stable to be discharged however disposition is difficult given her nonambulatory status and because SNF requires the patient to be under 450 pounds for her bariatric bed.TOC is following.  Prolonged hospital stay  Assessment & Plan:   Principal Problem:   Wound infection Active Problems:   Hyperkalemia   Chronic peripheral venous hypertension with lower extremity complication   Lymphedema   Obesity   Type 2 diabetes mellitus (HCC)   HTN (hypertension)   Cellulitis  Chronic bilateral lower extremity wounds/ lymphedema: -h/o chronic unhealing ulcers, she has chronic leg wound .She was being followed by wound care Dr. Quentin Cornwall since 2018 -On presentation patient noted to have increased pain, foul-smelling discharge from wounds -she was treated with broad spectrum abx -Currently the Wounds are pink and clean, no odor   Chronic Pain syndrome -C/w Oxycodone 10 mg q4hprn Breakthrough Pain, Diclofenac 2 gram topically 4 times daily PRN, Acetaminophen 1000 mg po qHS and Acetaminophen 650 mg po q6hprn -Added Ketorolac 10 mg po q8hprn Moderate/Severe Pain   Noninsulin-dependent type 2 diabetes Last A1c 6.5 She is currently on metformin and SSI   Hypomagnesemia On oral mag supplement, monitored intermittently   Hypertension Currently on metoprolol BP stable  Debility/deconditioning Due to morbid obesity.  She is hardly able to stand.  She has been sleeping in her lift  chair since December last year.  She lives alone.  Needs skilled nursing facility on DC  Disposition: Difficult placement given her nonambulatory status,likely custodial care  and because SNF requires the patient to be under 450 pounds for her bariatric bed.TOC is following  Super morbid obesity: BMI of 67.6  Pressure Injury 10/11/20 Thigh Left;Posterior (Active)  10/11/20 0041  Location: Thigh  Location Orientation: Left;Posterior  Staging:   Wound Description (Comments):   Present on Admission:      Pressure Injury 10/11/20 Tibial Left;Posterior;Proximal (Active)  10/11/20 0041  Location: Tibial  Location Orientation: Left;Posterior;Proximal  Staging:   Wound Description (Comments):   Present on Admission:             Nutrition Problem: Increased nutrient needs Etiology: wound healing      DVT prophylaxis:Xarelto Code Status: Full Family Communication: None at bedside Status is: Inpatient  Remains inpatient appropriate because:Unsafe d/c plan  Dispo: The patient is from: Home              Anticipated d/c is to: SNF              Patient currently is medically stable to d/c.   Difficult to place patient Yes   Consultants: None  Procedures:None  Antimicrobials:  Anti-infectives (From admission, onward)    Start     Dose/Rate Route Frequency Ordered Stop   10/11/20 1000  vancomycin (VANCOREADY) IVPB 2000 mg/400 mL  Status:  Discontinued        2,000 mg 200 mL/hr over 120 Minutes Intravenous Every 12 hours 10/10/20 2047 10/11/20 1140   10/11/20 0100  ceFEPIme (MAXIPIME) 2 g  in sodium chloride 0.9 % 100 mL IVPB  Status:  Discontinued        2 g 200 mL/hr over 30 Minutes Intravenous Every 8 hours 10/10/20 2001 10/11/20 1140   10/10/20 2100  vancomycin (VANCOCIN) 2,500 mg in sodium chloride 0.9 % 500 mL IVPB        2,500 mg 250 mL/hr over 120 Minutes Intravenous  Once 10/10/20 2001 10/11/20 0135   10/10/20 1545  ceFAZolin (ANCEF) IVPB 2g/100 mL premix         2 g 200 mL/hr over 30 Minutes Intravenous  Once 10/10/20 1530 10/10/20 1723       Subjective:  Patient seen at the bedside this morning.  Hemodynamically stable.  She was playing with her phone.  No new complaints today.  Lying on bed as always.  Objective: Vitals:   12/21/20 1422 12/21/20 2147 12/22/20 0500 12/22/20 0538  BP: 124/73 122/76  (!) 149/84  Pulse: 95 98  95  Resp: 18 16  17   Temp: 98.2 F (36.8 C) 99.3 F (37.4 C)  97.9 F (36.6 C)  TempSrc: Oral Oral  Oral  SpO2: 92% 92%  93%  Weight:   (!) 208.5 kg   Height:        Intake/Output Summary (Last 24 hours) at 12/22/2020 1319 Last data filed at 12/22/2020 1200 Gross per 24 hour  Intake 600 ml  Output 1150 ml  Net -550 ml   Filed Weights   12/21/20 0001 12/21/20 0500 12/22/20 0500  Weight: (!) 206.5 kg (!) 206.1 kg (!) 208.5 kg    Examination: General exam: Comfortable, lying on bed.  Data Reviewed: I have personally reviewed following labs and imaging studies  CBC: No results for input(s): WBC, NEUTROABS, HGB, HCT, MCV, PLT in the last 168 hours.  Basic Metabolic Panel: Recent Labs  Lab 12/16/20 0259 12/22/20 0249  NA 137  --   K 4.3  --   CL 101  --   CO2 26  --   GLUCOSE 149*  --   BUN 21*  --   CREATININE 0.55  --   CALCIUM 9.3  --   MG 1.6* 1.7   GFR: Estimated Creatinine Clearance: 161.7 mL/min (by C-G formula based on SCr of 0.55 mg/dL). Liver Function Tests: No results for input(s): AST, ALT, ALKPHOS, BILITOT, PROT, ALBUMIN in the last 168 hours.  No results for input(s): LIPASE, AMYLASE in the last 168 hours. No results for input(s): AMMONIA in the last 168 hours. Coagulation Profile: No results for input(s): INR, PROTIME in the last 168 hours. Cardiac Enzymes: No results for input(s): CKTOTAL, CKMB, CKMBINDEX, TROPONINI in the last 168 hours. BNP (last 3 results) No results for input(s): PROBNP in the last 8760 hours. HbA1C: No results for input(s): HGBA1C in the last 72  hours. CBG: Recent Labs  Lab 12/21/20 1156 12/21/20 1626 12/21/20 2144 12/22/20 0735 12/22/20 1151  GLUCAP 201* 153* 188* 176* 165*   Lipid Profile: No results for input(s): CHOL, HDL, LDLCALC, TRIG, CHOLHDL, LDLDIRECT in the last 72 hours. Thyroid Function Tests: No results for input(s): TSH, T4TOTAL, FREET4, T3FREE, THYROIDAB in the last 72 hours. Anemia Panel: No results for input(s): VITAMINB12, FOLATE, FERRITIN, TIBC, IRON, RETICCTPCT in the last 72 hours. Sepsis Labs: No results for input(s): PROCALCITON, LATICACIDVEN in the last 168 hours.  No results found for this or any previous visit (from the past 240 hour(s)).       Radiology Studies: No results found.  Scheduled Meds:  acetaminophen  1,000 mg Oral QHS   ferrous sulfate  325 mg Oral Daily   hydrocortisone cream   Topical BID   hydrOXYzine  10 mg Oral TID   loratadine  10 mg Oral QPM   magnesium oxide  800 mg Oral BID   metFORMIN  500 mg Oral BID WC   metoprolol tartrate  12.5 mg Oral BID   nutrition supplement (JUVEN)  1 packet Oral BID BM   Ensure Max Protein  11 oz Oral BID   rivaroxaban  10 mg Oral Daily   senna  1 tablet Oral BID   Continuous Infusions:  sodium chloride Stopped (11/15/20 1816)     LOS: 66 days    Time spent:15 mins. More than 50% of that time was spent in counseling and/or coordination of care.      Shelly Coss, MD [facility] P7/08/2020, 1:19 PM

## 2020-12-22 NOTE — Plan of Care (Signed)
  Problem: Education: Goal: Knowledge of General Education information will improve Description: Including pain rating scale, medication(s)/side effects and non-pharmacologic comfort measures Outcome: Progressing   Problem: Skin Integrity: Goal: Risk for impaired skin integrity will decrease Outcome: Progressing   Problem: Pain Managment: Goal: General experience of comfort will improve Outcome: Progressing   

## 2020-12-22 NOTE — Progress Notes (Signed)
TOC CSW noted that Dr. Tamsen Meek noted that pt was unsafe to dc, due to over the required weight for SNF placement.  TOC will continue to follow.  Retia Cordle Tarpley-Carter, MSW, LCSW-A Pronouns:  She, Her, Hers                  [facility] Transitions of CareClinical Social Worker [email or web address] [phone]

## 2020-12-23 LAB — GLUCOSE, CAPILLARY
Glucose-Capillary: 186 mg/dL — ABNORMAL HIGH (ref 70–99)
Glucose-Capillary: 199 mg/dL — ABNORMAL HIGH (ref 70–99)
Glucose-Capillary: 139 mg/dL — ABNORMAL HIGH (ref 70–99)
Glucose-Capillary: 154 mg/dL — ABNORMAL HIGH (ref 70–99)

## 2020-12-23 NOTE — Progress Notes (Signed)
PROGRESS NOTE    [name]  WNI:[id number] DOB: [birth year] DOA: 10/10/2020 PCP: Fanny Bien, MD   Chief Complain:Wound infection  Brief Narrative: Patient is a 55 year old female with PMH of diabetes mellitus type 2, essential hypertension, chronic lymphedema as well as other comorbidities who was admitted  skin breakdown and  was  infected wound on bilateral LE.  She is currently nonambulatory and lives alone and unable to care for self.  She has completed antibiotics and has been deemed medically stable to be discharged however disposition is difficult given her nonambulatory status and because SNF requires the patient to be under 450 pounds for her bariatric bed.TOC is following.  Prolonged hospital stay  Assessment & Plan:   Principal Problem:   Wound infection Active Problems:   Hyperkalemia   Chronic peripheral venous hypertension with lower extremity complication   Lymphedema   Obesity   Type 2 diabetes mellitus (HCC)   HTN (hypertension)   Cellulitis  Chronic bilateral lower extremity wounds/ lymphedema: -h/o chronic unhealing ulcers, she has chronic leg wound .She was being followed by wound care Dr. Quentin Cornwall since 2018 -On presentation patient noted to have increased pain, foul-smelling discharge from wounds -she was treated with broad spectrum abx -Currently the Wounds are pink and clean, no odor   Chronic Pain syndrome -C/w Oxycodone 10 mg q4hprn Breakthrough Pain, Diclofenac 2 gram topically 4 times daily PRN, Acetaminophen 1000 mg po qHS and Acetaminophen 650 mg po q6hprn -Added Ketorolac 10 mg po q8hprn Moderate/Severe Pain   Noninsulin-dependent type 2 diabetes Last A1c 6.5 She is currently on metformin and SSI   Hypomagnesemia On oral mag supplement, monitored intermittently   Hypertension Currently on metoprolol BP stable  Debility/deconditioning Due to morbid obesity.  She is hardly able to stand.  She has been sleeping in her lift  chair since December last year.  She lives alone.  Needs skilled nursing facility on DC  Disposition: Difficult placement given her nonambulatory status,likely custodial care  and because SNF requires the patient to be under 450 pounds for her bariatric bed.TOC is following  Super morbid obesity: BMI of 67.6  Pressure Injury 10/11/20 Thigh Left;Posterior (Active)  10/11/20 0041  Location: Thigh  Location Orientation: Left;Posterior  Staging:   Wound Description (Comments):   Present on Admission:      Pressure Injury 10/11/20 Tibial Left;Posterior;Proximal (Active)  10/11/20 0041  Location: Tibial  Location Orientation: Left;Posterior;Proximal  Staging:   Wound Description (Comments):   Present on Admission:             Nutrition Problem: Increased nutrient needs Etiology: wound healing      DVT prophylaxis:Xarelto Code Status: Full Family Communication: None at bedside Status is: Inpatient  Remains inpatient appropriate because:Unsafe d/c plan  Dispo: The patient is from: Home              Anticipated d/c is to: SNF              Patient currently is medically stable to d/c.   Difficult to place patient Yes   Consultants: None  Procedures:None  Antimicrobials:  Anti-infectives (From admission, onward)    Start     Dose/Rate Route Frequency Ordered Stop   10/11/20 1000  vancomycin (VANCOREADY) IVPB 2000 mg/400 mL  Status:  Discontinued        2,000 mg 200 mL/hr over 120 Minutes Intravenous Every 12 hours 10/10/20 2047 10/11/20 1140   10/11/20 0100  ceFEPIme (MAXIPIME) 2 g  in sodium chloride 0.9 % 100 mL IVPB  Status:  Discontinued        2 g 200 mL/hr over 30 Minutes Intravenous Every 8 hours 10/10/20 2001 10/11/20 1140   10/10/20 2100  vancomycin (VANCOCIN) 2,500 mg in sodium chloride 0.9 % 500 mL IVPB        2,500 mg 250 mL/hr over 120 Minutes Intravenous  Once 10/10/20 2001 10/11/20 0135   10/10/20 1545  ceFAZolin (ANCEF) IVPB 2g/100 mL premix         2 g 200 mL/hr over 30 Minutes Intravenous  Once 10/10/20 1530 10/10/20 1723       Subjective:  Patient seen at the bedside.  Found to be comfortable, lying on bed.  Playing with her phone.  Objective: Vitals:   12/22/20 1352 12/22/20 2145 12/23/20 0500 12/23/20 0504  BP: (!) 142/84 126/74  140/79  Pulse: 91 95  97  Resp: 18 17  17   Temp: 98.2 F (36.8 C) 98.1 F (36.7 C)  98.1 F (36.7 C)  TempSrc: Oral Oral  Oral  SpO2: 92% 92%  90%  Weight:   (!) 205.4 kg   Height:        Intake/Output Summary (Last 24 hours) at 12/23/2020 1147 Last data filed at 12/23/2020 1050 Gross per 24 hour  Intake 1440 ml  Output 2000 ml  Net -560 ml   Filed Weights   12/21/20 0500 12/22/20 0500 12/23/20 0500  Weight: (!) 206.1 kg (!) 208.5 kg (!) 205.4 kg    Examination: General exam: Comfortable,lying on bed.  Data Reviewed: I have personally reviewed following labs and imaging studies  CBC: No results for input(s): WBC, NEUTROABS, HGB, HCT, MCV, PLT in the last 168 hours.  Basic Metabolic Panel: Recent Labs  Lab 12/22/20 0249  MG 1.7   GFR: Estimated Creatinine Clearance: 160.1 mL/min (by C-G formula based on SCr of 0.55 mg/dL). Liver Function Tests: No results for input(s): AST, ALT, ALKPHOS, BILITOT, PROT, ALBUMIN in the last 168 hours.  No results for input(s): LIPASE, AMYLASE in the last 168 hours. No results for input(s): AMMONIA in the last 168 hours. Coagulation Profile: No results for input(s): INR, PROTIME in the last 168 hours. Cardiac Enzymes: No results for input(s): CKTOTAL, CKMB, CKMBINDEX, TROPONINI in the last 168 hours. BNP (last 3 results) No results for input(s): PROBNP in the last 8760 hours. HbA1C: No results for input(s): HGBA1C in the last 72 hours. CBG: Recent Labs  Lab 12/22/20 0735 12/22/20 1151 12/22/20 1652 12/22/20 2141 12/23/20 0733  GLUCAP 176* 165* 137* 154* 186*   Lipid Profile: No results for input(s): CHOL, HDL, LDLCALC, TRIG,  CHOLHDL, LDLDIRECT in the last 72 hours. Thyroid Function Tests: No results for input(s): TSH, T4TOTAL, FREET4, T3FREE, THYROIDAB in the last 72 hours. Anemia Panel: No results for input(s): VITAMINB12, FOLATE, FERRITIN, TIBC, IRON, RETICCTPCT in the last 72 hours. Sepsis Labs: No results for input(s): PROCALCITON, LATICACIDVEN in the last 168 hours.  No results found for this or any previous visit (from the past 240 hour(s)).       Radiology Studies: No results found.      Scheduled Meds:  acetaminophen  1,000 mg Oral QHS   ferrous sulfate  325 mg Oral Daily   hydrocortisone cream   Topical BID   hydrOXYzine  10 mg Oral TID   loratadine  10 mg Oral QPM   magnesium oxide  800 mg Oral BID   metFORMIN  500 mg Oral BID  WC   metoprolol tartrate  12.5 mg Oral BID   nutrition supplement (JUVEN)  1 packet Oral BID BM   Ensure Max Protein  11 oz Oral BID   rivaroxaban  10 mg Oral Daily   senna  1 tablet Oral BID   Continuous Infusions:  sodium chloride Stopped (11/15/20 1816)     LOS: 67 days    Time spent:15 mins. More than 50% of that time was spent in counseling and/or coordination of care.      Shelly Coss, MD [facility] P7/09/2020, 11:47 AM

## 2020-12-23 NOTE — Plan of Care (Signed)
  Problem: Pain Managment: Goal: General experience of comfort will improve 12/23/2020 0344 by Blase Mess, RN Outcome: Progressing 12/22/2020 2336 by Blase Mess, RN Outcome: Progressing

## 2020-12-24 LAB — GLUCOSE, CAPILLARY
Glucose-Capillary: 146 mg/dL — ABNORMAL HIGH (ref 70–99)
Glucose-Capillary: 198 mg/dL — ABNORMAL HIGH (ref 70–99)
Glucose-Capillary: 148 mg/dL — ABNORMAL HIGH (ref 70–99)
Glucose-Capillary: 160 mg/dL — ABNORMAL HIGH (ref 70–99)

## 2020-12-24 NOTE — Progress Notes (Signed)
PROGRESS NOTE    [name]  VWU:[id number] DOB: 05/06/1966 DOA: 10/10/2020 PCP: Fanny Bien, MD   Chief Complain:Wound infection  Brief Narrative: Patient is a 55 year old female with PMH of diabetes mellitus type 2, essential hypertension, chronic lymphedema as well as other comorbidities who was admitted  skin breakdown and  was  infected wound on bilateral LE.  She is currently nonambulatory and lives alone and unable to care for self.  She has completed antibiotics and has been deemed medically stable to be discharged however disposition is difficult given her nonambulatory status and because SNF requires the patient to be under 450 pounds for her bariatric bed.TOC is following.  Prolonged hospital stay  Assessment & Plan:   Principal Problem:   Wound infection Active Problems:   Hyperkalemia   Chronic peripheral venous hypertension with lower extremity complication   Lymphedema   Obesity   Type 2 diabetes mellitus (HCC)   HTN (hypertension)   Cellulitis  Chronic bilateral lower extremity wounds/ lymphedema: -h/o chronic unhealing ulcers, she has chronic leg wound .She was being followed by wound care Dr. Quentin Cornwall since 2018 -On presentation patient noted to have increased pain, foul-smelling discharge from wounds -she was treated with broad spectrum abx -Currently the Wounds are pink and clean, no odor   Chronic Pain syndrome -C/w Oxycodone 10 mg q4hprn Breakthrough Pain, Diclofenac 2 gram topically 4 times daily PRN, Acetaminophen 1000 mg po qHS and Acetaminophen 650 mg po q6hprn   Noninsulin-dependent type 2 diabetes Last A1c 6.5 She is currently on metformin and SSI   Hypomagnesemia On oral mag supplement, monitored intermittently   Hypertension Currently on metoprolol BP stable  Debility/deconditioning Due to morbid obesity.  She is hardly able to stand.  She had been sleeping in her lift chair since December last year.  She lives alone.  Needs  skilled nursing facility on DC  Disposition: Difficult placement given her nonambulatory status,likely custodial care  and because SNF requires the patient to be under 450 pounds for her bariatric bed.TOC is following  Super morbid obesity: BMI of 67.6  Pressure Injury 10/11/20 Thigh Left;Posterior (Active)  10/11/20 0041  Location: Thigh  Location Orientation: Left;Posterior  Staging:   Wound Description (Comments):   Present on Admission:      Pressure Injury 10/11/20 Tibial Left;Posterior;Proximal (Active)  10/11/20 0041  Location: Tibial  Location Orientation: Left;Posterior;Proximal  Staging:   Wound Description (Comments):   Present on Admission:             Nutrition Problem: Increased nutrient needs Etiology: wound healing      DVT prophylaxis:Xarelto Code Status: Full Family Communication: None at bedside Status is: Inpatient  Remains inpatient appropriate because:Unsafe d/c plan  Dispo: The patient is from: Home              Anticipated d/c is to: SNF              Patient currently is medically stable to d/c.   Difficult to place patient Yes   Consultants: None  Procedures:None  Antimicrobials:  Anti-infectives (From admission, onward)    Start     Dose/Rate Route Frequency Ordered Stop   10/11/20 1000  vancomycin (VANCOREADY) IVPB 2000 mg/400 mL  Status:  Discontinued        2,000 mg 200 mL/hr over 120 Minutes Intravenous Every 12 hours 10/10/20 2047 10/11/20 1140   10/11/20 0100  ceFEPIme (MAXIPIME) 2 g in sodium chloride 0.9 % 100 mL IVPB  Status:  Discontinued        2 g 200 mL/hr over 30 Minutes Intravenous Every 8 hours 10/10/20 2001 10/11/20 1140   10/10/20 2100  vancomycin (VANCOCIN) 2,500 mg in sodium chloride 0.9 % 500 mL IVPB        2,500 mg 250 mL/hr over 120 Minutes Intravenous  Once 10/10/20 2001 10/11/20 0135   10/10/20 1545  ceFAZolin (ANCEF) IVPB 2g/100 mL premix        2 g 200 mL/hr over 30 Minutes Intravenous  Once  10/10/20 1530 10/10/20 1723       Subjective:  Patient seen and examined the bedside this morning.  Hemodynamically stable.  She was lying on her bed as usual.  Denies any complaints Objective: Vitals:   12/23/20 1332 12/23/20 2127 12/24/20 0540 12/24/20 0541  BP: 133/85 123/70  115/67  Pulse: 92 (!) 101  92  Resp: 16 16  16   Temp: 98.2 F (36.8 C) 98.8 F (37.1 C)  98.3 F (36.8 C)  TempSrc:  Oral  Oral  SpO2: 96% 93%  96%  Weight:   (!) 206 kg   Height:        Intake/Output Summary (Last 24 hours) at 12/24/2020 1113 Last data filed at 12/24/2020 0600 Gross per 24 hour  Intake 1380 ml  Output 1100 ml  Net 280 ml   Filed Weights   12/22/20 0500 12/23/20 0500 12/24/20 0540  Weight: (!) 208.5 kg (!) 205.4 kg (!) 206 kg    Examination: General exam: Seen at bedside.  Comfortable, no complaints, playing with phone  Data Reviewed: I have personally reviewed following labs and imaging studies  CBC: No results for input(s): WBC, NEUTROABS, HGB, HCT, MCV, PLT in the last 168 hours.  Basic Metabolic Panel: Recent Labs  Lab 12/22/20 0249  MG 1.7   GFR: Estimated Creatinine Clearance: 160.4 mL/min (by C-G formula based on SCr of 0.55 mg/dL). Liver Function Tests: No results for input(s): AST, ALT, ALKPHOS, BILITOT, PROT, ALBUMIN in the last 168 hours.  No results for input(s): LIPASE, AMYLASE in the last 168 hours. No results for input(s): AMMONIA in the last 168 hours. Coagulation Profile: No results for input(s): INR, PROTIME in the last 168 hours. Cardiac Enzymes: No results for input(s): CKTOTAL, CKMB, CKMBINDEX, TROPONINI in the last 168 hours. BNP (last 3 results) No results for input(s): PROBNP in the last 8760 hours. HbA1C: No results for input(s): HGBA1C in the last 72 hours. CBG: Recent Labs  Lab 12/23/20 0733 12/23/20 1151 12/23/20 1658 12/23/20 2129 12/24/20 0747  GLUCAP 186* 199* 139* 154* 146*   Lipid Profile: No results for input(s): CHOL,  HDL, LDLCALC, TRIG, CHOLHDL, LDLDIRECT in the last 72 hours. Thyroid Function Tests: No results for input(s): TSH, T4TOTAL, FREET4, T3FREE, THYROIDAB in the last 72 hours. Anemia Panel: No results for input(s): VITAMINB12, FOLATE, FERRITIN, TIBC, IRON, RETICCTPCT in the last 72 hours. Sepsis Labs: No results for input(s): PROCALCITON, LATICACIDVEN in the last 168 hours.  No results found for this or any previous visit (from the past 240 hour(s)).       Radiology Studies: No results found.      Scheduled Meds:  acetaminophen  1,000 mg Oral QHS   ferrous sulfate  325 mg Oral Daily   hydrocortisone cream   Topical BID   hydrOXYzine  10 mg Oral TID   loratadine  10 mg Oral QPM   magnesium oxide  800 mg Oral BID   metFORMIN  500  mg Oral BID WC   metoprolol tartrate  12.5 mg Oral BID   nutrition supplement (JUVEN)  1 packet Oral BID BM   Ensure Max Protein  11 oz Oral BID   rivaroxaban  10 mg Oral Daily   senna  1 tablet Oral BID   Continuous Infusions:  sodium chloride Stopped (11/15/20 1816)     LOS: 68 days    Time spent:15 mins. More than 50% of that time was spent in counseling and/or coordination of care.      Shelly Coss, MD [facility] P7/10/2020, 11:13 AM

## 2020-12-25 LAB — GLUCOSE, CAPILLARY
Glucose-Capillary: 175 mg/dL — ABNORMAL HIGH (ref 70–99)
Glucose-Capillary: 169 mg/dL — ABNORMAL HIGH (ref 70–99)
Glucose-Capillary: 160 mg/dL — ABNORMAL HIGH (ref 70–99)
Glucose-Capillary: 166 mg/dL — ABNORMAL HIGH (ref 70–99)

## 2020-12-25 NOTE — TOC Progression Note (Signed)
Transition of Care [facility]) - Progression Note    Patient Details  Name: [name] MRN: [id number] Date of Birth: Nov 29, 1965  [facility] CM/SW Contact  Lennart Pall, LCSW Phone Number: 12/25/2020, 3:41 PM  Clinical Narrative:    Have received another bed offer today from [facility] who has secured needed DME for pt and can admit her tomorrow.  I have started insurance authorization (ref# [id number]).  Have informed pt who is still apprehensive about the facility, however, understands that we have no other choices to offer.  Have informed MD and RN and requested another COVID test.     Expected Discharge Plan: Skilled Nursing Facility Barriers to Discharge: Ship broker  Expected Discharge Plan and Services Expected Discharge Plan: [place] In-house Referral: Clinical Social Work Discharge Planning Services: CM Consult Post Acute Care Choice: [place] Living arrangements for the past 2 months: Apartment                                       Social Determinants of Health (SDOH) Interventions    Readmission Risk Interventions No flowsheet data found.

## 2020-12-25 NOTE — Progress Notes (Signed)
PROGRESS NOTE    [name]  KCM:[id number] DOB: [birth year] DOA: 10/10/2020 PCP: Fanny Bien, MD   Chief Complain:Wound infection  Brief Narrative: Patient is a 55 year old female with PMH of diabetes mellitus type 2, essential hypertension, chronic lymphedema as well as other comorbidities who was admitted  skin breakdown and  was  infected wound on bilateral LE.  She is currently nonambulatory and lives alone and unable to care for self.  She has completed antibiotics and has been deemed medically stable to be discharged however disposition is difficult given her nonambulatory status and because SNF requires the patient to be under 450 pounds for her bariatric bed.TOC is following.  Prolonged hospital stay  Assessment & Plan:   Principal Problem:   Wound infection Active Problems:   Hyperkalemia   Chronic peripheral venous hypertension with lower extremity complication   Lymphedema   Obesity   Type 2 diabetes mellitus (HCC)   HTN (hypertension)   Cellulitis  Chronic bilateral lower extremity wounds/ lymphedema: -h/o chronic unhealing ulcers, she has chronic leg wound .She was being followed by wound care Dr. Quentin Cornwall since 2018 -On presentation patient noted to have increased pain, foul-smelling discharge from wounds -she was treated with broad spectrum abx -Currently the Wounds are pink and clean, no odor   Chronic Pain syndrome -C/w Oxycodone 10 mg q4hprn Breakthrough Pain, Diclofenac 2 gram topically 4 times daily PRN, Acetaminophen 1000 mg po qHS and Acetaminophen 650 mg po q6hprn   Noninsulin-dependent type 2 diabetes Last A1c 6.5 She is currently on metformin and SSI   Hypomagnesemia On oral mag supplement, monitored intermittently   Hypertension Currently on metoprolol BP stable  Debility/deconditioning Due to morbid obesity.  She is hardly able to stand.  She had been sleeping in her lift chair since December last year.  She lives alone.  Needs  skilled nursing facility on DC  Disposition: Difficult placement given her nonambulatory status,likely custodial care  and because SNF requires the patient to be under 450 pounds for her bariatric bed.TOC is following  Super morbid obesity: BMI of 67.6  Pressure Injury 10/11/20 Thigh Left;Posterior (Active)  10/11/20 0041  Location: Thigh  Location Orientation: Left;Posterior  Staging:   Wound Description (Comments):   Present on Admission:      Pressure Injury 10/11/20 Tibial Left;Posterior;Proximal (Active)  10/11/20 0041  Location: Tibial  Location Orientation: Left;Posterior;Proximal  Staging:   Wound Description (Comments):   Present on Admission:             Nutrition Problem: Increased nutrient needs Etiology: wound healing      DVT prophylaxis:Xarelto Code Status: Full Family Communication: None at bedside Status is: Inpatient  Remains inpatient appropriate because:Unsafe d/c plan  Dispo: The patient is from: Home              Anticipated d/c is to: SNF              Patient currently is medically stable to d/c.   Difficult to place patient Yes   Consultants: None  Procedures:None  Antimicrobials:  Anti-infectives (From admission, onward)    Start     Dose/Rate Route Frequency Ordered Stop   10/11/20 1000  vancomycin (VANCOREADY) IVPB 2000 mg/400 mL  Status:  Discontinued        2,000 mg 200 mL/hr over 120 Minutes Intravenous Every 12 hours 10/10/20 2047 10/11/20 1140   10/11/20 0100  ceFEPIme (MAXIPIME) 2 g in sodium chloride 0.9 % 100 mL IVPB  Status:  Discontinued        2 g 200 mL/hr over 30 Minutes Intravenous Every 8 hours 10/10/20 2001 10/11/20 1140   10/10/20 2100  vancomycin (VANCOCIN) 2,500 mg in sodium chloride 0.9 % 500 mL IVPB        2,500 mg 250 mL/hr over 120 Minutes Intravenous  Once 10/10/20 2001 10/11/20 0135   10/10/20 1545  ceFAZolin (ANCEF) IVPB 2g/100 mL premix        2 g 200 mL/hr over 30 Minutes Intravenous  Once  10/10/20 1530 10/10/20 1723       Subjective:  Patient seen and examined the bedside this morning.  Hemodynamically stable.  Lying on bed as always.  Denies any new complaints.   Objective: Vitals:   12/24/20 1900 12/24/20 2122 12/25/20 0500 12/25/20 0548  BP:  121/75  121/72  Pulse:  (!) 102  91  Resp:  16  18  Temp:  100 F (37.8 C)  97.7 F (36.5 C)  TempSrc:  Oral  Oral  SpO2:  95%  94%  Weight: (!) 202 kg  (!) 209.1 kg   Height:        Intake/Output Summary (Last 24 hours) at 12/25/2020 1256 Last data filed at 12/25/2020 1149 Gross per 24 hour  Intake 860 ml  Output 1701 ml  Net -841 ml   Filed Weights   12/24/20 0540 12/24/20 1900 12/25/20 0500  Weight: (!) 206 kg (!) 202 kg (!) 209.1 kg    Examination: General exam: Comfortable, lying in bed, playing with her phone  Data Reviewed: I have personally reviewed following labs and imaging studies  CBC: No results for input(s): WBC, NEUTROABS, HGB, HCT, MCV, PLT in the last 168 hours.  Basic Metabolic Panel: Recent Labs  Lab 12/22/20 0249  MG 1.7   GFR: Estimated Creatinine Clearance: 161.9 mL/min (by C-G formula based on SCr of 0.55 mg/dL). Liver Function Tests: No results for input(s): AST, ALT, ALKPHOS, BILITOT, PROT, ALBUMIN in the last 168 hours.  No results for input(s): LIPASE, AMYLASE in the last 168 hours. No results for input(s): AMMONIA in the last 168 hours. Coagulation Profile: No results for input(s): INR, PROTIME in the last 168 hours. Cardiac Enzymes: No results for input(s): CKTOTAL, CKMB, CKMBINDEX, TROPONINI in the last 168 hours. BNP (last 3 results) No results for input(s): PROBNP in the last 8760 hours. HbA1C: No results for input(s): HGBA1C in the last 72 hours. CBG: Recent Labs  Lab 12/24/20 1158 12/24/20 1713 12/24/20 2235 12/25/20 0750 12/25/20 1146  GLUCAP 198* 148* 160* 175* 169*   Lipid Profile: No results for input(s): CHOL, HDL, LDLCALC, TRIG, CHOLHDL, LDLDIRECT  in the last 72 hours. Thyroid Function Tests: No results for input(s): TSH, T4TOTAL, FREET4, T3FREE, THYROIDAB in the last 72 hours. Anemia Panel: No results for input(s): VITAMINB12, FOLATE, FERRITIN, TIBC, IRON, RETICCTPCT in the last 72 hours. Sepsis Labs: No results for input(s): PROCALCITON, LATICACIDVEN in the last 168 hours.  No results found for this or any previous visit (from the past 240 hour(s)).       Radiology Studies: No results found.      Scheduled Meds:  acetaminophen  1,000 mg Oral QHS   ferrous sulfate  325 mg Oral Daily   hydrocortisone cream   Topical BID   hydrOXYzine  10 mg Oral TID   loratadine  10 mg Oral QPM   magnesium oxide  800 mg Oral BID   metFORMIN  500 mg Oral BID  WC   metoprolol tartrate  12.5 mg Oral BID   nutrition supplement (JUVEN)  1 packet Oral BID BM   Ensure Max Protein  11 oz Oral BID   rivaroxaban  10 mg Oral Daily   senna  1 tablet Oral BID   Continuous Infusions:  sodium chloride Stopped (11/15/20 1816)     LOS: 69 days    Time spent:15 mins. More than 50% of that time was spent in counseling and/or coordination of care.      Shelly Coss, MD [facility] P7/11/2020, 12:56 PM

## 2020-12-26 LAB — GLUCOSE, CAPILLARY
Glucose-Capillary: 157 mg/dL — ABNORMAL HIGH (ref 70–99)
Glucose-Capillary: 213 mg/dL — ABNORMAL HIGH (ref 70–99)
Glucose-Capillary: 150 mg/dL — ABNORMAL HIGH (ref 70–99)
Glucose-Capillary: 182 mg/dL — ABNORMAL HIGH (ref 70–99)

## 2020-12-26 LAB — SARS CORONAVIRUS 2 (TAT 6-24 HRS): SARS Coronavirus 2: NEGATIVE

## 2020-12-26 MED ORDER — BISACODYL 10 MG RE SUPP: 10.0000 mg | Freq: Every day | RECTAL | 0 refills | Status: AC | PRN

## 2020-12-26 MED ORDER — MAGNESIUM OXIDE -MG SUPPLEMENT 400 (240 MG) MG PO TABS: 800.0000 mg | ORAL_TABLET | Freq: Two times a day (BID) | ORAL | Status: AC

## 2020-12-26 MED ORDER — METFORMIN HCL ER 500 MG PO TB24: 500.0000 mg | ORAL_TABLET | Freq: Two times a day (BID) | ORAL | Status: AC

## 2020-12-26 MED ORDER — OXYCODONE HCL 10 MG PO TABS: 10.0000 mg | ORAL_TABLET | ORAL | 0 refills | Status: AC | PRN

## 2020-12-26 MED ORDER — LORATADINE 10 MG PO TABS: 10.0000 mg | ORAL_TABLET | Freq: Every evening | ORAL | Status: AC

## 2020-12-26 MED ORDER — METHOCARBAMOL 500 MG PO TABS: 1000.0000 mg | ORAL_TABLET | Freq: Four times a day (QID) | ORAL | 0 refills | Status: AC | PRN

## 2020-12-26 MED ORDER — RIVAROXABAN 10 MG PO TABS: 10.0000 mg | ORAL_TABLET | Freq: Every day | ORAL | Status: AC

## 2020-12-26 MED ORDER — ACETAMINOPHEN 500 MG PO TABS: 1000.0000 mg | ORAL_TABLET | Freq: Every day | ORAL | 0 refills | Status: AC

## 2020-12-26 MED ORDER — HYDROCORTISONE 1 % EX CREA
TOPICAL_CREAM | Freq: Two times a day (BID) | CUTANEOUS | 0 refills | Status: AC
Start: 2020-12-26 — End: ?

## 2020-12-26 MED ORDER — METOPROLOL TARTRATE 25 MG PO TABS: 12.5000 mg | ORAL_TABLET | Freq: Two times a day (BID) | ORAL | Status: AC

## 2020-12-26 MED ORDER — HYDROXYZINE HCL 10 MG PO TABS
10.0000 mg | ORAL_TABLET | Freq: Three times a day (TID) | ORAL | 0 refills | Status: AC
Start: 2020-12-26 — End: ?

## 2020-12-26 MED ORDER — ACETAMINOPHEN 325 MG PO TABS
650.0000 mg | ORAL_TABLET | Freq: Four times a day (QID) | ORAL | Status: AC | PRN
Start: 2020-12-26 — End: ?

## 2020-12-26 MED ORDER — DICLOFENAC SODIUM 1 % EX GEL: 2.0000 g | Freq: Four times a day (QID) | CUTANEOUS | Status: AC | PRN

## 2020-12-26 NOTE — TOC Progression Note (Signed)
Transition of Care [facility]) - Progression Note    Patient Details  Name: [name] MRN: [id number] Date of Birth: 01/20/1966  [facility] CM/SW Contact  Lennart Pall, LCSW Phone Number: 12/26/2020, 3:00 PM  Clinical Narrative:    Continue to await insurance approval.  Notified by admissions at SNF that they will plan for admission tomorrow.  Will alert MD and RN.  Pt aware.   Expected Discharge Plan: Skilled Nursing Facility Barriers to Discharge: Insurance Authorization  Expected Discharge Plan and Services Expected Discharge Plan: [place] In-house Referral: Clinical Social Work Discharge Planning Services: CM Consult Post Acute Care Choice: [place] Living arrangements for the past 2 months: Apartment Expected Discharge Date: 12/26/20                                     Social Determinants of Health (SDOH) Interventions    Readmission Risk Interventions No flowsheet data found.

## 2020-12-26 NOTE — Plan of Care (Signed)

## 2020-12-26 NOTE — Discharge Summary (Addendum)
Physician Discharge Summary  Sierra AYDELOTT KVQ:[id number] DOB: Aug 27, 1965 DOA: 10/10/2020  PCP: Fanny Bien, MD  Admit date: 10/10/2020 Discharge date: 12/26/2020  Admitted From: Home Disposition: Skilled nursing facility  Recommendations for Outpatient Follow-up:  Follow up with PCP in 1-2 weeks Please obtain BMP/CBC in one week Please follow up on the following pending results:  Home Health: Not applicable Equipment/Devices: Not applicable  Discharge Condition: Stable CODE STATUS: Full code Diet recommendation: Low-salt low-carb diet  Brief/Interim Summary: Patient is in the hospital for 76 days. She is going to a skilled nursing rehab today.  History and previous hospital course as noted on records below.  Patient is a 55 year old female with PMH of diabetes mellitus type 2, essential hypertension, chronic lymphedema as well as other comorbidities who was admitted  skin breakdown and  was  infected wound on bilateral LE.  She is currently nonambulatory and lives alone and unable to care for self.  She has completed antibiotics and has been deemed medically stable to be discharged however remained in the hospital because of nonambulatory status and unable to take care of herself.  Treated for following conditions.   Assessment & Plan of care:   Chronic bilateral lower extremity wounds/ lymphedema: -h/o chronic nonhealing ulcers, she has chronic leg wound .She was being followed by wound care Dr. Quentin Cornwall since 2018 -On presentation patient noted to have increased pain, foul-smelling discharge from wounds -she was treated with broad spectrum abx -Currently the Wounds are pink and clean, no odor -Daily wound care instructions as below.   Chronic Pain syndrome -C/w Oxycodone 10 mg q4hprn Breakthrough Pain, Diclofenac 2 gram topically 4 times daily PRN, Acetaminophen 1000 mg po qHS and Acetaminophen 650 mg po q6hprn   Noninsulin-dependent type 2 diabetes Last A1c  6.5 She is currently on metformin and SSI   Hypomagnesemia On oral mag supplement, monitored intermittently   Hypertension Currently on metoprolol BP stable   Debility/deconditioning Due to morbid obesity.  She is hardly able to stand.  She had been sleeping in her lift chair since December last year.  She lives alone.     Super morbid obesity: BMI of 67.6  Discharge Diagnoses:  Principal Problem:   Wound infection Active Problems:   Hyperkalemia   Chronic peripheral venous hypertension with lower extremity complication   Lymphedema   Obesity   Type 2 diabetes mellitus (HCC)   HTN (hypertension)   Cellulitis    Discharge Instructions  Discharge Instructions     Diet - low sodium heart healthy   Complete by: As directed    Diet Carb Modified   Complete by: As directed    Discharge wound care:   Complete by: As directed    Clean wounds of the bilateral LEs with saline, pat dry.  Apply full or cut to fit sheets of silver hydrofiber Kellie Simmering [phone]) over open ulcerations. Top with ABD pads secure with tape if needed.   Increase activity slowly   Complete by: As directed       Allergies as of 12/26/2020   No Known Allergies      Medication List     STOP taking these medications    acetaminophen 650 MG CR tablet Commonly known as: TYLENOL Replaced by: acetaminophen 500 MG tablet You also have another medication with the same name that you need to continue taking as instructed.   diphenhydramine-acetaminophen 25-500 MG Tabs tablet Commonly known as: TYLENOL PM   ibuprofen 200 MG tablet  Commonly known as: ADVIL       TAKE these medications    acetaminophen 325 MG tablet Commonly known as: TYLENOL Take 2 tablets (650 mg total) by mouth every 6 (six) hours as needed for mild pain. What changed:  how much to take when to take this reasons to take this additional instructions Another medication with the same name was removed. Continue taking this  medication, and follow the directions you see here.   acetaminophen 500 MG tablet Commonly known as: TYLENOL Take 2 tablets (1,000 mg total) by mouth at bedtime. What changed: You were already taking a medication with the same name, and this prescription was added. Make sure you understand how and when to take each. Replaces: acetaminophen 650 MG CR tablet   bisacodyl 10 MG suppository Commonly known as: DULCOLAX Place 1 suppository (10 mg total) rectally daily as needed for moderate constipation.   diclofenac Sodium 1 % Gel Commonly known as: VOLTAREN Apply 2 g topically 4 (four) times daily as needed (pain).   ferrous sulfate 325 (65 FE) MG tablet Take 325 mg by mouth daily.   hydrocortisone cream 1 % Apply topically 2 (two) times daily.   hydrOXYzine 10 MG tablet Commonly known as: ATARAX/VISTARIL Take 1 tablet (10 mg total) by mouth 3 (three) times daily.   loratadine 10 MG tablet Commonly known as: CLARITIN Take 1 tablet (10 mg total) by mouth every evening.   magnesium oxide 400 (240 Mg) MG tablet Commonly known as: MAG-OX Take 2 tablets (800 mg total) by mouth 2 (two) times daily.   metFORMIN 500 MG 24 hr tablet Commonly known as: GLUCOPHAGE-XR Take 1 tablet (500 mg total) by mouth 2 (two) times daily with a meal.   methocarbamol 500 MG tablet Commonly known as: ROBAXIN Take 2 tablets (1,000 mg total) by mouth every 6 (six) hours as needed for up to 5 days for muscle spasms.   metoprolol tartrate 25 MG tablet Commonly known as: LOPRESSOR Take 0.5 tablets (12.5 mg total) by mouth 2 (two) times daily.   Oxycodone HCl 10 MG Tabs Take 1 tablet (10 mg total) by mouth every 4 (four) hours as needed for up to 5 days for breakthrough pain.   rivaroxaban 10 MG Tabs tablet Commonly known as: XARELTO Take 1 tablet (10 mg total) by mouth daily.   sitaGLIPtin 100 MG tablet Commonly known as: JANUVIA Take 1 tablet (100 mg total) by mouth daily.                Discharge Care Instructions  (From admission, onward)           Start     Ordered   12/26/20 0000  Discharge wound care:       Comments: Clean wounds of the bilateral LEs with saline, pat dry.  Apply full or cut to fit sheets of silver hydrofiber Kellie Simmering [phone]) over open ulcerations. Top with ABD pads secure with tape if needed.   12/26/20 0858            Contact information for after-discharge care     [facility] .   Service: Skilled Nursing Contact information: [place] [phone]                    No Known Allergies  Consultations: Wound care   Procedures/Studies: No results found. (Echo, Carotid, EGD, Colonoscopy, ERCP)    Subjective: Patient  seen and examined.  No overnight events.  Patient is not very satisfied with the skilled nursing facility of where she has, however she knows that she has to give a trial.   Discharge Exam: Vitals:   12/25/20 2056 12/26/20 0539  BP: 134/72 138/78  Pulse: (!) 103 98  Resp: 15 16  Temp: 98.3 F (36.8 C) 98.2 F (36.8 C)  SpO2: 93% 93%   Vitals:   12/25/20 0548 12/25/20 1321 12/25/20 2056 12/26/20 0539  BP: 121/72 131/78 134/72 138/78  Pulse: 91 96 (!) 103 98  Resp: 18 18 15 16   Temp: 97.7 F (36.5 C) 98.2 F (36.8 C) 98.3 F (36.8 C) 98.2 F (36.8 C)  TempSrc: Oral  Oral Oral  SpO2: 94% 91% 93% 93%  Weight:      Height:        General: Pt is alert, awake, not in acute distress, on room air.  Sleeping in bed. Cardiovascular: RRR, S1/S2 +, no rubs, no gallops Respiratory: CTA bilaterally, no wheezing, no rhonchi Abdominal: Soft, NT, ND, bowel sounds + Extremities: Chronic venous stasis changes bilateral. Left posterior thigh and left anterior shin with clean healing ulcers.    The results of significant diagnostics from this hospitalization (including imaging, microbiology, ancillary and laboratory)  are listed below for reference.     Microbiology: No results found for this or any previous visit (from the past 240 hour(s)).   Labs: BNP (last 3 results) No results for input(s): BNP in the last 8760 hours. Basic Metabolic Panel: Recent Labs  Lab 12/22/20 0249  MG 1.7   Liver Function Tests: No results for input(s): AST, ALT, ALKPHOS, BILITOT, PROT, ALBUMIN in the last 168 hours. No results for input(s): LIPASE, AMYLASE in the last 168 hours. No results for input(s): AMMONIA in the last 168 hours. CBC: No results for input(s): WBC, NEUTROABS, HGB, HCT, MCV, PLT in the last 168 hours. Cardiac Enzymes: No results for input(s): CKTOTAL, CKMB, CKMBINDEX, TROPONINI in the last 168 hours. BNP: Invalid input(s): POCBNP CBG: Recent Labs  Lab 12/25/20 0750 12/25/20 1146 12/25/20 1641 12/25/20 2057 12/26/20 0714  GLUCAP 175* 169* 160* 166* 157*   D-Dimer No results for input(s): DDIMER in the last 72 hours. Hgb A1c No results for input(s): HGBA1C in the last 72 hours. Lipid Profile No results for input(s): CHOL, HDL, LDLCALC, TRIG, CHOLHDL, LDLDIRECT in the last 72 hours. Thyroid function studies No results for input(s): TSH, T4TOTAL, T3FREE, THYROIDAB in the last 72 hours.  Invalid input(s): FREET3 Anemia work up No results for input(s): VITAMINB12, FOLATE, FERRITIN, TIBC, IRON, RETICCTPCT in the last 72 hours. Urinalysis    Component Value Date/Time   COLORURINE STRAW (A) 10/11/2020 1151   APPEARANCEUR CLEAR 10/11/2020 1151   LABSPEC 1.009 10/11/2020 1151   PHURINE 5.0 10/11/2020 1151   GLUCOSEU NEGATIVE 10/11/2020 1151   HGBUR LARGE (A) 10/11/2020 1151   BILIRUBINUR NEGATIVE 10/11/2020 1151   KETONESUR NEGATIVE 10/11/2020 1151   PROTEINUR NEGATIVE 10/11/2020 1151   UROBILINOGEN 0.2 08/13/2011 0624   NITRITE NEGATIVE 10/11/2020 1151   LEUKOCYTESUR TRACE (A) 10/11/2020 1151   Sepsis Labs Invalid input(s): PROCALCITONIN,  WBC,  LACTICIDVEN Microbiology No  results found for this or any previous visit (from the past 240 hour(s)).   Time coordinating discharge:  35 minutes  SIGNED:   Barb Merino, MD  [facility] 12/26/2020, 8:59 AM   Patient seen and examined. No change needed in above plan of care. Discharge to SNF today when  they are able to accept her.

## 2020-12-26 NOTE — Progress Notes (Signed)
Physical Therapy Treatment Patient Details Name: [name] MRN: [id number] DOB: 1965-06-26 Today's Date: 12/26/2020    History of Present Illness Pt admitted from home with multiple LE wounds, and FTT and unable to perform basic self care.  Pt reports she stayed in lift chair getting up only twice a day for BM or getting food.  Pt utilizing Rollator around home but pivoted to it from lift chair and wheeled around in seated position.  Pt states she put pads and diapers in chair so she did not have to get up to urinate.  Pt with hx of DM and lymphadema (has been unable to use her pressure sleeves so lymphadema has significantly worsened since last December).    PT Comments    The patient is eager to tilt on the bed today. Tolerated repositioning Legs with mod assistance. Patient tilted to 22 degrees,  patient able to adjust legs so feet were flat on tilt board. Secured  straps and tilted bed to 35 * 31 lbs weight bearing,  tolerated total tilt time for 15 minutes. Continue tilting as tolerated to increased tolerance of WB, Also recommended  to patient to place  bed in as much chair position as possible to increase hip flexion tolerance.  Follow Up Recommendations  SNF     Equipment Recommendations  Wheelchair (measurements PT);Wheelchair cushion (measurements PT)    Recommendations for Other Services       Precautions / Restrictions Precautions Precautions: Fall Precaution Comments: posterior and medial LLE wounds, place bed pad  to cushion when maxiskying. Restrictions Weight Bearing Restrictions: No    Mobility  Bed Mobility               General bed mobility comments: (P) NT Start Time: 1140 Angle: 35 degrees (from 25 up to 35*) Total Minutes in Angle: 15 minutes  Transfers Overall transfer level: (P) Needs assistance                  Ambulation/Gait                 Stairs             Wheelchair Mobility    Modified Rankin (Stroke  Patients Only)       Balance                                            Cognition Arousal/Alertness: Awake/alert Behavior During Therapy: WFL for tasks assessed/performed;Anxious                                          Exercises      General Comments General comments (skin integrity, edema, etc.): NT      Pertinent Vitals/Pain Faces Pain Scale: Hurts whole lot Pain Location: both knes and hips when tilted to 35* Pain Descriptors / Indicators: Discomfort Pain Intervention(s): Monitored during session;Premedicated before session;Heat applied    Home Living                      Prior Function            PT Goals (current goals can now be found in the care plan section) Acute Rehab PT Goals Patient Stated Goal: manage myself and go home PT  Goal Formulation: With patient Time For Goal Achievement: 01/09/21 Potential to Achieve Goals: Fair Additional Goals Additional Goal #1: tolerate tilt standing bed x 20 mins at 50 degrees. Progress towards PT goals: Progressing toward goals    Frequency    Min 2X/week      PT Plan Current plan remains appropriate    Co-evaluation              AM-PAC PT "6 Clicks" Mobility   Outcome Measure  Help needed turning from your back to your side while in a flat bed without using bedrails?: Total Help needed moving from lying on your back to sitting on the side of a flat bed without using bedrails?: Total Help needed moving to and from a bed to a chair (including a wheelchair)?: Total Help needed standing up from a chair using your arms (e.g., wheelchair or bedside chair)?: Total Help needed to walk in hospital room?: Total Help needed climbing 3-5 steps with a railing? : Total 6 Click Score: 6    End of Session   Activity Tolerance: Patient tolerated treatment well Patient left: in bed;with call bell/phone within reach Nurse Communication: Mobility status;Need for lift  equipment PT Visit Diagnosis: Muscle weakness (generalized) (M62.81);Difficulty in walking, not elsewhere classified (R26.2);Pain;Adult, failure to thrive (R62.7) Pain - Right/Left: Right Pain - part of body: Knee;Leg;Hip     Time: 1120-1206 PT Time Calculation (min) (ACUTE ONLY): 46 min  Charges:  $Therapeutic Activity: 38-52 mins                    [name] PT Acute Rehabilitation Services Pager [phone] Office [phone]   [name] 12/26/2020, 12:42 PM

## 2020-12-27 LAB — GLUCOSE, CAPILLARY
Glucose-Capillary: 194 mg/dL — ABNORMAL HIGH (ref 70–99)
Glucose-Capillary: 214 mg/dL — ABNORMAL HIGH (ref 70–99)
Glucose-Capillary: 133 mg/dL — ABNORMAL HIGH (ref 70–99)
Glucose-Capillary: 174 mg/dL — ABNORMAL HIGH (ref 70–99)

## 2020-12-27 NOTE — Plan of Care (Signed)
  Problem: Health Behavior/Discharge Planning: Goal: Ability to manage health-related needs will improve Outcome: Progressing   Problem: Clinical Measurements: Goal: Ability to maintain clinical measurements within normal limits will improve Outcome: Progressing   Problem: Nutrition: Goal: Adequate nutrition will be maintained Outcome: Progressing   Problem: Coping: Goal: Level of anxiety will decrease Outcome: Progressing   Problem: Pain Managment: Goal: General experience of comfort will improve Outcome: Progressing   Problem: Safety: Goal: Ability to remain free from injury will improve Outcome: Progressing   Problem: Skin Integrity: Goal: Risk for impaired skin integrity will decrease Outcome: Progressing   

## 2020-12-27 NOTE — Progress Notes (Signed)
Patient seen and examined. No changes in clinical status. Med rec reviewed and no changes needed. Stable to transfer to SNF today.  Physical Exam Constitutional:      Appearance: Normal appearance. She is obese.  HENT:     Head: Normocephalic.  Cardiovascular:     Rate and Rhythm: Regular rhythm.     Heart sounds: Normal heart sounds.  Pulmonary:     Breath sounds: Normal breath sounds.  Abdominal:     Palpations: Abdomen is soft.  Musculoskeletal:     Left lower leg: Edema present.     Comments: Bilateral chronic stasis changes and healing ulcers left thigh and shin, clear and dry   Neurological:     General: No focal deficit present.     Mental Status: She is alert and oriented to person, place, and time.     Total time spent : 15 minutes

## 2020-12-27 NOTE — TOC Transition Note (Signed)
Transition of Care [facility]) - CM/SW Discharge Note   Patient Details  Name: [name] MRN: [id number] Date of Birth: [birth year]  Transition of Care [facility]) CM/SW Contact:  Lennart Pall, LCSW Phone Number: 12/27/2020, 1:23 PM   Clinical Narrative:     Have received insurance authorization for SNF bed at [facility] and pt medically cleared for dc today. Pt aware and agreeable.  PTAR called at 1:10pm.  RN to call report to [phone].  No further TOC needs.  Final next level of care: Skilled Nursing Facility Barriers to Discharge: Barriers Resolved   Patient Goals and CMS Choice Patient states their goals for this hospitalization and ongoing recovery are:: want to be more mobile and independent in my home CMS Medicare.gov Compare Post Acute Care list provided to:: Patient Choice offered to / list presented to : Patient  Discharge Placement              [facility] chooses bed at: [facility] Patient to be transferred to facility by: [facility] Name of family member notified: aunt Patient and family notified of of transfer: 12/26/20  Discharge Plan and Services In-house Referral: Clinical Social Work Discharge Planning Services: [employer] Consult Post Acute Care Choice: [place]          DME Arranged: N/A DME Agency: NA                  Social Determinants of Health (SDOH) Interventions     Readmission Risk Interventions No flowsheet data found.

## 2020-12-27 NOTE — Progress Notes (Signed)
Pt discharged to [facility] via PT[facility].  VSS, no apparent distress.  Belongings packed and sent with the patient.

## 2020-12-27 NOTE — Progress Notes (Signed)
Called facility, gave report to Ivin Booty, all questions answered. Pt not in distress, to discharge to facility with belongings via [place].

## 2021-02-28 ENCOUNTER — Ambulatory Visit: Payer: Medicare PPO | Attending: Family

## 2021-02-28 DIAGNOSIS — Z23 Encounter for immunization: Secondary | ICD-10-CM

## 2021-02-28 NOTE — Progress Notes (Signed)
   Covid-19 Vaccination Clinic  Name:  KAYBRIE OZGA    MRN: [id number] DOB: 19-Nov-1965  02/28/2021  Ms. Frogge was observed post Covid-19 immunization for 15 minutes without incident. She was provided with Vaccine Information Sheet and instruction to access the V-Safe system.   Ms. Racy was instructed to call 911 with any severe reactions post vaccine: Difficulty breathing  Swelling of face and throat  A fast heartbeat  A bad rash all over body  Dizziness and weakness   Immunizations Administered     Name Date Dose VIS Date Route   Moderna COVID-19 Vaccine 02/28/2021  9:00 AM 0.25 mL 04/10/2020 Intramuscular   Manufacturer: Moderna   Lot: [id number]   [id number]

## 2021-06-27 DIAGNOSIS — M545 Low back pain, unspecified: Secondary | ICD-10-CM | POA: Diagnosis not present

## 2021-06-27 DIAGNOSIS — M546 Pain in thoracic spine: Secondary | ICD-10-CM | POA: Diagnosis not present

## 2021-06-27 DIAGNOSIS — M25551 Pain in right hip: Secondary | ICD-10-CM | POA: Diagnosis not present

## 2021-06-27 DIAGNOSIS — M25552 Pain in left hip: Secondary | ICD-10-CM | POA: Diagnosis not present

## 2021-06-27 DIAGNOSIS — G8929 Other chronic pain: Secondary | ICD-10-CM | POA: Diagnosis not present

## 2021-07-18 DIAGNOSIS — M170 Bilateral primary osteoarthritis of knee: Secondary | ICD-10-CM | POA: Diagnosis not present

## 2021-07-18 DIAGNOSIS — G894 Chronic pain syndrome: Secondary | ICD-10-CM | POA: Diagnosis not present

## 2021-07-18 DIAGNOSIS — I890 Lymphedema, not elsewhere classified: Secondary | ICD-10-CM | POA: Diagnosis not present

## 2021-07-20 DIAGNOSIS — E11622 Type 2 diabetes mellitus with other skin ulcer: Secondary | ICD-10-CM | POA: Diagnosis not present

## 2021-08-08 DIAGNOSIS — M546 Pain in thoracic spine: Secondary | ICD-10-CM | POA: Diagnosis not present

## 2021-08-08 DIAGNOSIS — M25552 Pain in left hip: Secondary | ICD-10-CM | POA: Diagnosis not present

## 2021-08-08 DIAGNOSIS — M25551 Pain in right hip: Secondary | ICD-10-CM | POA: Diagnosis not present

## 2021-08-08 DIAGNOSIS — G8929 Other chronic pain: Secondary | ICD-10-CM | POA: Diagnosis not present

## 2021-08-08 DIAGNOSIS — M545 Low back pain, unspecified: Secondary | ICD-10-CM | POA: Diagnosis not present

## 2021-08-19 DIAGNOSIS — E11622 Type 2 diabetes mellitus with other skin ulcer: Secondary | ICD-10-CM | POA: Diagnosis not present

## 2021-09-05 DIAGNOSIS — M25552 Pain in left hip: Secondary | ICD-10-CM | POA: Diagnosis not present

## 2021-09-05 DIAGNOSIS — M25551 Pain in right hip: Secondary | ICD-10-CM | POA: Diagnosis not present

## 2021-09-05 DIAGNOSIS — G8929 Other chronic pain: Secondary | ICD-10-CM | POA: Diagnosis not present

## 2021-09-05 DIAGNOSIS — M545 Low back pain, unspecified: Secondary | ICD-10-CM | POA: Diagnosis not present

## 2021-09-05 DIAGNOSIS — M546 Pain in thoracic spine: Secondary | ICD-10-CM | POA: Diagnosis not present

## 2021-09-19 DIAGNOSIS — E11622 Type 2 diabetes mellitus with other skin ulcer: Secondary | ICD-10-CM | POA: Diagnosis not present

## 2021-11-21 DIAGNOSIS — M546 Pain in thoracic spine: Secondary | ICD-10-CM | POA: Diagnosis not present

## 2021-11-21 DIAGNOSIS — M545 Low back pain, unspecified: Secondary | ICD-10-CM | POA: Diagnosis not present

## 2021-11-21 DIAGNOSIS — G8929 Other chronic pain: Secondary | ICD-10-CM | POA: Diagnosis not present

## 2021-11-21 DIAGNOSIS — M25551 Pain in right hip: Secondary | ICD-10-CM | POA: Diagnosis not present

## 2021-11-21 DIAGNOSIS — M25552 Pain in left hip: Secondary | ICD-10-CM | POA: Diagnosis not present

## 2022-01-01 DIAGNOSIS — M25551 Pain in right hip: Secondary | ICD-10-CM | POA: Diagnosis not present

## 2022-01-01 DIAGNOSIS — M545 Low back pain, unspecified: Secondary | ICD-10-CM | POA: Diagnosis not present

## 2022-01-01 DIAGNOSIS — G8929 Other chronic pain: Secondary | ICD-10-CM | POA: Diagnosis not present

## 2022-01-01 DIAGNOSIS — M546 Pain in thoracic spine: Secondary | ICD-10-CM | POA: Diagnosis not present

## 2022-01-01 DIAGNOSIS — M25552 Pain in left hip: Secondary | ICD-10-CM | POA: Diagnosis not present

## 2022-02-06 DIAGNOSIS — G894 Chronic pain syndrome: Secondary | ICD-10-CM | POA: Diagnosis not present

## 2022-02-06 DIAGNOSIS — Z79891 Long term (current) use of opiate analgesic: Secondary | ICD-10-CM | POA: Diagnosis not present

## 2022-02-09 DIAGNOSIS — Z7401 Bed confinement status: Secondary | ICD-10-CM | POA: Diagnosis not present

## 2022-02-11 DIAGNOSIS — R5381 Other malaise: Secondary | ICD-10-CM | POA: Diagnosis not present

## 2022-02-11 DIAGNOSIS — E119 Type 2 diabetes mellitus without complications: Secondary | ICD-10-CM | POA: Diagnosis not present

## 2022-02-11 DIAGNOSIS — Z7401 Bed confinement status: Secondary | ICD-10-CM | POA: Diagnosis not present
